# Patient Record
Sex: Male | Born: 1955 | Race: White | Hispanic: No | Marital: Married | Smoking: Former smoker
Health system: Southern US, Community
[De-identification: ages and names within clinical notes are randomized; demographics above are authoritative.]

## PROBLEM LIST (undated history)

## (undated) DIAGNOSIS — E785 Hyperlipidemia, unspecified: Secondary | ICD-10-CM

## (undated) DIAGNOSIS — C801 Malignant (primary) neoplasm, unspecified: Secondary | ICD-10-CM

## (undated) DIAGNOSIS — I10 Essential (primary) hypertension: Secondary | ICD-10-CM

## (undated) DIAGNOSIS — R2232 Localized swelling, mass and lump, left upper limb: Secondary | ICD-10-CM

## (undated) DIAGNOSIS — Z8551 Personal history of malignant neoplasm of bladder: Secondary | ICD-10-CM

## (undated) HISTORY — PX: WISDOM TOOTH EXTRACTION: SHX21

## (undated) HISTORY — PX: BLADDER SURGERY: SHX569

## (undated) HISTORY — DX: Hyperlipidemia, unspecified: E78.5

## (undated) HISTORY — PX: COLONOSCOPY: SHX174

## (undated) HISTORY — PX: CYSTECTOMY: SUR359

## (undated) HISTORY — PX: HERNIA REPAIR: SHX51

## (undated) NOTE — *Deleted (*Deleted)
Brian Benton presents today for follow-up after completing radiation to his pelvis on 04/13/2020  Pain: Fatigue: Appetite: *insert last 3 weights* Urinary: Bowel: Other issues of note: Met with Brian Benton (IR) to discuss left femoral DVT 04/30/2020 Plan: - CTA abdomen pelvis (venous protocol) to evaluate the pelvic venous anatomy - Repeat left leg duplex for DVT in the setting of severe and worsening post-thrombotic syndrome  -we will follow up with him once the imaging has been performed - continue current lovenox and anti-platelet strategy  *insert vitals*

## (undated) NOTE — *Deleted (*Deleted)
[facility] - Clinic Note  04/29/2020     CHIEF COMPLAINT Patient presents for No chief complaint on file.   HISTORY OF PRESENT ILLNESS: Brian Benton is a 58 y.o. male who presents to the clinic today for:   Patient states vision has improved some OS. Denies floaters, FOL. Increased dosage of cholesterol medication from 10 mg to 20mg . No side effects since increasing cholesterol medication. Sometimes forgets brimonidine gtt. (instructions to use bid)  Referring physician: Roseanna Rainbow, PA-C [address] [place],  [place] [address]  HISTORICAL INFORMATION:   Selected notes from the MEDICAL RECORD NUMBER Referred by [facility] for concern of BRAO LEE: 04.15.20 () [BCVA: OD: OS:] Ocular Hx- PMH-bladder cancer    CURRENT MEDICATIONS: No current outpatient medications on file. (Ophthalmic Drugs)   No current facility-administered medications for this visit. (Ophthalmic Drugs)   Current Outpatient Medications (Other)  Medication Sig  . ASPIRIN ADULT LOW STRENGTH 81 MG EC tablet TAKE 1 TABLET BY MOUTH EVERY DAY (Patient taking differently: Take 81 mg by mouth daily. )  . dexamethasone (DECADRON) 4 MG tablet Take two tablets (8 mg total) by mouth daily.  Take daily times three days starting the day after Cisplatin chemotherapy.  Take with food.  . enoxaparin (LOVENOX) 80 MG/0.8ML injection INJECT 0.8 MLS (80 MG TOTAL) INTO THE SKIN EVERY 12 (TWELVE) HOURS.  . fentaNYL (DURAGESIC) 25 MCG/HR Place 1 patch onto the skin every 3 (three) days.  Marland Kitchen gabapentin (NEURONTIN) 600 MG tablet Take 1 tablet (600 mg total) by mouth in the morning, at noon, in the evening, and at bedtime.  Marland Kitchen LORazepam (ATIVAN) 0.5 MG tablet Take 1 tablet (0.5 mg total) by mouth every 6 (six) hours as needed for anxiety.  . meloxicam (MOBIC) 15 MG tablet Take 1 tablet (15 mg total) by mouth daily.  . ondansetron (ZOFRAN) 8 MG tablet Take 1 tablet (8 mg total) by mouth 2 (two)  times daily as needed for nausea or vomiting. Start on third day after Cisplatin chemotherapy  . orphenadrine (NORFLEX) 100 MG tablet Take 1 tablet (100 mg total) by mouth 2 (two) times daily.  . Oxycodone HCl 10 MG TABS Take 1 tablet (10 mg total) by mouth every 4 (four) hours as needed.  . pantoprazole (PROTONIX) 40 MG tablet Take 1 tablet (40 mg total) by mouth daily.  . prochlorperazine (COMPAZINE) 10 MG tablet Take 1 tablet (10 mg total) by mouth every 6 (six) hours as needed for nausea or vomiting.  Marland Kitchen zolpidem (AMBIEN) 10 MG tablet TAKE 1 TABLET (10 MG TOTAL) BY MOUTH AT BEDTIME AS NEEDED FOR SLEEP.   No current facility-administered medications for this visit. (Other)      REVIEW OF SYSTEMS:    ALLERGIES No Known Allergies  PAST MEDICAL HISTORY Past Medical History:  Diagnosis Date  . Cancer (HCC)    bladder  . History of bladder cancer    s/p  TURBT 07-25-2014  . Hyperlipidemia   . Hypertension   . Mass of left hand   . 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 BLADDER SURGERY     [place]  . COLONOSCOPY    . CYSTECTOMY    . CYSTOSCOPY WITH BIOPSY N/A 07/25/2014   Procedure: CYSTOSCOPY WITH BLADDER BIOPSY AND FULGERATION;  Surgeon: Garnett Farm, MD;  Location[facility];  Service: Urology;  Laterality: N/A;  . CYSTOSCOPY WITH BIOPSY N/A 11/28/2014   Procedure: CYSTOSCOPY WITH  BLADDER BIOPSY;  Surgeon: Ihor Gully, MD;  Location: [facility];  Service: Urology;  Laterality: N/A;  . HEMORRHOID SURGERY  03/15/2012   Procedure: HEMORRHOIDECTOMY;  Surgeon: Clovis Pu. Cornett, MD;  Location: [facility];  Service: General;  Laterality: N/A;  . HERNIA REPAIR    . INGUINAL HERNIA REPAIR Bilateral 2006  . INTRAVASCULAR ULTRASOUND/IVUS N/A 03/13/2020   Procedure: Intravascular Ultrasound/IVUS;  Surgeon: Sherren Kerns, MD;  Location: [facility] INVASIVE CV LAB;  Service: Cardiovascular;   Laterality: N/A;  . IVC VENOGRAPHY N/A 03/13/2020   Procedure: IVC Venography;  Surgeon: Sherren Kerns, MD;  Location: [facility] INVASIVE CV LAB;  Service: Cardiovascular;  Laterality: N/A;  . LOWER EXTREMITY VENOGRAPHY Left 03/13/2020   Procedure: LOWER EXTREMITY VENOGRAPHY;  Surgeon: Sherren Kerns, MD;  Location: [facility] INVASIVE CV LAB;  Service: Cardiovascular;  Laterality: Left;  Marland Kitchen MASS EXCISION Left 11/12/2018   Procedure: EXCISION MASS LEFT HAND;  Surgeon: Cindee Salt, MD;  Location: [facility];  Service: Orthopedics;  Laterality: Left;  FAB  . PERIPHERAL VASCULAR INTERVENTION  03/13/2020   Procedure: PERIPHERAL VASCULAR INTERVENTION;  Surgeon: Sherren Kerns, MD;  Location[facility] INVASIVE CV LAB;  Service: Cardiovascular;;  . PERIPHERAL VASCULAR THROMBECTOMY N/A 03/13/2020   Procedure: PERIPHERAL VASCULAR THROMBECTOMY;  Surgeon: Sherren Kerns, MD;  Location: [facility] INVASIVE CV LAB;  Service: Cardiovascular;  Laterality: N/A;  . RADIOLOGY WITH ANESTHESIA N/A 02/20/2020   Procedure: MRI LUMBAR W/O CONTRAST  WITH ANESTHESIA;  Surgeon: Radiologist, Medication, MD;  Location: [facility];  Service: Radiology;  Laterality: N/A;  . TESTICLE SURGERY  2004   Ruptured Undescended Right testicle   . TRANSURETHRAL RESECTION OF BLADDER TUMOR WITH GYRUS (TURBT-GYRUS) N/A 05/23/2014   Procedure: TRANSURETHRAL RESECTION OF BLADDER TUMOR WITH GYRUS (TURBT-GYRUS);  Surgeon: Garnett Farm, MD;  Location: [facility];  Service: Urology;  Laterality: N/A;  . WISDOM TOOTH EXTRACTION      FAMILY HISTORY Family History  Problem Relation Age of Onset  . Cancer Mother        leukemia  . Diabetes Brother     SOCIAL HISTORY Social History   Tobacco Use  . Smoking status: Light Tobacco Smoker    Packs/day: 0.50    Years: 30.00    Pack years: 15.00    Types: Cigarettes    Last attempt to quit: 01/01/2019    Years since quitting: 1.3  . Smokeless tobacco: Never Used  Vaping Use  .  Vaping Use: Never used  Substance Use Topics  . Alcohol use: Not Currently    Comment: social  . Drug use: No         OPHTHALMIC EXAM:  Not recorded     IMAGING AND PROCEDURES  Imaging and Procedures for @TODAY @           ASSESSMENT/PLAN:    ICD-10-CM   1. 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 Retinal edema  H35.81   3. Essential hypertension  I10   4. Hypertensive retinopathy of both eyes  H35.033   5. Combined forms of age-related cataract of both eyes  H25.813   6. Visual field scotoma of left eye  H53.412     1,2. BRAO OS  - acute onset, Monday, 04.13.20, by pt history  - saw Dr. [place] on Wednesday, 04.15.20   - initial BCVA 20/40 with inferior  visual field loss OS  - on exam had mild whitening of superior macula with sparing of the fovea  - FA 4.17.2020 showed relatively good arterial perfusion, but delayed venous filling in ST quadrant  - HVF 24-2 today (02.17.21) shows inferior altitudinal defect--no significant change from prior (05.15.20)  - OCT shows stable improvement in inner-retinal hyperreflective material and interval progression of superior and temporal inner retinal atrophy (stable from prior)  - discussed findings and prognosis  - cardiovascular work up performed by cardiology -- echocardiogram and carotid dopplers on 5.20.20 -- wnl; 30 day event monitor placed 5.20.20 -- wnl  - optimization of cardiovascular risk factors with PCP and/or cardiology  - IOP OK at 10 mmHg  - decrease brimonidine to once daily OS  - f/u 6-9 months sooner prn -- DFE, OCT  3,4. Hypertensive retinopathy OU  - discussed importance of tight BP control  - monitor  5. Mixed form age related cataract  - The symptoms of cataract, surgical options, and treatments and risks were discussed with patient.  - discussed diagnosis and progression  - not yet visually significant  - monitor for now   Ophthalmic Meds Ordered this visit:  No orders of the  defined types were placed in this encounter.      No follow-ups on file.  There are no Patient Instructions on file for this visit.   Explained the diagnoses, plan, and follow up with the patient and they expressed understanding.  Patient expressed understanding of the importance of proper follow up care.   This document serves as a record of services personally performed by Karie Chimera, MD, PhD. It was created on their behalf by Annalee Genta, COMT. The creation of this record is the provider's dictation and/or activities during the visit.  Electronically signed by: Annalee Genta, COMT 04/27/20 1:39 PM  Karie Chimera, M.D., Ph.D. Diseases & Surgery of the Retina and Vitreous [facility] 07/31/2019     Abbreviations: M myopia (nearsighted); A astigmatism; H hyperopia (farsighted); P presbyopia; Mrx spectacle prescription;  CTL contact lenses; OD right eye; OS left eye; OU both eyes  XT exotropia; ET esotropia; PEK punctate epithelial keratitis; PEE punctate epithelial erosions; DES dry eye syndrome; MGD meibomian gland dysfunction; ATs artificial tears; PFAT's preservative free artificial tears; NSC nuclear sclerotic cataract; PSC posterior subcapsular cataract; ERM epi-retinal membrane; PVD posterior vitreous detachment; RD retinal detachment; DM diabetes mellitus; DR diabetic retinopathy; NPDR non-proliferative diabetic retinopathy; PDR proliferative diabetic retinopathy; CSME clinically significant macular edema; DME diabetic macular edema; dbh dot blot hemorrhages; CWS cotton wool spot; POAG primary open angle glaucoma; C/D cup-to-disc ratio; HVF humphrey visual field; GVF goldmann visual field; OCT optical coherence tomography; IOP intraocular pressure; BRVO Branch retinal vein occlusion; CRVO central retinal vein occlusion; CRAO central retinal artery occlusion; BRAO branch retinal artery occlusion; RT retinal tear; SB scleral buckle; PPV pars plana  vitrectomy; VH Vitreous hemorrhage; PRP panretinal laser photocoagulation; IVK intravitreal kenalog; VMT vitreomacular traction; MH Macular hole;  NVD neovascularization of the disc; NVE neovascularization elsewhere; AREDS age related eye disease study; ARMD age related macular degeneration; POAG primary open angle glaucoma; EBMD epithelial/anterior basement membrane dystrophy; ACIOL anterior chamber intraocular lens; IOL intraocular lens; PCIOL posterior chamber intraocular lens; Phaco/IOL phacoemulsification with intraocular lens placement; PRK photorefractive keratectomy; LASIK laser assisted in situ keratomileusis; HTN hypertension; DM diabetes mellitus; COPD chronic obstructive pulmonary disease

---
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
## 2002-06-13 HISTORY — PX: TESTICLE SURGERY: SHX794

## 2004-06-13 HISTORY — PX: INGUINAL HERNIA REPAIR: SUR1180

## 2007-02-21 ENCOUNTER — Ambulatory Visit: Payer: Self-pay | Admitting: Family Medicine

## 2007-02-21 DIAGNOSIS — Z720 Tobacco use: Secondary | ICD-10-CM | POA: Insufficient documentation

## 2007-02-21 LAB — CONVERTED CEMR LAB
BUN: 9 mg/dL (ref 6–23)
Basophils Relative: 0.1 % (ref 0.0–1.0)
Bilirubin, Direct: 0.1 mg/dL (ref 0.0–0.3)
CO2: 31 meq/L (ref 19–32)
Calcium: 9.4 mg/dL (ref 8.4–10.5)
Cholesterol: 270 mg/dL (ref 0–200)
Direct LDL: 236 mg/dL
Eosinophils Relative: 4.8 % (ref 0.0–5.0)
GFR calc Af Amer: 114 mL/min
GFR calc non Af Amer: 94 mL/min
Glucose, Bld: 82 mg/dL (ref 70–99)
Hemoglobin: 14.2 g/dL (ref 13.0–17.0)
Lymphocytes Relative: 17.3 % (ref 12.0–46.0)
Monocytes Relative: 4 % (ref 3.0–11.0)
Platelets: 190 10*3/uL (ref 150–400)
Potassium: 4.4 meq/L (ref 3.5–5.1)
RDW: 13.6 % (ref 11.5–14.6)
TSH: 1.65 microintl units/mL (ref 0.35–5.50)
Total CHOL/HDL Ratio: 14.5
Total Protein: 6.7 g/dL (ref 6.0–8.3)
VLDL: 31 mg/dL (ref 0–40)
WBC: 11.1 10*3/uL — ABNORMAL HIGH (ref 4.5–10.5)

## 2007-02-27 ENCOUNTER — Telehealth: Payer: Self-pay | Admitting: Family Medicine

## 2007-10-23 ENCOUNTER — Encounter: Payer: Self-pay | Admitting: Family Medicine

## 2007-10-23 ENCOUNTER — Ambulatory Visit: Payer: Self-pay | Admitting: Family Medicine

## 2007-10-23 DIAGNOSIS — D239 Other benign neoplasm of skin, unspecified: Secondary | ICD-10-CM | POA: Insufficient documentation

## 2011-12-22 ENCOUNTER — Encounter (INDEPENDENT_AMBULATORY_CARE_PROVIDER_SITE_OTHER): Payer: Self-pay | Admitting: Surgery

## 2011-12-22 ENCOUNTER — Ambulatory Visit (INDEPENDENT_AMBULATORY_CARE_PROVIDER_SITE_OTHER): Payer: BC Managed Care – PPO | Admitting: Surgery

## 2011-12-22 VITALS — BP 136/80 | HR 72 | Temp 98.0°F | Resp 16 | Ht 72.0 in | Wt 174.6 lb

## 2011-12-22 DIAGNOSIS — K642 Third degree hemorrhoids: Secondary | ICD-10-CM

## 2011-12-22 DIAGNOSIS — K649 Unspecified hemorrhoids: Secondary | ICD-10-CM

## 2011-12-22 NOTE — Patient Instructions (Signed)
Hemorrhoidectomy Care After Hemorrhoidectomy is the removal of enlarged (dilated) veins around the rectum. Until the surgical areas are healed, control of pain and avoiding constipation are the greatest challenges for patients.  For as long as 24 hours after receiving an anesthetic (the medication that made you sleep), and while taking narcotic pain relievers, you may feel dizzy, weak and drowsy. For that reason, the following information applies to the first 24-hour period following surgery, and continues for as long as you are taking narcotic pain medications.  Do not drive a car, ride a bicycle, participate in activities in which you could be hurt. Do not take public transportation until you are off narcotic pain medications and until your caregiver says it is okay.   Do not drink alcohol, take tranquilizers, or medications not prescribed or allowed by your surgical caregiver.   Do not sign important papers or contracts for at least 24 hours or while taking narcotic medications.   Have a responsible person with you for 24 hours.  RISKS AND COMPLICATIONS Some problems that may occur following this procedure include:  Infection. A germ starts growing in the tissue surrounding the site operated on. This can usually be treated with antibiotics.   Damage to the rectal sphincter could occur. This is the muscle that opens in your anus to allow a bowel movement. This could cause incontinence. This is uncommon.   Bleeding following surgery can be a complication of almost any surgery. Your surgeon takes every precaution to keep this from happening.   Complications of anesthesia.  HOME CARE INSTRUCTIONS  Avoid straining when having bowel movements.   Avoid heavy lifting (more than 10 pounds (4.5 kilograms)).   Only take over-the-counter or prescription medicines for pain, discomfort, or fever as directed by your caregiver.   Take hot sitz baths for 20 to 30 minutes, 3 to 4 times per day.   To  keep swelling down, apply an ice pack for twenty minutes three to four times per day between sitz baths. Use a towel between your skin and the ice pack. Do not do this if it causes too much discomfort.   Keep anal area clean and dry. Following a bowel movement, you can gently wash the area with tucks (available for purchase at a drugstore) or cotton swabs. Gently pat the area dry. Do not rub the area.   Eat a well balanced diet and drink 6 to 8 glasses of water every day to avoid constipation. A bulk laxative may be also be helpful.  SEEK MEDICAL CARE IF:   You have increasing pain or tenderness near or in the surgical site.   You are unable to eat or drink.   You develop nausea or vomiting.   You develop uncontrolled bleeding such as soaking two to three pads in one hour.   You have constipation, not helped by changing your diet or increasing your fluid intake. Pain medications are a common cause of constipation.   You have pain and redness (inflammation) extending outside the area of your surgery.   You develop an unexplained oral temperature above 102 F (38.9 C), or any other signs of infection.   You have any other questions or concerns following surgery.  Document Released: 08/20/2003 Document Revised: 05/19/2011 Document Reviewed: 11/17/2008 Exit[employer] Patient Information 2012 [place], [facility]   Hemorrhoidectomy Hemorrhoidectomy is surgery to remove hemorrhoids. Hemorrhoids are veins that have become swollen in the rectum. The rectum is the area from the bottom end of the intestines  to the opening where bowel movements leave the body. Hemorrhoids can be uncomfortable. They can cause itching, bleeding and pain if a blood clot forms in them (thrombose). If hemorrhoids are small, surgery may not be needed. But if they cover a larger area, surgery is usually suggested.  LET YOUR CAREGIVER KNOW ABOUT:  Any allergies.  All medications you are taking, including:  Herbs, eyedrops,  over-the-counter medications and creams.  Blood thinners (anticoagulants), aspirin or other drugs that could affect blood clotting.  Use of steroids (by mouth or as creams).  Previous problems with anesthetics, including local anesthetics.  Possibility of pregnancy, if this applies.  Any history of blood clots.  Any history of bleeding or other blood problems.  Previous surgery.  Smoking history.  Other health problems.  RISKS AND COMPLICATIONS All surgery carries some risk. However, hemorrhoid surgery usually goes smoothly. Possible complications could include: Urinary retention.  Bleeding.  Infection.  A painful incision.  A reaction to the anesthesia (this is not common).  BEFORE THE PROCEDURE  Stop using aspirin and non-steroidal anti-inflammatory drugs (NSAIDs) for pain relief. This includes prescription drugs and over-the-counter drugs such as ibuprofen and naproxen. Also stop taking vitamin E. If possible, do this two weeks before your surgery.  If you take blood-thinners, ask your healthcare provider when you should stop taking them.  You will probably have blood and urine tests done several days before your surgery.  Do not eat or drink for about 8 hours before the surgery.  Arrive at least an hour before the surgery, or whenever your surgeon recommends. This will give you time to check in and fill out any needed paperwork.  Hemorrhoidectomy is often an outpatient procedure. This means you will be able to go home the same day. Sometimes, though, people stay overnight in the hospital after the procedure. Ask your surgeon what to expect. Either way, make arrangements in advance for someone to drive you home.  PROCEDURE  The preparation:  You will change into a hospital gown.  You will be given an IV. A needle will be inserted in your arm. Medication can flow directly into your body through this needle.  You might be given an enema to clear your rectum.  Once in the operating room,  you will probably lie on your side or be repositioned later to lying on your stomach.  You will be given anesthesia (medication) so you will not feel anything during the surgery. The surgery often is done with local anesthesia (the area near the hemorrhoids will be numb and you will be drowsy but awake). Sometimes, general anesthesia is used (you will be asleep during the procedure).  The procedure:  There are a few different procedures for hemorrhoids. Be sure to ask you surgeon about the procedure, the risks and benefits.  Be sure to ask about what you need to do to take care of the wound, if there is one.  AFTER THE PROCEDURE You will stay in a recovery area until the anesthesia has worn off. Your blood pressure and pulse will be checked every so often.  You may feel a lot of pain in the area of the rectum.  Take all pain medication prescribed by your surgeon. Ask before taking any over-the-counter pain medicines.  Sometimes sitting in a warm bath can help relieve your pain.  To make sure you have bowel movements without straining:  You will probably need to take stool softeners (usually a pill) for a few days.  You should drink 8 to 10 glasses of water each day.  Your activity will be restricted for awhile. Ask your caregiver for a list of what you should and should not do while you recover.  Document Released: 03/27/2009 Document Revised: 05/19/2011 Document Reviewed: 03/27/2009 [facility] Patient Information 2012 [place], [place].    Marland KitchenHemorrhoids Hemorrhoids are enlarged (dilated) veins around the rectum. There are 2 types of hemorrhoids, and the type of hemorrhoid is determined by its location. Internal hemorrhoids occur in the veins just inside the rectum.They are usually not painful, but they may bleed.However, they may poke through to the outside and become irritated and painful. External hemorrhoids involve the veins outside the anus and can be felt as a painful swelling or hard lump  near the anus.They are often itchy and may crack and bleed. Sometimes clots will form in the veins. This makes them swollen and painful. These are called thrombosed hemorrhoids. CAUSES Causes of hemorrhoids include:  Pregnancy. This increases the pressure in the hemorrhoidal veins.   Constipation.   Straining to have a bowel movement.   Obesity.   Heavy lifting or other activity that caused you to strain.  TREATMENT Most of the time hemorrhoids improve in 1 to 2 weeks. However, if symptoms do not seem to be getting better or if you have a lot of rectal bleeding, your caregiver may perform a procedure to help make the hemorrhoids get smaller or remove them completely.Possible treatments include:  Rubber band ligation. A rubber band is placed at the base of the hemorrhoid to cut off the circulation.   Sclerotherapy. A chemical is injected to shrink the hemorrhoid.   Infrared light therapy. Tools are used to burn the hemorrhoid.   Hemorrhoidectomy. This is surgical removal of the hemorrhoid.  HOME CARE INSTRUCTIONS   Increase fiber in your diet. Ask your caregiver about using fiber supplements.   Drink enough water and fluids to keep your urine clear or pale yellow.   Exercise regularly.   Go to the bathroom when you have the urge to have a bowel movement. Do not wait.   Avoid straining to have bowel movements.   Keep the anal area dry and clean.   Only take over-the-counter or prescription medicines for pain, discomfort, or fever as directed by your caregiver.  If your hemorrhoids are thrombosed:  Take warm sitz baths for 20 to 30 minutes, 3 to 4 times per day.   If the hemorrhoids are very tender and swollen, place ice packs on the area as tolerated. Using ice packs between sitz baths may be helpful. Fill a plastic bag with ice. Place a towel between the bag of ice and your skin.   Medicated creams and suppositories may be used or applied as directed.   Do not use a  donut-shaped pillow or sit on the toilet for long periods. This increases blood pooling and pain.  SEEK MEDICAL CARE IF:   You have increasing pain and swelling that is not controlled with your medicine.   You have uncontrolled bleeding.   You have difficulty or you are unable to have a bowel movement.   You have pain or inflammation outside the area of the hemorrhoids.   You have chills or an oral temperature above 102 F (38.9 C).  MAKE SURE YOU:   Understand these instructions.   Will watch your condition.   Will get help right away if you are not doing well or get worse.  Document Released: 05/27/2000 Document  Revised: 05/19/2011 Document Reviewed: 10/02/2007 [facility] Patient Information 2012 [place], [place].

## 2011-12-22 NOTE — Progress Notes (Signed)
Patient ID: Brian Benton, male   DOB: 1956-02-28, 56 y.o.   MRN: [id number]  Chief Complaint  Patient presents with  . Hemorrhoids    new pt    [name].   HPIPatient sent at the request of Dr. Tawanna Cooler due to rectal bleeding, itching and soiling. He has history of hemorrhoids. Symptoms have been increasing last month. He denies any history of constipation or excessive straining. Bleeding is intermittent itching is becoming worse. He tried some topical treatments. These have helped a little.  History reviewed. No pertinent past medical history.  Past Surgical History  Procedure Date  . Hernia repair     BIH    Family History  Problem Relation Age of Onset  . Cancer Mother     leukemia    Social History History  Substance Use Topics  . Smoking status: Current Everyday Smoker -- 1.0 packs/day  . Smokeless tobacco: Not on file  . Alcohol Use: Yes    No Known Allergies  Current Outpatient Prescriptions  Medication Sig Dispense Refill  . HEMRIL-30 30 MG SUPP         Review of Systems Review of Systems  Constitutional: Negative for fever, chills and unexpected weight change.  HENT: Negative for hearing loss, congestion, sore throat, trouble swallowing and voice change.   Eyes: Negative for visual disturbance.  Respiratory: Negative for cough and wheezing.   Cardiovascular: Negative for chest pain, palpitations and leg swelling.  Gastrointestinal: Positive for anal bleeding and rectal pain. Negative for nausea, vomiting, abdominal pain, diarrhea, constipation, blood in stool and abdominal distention.  Genitourinary: Negative for hematuria and difficulty urinating.  Musculoskeletal: Negative for arthralgias.  Skin: Negative for rash and wound.  Neurological: Negative for seizures, syncope, weakness and headaches.  Hematological: Negative for adenopathy. Does not bruise/bleed easily.  Psychiatric/Behavioral: Negative for confusion.    Blood  pressure 136/80, pulse 72, temperature 98 F (36.7 C), temperature source Temporal, resp. rate 16, height 6' (1.829 m), weight 174 lb 9.6 oz (79.198 kg).  Physical Exam Physical Exam  Constitutional: He is oriented to person, place, and time. He appears well-developed and well-nourished.  HENT:  Head: Normocephalic and atraumatic.  Eyes: EOM are normal. Pupils are equal, round, and reactive to light.  Neck: Normal range of motion. Neck supple.  Cardiovascular: Normal rate and regular rhythm.   Pulmonary/Chest: Effort normal and breath sounds normal.  Genitourinary: Rectal exam shows external hemorrhoid and internal hemorrhoid.     Musculoskeletal: Normal range of motion.  Neurological: He is alert and oriented to person, place, and time.  Skin: Skin is warm and dry.  Psychiatric: He has a normal mood and affect. His behavior is normal. Judgment and thought content normal.      Assessment    A 3 column grade 3 prolapsed internal and extra hemorrhoids.    Plan    The patient is not a candidate for office place procedures nor medical management. Recommend hemorrhoidectomy since he is quite symptomatic hemorrhoids are quite large. He does have a large external component as well. Risk of bleeding, infection, anal stenosis, additional surgery, damage to neighboring structures, sensation deficit, postoperative pain to be severe, and other procedures discussed. He wishes to proceed.       Jawaun Celmer A. 12/22/2011, 4:21 PM

## 2012-03-06 ENCOUNTER — Encounter (HOSPITAL_COMMUNITY): Payer: Self-pay | Admitting: Pharmacy Technician

## 2012-03-06 ENCOUNTER — Encounter (HOSPITAL_COMMUNITY): Payer: Self-pay | Admitting: *Deleted

## 2012-03-06 NOTE — Progress Notes (Signed)
03-06-12 Instructed to use Chlorhexidine 4% soap x 2 days prior to surgery at bedtime, use in Am of if desired(avoid face and private area). Use set clean linens on bed first night of showering. Wear clean clothes AM of loose fitting. Have responsible driver and person X 24 hours to be with you once home. W. Kennon Portela

## 2012-03-15 ENCOUNTER — Encounter (HOSPITAL_COMMUNITY): Payer: Self-pay | Admitting: Anesthesiology

## 2012-03-15 ENCOUNTER — Encounter (HOSPITAL_COMMUNITY): Payer: Self-pay | Admitting: *Deleted

## 2012-03-15 ENCOUNTER — Ambulatory Visit (HOSPITAL_COMMUNITY): Payer: BC Managed Care – PPO

## 2012-03-15 ENCOUNTER — Encounter (HOSPITAL_COMMUNITY)
Admission: RE | Disposition: A | Discharge status: Home / Self Care | Payer: Self-pay | Source: Ambulatory Visit | Attending: Surgery

## 2012-03-15 ENCOUNTER — Ambulatory Visit (HOSPITAL_COMMUNITY)
Admission: RE | Admit: 2012-03-15 | Discharge: 2012-03-15 | Disposition: A | Discharge status: Home / Self Care | Payer: BC Managed Care – PPO | Source: Ambulatory Visit | Attending: Surgery | Admitting: Surgery

## 2012-03-15 ENCOUNTER — Encounter (HOSPITAL_COMMUNITY): Payer: Self-pay | Admitting: Surgery

## 2012-03-15 ENCOUNTER — Ambulatory Visit (HOSPITAL_COMMUNITY): Payer: BC Managed Care – PPO | Admitting: Anesthesiology

## 2012-03-15 DIAGNOSIS — K648 Other hemorrhoids: Secondary | ICD-10-CM | POA: Insufficient documentation

## 2012-03-15 DIAGNOSIS — D239 Other benign neoplasm of skin, unspecified: Secondary | ICD-10-CM

## 2012-03-15 DIAGNOSIS — F17200 Nicotine dependence, unspecified, uncomplicated: Secondary | ICD-10-CM

## 2012-03-15 DIAGNOSIS — K644 Residual hemorrhoidal skin tags: Secondary | ICD-10-CM

## 2012-03-15 HISTORY — PX: HEMORRHOID SURGERY: SHX153

## 2012-03-15 LAB — DIFFERENTIAL
Basophils Absolute: 0.1 10*3/uL (ref 0.0–0.1)
Basophils Relative: 0 % (ref 0–1)
Monocytes Relative: 7 % (ref 3–12)
Neutro Abs: 7.7 10*3/uL (ref 1.7–7.7)
Neutrophils Relative %: 69 % (ref 43–77)

## 2012-03-15 LAB — COMPREHENSIVE METABOLIC PANEL
Albumin: 3.5 g/dL (ref 3.5–5.2)
BUN: 11 mg/dL (ref 6–23)
Chloride: 104 mEq/L (ref 96–112)
Creatinine, Ser: 0.82 mg/dL (ref 0.50–1.35)
GFR calc Af Amer: >90 mL/min (ref >90)
GFR calc non Af Amer: >90 mL/min (ref >90)
Glucose, Bld: 99 mg/dL (ref 70–99)
Total Bilirubin: 0.4 mg/dL (ref 0.3–1.2)

## 2012-03-15 LAB — CBC
HCT: 42.7 % (ref 39.0–52.0)
Hemoglobin: 14.9 g/dL (ref 13.0–17.0)
MCV: 92.4 fL (ref 78.0–100.0)
RDW: 14.5 % (ref 11.5–15.5)
WBC: 11.2 10*3/uL — ABNORMAL HIGH (ref 4.0–10.5)

## 2012-03-15 SURGERY — HEMORRHOIDECTOMY
Anesthesia: General | Wound class: Dirty or Infected

## 2012-03-15 MED ORDER — OXYCODONE HCL 5 MG/5ML PO SOLN
5.0000 mg | Freq: Once | ORAL | Status: DC | PRN
  Filled 2012-03-15: qty 5

## 2012-03-15 MED ORDER — THROMBIN 5000 UNITS EX SOLR
CUTANEOUS | Status: AC
  Filled 2012-03-15: qty 20000

## 2012-03-15 MED ORDER — CEFAZOLIN SODIUM-DEXTROSE 2-3 GM-% IV SOLR
INTRAVENOUS | Status: AC
  Filled 2012-03-15: qty 50

## 2012-03-15 MED ORDER — BUPIVACAINE-EPINEPHRINE 0.25% -1:200000 IJ SOLN
INTRAMUSCULAR | Status: DC | PRN
  Administered 2012-03-15: 30 mL

## 2012-03-15 MED ORDER — HYDROCODONE-IBUPROFEN 7.5-200 MG PO TABS: 2.0000 | ORAL_TABLET | ORAL | Status: DC | PRN

## 2012-03-15 MED ORDER — LIDOCAINE HCL (CARDIAC) 20 MG/ML IV SOLN
INTRAVENOUS | Status: DC | PRN
  Administered 2012-03-15: 100 mg via INTRAVENOUS

## 2012-03-15 MED ORDER — HYDROMORPHONE HCL PF 1 MG/ML IJ SOLN
INTRAMUSCULAR | Status: AC
  Filled 2012-03-15: qty 1

## 2012-03-15 MED ORDER — LACTATED RINGERS IV SOLN
INTRAVENOUS | Status: DC | PRN
  Administered 2012-03-15: 07:00:00 via INTRAVENOUS

## 2012-03-15 MED ORDER — CEFAZOLIN SODIUM-DEXTROSE 2-3 GM-% IV SOLR
2.0000 g | Freq: Once | INTRAVENOUS | Status: AC
  Administered 2012-03-15: 2 g via INTRAVENOUS

## 2012-03-15 MED ORDER — DEXTROSE 5 % IV SOLN
3.0000 g | INTRAVENOUS | Status: DC
  Filled 2012-03-15: qty 3000

## 2012-03-15 MED ORDER — ACETAMINOPHEN 10 MG/ML IV SOLN: 1000.0000 mg | Freq: Once | INTRAVENOUS | Status: DC | PRN

## 2012-03-15 MED ORDER — POLYETHYLENE GLYCOL 3350 17 GM/SCOOP PO POWD: 17.0000 g | Freq: Every day | ORAL | Status: DC

## 2012-03-15 MED ORDER — LIDOCAINE HCL 2 % EX GEL
CUTANEOUS | Status: AC
  Filled 2012-03-15: qty 10

## 2012-03-15 MED ORDER — ACETAMINOPHEN 10 MG/ML IV SOLN
INTRAVENOUS | Status: DC | PRN
  Administered 2012-03-15: 1000 mg via INTRAVENOUS

## 2012-03-15 MED ORDER — PROPOFOL 10 MG/ML IV BOLUS
INTRAVENOUS | Status: DC | PRN
  Administered 2012-03-15: 100 mg via INTRAVENOUS
  Administered 2012-03-15: 300 mg via INTRAVENOUS

## 2012-03-15 MED ORDER — BUPIVACAINE LIPOSOME 1.3 % IJ SUSP
20.0000 mL | Freq: Once | INTRAMUSCULAR | Status: DC
  Filled 2012-03-15: qty 20

## 2012-03-15 MED ORDER — OXYCODONE HCL 5 MG PO TABS: 5.0000 mg | ORAL_TABLET | Freq: Once | ORAL | Status: DC | PRN

## 2012-03-15 MED ORDER — MEPERIDINE HCL 50 MG/ML IJ SOLN: 6.2500 mg | INTRAMUSCULAR | Status: DC | PRN

## 2012-03-15 MED ORDER — 0.9 % SODIUM CHLORIDE (POUR BTL) OPTIME
TOPICAL | Status: DC | PRN
  Administered 2012-03-15: 1000 mL

## 2012-03-15 MED ORDER — PROMETHAZINE HCL 25 MG/ML IJ SOLN: 6.2500 mg | INTRAMUSCULAR | Status: DC | PRN

## 2012-03-15 MED ORDER — BUPIVACAINE LIPOSOME 1.3 % IJ SUSP
INTRAMUSCULAR | Status: DC | PRN
  Administered 2012-03-15: 20 mL

## 2012-03-15 MED ORDER — SODIUM CHLORIDE 0.9 % IJ SOLN
INTRAMUSCULAR | Status: DC | PRN
  Administered 2012-03-15: 20 mL via INTRAVENOUS

## 2012-03-15 MED ORDER — LIDOCAINE HCL 2 % EX GEL: CUTANEOUS | Status: DC | PRN

## 2012-03-15 MED ORDER — BUPIVACAINE-EPINEPHRINE PF 0.25-1:200000 % IJ SOLN
INTRAMUSCULAR | Status: AC
  Filled 2012-03-15: qty 30

## 2012-03-15 MED ORDER — FENTANYL CITRATE 0.05 MG/ML IJ SOLN
INTRAMUSCULAR | Status: DC | PRN
  Administered 2012-03-15 (×2): 50 ug via INTRAVENOUS

## 2012-03-15 MED ORDER — MIDAZOLAM HCL 5 MG/5ML IJ SOLN
INTRAMUSCULAR | Status: DC | PRN
  Administered 2012-03-15: 2 mg via INTRAVENOUS

## 2012-03-15 MED ORDER — ACETAMINOPHEN 10 MG/ML IV SOLN
INTRAVENOUS | Status: AC
  Filled 2012-03-15: qty 100

## 2012-03-15 MED ORDER — MUPIROCIN 2 % EX OINT
TOPICAL_OINTMENT | Freq: Two times a day (BID) | CUTANEOUS | Status: DC
  Administered 2012-03-15: 1 via NASAL
  Filled 2012-03-15 (×2): qty 22

## 2012-03-15 MED ORDER — FLEET ENEMA 7-19 GM/118ML RE ENEM
1.0000 | ENEMA | Freq: Once | RECTAL | Status: AC
  Administered 2012-03-15: 1 via RECTAL
  Filled 2012-03-15: qty 1

## 2012-03-15 MED ORDER — HYDROMORPHONE HCL PF 1 MG/ML IJ SOLN
0.2500 mg | INTRAMUSCULAR | Status: DC | PRN
  Administered 2012-03-15 (×4): 0.5 mg via INTRAVENOUS

## 2012-03-15 SURGICAL SUPPLY — 39 items
BLADE HEX COATED 2.75 (ELECTRODE) ×2 IMPLANT
BLADE SURG 15 STRL LF DISP TIS (BLADE) ×1 IMPLANT
BLADE SURG 15 STRL SS (BLADE) ×1
CANISTER SUCTION 2500CC (MISCELLANEOUS) ×2 IMPLANT
CLOTH BEACON ORANGE TIMEOUT ST (SAFETY) ×2 IMPLANT
DECANTER SPIKE VIAL GLASS SM (MISCELLANEOUS) ×2 IMPLANT
DRAPE LG THREE QUARTER DISP (DRAPES) ×2 IMPLANT
DRSG PAD ABDOMINAL 8X10 ST (GAUZE/BANDAGES/DRESSINGS) IMPLANT
ELECT REM PT RETURN 9FT ADLT (ELECTROSURGICAL) ×2
ELECTRODE REM PT RTRN 9FT ADLT (ELECTROSURGICAL) ×1 IMPLANT
GAUZE SPONGE 4X4 16PLY XRAY LF (GAUZE/BANDAGES/DRESSINGS) ×2 IMPLANT
GAUZE VASELINE 3X9 (GAUZE/BANDAGES/DRESSINGS) IMPLANT
GLOVE BIOGEL PI IND STRL 7.0 (GLOVE) ×1 IMPLANT
GLOVE BIOGEL PI INDICATOR 7.0 (GLOVE) ×1
GLOVE INDICATOR 8.0 STRL GRN (GLOVE) ×4 IMPLANT
GLOVE SS BIOGEL STRL SZ 8 (GLOVE) ×1 IMPLANT
GLOVE SUPERSENSE BIOGEL SZ 8 (GLOVE) ×1
GOWN STRL NON-REIN LRG LVL3 (GOWN DISPOSABLE) ×2 IMPLANT
GOWN STRL REIN XL XLG (GOWN DISPOSABLE) ×4 IMPLANT
KIT BASIN OR (CUSTOM PROCEDURE TRAY) ×2 IMPLANT
LUBRICANT JELLY K Y 4OZ (MISCELLANEOUS) ×2 IMPLANT
NDL SAFETY ECLIPSE 18X1.5 (NEEDLE) IMPLANT
NEEDLE HYPO 18GX1.5 SHARP (NEEDLE)
NEEDLE HYPO 25X1 1.5 SAFETY (NEEDLE) ×2 IMPLANT
NS IRRIG 1000ML POUR BTL (IV SOLUTION) ×2 IMPLANT
PACK LITHOTOMY IV (CUSTOM PROCEDURE TRAY) ×2 IMPLANT
PENCIL BUTTON HOLSTER BLD 10FT (ELECTRODE) ×2 IMPLANT
SHEARS HARMONIC 9CM CVD (BLADE) ×2 IMPLANT
SPONGE GAUZE 4X4 12PLY (GAUZE/BANDAGES/DRESSINGS) ×2 IMPLANT
SPONGE SURGIFOAM ABS GEL 100 (HEMOSTASIS) ×2 IMPLANT
SPONGE SURGIFOAM ABS GEL 12-7 (HEMOSTASIS) IMPLANT
SUT CHROMIC 2 0 SH (SUTURE) IMPLANT
SUT CHROMIC 3 0 SH 27 (SUTURE) IMPLANT
SUT MON AB 3-0 SH 27 (SUTURE) ×3
SUT MON AB 3-0 SH27 (SUTURE) ×3 IMPLANT
SUT VIC AB 4-0 P2 18 (SUTURE) ×2 IMPLANT
SYR CONTROL 10ML LL (SYRINGE) ×2 IMPLANT
TOWEL OR 17X26 10 PK STRL BLUE (TOWEL DISPOSABLE) ×2 IMPLANT
YANKAUER SUCT BULB TIP 10FT TU (MISCELLANEOUS) ×2 IMPLANT

## 2012-03-15 NOTE — Anesthesia Postprocedure Evaluation (Signed)
Anesthesia Post Note  Patient: Brian Benton  Procedure(s) Performed: Procedure(s) (LRB): HEMORRHOIDECTOMY (N/A)  Anesthesia type: General  Patient location: PACU  Post pain: Pain level controlled  Post assessment: Post-op Vital signs reviewed  Last Vitals: BP 132/78  Pulse 65  Temp 36.6 C (Oral)  Resp 12  Ht 6' (1.829 m)  Wt 174 lb 8 oz (79.153 kg)  BMI 23.67 kg/m2  SpO2 99%  Post vital signs: Reviewed  Level of consciousness: sedated  Complications: No apparent anesthesia complications

## 2012-03-15 NOTE — Transfer of Care (Signed)
Immediate Anesthesia Transfer of Care Note  Patient: Brian Benton  Procedure(s) Performed: Procedure(s) (LRB) with comments: HEMORRHOIDECTOMY (N/A)  Patient Location: PACU  Anesthesia Type: General  Level of Consciousness: awake and oriented  Airway & Oxygen Therapy: Patient Spontanous Breathing and Patient connected to face mask oxygen  Post-op Assessment: Report given to PACU RN and Post -op Vital signs reviewed and stable  Post vital signs: Reviewed and stable  Complications: No apparent anesthesia complications

## 2012-03-15 NOTE — Interval H&P Note (Signed)
History and Physical Interval Note:  03/15/2012 7:18 AM  Brian Benton  has presented today for surgery, with the diagnosis of 3  column Int/external hemorrhoid  The various methods of treatment have been discussed with the patient and family. After consideration of risks, benefits and other options for treatment, the patient has consented to  Procedure(s) (LRB) with comments: HEMORRHOIDECTOMY (N/A) as a surgical intervention .  The patient's history has been reviewed, patient examined, no change in status, stable for surgery.  I have reviewed the patient's chart and labs.  Questions were answered to the patient's satisfaction.     Chandlar Guice A.

## 2012-03-15 NOTE — Op Note (Signed)
Preoperative diagnosis: 3 column internal and external hemorrhoid disease prolapse  Postop diagnosis: Same  Procedure: 3 column internal and external hemorrhoidectomy  Surgeon: Harriette Bouillon M.D.  Anesthesia: Gen. With Exparel 20 cc/20cc  EBL: 30 cc  Specimen: Hemorrhoidal tissue to pathology  Drain: None  IV fluids: Thousand cc crystalloid  Indications for procedure: The patient presents due to symptomatic hemorrhoid disease. He has been managed medically and has failed. His hemorrhoids are too large for office-based procedures. Risks, benefits and alternative therapies discussed. He has significant external component therefore staple hemorrhoidectomy was not felt to the appropriate. After discussion of the above and the long term expectations of hemorrhoidectomy and postoperative care issues he was to proceed.  Description of procedure: The patient was met in the holding area and questions were answered. She's taken back to the operating room and placed upon the operating room table where general anesthesia was initiated. He was placed lithotomy and padded. The anal canal was prepped and draped in a sterile fashion. Timeout was done. He received 2 g of Ancef. Digital examination was done. Rectal tone normal. Anoscope was placed in the left lateral hemorrhoid complex was excised with harmonic scalpel. Mucosa was oversewn with 3-0 Monocryl leaving the bottom open for drainage. The right posterior column was excised in a similar fashion and oversewn with 3-0 Monocryl. The right anterior column with small removed with harmonic scalpel. Hemostasis was achieved. There is room for 2 fingers to pass through the anal canal. Exparel was used as anal block. Gelfoam was in place as anal plug. All final counts are found to be correct. The patient was taken out of  lithotomy extubated taken recovery in satisfactory condition

## 2012-03-15 NOTE — Preoperative (Signed)
Beta Blockers   Reason not to administer Beta Blockers:Not Applicable 

## 2012-03-15 NOTE — Anesthesia Preprocedure Evaluation (Addendum)
Anesthesia Evaluation  Patient identified by MRN, date of birth, ID band Patient awake    Reviewed: Allergy & Precautions, H&P , NPO status , Patient's Chart, lab work & pertinent test results  Airway Mallampati: I TM Distance: >3 FB Neck ROM: Full    Dental  (+) Teeth Intact and Dental Advisory Given   Pulmonary Current Smoker,  breath sounds clear to auscultation  Pulmonary exam normal       Cardiovascular Exercise Tolerance: Good - CAD, - Past MI and - CHF Rhythm:Regular Rate:Normal     Neuro/Psych negative neurological ROS     GI/Hepatic negative GI ROS, Neg liver ROS,   Endo/Other  negative endocrine ROS  Renal/GU negative Renal ROS     Musculoskeletal negative musculoskeletal ROS (+)   Abdominal   Peds  Hematology negative hematology ROS (+)   Anesthesia Other Findings   Reproductive/Obstetrics                          Anesthesia Physical Anesthesia Plan  ASA: II  Anesthesia Plan: General   Post-op Pain Management:    Induction:   Airway Management Planned: LMA  Additional Equipment:   Intra-op Plan:   Post-operative Plan: Extubation in OR  Informed Consent: I have reviewed the patients History and Physical, chart, labs and discussed the procedure including the risks, benefits and alternatives for the proposed anesthesia with the patient or authorized representative who has indicated his/her understanding and acceptance.   Dental advisory given  Plan Discussed with: CRNA  Anesthesia Plan Comments:         Anesthesia Quick Evaluation

## 2012-03-15 NOTE — H&P (Signed)
Brian Benton   MRN: [id number]   Description: 56 year old male  Provider: Dortha Schwalbe., MD  Department: [facility]        Diagnoses     Hemorrhoids that prolapse with straining and require manual replacement back inside anal canal   - Primary    455.8      Reason for Visit     Hemorrhoids    new pt        Vitals - Last Recorded       BP Pulse Temp Resp Ht Wt    136/80 72 98 F (36.7 C) (Temporal) 16 6' (1.829 m) 174 lb 9.6 oz (79.198 kg)         BMI              23.68 kg/m2                 Progress Notes   Patient ID: Brian Benton, male   DOB: [birth year], 56 y.o.   MRN: [id number]    Chief Complaint   Patient presents with   .  Hemorrhoids       new pt      [name].   HPIPatient sent at the request of Dr. Tawanna Cooler due to rectal bleeding, itching and soiling. He has history of hemorrhoids. Symptoms have been increasing last month. He denies any history of constipation or excessive straining. Bleeding is intermittent itching is becoming worse. He tried some topical treatments. These have helped a little.   History reviewed. No pertinent past medical history.    Past Surgical History   Procedure  Date   .  Hernia repair         BIH       Family History   Problem  Relation  Age of Onset   .  Cancer  Mother         leukemia      Social History History   Substance Use Topics   .  Smoking status:  Current Everyday Smoker -- 1.0 packs/day   .  Smokeless tobacco:  Not on file   .  Alcohol Use:  Yes      No Known Allergies    Current Outpatient Prescriptions   Medication  Sig  Dispense  Refill   .  HEMRIL-30 30 MG SUPP              Review of Systems Review of Systems  Constitutional: Negative for fever, chills and unexpected weight change.  HENT: Negative for hearing loss, congestion, sore throat, trouble swallowing and voice change.   Eyes: Negative for visual disturbance.  Respiratory: Negative for  cough and wheezing.   Cardiovascular: Negative for chest pain, palpitations and leg swelling.  Gastrointestinal: Positive for anal bleeding and rectal pain. Negative for nausea, vomiting, abdominal pain, diarrhea, constipation, blood in stool and abdominal distention.  Genitourinary: Negative for hematuria and difficulty urinating.  Musculoskeletal: Negative for arthralgias.  Skin: Negative for rash and wound.  Neurological: Negative for seizures, syncope, weakness and headaches.  Hematological: Negative for adenopathy. Does not bruise/bleed easily.  Psychiatric/Behavioral: Negative for confusion.    Blood pressure 136/80, pulse 72, temperature 98 F (36.7 C), temperature source Temporal, resp. rate 16, height 6' (1.829 m), weight 174 lb 9.6 oz (79.198 kg).   Physical Exam Physical Exam  Constitutional: He is oriented to person, place, and time. He appears well-developed and well-nourished.  HENT:   Head:  Normocephalic and atraumatic.  Eyes: EOM are normal. Pupils are equal, round, and reactive to light.  Neck: Normal range of motion. Neck supple.  Cardiovascular: Normal rate and regular rhythm.   Pulmonary/Chest: Effort normal and breath sounds normal.  Genitourinary: Rectal exam shows external hemorrhoid and internal hemorrhoid.     Musculoskeletal: Normal range of motion.  Neurological: He is alert and oriented to person, place, and time.  Skin: Skin is warm and dry.  Psychiatric: He has a normal mood and affect. His behavior is normal. Judgment and thought content normal.        Assessment   A 3 column grade 3 prolapsed internal and extra hemorrhoids.   Plan   The patient is not a candidate for office place procedures nor medical management. Recommend hemorrhoidectomy since he is quite symptomatic hemorrhoids are quite large. He does have a large external component as well. Risk of bleeding, infection, anal stenosis, additional surgery, damage to neighboring  structures, sensation deficit, postoperative pain to be severe, and other procedures discussed. He wishes to proceed.       Shalana Jardin A. 03/15/2012

## 2012-03-16 ENCOUNTER — Encounter (HOSPITAL_COMMUNITY): Payer: Self-pay | Admitting: Surgery

## 2012-03-19 ENCOUNTER — Telehealth (INDEPENDENT_AMBULATORY_CARE_PROVIDER_SITE_OTHER): Payer: Self-pay | Admitting: General Surgery

## 2012-03-19 NOTE — Telephone Encounter (Signed)
Called to check on patient and set up follow up appt after surgery but there was no answer.Marland KitchenMarland KitchenLMOM

## 2012-03-20 ENCOUNTER — Telehealth (INDEPENDENT_AMBULATORY_CARE_PROVIDER_SITE_OTHER): Payer: Self-pay

## 2012-03-20 NOTE — Telephone Encounter (Signed)
Called patient to give follow up appointment for 10/29 @ 2:40 and to tell him path results were benign. No answer, left message to call back.

## 2012-03-29 ENCOUNTER — Other Ambulatory Visit (INDEPENDENT_AMBULATORY_CARE_PROVIDER_SITE_OTHER): Payer: Self-pay | Admitting: Surgery

## 2012-04-06 ENCOUNTER — Telehealth (INDEPENDENT_AMBULATORY_CARE_PROVIDER_SITE_OTHER): Payer: Self-pay

## 2012-04-06 NOTE — Telephone Encounter (Signed)
Paged Dr Luisa Hart, okayed refill. Called into [facility] outpatient pharmacy. Left message on patients machine.

## 2012-04-06 NOTE — Telephone Encounter (Signed)
Patient called in requesting refill of vicoprofen 7.5-200. Hem rroidectomy was on 10/3. Please advise.Marland KitchenMarland Kitchen

## 2012-04-10 ENCOUNTER — Ambulatory Visit (INDEPENDENT_AMBULATORY_CARE_PROVIDER_SITE_OTHER): Payer: BC Managed Care – PPO | Admitting: Surgery

## 2012-04-10 ENCOUNTER — Encounter (INDEPENDENT_AMBULATORY_CARE_PROVIDER_SITE_OTHER): Payer: Self-pay | Admitting: Surgery

## 2012-04-10 VITALS — BP 114/68 | HR 64 | Temp 97.8°F | Resp 16 | Ht 72.0 in | Wt 177.8 lb

## 2012-04-10 DIAGNOSIS — Z9889 Other specified postprocedural states: Secondary | ICD-10-CM

## 2012-04-10 NOTE — Patient Instructions (Signed)
Return as needed

## 2012-04-10 NOTE — Progress Notes (Signed)
Patient returns after 3 column hemorrhoidectomy on 03/15/2012. He is doing well.  Exam: Well healed anal canal. No signs of infection or bleeding.  Impression: Status post hemorrhoidectomy doing well   Plan: Resume full activity. High-fiber diet. Return as needed

## 2014-05-13 ENCOUNTER — Other Ambulatory Visit: Payer: Self-pay | Admitting: Urology

## 2014-05-19 ENCOUNTER — Encounter (HOSPITAL_BASED_OUTPATIENT_CLINIC_OR_DEPARTMENT_OTHER): Payer: Self-pay | Admitting: *Deleted

## 2014-05-20 ENCOUNTER — Encounter (HOSPITAL_BASED_OUTPATIENT_CLINIC_OR_DEPARTMENT_OTHER): Payer: Self-pay | Admitting: *Deleted

## 2014-05-20 NOTE — Progress Notes (Signed)
NPO AFTER MN.  ARRIVE AT 1015.  NEEDS HG. 

## 2014-05-23 ENCOUNTER — Ambulatory Visit (HOSPITAL_BASED_OUTPATIENT_CLINIC_OR_DEPARTMENT_OTHER): Payer: BC Managed Care – PPO | Admitting: Certified Registered"

## 2014-05-23 ENCOUNTER — Encounter (HOSPITAL_BASED_OUTPATIENT_CLINIC_OR_DEPARTMENT_OTHER)
Admission: RE | Disposition: A | Discharge status: Home / Self Care | Payer: Self-pay | Source: Ambulatory Visit | Attending: Urology

## 2014-05-23 ENCOUNTER — Encounter (HOSPITAL_BASED_OUTPATIENT_CLINIC_OR_DEPARTMENT_OTHER): Payer: Self-pay | Admitting: *Deleted

## 2014-05-23 ENCOUNTER — Ambulatory Visit (HOSPITAL_BASED_OUTPATIENT_CLINIC_OR_DEPARTMENT_OTHER)
Admission: RE | Admit: 2014-05-23 | Discharge: 2014-05-23 | Disposition: A | Discharge status: Home / Self Care | Payer: BC Managed Care – PPO | Source: Ambulatory Visit | Attending: Urology | Admitting: Urology

## 2014-05-23 DIAGNOSIS — F17200 Nicotine dependence, unspecified, uncomplicated: Secondary | ICD-10-CM | POA: Diagnosis not present

## 2014-05-23 DIAGNOSIS — D494 Neoplasm of unspecified behavior of bladder: Secondary | ICD-10-CM | POA: Diagnosis present

## 2014-05-23 HISTORY — PX: TRANSURETHRAL RESECTION OF BLADDER TUMOR WITH GYRUS (TURBT-GYRUS): SHX6458

## 2014-05-23 LAB — POCT HEMOGLOBIN-HEMACUE: Hemoglobin: 16.5 g/dL (ref 13.0–17.0)

## 2014-05-23 SURGERY — TRANSURETHRAL RESECTION OF BLADDER TUMOR WITH GYRUS (TURBT-GYRUS)
Anesthesia: General | Site: Bladder

## 2014-05-23 MED ORDER — ROCURONIUM BROMIDE 100 MG/10ML IV SOLN
INTRAVENOUS | Status: DC | PRN
  Administered 2014-05-23: 30 mg via INTRAVENOUS

## 2014-05-23 MED ORDER — LACTATED RINGERS IV SOLN
INTRAVENOUS | Status: DC
Start: 2014-05-23 — End: 2014-05-23
  Filled 2014-05-23: qty 1000

## 2014-05-23 MED ORDER — OXYBUTYNIN CHLORIDE 5 MG PO TABS
5.0000 mg | ORAL_TABLET | Freq: Once | ORAL | Status: AC
  Administered 2014-05-23: 5 mg via ORAL
  Filled 2014-05-23: qty 1

## 2014-05-23 MED ORDER — FENTANYL CITRATE 0.05 MG/ML IJ SOLN
INTRAMUSCULAR | Status: DC | PRN
  Administered 2014-05-23: 25 ug via INTRAVENOUS
  Administered 2014-05-23: 50 ug via INTRAVENOUS
  Administered 2014-05-23 (×5): 25 ug via INTRAVENOUS

## 2014-05-23 MED ORDER — OXYCODONE-ACETAMINOPHEN 10-325 MG PO TABS: 1.0000 | ORAL_TABLET | ORAL | Status: DC | PRN

## 2014-05-23 MED ORDER — CIPROFLOXACIN IN D5W 400 MG/200ML IV SOLN
400.0000 mg | INTRAVENOUS | Status: AC
  Administered 2014-05-23: 400 mg via INTRAVENOUS
  Filled 2014-05-23: qty 200

## 2014-05-23 MED ORDER — LIDOCAINE HCL (CARDIAC) 20 MG/ML IV SOLN
INTRAVENOUS | Status: DC | PRN
  Administered 2014-05-23: 50 mg via INTRAVENOUS

## 2014-05-23 MED ORDER — FENTANYL CITRATE 0.05 MG/ML IJ SOLN
25.0000 ug | INTRAMUSCULAR | Status: DC | PRN
  Filled 2014-05-23: qty 1

## 2014-05-23 MED ORDER — PHENAZOPYRIDINE HCL 200 MG PO TABS: 200.0000 mg | ORAL_TABLET | Freq: Three times a day (TID) | ORAL | Status: DC | PRN

## 2014-05-23 MED ORDER — FENTANYL CITRATE 0.05 MG/ML IJ SOLN
INTRAMUSCULAR | Status: AC
  Filled 2014-05-23: qty 2

## 2014-05-23 MED ORDER — GLYCOPYRROLATE 0.2 MG/ML IJ SOLN
INTRAMUSCULAR | Status: DC | PRN
  Administered 2014-05-23: 0.6 mg via INTRAVENOUS

## 2014-05-23 MED ORDER — PROPOFOL 10 MG/ML IV BOLUS
INTRAVENOUS | Status: DC | PRN
  Administered 2014-05-23: 200 mg via INTRAVENOUS

## 2014-05-23 MED ORDER — SODIUM CHLORIDE 0.9 % IR SOLN
Status: DC | PRN
  Administered 2014-05-23: 18000 mL

## 2014-05-23 MED ORDER — ONDANSETRON HCL 4 MG/2ML IJ SOLN
INTRAMUSCULAR | Status: DC | PRN
  Administered 2014-05-23: 4 mg via INTRAVENOUS

## 2014-05-23 MED ORDER — NEOSTIGMINE METHYLSULFATE 10 MG/10ML IV SOLN
INTRAVENOUS | Status: DC | PRN
  Administered 2014-05-23: 4 mg via INTRAVENOUS

## 2014-05-23 MED ORDER — ACETAMINOPHEN 10 MG/ML IV SOLN
INTRAVENOUS | Status: DC | PRN
  Administered 2014-05-23: 1000 mg via INTRAVENOUS

## 2014-05-23 MED ORDER — MITOMYCIN CHEMO FOR BLADDER INSTILLATION 40 MG
40.0000 mg | Freq: Once | INTRAVENOUS | Status: DC
  Filled 2014-05-23: qty 40

## 2014-05-23 MED ORDER — PHENAZOPYRIDINE HCL 100 MG PO TABS
ORAL_TABLET | ORAL | Status: AC
  Filled 2014-05-23: qty 2

## 2014-05-23 MED ORDER — PHENAZOPYRIDINE HCL 200 MG PO TABS
200.0000 mg | ORAL_TABLET | Freq: Once | ORAL | Status: AC
  Administered 2014-05-23: 200 mg via ORAL
  Filled 2014-05-23: qty 1

## 2014-05-23 MED ORDER — MIDAZOLAM HCL 5 MG/5ML IJ SOLN
INTRAMUSCULAR | Status: DC | PRN
  Administered 2014-05-23: 2 mg via INTRAVENOUS

## 2014-05-23 MED ORDER — OXYBUTYNIN CHLORIDE 5 MG PO TABS
ORAL_TABLET | ORAL | Status: AC
  Filled 2014-05-23: qty 1

## 2014-05-23 MED ORDER — MIDAZOLAM HCL 2 MG/2ML IJ SOLN
INTRAMUSCULAR | Status: AC
  Filled 2014-05-23: qty 2

## 2014-05-23 MED ORDER — CIPROFLOXACIN HCL 250 MG PO TABS: 250.0000 mg | ORAL_TABLET | Freq: Two times a day (BID) | ORAL | Status: DC

## 2014-05-23 MED ORDER — LACTATED RINGERS IV SOLN
INTRAVENOUS | Status: DC
  Administered 2014-05-23: 10:00:00 via INTRAVENOUS
  Filled 2014-05-23: qty 1000

## 2014-05-23 MED ORDER — CIPROFLOXACIN IN D5W 400 MG/200ML IV SOLN
INTRAVENOUS | Status: AC
  Filled 2014-05-23: qty 200

## 2014-05-23 MED ORDER — DEXAMETHASONE SODIUM PHOSPHATE 4 MG/ML IJ SOLN
INTRAMUSCULAR | Status: DC | PRN
  Administered 2014-05-23: 8 mg via INTRAVENOUS

## 2014-05-23 MED ORDER — TOLTERODINE TARTRATE ER 4 MG PO CP24: 4.0000 mg | ORAL_CAPSULE | Freq: Every day | ORAL | Status: DC

## 2014-05-23 SURGICAL SUPPLY — 35 items
BAG DRAIN URO-CYSTO SKYTR STRL (DRAIN) ×2 IMPLANT
BAG URINE DRAINAGE (UROLOGICAL SUPPLIES) IMPLANT
BAG URINE LEG 19OZ MD ST LTX (BAG) ×2 IMPLANT
CANISTER SUCT LVC 12 LTR MEDI- (MISCELLANEOUS) ×4 IMPLANT
CATH FOLEY 2WAY SLVR  5CC 12FR (CATHETERS) ×1
CATH FOLEY 2WAY SLVR 5CC 12FR (CATHETERS) ×1 IMPLANT
CATH FOLEY 3WAY 30CC 24FR (CATHETERS)
CATH HEMA 3WAY 30CC 24FR COUDE (CATHETERS) IMPLANT
CATH HEMA 3WAY 30CC 24FR RND (CATHETERS) IMPLANT
CATH URTH STD 24FR FL 3W 2 (CATHETERS) IMPLANT
CLOTH BEACON ORANGE TIMEOUT ST (SAFETY) ×2 IMPLANT
DRAPE CAMERA CLOSED 9X96 (DRAPES) ×2 IMPLANT
ELECT BUTTON BIOP 24F 90D PLAS (MISCELLANEOUS) IMPLANT
ELECT BUTTON HF 24-28F 2 30DE (ELECTRODE) IMPLANT
ELECT LOOP MED HF 24F 12D CBL (CLIP) ×2 IMPLANT
ELECT REM PT RETURN 9FT ADLT (ELECTROSURGICAL)
ELECT RESECT VAPORIZE 12D CBL (ELECTRODE) IMPLANT
ELECTRODE REM PT RTRN 9FT ADLT (ELECTROSURGICAL) IMPLANT
EVACUATOR MICROVAS BLADDER (UROLOGICAL SUPPLIES) ×2 IMPLANT
GLOVE BIO SURGEON STRL SZ8 (GLOVE) ×2 IMPLANT
GLOVE BIOGEL M 6.5 STRL (GLOVE) ×2 IMPLANT
GLOVE BIOGEL PI IND STRL 6.5 (GLOVE) ×2 IMPLANT
GLOVE BIOGEL PI INDICATOR 6.5 (GLOVE) ×2
GOWN PREVENTION PLUS LG XLONG (DISPOSABLE) IMPLANT
GOWN STRL REIN XL XLG (GOWN DISPOSABLE) IMPLANT
GOWN STRL REUS W/TWL LRG LVL3 (GOWN DISPOSABLE) ×2 IMPLANT
GOWN STRL REUS W/TWL XL LVL3 (GOWN DISPOSABLE) ×2 IMPLANT
HOLDER FOLEY CATH W/STRAP (MISCELLANEOUS) IMPLANT
IV NS IRRIG 3000ML ARTHROMATIC (IV SOLUTION) ×12 IMPLANT
PACK CYSTO (CUSTOM PROCEDURE TRAY) ×2 IMPLANT
PLUG CATH AND CAP STER (CATHETERS) IMPLANT
SET ASPIRATION TUBING (TUBING) ×2 IMPLANT
SYR 30ML LL (SYRINGE) IMPLANT
SYRINGE IRR TOOMEY STRL 70CC (SYRINGE) IMPLANT
WATER STERILE IRR 3000ML UROMA (IV SOLUTION) IMPLANT

## 2014-05-23 NOTE — Transfer of Care (Signed)
Immediate Anesthesia Transfer of Care Note  Patient: Brian Benton  Procedure(s) Performed: Procedure(s) (LRB): TRANSURETHRAL RESECTION OF BLADDER TUMOR WITH GYRUS (TURBT-GYRUS) (N/A)  Patient Location: PACU  Anesthesia Type: General  Level of Consciousness: awake, oriented, sedated and patient cooperative  Airway & Oxygen Therapy: Patient Spontanous Breathing and Patient connected to face mask oxygen  Post-op Assessment: Report given to PACU RN and Post -op Vital signs reviewed and stable  Post vital signs: Reviewed and stable  Complications: No apparent anesthesia complications

## 2014-05-23 NOTE — Anesthesia Procedure Notes (Signed)
Procedure Name: LMA Insertion Date/Time: 05/23/2014 11:08 AM Performed by: Denna Haggard D Pre-anesthesia Checklist: Patient identified, Emergency Drugs available, Suction available and Patient being monitored Patient Re-evaluated:Patient Re-evaluated prior to inductionOxygen Delivery Method: Circle System Utilized Preoxygenation: Pre-oxygenation with 100% oxygen Intubation Type: IV induction Ventilation: Mask ventilation without difficulty LMA: LMA inserted LMA Size: 4.0 Number of attempts: 1 Airway Equipment and Method: bite block Placement Confirmation: positive ETCO2 Tube secured with: Tape Dental Injury: Teeth and Oropharynx as per pre-operative assessment

## 2014-05-23 NOTE — Op Note (Signed)
PATIENT: Brian Benton   PRE-OPERATIVE DIAGNOSIS: Bladder tumor   POST-OPERATIVE DIAGNOSIS: Same   PROCEDURE: Procedure(s):  TRANSURETHRAL RESECTION OF BLADDER TUMOR (TURBT) (5 cm.)   SURGEON: Surgeon(s):  [name]   ANESTHESIA: General   EBL: Minimal   DRAINS: Urinary Catheter (20 Fr. Foley)   SPECIMEN: Source of Specimen: Bladder tumor   DISPOSITION OF SPECIMEN: PATHOLOGY   Indication: Mr. 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 We therefore discussed proceeding with transurethral resection of the tumor. I have discussed the potential risks and complications and he has elected to proceed.   Description of operation: The patient was taken to the operating room and administered general anesthesia. He was then placed on the table and moved to the dorsal lithotomy position after which his genitalia was sterilely prepped and draped. An official timeout was then performed.   The 58 French resectoscope with the 12 lens and visual obturator were then passed down the urethra under direct visualization. Urethra appeared normal. The visual obturator was then removed and the Gyrus resectoscope element with 12 lens was then inserted and the bladder was fully and systematically inspected. The right reteral orifice was noted to be in its normal anatomic position. The left ureteral orifice could not be identified due to the presence of tumor involving the trigonal area on the left-hand side. The tumor was noted to be close to the bladder neck but did not cross the bladder neck into the area of the prostatic urethra. It extended from about the midline on out to about  the 5:00 position. This was the papillary component however there was papillary fronds that were noted to be flush with the bladder mucosa and these extended for an area of at least 5 cm to the midline back onto the posterior wall and extending over onto the left wall. The bladder was noted to be moderately trabeculated as well.  I first began by resecting the bladder tumor from the area of the trigone. I was unable to visualize the left ureteral orifice at any time but used judicious point cautery in order to control any bleeding and not result in any scarring that could potentially result in left ureteral obstruction. I then resected away all of the abnormal appearing mucosa which was quite extensive. It did involve the left wall of the bladder and paralyzation was performed by anesthesia however there still was a single episode of obturator reflex which resulted in marked thinning of the bladder wall. I could see fat. This was a small area and I finished resecting all tumor and then used the Microvasive evacuatorr to remove all of the portions of bladder tumor which were sent to pathology. I then removed the resectoscope.   A 20 French Foley catheter was then inserted in the bladder and irrigated. The irrigant returned slightly pink with no clots. The patient was awakened and taken to the recovery room in stable and satisfactory condition. He tolerated the procedure well no intraoperative complications.

## 2014-05-23 NOTE — Anesthesia Postprocedure Evaluation (Signed)
  Anesthesia Post-op Note  Patient: Brian Benton  Procedure(s) Performed: Procedure(s) (LRB): TRANSURETHRAL RESECTION OF BLADDER TUMOR WITH GYRUS (TURBT-GYRUS) (N/A)  Patient Location: PACU  Anesthesia Type: General  Level of Consciousness: awake and alert   Airway and Oxygen Therapy: Patient Spontanous Breathing  Post-op Pain: mild  Post-op Assessment: Post-op Vital signs reviewed, Patient's Cardiovascular Status Stable, Respiratory Function Stable, Patent Airway and No signs of Nausea or vomiting  Last Vitals:  Filed Vitals:   05/23/14 1404  BP: 147/79  Pulse: 54  Temp: 36.4 C  Resp: 12    Post-op Vital Signs: stable   Complications: No apparent anesthesia complications

## 2014-05-23 NOTE — Discharge Instructions (Signed)
Transurethral Resection of Bladder Tumor (TURBT)   Definition:  Transurethral Resection of the Bladder Tumor is a surgical procedure used to diagnose and remove tumors within the bladder. TURBT is the most common treatment for early stage bladder cancer.  General instructions:     Your recent bladder surgery requires very little post hospital care but some definite precautions.  Despite the fact that no skin incisions were used, the area around the bladder incisions are raw and covered with scabs to promote healing and prevent bleeding. Certain precautions are needed to insure that the scabs are not disturbed over the next 2-4 weeks while the healing proceeds.  Because the raw surface inside your bladder and the irritating effects of urine you may expect frequency of urination and/or urgency (a stronger desire to urinate) and perhaps even getting up at night more often. This will usually resolve or improve slowly over the healing period. You may see some blood in your urine over the first 6 weeks. Do not be alarmed, even if the urine was clear for a while. Get off your feet and drink lots of fluids until clearing occurs. If you start to pass clots or don't improve call us.  Catheter: (If you are discharged with a catheter.)  1. Keep your catheter secured to your leg at all times with tape or the supplied strap. 2. You may experience leakage of urine around your catheter- as long as the  catheter continues to drain, this is normal.  If your catheter stops draining  go to the ER. 3. You may also have blood in your urine, even after it has been clear for  several days; you may even pass some small blood clots or other material.  This  is normal as well.  If this happens, sit down and drink plenty of water to help  make urine to flush out your bladder.  If the blood in your urine becomes worse  after doing this, contact our office or return to the ER. 4. You may use the leg bag (small bag)  during the day, but use the large bag at  night.  Diet:  You may return to your normal diet immediately. Because of the raw surface of your bladder, alcohol, spicy foods, foods high in acid and drinks with caffeine may cause irritation or frequency and should be used in moderation. To keep your urine flowing freely and avoid constipation, drink plenty of fluids during the day (8-10 glasses). Tip: Avoid cranberry juice because it is very acidic.  Activity:  Your physical activity doesn't need to be restricted. However, if you are very active, you may see some blood in the urine. We suggest that you reduce your activity under the circumstances until the bleeding has stopped.  Bowels:  It is important to keep your bowels regular during the postoperative period. Straining with bowel movements can cause bleeding. A bowel movement every other day is reasonable. Use a mild laxative if needed, such as milk of magnesia 2-3 tablespoons, or 2 Dulcolax tablets. Call if you continue to have problems. If you had been taking narcotics for pain, before, during or after your surgery, you may be constipated. Take a laxative if necessary.    Medication:  You should resume your pre-surgery medications unless told not to. In addition you may be given an antibiotic to prevent or treat infection. Antibiotics are not always necessary. All medication should be taken as prescribed until the bottles are finished unless you are having  an unusual reaction to one of the drugs.     Post Anesthesia Home Care Instructions  Activity: Get plenty of rest for the remainder of the day. A responsible adult should stay with you for 24 hours following the procedure.  For the next 24 hours, DO NOT: -Drive a car -Paediatric nurse -Drink alcoholic beverages -Take any medication unless instructed by your physician -Make any legal decisions or sign important papers.  Meals: Start with liquid foods such as gelatin or soup.  Progress to regular foods as tolerated. Avoid greasy, spicy, heavy foods. If nausea and/or vomiting occur, drink only clear liquids until the nausea and/or vomiting subsides. Call your physician if vomiting continues.  Special Instructions/Symptoms: Your throat may feel dry or sore from the anesthesia or the breathing tube placed in your throat during surgery. If this causes discomfort, gargle with warm salt water. The discomfort should disappear within 24 hours.  Indwelling Urinary Catheter Care You have been given a flexible tube (catheter) used to drain the bladder. Catheters are often used when a person has difficulty urinating due to blockage, bleeding, infection, or inability to control bladder or bowel movements (incontinence). A catheter requires daily care to prevent infection and blockage. HOME CARE INSTRUCTIONS  Do the following to reduce the risk of infection. Antibiotic medicines cannot prevent infections. Limit the number of bacteria entering your bladder  Wash your hands for 2 minutes with soapy water before and after handling the catheter.  Wash your bottom and the entire catheter twice daily, as well as after each bowel movement. Wash the tip of the penis or just above the vaginal opening with soap and warm water, rinse, and then wash the rectal area. Always wash from front to back.  When changing from the leg bag to overnight bag or from the overnight bag to leg bag, thoroughly clean the end of the catheter where it connects to the tubing with an alcohol wipe.  Clean the leg bag and overnight bag daily after use. Replace your drainage bags weekly.  Always keep the tubing and bag below the level of your bladder. This allows your urine to drain properly. Lifting the bag or tubing above the level of your bladder will cause dirty urine to flow back into your bladder. If you must briefly lift the bag higher than your bladder, pinch the catheter or tubing to prevent backflow.  Drink  enough water and fluids to keep your urine clear or pale yellow, or as directed by your caregiver. This will flush bacteria out of the bladder. Protect tissues from injury  Attach the catheter to your leg so there is no tension on the catheter. Use adhesive tape or a leg strap. If you are using adhesive tape, remove any sticky residue left behind by the previous tape you used.  Place your leg bag on your lower leg. Fasten the straps securely and comfortably.  Do not remove the catheter yourself unless you have been instructed how to do so. Keep the urinary pathway open  Check throughout the day to be sure your catheter is working and urine is draining freely. Make sure the tubing does not become kinked.  Do not let the drainage bag overfill. SEEK IMMEDIATE MEDICAL CARE IF:   The catheter becomes blocked. Urine is not draining.  Urine is leaking.  You have any pain.  You have a fever. Document Released: 05/30/2005 Document Revised: 05/16/2012 Document Reviewed: 10/29/2009 [facility] Patient Information 2015 [place], [place]. This information is not intended  to replace advice given to you by your health care provider. Make sure you discuss any questions you have with your health care provider. ° °

## 2014-05-23 NOTE — Progress Notes (Signed)
Order clarification regarding mitomycin obtained.  Disregard order per A. Ulyess Mort, RN

## 2014-05-23 NOTE — H&P (Signed)
History of Present Illness Gross hematuria: He experienced gross hematuria for approximately 2 weeks and was evaluated and found to have microscopic hematuria. A KUB was obtained and read as possibly a stone in the right kidney. He has a 13-pack-year smoking history.     He reports that he was involved in martial arts and ruptured his right testicle. His also had a vasectomy.     Interval Hx: He has not seen any further gross hematuria.   Surgical History Problems  1. History of Destruction Of External Hemorrhoids  Current Meds 1. No Reported Medications Recorded  Allergies Medication  1. No Known Drug Allergies  Family History Problems  1. Family history of leukemia (Z80.6) : Mother  Social History Problems  1. Alcohol use (F10.99)   3 to 4 servings a week - drinks socially 2. Caffeine use (F15.90)   3 servings a day 3. Current every day smoker (F17.200)   in the process of quitting, switching to E-cigs 4. Married 5. Occupation   Metallurgist Vital Signs  Blood Pressure: 121 / 42 Temperature: 97.5 F Heart Rate: 61  Review of Systems Genitourinary, constitutional, skin, eye, otolaryngeal, hematologic/lymphatic, cardiovascular, pulmonary, endocrine, musculoskeletal, gastrointestinal, neurological and psychiatric system(s) were reviewed and pertinent findings if present are noted.  Genitourinary: hematuria.    Physical Exam Constitutional: Well nourished and well developed . No acute distress.  ENT:. The ears and nose are normal in appearance.  Neck: The appearance of the neck is normal and no neck mass is present.  Pulmonary: No respiratory distress and normal respiratory rhythm and effort.  Cardiovascular: Heart rate and rhythm are normal . No peripheral edema.  Abdomen: The abdomen is soft and nontender. No masses are palpated. No CVA tenderness. No hernias are palpable. No hepatosplenomegaly noted.  Rectal: Rectal exam demonstrates normal sphincter  tone, no tenderness and no masses. Estimated prostate size is 1+. The prostate has no nodularity and is not tender. The left seminal vesicle is nonpalpable. The right seminal vesicle is nonpalpable. The perineum is normal on inspection.  Genitourinary: Examination of the penis demonstrates no discharge, no masses, no lesions and a normal meatus. The penis is circumcised. The scrotum is without lesions. The right epididymis is palpably normal and non-tender. The left epididymis is palpably normal and non-tender. The right testis is atrophic, but non-tender and without masses. The left testis is normal, non-tender and without masses.  Lymphatics: The femoral and inguinal nodes are not enlarged or tender.  Skin: Normal skin turgor, no visible rash and no visible skin lesions.  Neuro/Psych:. Mood and affect are appropriate.    Results/Data  The following images/tracing/specimen were independently visualized:  CT scan as below.  The following clinical lab reports were reviewed:  UA:.  The following radiology reports were reviewed: CT scan. Selected Results  BUN & CREATINI  SPECIMEN TYPE: BLOOD  Test Name Result Flag Reference CREATININE 0.90 mg/dL  0.50-1.50 BUN 12 mg/dL  6-23 Est GFR, African American >89 mL/min   Est GFR, NonAfrican American >89 mL/min   THE ESTIMATED GFR IS A CALCULATION VALID FOR ADULTS (>=6 YEARS OLD) THAT USES THE CKD-EPI ALGORITHM TO ADJUST FOR AGE AND SEX. IT IS   NOT TO BE USED FOR CHILDREN, PREGNANT WOMEN, HOSPITALIZED PATIENTS,    PATIENTS ON DIALYSIS, OR WITH RAPIDLY CHANGING KIDNEY FUNCTION. ACCORDING TO THE NKDEP, EGFR >89 IS NORMAL, 60-89 SHOWS MILD IMPAIRMENT, 30-59 SHOWS MODERATE IMPAIRMENT, 15-29 SHOWS SEVERE IMPAIRMENT AND <15 IS ESRD.  PSA  REFLEX TO FREE [id number] 09:09AM [name] SPECIMEN TYPE: BLOOD  Test Name Result Flag Reference PSA 2.58 ng/mL  <=4.00 TEST METHODOLOGY: ECLIA PSA (ELECTROCHEMILUMINESCENCE IMMUNOASSAY)   Procedure:  Cystoscopy done on 05/06/14  Indication: Hematuria.  Informed Consent: Risks, benefits, and potential adverse events were discussed and informed consent was obtained from the patient.  Prep: The patient was prepped with betadine.  Anesthesia:. Local anesthesia was administered intraurethrally with 2% lidocaine jelly.  Procedure Note:  Urethral meatus:. No abnormalities.  Anterior urethra: No abnormalities.  Prostatic urethra: No abnormalities.  Bladder: Visulization was clear. The ureteral orifices were in the normal anatomic position bilaterally and had clear efflux of urine. A systematic survey of the bladder demonstrated no bladder tumors or stones. The mucosa was smooth without abnormalities. The patient tolerated the procedure well.  Complications: None.    Assessment  I went over the results of the CT scan as well as my cystoscopic findings today which have revealed a bladder mass consistent with transitional cell carcinoma. We discussed the fact that currently there is no evidence of extravesical extension or pelvic adenopathy based on CT scan findings. Further characterization of the lesion is required for grading and staging purposes. We discussed proceeding with evaluation using transurethral resection of the lesion. I have discussed the procedure in detail as well as the potential risks and complications associated with this form of surgery. We also discussed the probability of successful resection of the intravesical portion of this lesion. I also told him that because the lesion involves his left ureteral orifice region I may need to resect through the orifice which can place him at an increased risk of stricturing of the distal ureter and obstruction. I have recommended, as long as there is no contraindication at the time of surgery, the placement of intravesical mitomycin-C in order to reduce the risk of recurrence. We did discuss the potential side effects of this form of intravesical  chemotherapy. The procedure will be performed under anesthesia as an outpatient.    I also discussed with him the finding of a possible internal hernia of the bowel and have given him a copy of his CT scan report. He has not experienced any abdominal pain whatsoever and I found no abdominal tenderness on his examination today.   Plan He is scheduled for a transurethral resection of his left trigonal bladder tumor with postoperative mitomycin-C instillation.

## 2014-05-23 NOTE — Op Note (Deleted)
PATIENT:  Brian Benton  PRE-OPERATIVE DIAGNOSIS: Bladder tumor  POST-OPERATIVE DIAGNOSIS: Same  PROCEDURE:  Procedure(s): TRANSURETHRAL RESECTION OF BLADDER TUMOR (TURBT) (5 cm.)  SURGEON:  Surgeon(s): [name]  ANESTHESIA:   General  EBL:  Minimal  DRAINS: Urinary Catheter (20 Fr. Foley)   SPECIMEN:  Source of Specimen:  Bladder tumor  DISPOSITION OF SPECIMEN:  PATHOLOGY  Indication: Brian Benton is a 58 year old male who experienced gross hematuria and underwent a CT scan to evaluate the upper tract which revealed no abnormality of the upper tract however there was an area on the left side of the bladder that appeared to be mass like and this was confirmed cystoscopically to be a papillary tumor with surrounding sessile abnormalities of the bladder mucosa. We therefore discussed proceeding with transurethral resection of the tumor. I have discussed the potential risks and complications and he has elected to proceed.  Description of operation: The patient was taken to the operating room and administered general anesthesia. He was then placed on the table and moved to the dorsal lithotomy position after which his genitalia was sterilely prepped and draped. An official timeout was then performed.  The 63 French resectoscope with the 12 lens and visual obturator were then passed down the urethra under direct visualization. Urethra appeared normal. The visual obturator was then removed and the Gyrus resectoscope element with 12 lens was then inserted and the bladder was fully and systematically inspected. The right reteral orifice was noted to be in its normal anatomic position. The left ureteral orifice could not be identified due to the presence of tumor involving the trigonal area on the left-hand side. The tumor was noted to be close to the bladder neck but did not cross the bladder neck into the area of the prostatic urethra. It extended from about the midline on out to about the  5:00 position. This was the papillary component however there was papillary fronds that were noted to be flush with the bladder mucosa and these extended for an area of at least 5 cm to the midline back onto the posterior wall and extending over onto the left wall. The bladder was noted to be moderately trabeculated as well.  I first began by resecting the bladder tumor from the area of the trigone. I was unable to visualize the left ureteral orifice at any time but used judicious point cautery in order to control any bleeding and not result in any scarring that could potentially result in left ureteral obstruction. I then resected away all of the abnormal appearing mucosa which was quite extensive. It did involve the left wall of the bladder and paralyzation was performed by anesthesia however there still was a single episode of obturator reflex which resulted in marked thinning of the bladder wall. I could see fat. This was a small area and I finished resecting all tumor and then used the Microvasive evacuatorr to remove all of the portions of bladder tumor which were sent to pathology. I then removed the resectoscope.  A 20 French Foley catheter was then inserted in the bladder and irrigated. The irrigant returned slightly pink with no clots. The patient was awakened and taken to the recovery room in stable and satisfactory condition. He tolerated the procedure well no intraoperative complications.  While in the recovery room 40 mg of mitomycin-C in 40 cc of water were instilled in the bladder through the catheter and the catheter was plugged. This will remain indwelling for approximately one hour.  It will then be drained from the bladder and the catheter will be removed and the patient discharged home.  PLAN OF CARE: Discharge to home after PACU  PATIENT DISPOSITION:  PACU - hemodynamically stable.

## 2014-05-23 NOTE — Anesthesia Preprocedure Evaluation (Addendum)
Anesthesia Evaluation  Patient identified by MRN, date of birth, ID band Patient awake    Reviewed: Allergy & Precautions, H&P , NPO status , Patient's Chart, lab work & pertinent test results  Airway Mallampati: I  TM Distance: >3 FB Neck ROM: Full    Dental  (+) Dental Advisory Given, Caps Bridge across all front upper teeth:   Pulmonary Current Smoker,  breath sounds clear to auscultation  Pulmonary exam normal       Cardiovascular Exercise Tolerance: Good - CAD, - Past MI and - CHF negative cardio ROS  Rhythm:Regular Rate:Normal     Neuro/Psych negative neurological ROS  negative psych ROS   GI/Hepatic negative GI ROS, Neg liver ROS,   Endo/Other  negative endocrine ROS  Renal/GU negative Renal ROS  negative genitourinary   Musculoskeletal negative musculoskeletal ROS (+)   Abdominal   Peds  Hematology negative hematology ROS (+)   Anesthesia Other Findings   Reproductive/Obstetrics negative OB ROS                            Anesthesia Physical Anesthesia Plan  ASA: II  Anesthesia Plan: General   Post-op Pain Management:    Induction: Intravenous  Airway Management Planned: LMA  Additional Equipment:   Intra-op Plan:   Post-operative Plan:   Informed Consent: I have reviewed the patients History and Physical, chart, labs and discussed the procedure including the risks, benefits and alternatives for the proposed anesthesia with the patient or authorized representative who has indicated his/her understanding and acceptance.   Dental Advisory Given  Plan Discussed with: CRNA and Surgeon  Anesthesia Plan Comments:         Anesthesia Quick Evaluation

## 2014-05-26 ENCOUNTER — Encounter (HOSPITAL_BASED_OUTPATIENT_CLINIC_OR_DEPARTMENT_OTHER): Payer: Self-pay | Admitting: Urology

## 2014-06-02 ENCOUNTER — Other Ambulatory Visit: Payer: Self-pay | Admitting: Urology

## 2014-07-22 ENCOUNTER — Encounter (HOSPITAL_BASED_OUTPATIENT_CLINIC_OR_DEPARTMENT_OTHER): Payer: Self-pay | Admitting: *Deleted

## 2014-07-23 ENCOUNTER — Encounter (HOSPITAL_BASED_OUTPATIENT_CLINIC_OR_DEPARTMENT_OTHER): Payer: Self-pay | Admitting: *Deleted

## 2014-07-23 NOTE — Progress Notes (Signed)
NPO AFTER MN.  ARRIVE AT 0600.  NEEDS HG.  

## 2014-07-25 ENCOUNTER — Encounter (HOSPITAL_BASED_OUTPATIENT_CLINIC_OR_DEPARTMENT_OTHER): Payer: Self-pay

## 2014-07-25 ENCOUNTER — Ambulatory Visit (HOSPITAL_BASED_OUTPATIENT_CLINIC_OR_DEPARTMENT_OTHER): Payer: 59 | Admitting: Anesthesiology

## 2014-07-25 ENCOUNTER — Encounter (HOSPITAL_BASED_OUTPATIENT_CLINIC_OR_DEPARTMENT_OTHER)
Admission: RE | Disposition: A | Discharge status: Home / Self Care | Payer: Self-pay | Source: Ambulatory Visit | Attending: Urology

## 2014-07-25 ENCOUNTER — Ambulatory Visit (HOSPITAL_BASED_OUTPATIENT_CLINIC_OR_DEPARTMENT_OTHER)
Admission: RE | Admit: 2014-07-25 | Discharge: 2014-07-25 | Disposition: A | Discharge status: Home / Self Care | Payer: 59 | Source: Ambulatory Visit | Attending: Urology | Admitting: Urology

## 2014-07-25 DIAGNOSIS — C674 Malignant neoplasm of posterior wall of bladder: Secondary | ICD-10-CM | POA: Insufficient documentation

## 2014-07-25 DIAGNOSIS — F17210 Nicotine dependence, cigarettes, uncomplicated: Secondary | ICD-10-CM | POA: Insufficient documentation

## 2014-07-25 DIAGNOSIS — Z8551 Personal history of malignant neoplasm of bladder: Secondary | ICD-10-CM

## 2014-07-25 DIAGNOSIS — C679 Malignant neoplasm of bladder, unspecified: Secondary | ICD-10-CM | POA: Diagnosis present

## 2014-07-25 HISTORY — PX: CYSTOSCOPY WITH BIOPSY: SHX5122

## 2014-07-25 LAB — POCT HEMOGLOBIN-HEMACUE: Hemoglobin: 14.9 g/dL (ref 13.0–17.0)

## 2014-07-25 SURGERY — CYSTOSCOPY, WITH BIOPSY
Anesthesia: General | Site: Bladder

## 2014-07-25 MED ORDER — MIDAZOLAM HCL 5 MG/5ML IJ SOLN
INTRAMUSCULAR | Status: DC | PRN
  Administered 2014-07-25: 2 mg via INTRAVENOUS

## 2014-07-25 MED ORDER — PHENAZOPYRIDINE HCL 200 MG PO TABS
200.0000 mg | ORAL_TABLET | Freq: Once | ORAL | Status: AC
  Administered 2014-07-25: 200 mg via ORAL
  Filled 2014-07-25: qty 1

## 2014-07-25 MED ORDER — KETOROLAC TROMETHAMINE 30 MG/ML IJ SOLN
INTRAMUSCULAR | Status: DC | PRN
  Administered 2014-07-25: 30 mg via INTRAVENOUS

## 2014-07-25 MED ORDER — FENTANYL CITRATE 0.05 MG/ML IJ SOLN
INTRAMUSCULAR | Status: DC | PRN
  Administered 2014-07-25: 50 ug via INTRAVENOUS

## 2014-07-25 MED ORDER — ONDANSETRON HCL 4 MG/2ML IJ SOLN
INTRAMUSCULAR | Status: DC | PRN
  Administered 2014-07-25: 4 mg via INTRAVENOUS

## 2014-07-25 MED ORDER — PROMETHAZINE HCL 25 MG/ML IJ SOLN
6.2500 mg | INTRAMUSCULAR | Status: DC | PRN
  Filled 2014-07-25: qty 1

## 2014-07-25 MED ORDER — SODIUM CHLORIDE 0.9 % IR SOLN
Status: DC | PRN
  Administered 2014-07-25: 500 mL
  Administered 2014-07-25: 3000 mL

## 2014-07-25 MED ORDER — PROPOFOL INFUSION 10 MG/ML OPTIME
INTRAVENOUS | Status: DC | PRN
  Administered 2014-07-25: 170 mL via INTRAVENOUS

## 2014-07-25 MED ORDER — DEXAMETHASONE SODIUM PHOSPHATE 10 MG/ML IJ SOLN
INTRAMUSCULAR | Status: DC | PRN
  Administered 2014-07-25: 10 mg via INTRAVENOUS

## 2014-07-25 MED ORDER — STERILE WATER FOR IRRIGATION IR SOLN
Status: DC | PRN
  Administered 2014-07-25: 6000 mL

## 2014-07-25 MED ORDER — LIDOCAINE HCL (CARDIAC) 20 MG/ML IV SOLN
INTRAVENOUS | Status: DC | PRN
  Administered 2014-07-25: 80 mg via INTRAVENOUS

## 2014-07-25 MED ORDER — LACTATED RINGERS IV SOLN
INTRAVENOUS | Status: DC
  Administered 2014-07-25: 06:00:00 via INTRAVENOUS
  Filled 2014-07-25: qty 1000

## 2014-07-25 MED ORDER — PHENAZOPYRIDINE HCL 200 MG PO TABS: 200.0000 mg | ORAL_TABLET | Freq: Three times a day (TID) | ORAL | Status: DC | PRN

## 2014-07-25 MED ORDER — FENTANYL CITRATE 0.05 MG/ML IJ SOLN
25.0000 ug | INTRAMUSCULAR | Status: DC | PRN
  Filled 2014-07-25: qty 1

## 2014-07-25 MED ORDER — LACTATED RINGERS IV SOLN
INTRAVENOUS | Status: DC
  Filled 2014-07-25: qty 1000

## 2014-07-25 MED ORDER — MEPERIDINE HCL 25 MG/ML IJ SOLN
6.2500 mg | INTRAMUSCULAR | Status: DC | PRN
  Filled 2014-07-25: qty 1

## 2014-07-25 MED ORDER — CIPROFLOXACIN IN D5W 200 MG/100ML IV SOLN
200.0000 mg | INTRAVENOUS | Status: AC
  Administered 2014-07-25 (×2): 200 mg via INTRAVENOUS
  Filled 2014-07-25: qty 100

## 2014-07-25 MED ORDER — CIPROFLOXACIN IN D5W 200 MG/100ML IV SOLN
INTRAVENOUS | Status: AC
  Filled 2014-07-25: qty 100

## 2014-07-25 MED ORDER — HYDROCODONE-ACETAMINOPHEN 10-325 MG PO TABS: 1.0000 | ORAL_TABLET | ORAL | Status: DC | PRN

## 2014-07-25 MED ORDER — FENTANYL CITRATE 0.05 MG/ML IJ SOLN
INTRAMUSCULAR | Status: AC
  Filled 2014-07-25: qty 4

## 2014-07-25 MED ORDER — PHENAZOPYRIDINE HCL 100 MG PO TABS
ORAL_TABLET | ORAL | Status: AC
  Filled 2014-07-25: qty 2

## 2014-07-25 MED ORDER — MIDAZOLAM HCL 2 MG/2ML IJ SOLN
INTRAMUSCULAR | Status: AC
  Filled 2014-07-25: qty 2

## 2014-07-25 SURGICAL SUPPLY — 32 items
BAG DRAIN URO-CYSTO SKYTR STRL (DRAIN) ×3 IMPLANT
BAG URINE DRAINAGE (UROLOGICAL SUPPLIES) IMPLANT
BAG URINE LEG 19OZ MD ST LTX (BAG) IMPLANT
CANISTER SUCT LVC 12 LTR MEDI- (MISCELLANEOUS) ×3 IMPLANT
CATH FOLEY 3WAY 30CC 24FR (CATHETERS)
CATH HEMA 3WAY 30CC 24FR COUDE (CATHETERS) IMPLANT
CATH HEMA 3WAY 30CC 24FR RND (CATHETERS) IMPLANT
CATH URTH STD 24FR FL 3W 2 (CATHETERS) IMPLANT
CLOTH BEACON ORANGE TIMEOUT ST (SAFETY) ×3 IMPLANT
DRAPE CAMERA CLOSED 9X96 (DRAPES) ×3 IMPLANT
ELECT BUTTON BIOP 24F 90D PLAS (MISCELLANEOUS) IMPLANT
ELECT BUTTON HF 24-28F 2 30DE (ELECTRODE) IMPLANT
ELECT LOOP MED HF 24F 12D CBL (CLIP) IMPLANT
ELECT REM PT RETURN 9FT ADLT (ELECTROSURGICAL) ×3
ELECTRODE REM PT RTRN 9FT ADLT (ELECTROSURGICAL) ×2 IMPLANT
EVACUATOR MICROVAS BLADDER (UROLOGICAL SUPPLIES) ×3 IMPLANT
GLOVE BIO SURGEON STRL SZ8 (GLOVE) ×3 IMPLANT
GLOVE BIOGEL PI IND STRL 6.5 (GLOVE) ×2 IMPLANT
GLOVE BIOGEL PI INDICATOR 6.5 (GLOVE) ×1
GLOVE SURG SS PI 6.5 STRL IVOR (GLOVE) ×3 IMPLANT
GLOVE SURG SS PI 7.5 STRL IVOR (GLOVE) ×6 IMPLANT
GOWN STRL REUS W/ TWL XL LVL3 (GOWN DISPOSABLE) ×6 IMPLANT
GOWN STRL REUS W/TWL XL LVL3 (GOWN DISPOSABLE) ×3
HOLDER FOLEY CATH W/STRAP (MISCELLANEOUS) IMPLANT
IV NS IRRIG 3000ML ARTHROMATIC (IV SOLUTION) ×3 IMPLANT
NS IRRIG 500ML POUR BTL (IV SOLUTION) ×3 IMPLANT
PACK CYSTO (CUSTOM PROCEDURE TRAY) ×3 IMPLANT
PLUG CATH AND CAP STER (CATHETERS) IMPLANT
SET ASPIRATION TUBING (TUBING) ×3 IMPLANT
SYR 30ML LL (SYRINGE) IMPLANT
SYRINGE IRR TOOMEY STRL 70CC (SYRINGE) IMPLANT
WATER STERILE IRR 3000ML UROMA (IV SOLUTION) ×6 IMPLANT

## 2014-07-25 NOTE — Anesthesia Procedure Notes (Signed)
Procedure Name: LMA Insertion Date/Time: 07/25/2014 7:34 AM Performed by: Wanita Chamberlain Pre-anesthesia Checklist: Patient identified, Timeout performed, Emergency Drugs available, Suction available and Patient being monitored Patient Re-evaluated:Patient Re-evaluated prior to inductionOxygen Delivery Method: Circle system utilized Preoxygenation: Pre-oxygenation with 100% oxygen Intubation Type: IV induction LMA: LMA inserted LMA Size: 4.0 Number of attempts: 1 Airway Equipment and Method: Bite block Placement Confirmation: breath sounds checked- equal and bilateral Tube secured with: Tape Dental Injury: Teeth and Oropharynx as per pre-operative assessment

## 2014-07-25 NOTE — Anesthesia Postprocedure Evaluation (Signed)
  Anesthesia Post-op Note  Patient: Brian Benton  Procedure(s) Performed: Procedure(s) (LRB): CYSTOSCOPY WITH BLADDER BIOPSY AND FULGERATION (N/A)  Patient Location: PACU  Anesthesia Type: General  Level of Consciousness: awake and alert   Airway and Oxygen Therapy: Patient Spontanous Breathing  Post-op Pain: mild  Post-op Assessment: Post-op Vital signs reviewed, Patient's Cardiovascular Status Stable, Respiratory Function Stable, Patent Airway and No signs of Nausea or vomiting  Last Vitals:  Filed Vitals:   07/25/14 0955  BP: 149/80  Pulse: 57  Temp: 36.3 C  Resp: 16    Post-op Vital Signs: stable   Complications: No apparent anesthesia complications

## 2014-07-25 NOTE — H&P (Signed)
Brian Benton is a 59 year old male with a history of bladder cancer.  History of Present Illness Transitional cell carcinoma of the bladder: He experienced gross hematuria for approximately 2 weeks and was evaluated and found to have microscopic hematuria. A KUB was obtained and read as possibly a stone in the right kidney. He has a 13-pack-year smoking history.  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    He reports that he was involved in martial arts and ruptured his right testicle. His also had a vasectomy.     Interval Hx: He said he has done exceedingly well after the surgery. No pain and his urine has cleared completely.   Surgical History Problems  1. History of Destruction Of External Hemorrhoids  Current Meds 1. No Reported Medications Recorded  Allergies Medication  1. No Known Drug Allergies  Family History Problems  1. Family history of leukemia (Z80.6) : Mother  Social History Problems  1. Alcohol use (F10.99)   3 to 4 servings a week - drinks socially 2. Caffeine use (F15.90)   3 servings a day 3. Current every day smoker (F17.200)   in the process of quitting, switching to E-cigs 4. Married 5. Occupation   IT consultant Vital Signs   Blood Pressure: 121 / 62 Temperature: 97.5 F Heart Rate: 61  Review of Systems Genitourinary, constitutional, skin, eye, otolaryngeal, hematologic/lymphatic, cardiovascular, pulmonary, endocrine, musculoskeletal, gastrointestinal, neurological and psychiatric system(s) were reviewed and pertinent findings if present are noted.  Genitourinary: No recent hematuria.    Physical Exam Constitutional: Well nourished and well developed . No acute distress.  ENT:. The ears and nose are normal in appearance.  Neck: The appearance of the neck is normal and no neck mass is present.  Pulmonary: No respiratory distress and normal respiratory  rhythm and effort.  Cardiovascular: Heart rate and rhythm are normal . No peripheral edema.  Abdomen: The abdomen is soft and nontender. No masses are palpated. No CVA tenderness. No hernias are palpable. No hepatosplenomegaly noted.  Rectal: Rectal exam demonstrates normal sphincter tone, no tenderness and no masses. Estimated prostate size is 1+. The prostate has no nodularity and is not tender. The left seminal vesicle is nonpalpable. The right seminal vesicle is nonpalpable. The perineum is normal on inspection.  Genitourinary: Examination of the penis demonstrates no discharge, no masses, no lesions and a normal meatus. The penis is circumcised. The scrotum is without lesions. The right epididymis is palpably normal and non-tender. The left epididymis is palpably normal and non-tender. The right testis is atrophic, but non-tender and without masses. The left testis is normal, non-tender and without masses.  Lymphatics: The femoral and inguinal nodes are not enlarged or tender.  Skin: Normal skin turgor, no visible rash and no visible skin lesions.  Neuro/Psych:. Mood and affect are appropriate.     Assessment Assessed  1. Malignant neoplasm of other specified sites of bladder (C67.9)  I went over the results of his pathology report with him. It is revealed high-grade transitional cell carcinoma but there was no evidence of invasion. Because of this finding and due to the fact that he had such a large volume of disease I have recommended first that after allowing his bladder to  heal from his previous surgery I will need to perform repeat cystoscopic evaluation and resection of any further areas that appear suspicious for persistent TCCa. Then he will also need to be treated with intravesical BCG.  My recommendation for this treatment is based on the large volume of disease and its high-grade nature. We briefly discussed BCG therapy today.    His catheter was removed and he does understand that  very likely may experience irritative voiding symptoms for some time while his bladder is healing.     We discussed the fact that his tumor involved his left ureteral orifice. He is not having any flank pain but I did reassess his left kidney with a renal ultrasound 1 month after I saw him last and this revealed no evidence of left hydronephrosis.  Plan: Cystoscopy with bladder biopsy and resection of any visible tumor.

## 2014-07-25 NOTE — Transfer of Care (Signed)
Immediate Anesthesia Transfer of Care Note  Patient: Brian Benton  Procedure(s) Performed: Procedure(s): CYSTOSCOPY WITH BLADDER BIOPSY AND FULGERATION (N/A)  Patient Location: PACU  Anesthesia Type:General  Level of Consciousness: awake, alert , oriented and patient cooperative  Airway & Oxygen Therapy: Patient Spontanous Breathing and Patient connected to nasal cannula oxygen  Post-op Assessment: Report given to RN and Post -op Vital signs reviewed and stable  Post vital signs: Reviewed and stable  Last Vitals:  Filed Vitals:   07/25/14 0550  BP: 119/75  Pulse: 71  Temp: 01.7 C    Complications: No apparent anesthesia complications

## 2014-07-25 NOTE — Anesthesia Preprocedure Evaluation (Addendum)
Anesthesia Evaluation  Patient identified by MRN, date of birth, ID band Patient awake    Reviewed: Allergy & Precautions, H&P , NPO status , Patient's Chart, lab work & pertinent test results  Airway Mallampati: I  TM Distance: >3 FB Neck ROM: Full    Dental  (+) Dental Advisory Given, Caps,  Bridge across all front upper teeth:   Pulmonary Current Smoker, former smoker,  breath sounds clear to auscultation  Pulmonary exam normal       Cardiovascular Exercise Tolerance: Good negative cardio ROS  Rhythm:Regular Rate:Normal     Neuro/Psych negative neurological ROS  negative psych ROS   GI/Hepatic negative GI ROS, Neg liver ROS,   Endo/Other  negative endocrine ROS  Renal/GU negative Renal ROS  negative genitourinary   Musculoskeletal negative musculoskeletal ROS (+)   Abdominal   Peds  Hematology negative hematology ROS (+)   Anesthesia Other Findings Full set of crowns and implants, dental advisory given  Reproductive/Obstetrics negative OB ROS                        Anesthesia Physical  Anesthesia Plan  ASA: II  Anesthesia Plan: General   Post-op Pain Management:    Induction: Intravenous  Airway Management Planned: LMA  Additional Equipment:   Intra-op Plan:   Post-operative Plan:   Informed Consent: I have reviewed the patients History and Physical, chart, labs and discussed the procedure including the risks, benefits and alternatives for the proposed anesthesia with the patient or authorized representative who has indicated his/her understanding and acceptance.   Dental Advisory Given  Plan Discussed with: CRNA, Surgeon and Anesthesiologist  Anesthesia Plan Comments:        Anesthesia Quick Evaluation

## 2014-07-25 NOTE — Op Note (Signed)
PATIENT:  Brian Benton  PRE-OPERATIVE DIAGNOSIS: History of high-grade transitional cell carcinoma of the bladder  POST-OPERATIVE DIAGNOSIS: Same  PROCEDURE: Cystoscopy with bladder biopsy and fulguration  SURGEON:  [name]  INDICATION: Brian Benton is a 59 year old male who had a large papillary tumor resected from the bladder on 05/23/14. The pathology revealed high-grade transitional cell carcinoma with muscularis present and negative for invasion (Ta,G3). He is brought back to the operating room today for repeat bladder biopsies.  ANESTHESIA:  General  EBL:  Minimal  DRAINS: None  LOCAL MEDICATIONS USED:  None  SPECIMEN: Cold cup biopsies from the posterior wall superiorly on the left-hand side and from areas surrounding and including his previous site of resection.   Description of procedure: After informed consent the patient was taken to the operating room and placed on the table in a supine position. General anesthesia was then administered. Once fully anesthetized the patient was moved to the dorsal lithotomy position and the genitalia were sterilely prepped and draped in standard fashion. An official timeout was then performed.  Initially the 26 French resectoscope sheath with visual obturator and 12 lens was then passed down the urethra which was noted be normal. The prostatic urethra was free of any lesions with mild bilobar hypertrophy. The bladder was then entered and fully and systematically inspected. I noted the area of previous resection had no definite papillary tumors. There were 2 minute areas that appeared to possibly be early papillary tumors up on the left posterior wall. They measured approximately 1 mm in size. There was some erythema in the area where his previous tumor was resected that appeared to be healing mucosa. Finally the right orifice was normal and I did identify the left orifice and was able to confirm clear reflux and a widely patent  orifice.  I inserted the cold cup biopsy forceps and obtained a biopsy from the 21 mm lesions on the posterior left wall of the bladder. I then obtained biopsies from 3 different locations surrounding and including the area of his previous resection. I used the Bugbee electrode then to fulgurate the biopsy sites and a couple of areas that appeared to possibly be suspicious because of the presence of mild erythema near the previous resection site. I then drained the bladder and the patient was awakened and taken to the recovery room in stable and satisfactory condition. He tolerated the procedure well no intraoperative complications.  PLAN OF CARE: Discharge to home after PACU  PATIENT DISPOSITION:  PACU - hemodynamically stable.

## 2014-07-25 NOTE — Discharge Instructions (Signed)
Cystoscopy patient instructions  Following a cystoscopy, a catheter (a flexible rubber tube) is sometimes left in place to empty the bladder. This may cause some discomfort or a feeling that you need to urinate. Your doctor determines the period of time that the catheter will be left in place. You may have bloody urine for two to three days (Call your doctor if the amount of bleeding increases or does not subside).  You may pass blood clots in your urine, especially if you had a biopsy. It is not unusual to pass small blood clots and have some bloody urine a couple of weeks after your cystoscopy. Again, call your doctor if the bleeding does not subside. You may have: Dysuria (painful urination) Frequency (urinating often) Urgency (strong desire to urinate)  These symptoms are common especially if medicine is instilled into the bladder or a ureteral stent is placed. Avoiding alcohol and caffeine, such as coffee, tea, and chocolate, may help relieve these symptoms. Drink plenty of water, unless otherwise instructed. Your doctor may also prescribe an antibiotic or other medicine to reduce these symptoms.  Cystoscopy results are available soon after the procedure; biopsy results usually take two to four days. Your doctor will discuss the results of your exam with you. Before you go home, you will be given specific instructions for follow-up care. Special Instructions:  1 If you are going home with a catheter in place do not take a tub bath until removed by your doctor.  2 You may resume your normal activities.  3 Do not drive or operate machinery if you are taking narcotic pain medicine.  4 Be sure to keep all follow-up appointments with your doctor.   5 Call Your Doctor If: The catheter is not draining  You have severe pain  You are unable to urinate  You have a fever over 101  You have severe bleeding                                                    Transurethral  Procedure  Medications: Resume all your other meds from home  Activity: 1. No heavy lifting > 10 pounds for 2 weeks. 2. No sexual activity for 2 weeks. 3. No strenuous activity for 2 weeks. 4. No driving while on narcotic pain medications. 5. Drink plenty of water. 6. Continue to walk at home - you can still get blood clots when you are at home so keep     active but don't over do it. 7. Your urine may have some blood in it - make sure you drink plenty of water. Call or           come to the ER immediately if you catheter stops draining or you are unable to urinate.  Bathing: You can shower. You can take a bath unless you have a foley catheter in place.  Signs / Symptoms to call: 1. Call if you have a fever greater than 101.5  2. Uncontrolled nausea / vomiting, uncontrolled pain / dizziness, unable to urinate, leg         swelling / leg pain, or any other concerns.   You can reach [place] at [place] Instructions  Activity: Get plenty of rest for the remainder of the day. A responsible adult should stay with you for 24  hours following the procedure.  For the next 24 hours, DO NOT: -Drive a car -Paediatric nurse -Drink alcoholic beverages -Take any medication unless instructed by your physician -Make any legal decisions or sign important papers.  Meals: Start with liquid foods such as gelatin or soup. Progress to regular foods as tolerated. Avoid greasy, spicy, heavy foods. If nausea and/or vomiting occur, drink only clear liquids until the nausea and/or vomiting subsides. Call your physician if vomiting continues.  Special Instructions/Symptoms: Your throat may feel dry or sore from the anesthesia or the breathing tube placed in your throat during surgery. If this causes discomfort, gargle with warm salt water. The discomfort should disappear within 24 hours.

## 2014-07-28 ENCOUNTER — Encounter (HOSPITAL_BASED_OUTPATIENT_CLINIC_OR_DEPARTMENT_OTHER): Payer: Self-pay | Admitting: Urology

## 2014-11-18 ENCOUNTER — Other Ambulatory Visit: Payer: Self-pay | Admitting: Urology

## 2014-11-19 ENCOUNTER — Encounter (HOSPITAL_BASED_OUTPATIENT_CLINIC_OR_DEPARTMENT_OTHER): Payer: Self-pay | Admitting: *Deleted

## 2014-11-20 ENCOUNTER — Encounter (HOSPITAL_BASED_OUTPATIENT_CLINIC_OR_DEPARTMENT_OTHER): Payer: Self-pay | Admitting: *Deleted

## 2014-11-20 NOTE — Progress Notes (Signed)
NPO AFTER MN.  ARRIVE AT 0600.  NEEDS HG.  

## 2014-11-28 ENCOUNTER — Ambulatory Visit (HOSPITAL_BASED_OUTPATIENT_CLINIC_OR_DEPARTMENT_OTHER): Payer: 59 | Admitting: Anesthesiology

## 2014-11-28 ENCOUNTER — Encounter (HOSPITAL_BASED_OUTPATIENT_CLINIC_OR_DEPARTMENT_OTHER)
Admission: RE | Disposition: A | Discharge status: Home / Self Care | Payer: Self-pay | Source: Ambulatory Visit | Attending: Urology

## 2014-11-28 ENCOUNTER — Ambulatory Visit (HOSPITAL_BASED_OUTPATIENT_CLINIC_OR_DEPARTMENT_OTHER)
Admission: RE | Admit: 2014-11-28 | Discharge: 2014-11-28 | Disposition: A | Discharge status: Home / Self Care | Payer: 59 | Source: Ambulatory Visit | Attending: Urology | Admitting: Urology

## 2014-11-28 ENCOUNTER — Encounter (HOSPITAL_BASED_OUTPATIENT_CLINIC_OR_DEPARTMENT_OTHER): Payer: Self-pay

## 2014-11-28 DIAGNOSIS — Z8551 Personal history of malignant neoplasm of bladder: Secondary | ICD-10-CM

## 2014-11-28 DIAGNOSIS — F17210 Nicotine dependence, cigarettes, uncomplicated: Secondary | ICD-10-CM | POA: Insufficient documentation

## 2014-11-28 DIAGNOSIS — N3020 Other chronic cystitis without hematuria: Secondary | ICD-10-CM | POA: Insufficient documentation

## 2014-11-28 DIAGNOSIS — Z79899 Other long term (current) drug therapy: Secondary | ICD-10-CM | POA: Diagnosis not present

## 2014-11-28 DIAGNOSIS — C679 Malignant neoplasm of bladder, unspecified: Secondary | ICD-10-CM | POA: Insufficient documentation

## 2014-11-28 HISTORY — PX: CYSTOSCOPY WITH BIOPSY: SHX5122

## 2014-11-28 HISTORY — DX: Personal history of malignant neoplasm of bladder: Z85.51

## 2014-11-28 LAB — POCT HEMOGLOBIN-HEMACUE: Hemoglobin: 12.9 g/dL — ABNORMAL LOW (ref 13.0–17.0)

## 2014-11-28 SURGERY — CYSTOSCOPY, WITH BIOPSY
Anesthesia: General | Site: Bladder

## 2014-11-28 MED ORDER — KETOROLAC TROMETHAMINE 30 MG/ML IJ SOLN
INTRAMUSCULAR | Status: DC | PRN
  Administered 2014-11-28: 30 mg via INTRAVENOUS

## 2014-11-28 MED ORDER — FENTANYL CITRATE (PF) 100 MCG/2ML IJ SOLN
25.0000 ug | INTRAMUSCULAR | Status: DC | PRN
  Filled 2014-11-28: qty 1

## 2014-11-28 MED ORDER — MIDAZOLAM HCL 5 MG/5ML IJ SOLN
INTRAMUSCULAR | Status: DC | PRN
  Administered 2014-11-28 (×2): 1 mg via INTRAVENOUS

## 2014-11-28 MED ORDER — FENTANYL CITRATE (PF) 100 MCG/2ML IJ SOLN
INTRAMUSCULAR | Status: DC | PRN
  Administered 2014-11-28: 12.5 ug via INTRAVENOUS
  Administered 2014-11-28: 25 ug via INTRAVENOUS
  Administered 2014-11-28: 12.5 ug via INTRAVENOUS
  Administered 2014-11-28: 25 ug via INTRAVENOUS
  Administered 2014-11-28: 50 ug via INTRAVENOUS
  Administered 2014-11-28: 25 ug via INTRAVENOUS
  Administered 2014-11-28 (×2): 12.5 ug via INTRAVENOUS
  Administered 2014-11-28: 25 ug via INTRAVENOUS

## 2014-11-28 MED ORDER — CIPROFLOXACIN IN D5W 200 MG/100ML IV SOLN
INTRAVENOUS | Status: AC
  Filled 2014-11-28: qty 100

## 2014-11-28 MED ORDER — PHENAZOPYRIDINE HCL 200 MG PO TABS
200.0000 mg | ORAL_TABLET | Freq: Once | ORAL | Status: DC
  Filled 2014-11-28: qty 1

## 2014-11-28 MED ORDER — SODIUM CHLORIDE 0.9 % IR SOLN
Status: DC | PRN
  Administered 2014-11-28: 500 mL

## 2014-11-28 MED ORDER — FENTANYL CITRATE (PF) 100 MCG/2ML IJ SOLN
INTRAMUSCULAR | Status: AC
  Filled 2014-11-28: qty 6

## 2014-11-28 MED ORDER — MEPERIDINE HCL 25 MG/ML IJ SOLN
6.2500 mg | INTRAMUSCULAR | Status: DC | PRN
  Filled 2014-11-28: qty 1

## 2014-11-28 MED ORDER — MIDAZOLAM HCL 2 MG/2ML IJ SOLN
INTRAMUSCULAR | Status: AC
  Filled 2014-11-28: qty 2

## 2014-11-28 MED ORDER — ACETAMINOPHEN 10 MG/ML IV SOLN
INTRAVENOUS | Status: DC | PRN
  Administered 2014-11-28: 1000 mg via INTRAVENOUS

## 2014-11-28 MED ORDER — PROMETHAZINE HCL 25 MG/ML IJ SOLN
6.2500 mg | INTRAMUSCULAR | Status: DC | PRN
  Filled 2014-11-28: qty 1

## 2014-11-28 MED ORDER — PROPOFOL 10 MG/ML IV BOLUS
INTRAVENOUS | Status: DC | PRN
  Administered 2014-11-28: 200 mg via INTRAVENOUS

## 2014-11-28 MED ORDER — LACTATED RINGERS IV SOLN
INTRAVENOUS | Status: DC
Start: 2014-11-28 — End: 2014-11-28
  Administered 2014-11-28: 07:00:00 via INTRAVENOUS
  Filled 2014-11-28: qty 1000

## 2014-11-28 MED ORDER — LIDOCAINE HCL (CARDIAC) 20 MG/ML IV SOLN
INTRAVENOUS | Status: DC | PRN
  Administered 2014-11-28: 100 mg via INTRAVENOUS

## 2014-11-28 MED ORDER — DEXAMETHASONE SODIUM PHOSPHATE 10 MG/ML IJ SOLN
INTRAMUSCULAR | Status: DC | PRN
  Administered 2014-11-28: 10 mg via INTRAVENOUS

## 2014-11-28 MED ORDER — HYDROCODONE-ACETAMINOPHEN 10-325 MG PO TABS: 1.0000 | ORAL_TABLET | ORAL | Status: DC | PRN

## 2014-11-28 MED ORDER — PHENAZOPYRIDINE HCL 200 MG PO TABS: 200.0000 mg | ORAL_TABLET | Freq: Three times a day (TID) | ORAL | Status: DC | PRN

## 2014-11-28 MED ORDER — STERILE WATER FOR IRRIGATION IR SOLN
Status: DC | PRN
  Administered 2014-11-28 (×2): 3000 mL

## 2014-11-28 MED ORDER — CIPROFLOXACIN IN D5W 200 MG/100ML IV SOLN
200.0000 mg | INTRAVENOUS | Status: AC
  Administered 2014-11-28: 200 mg via INTRAVENOUS
  Filled 2014-11-28: qty 100

## 2014-11-28 MED ORDER — LACTATED RINGERS IV SOLN
INTRAVENOUS | Status: DC
Start: 2014-11-28 — End: 2014-11-28
  Administered 2014-11-28: 09:00:00 via INTRAVENOUS
  Filled 2014-11-28: qty 1000

## 2014-11-28 MED ORDER — ONDANSETRON HCL 4 MG/2ML IJ SOLN
INTRAMUSCULAR | Status: DC | PRN
  Administered 2014-11-28: 4 mg via INTRAVENOUS

## 2014-11-28 SURGICAL SUPPLY — 18 items
BAG DRAIN URO-CYSTO SKYTR STRL (DRAIN) ×2 IMPLANT
CANISTER SUCT LVC 12 LTR MEDI- (MISCELLANEOUS) ×2 IMPLANT
CLOTH BEACON ORANGE TIMEOUT ST (SAFETY) ×2 IMPLANT
ELECT REM PT RETURN 9FT ADLT (ELECTROSURGICAL) ×2
ELECTRODE REM PT RTRN 9FT ADLT (ELECTROSURGICAL) ×1 IMPLANT
GLOVE BIO SURGEON STRL SZ7 (GLOVE) ×4 IMPLANT
GLOVE BIO SURGEON STRL SZ8 (GLOVE) ×2 IMPLANT
GLOVE BIOGEL PI IND STRL 7.0 (GLOVE) ×2 IMPLANT
GLOVE BIOGEL PI INDICATOR 7.0 (GLOVE) ×2
GOWN STRL REUS W/ TWL LRG LVL3 (GOWN DISPOSABLE) ×1 IMPLANT
GOWN STRL REUS W/ TWL XL LVL3 (GOWN DISPOSABLE) ×2 IMPLANT
GOWN STRL REUS W/TWL LRG LVL3 (GOWN DISPOSABLE) ×1
GOWN STRL REUS W/TWL XL LVL3 (GOWN DISPOSABLE) ×2
MANIFOLD NEPTUNE II (INSTRUMENTS) IMPLANT
NS IRRIG 500ML POUR BTL (IV SOLUTION) ×2 IMPLANT
PACK CYSTO (CUSTOM PROCEDURE TRAY) ×2 IMPLANT
SYRINGE IRR TOOMEY STRL 70CC (SYRINGE) ×2 IMPLANT
WATER STERILE IRR 3000ML UROMA (IV SOLUTION) ×4 IMPLANT

## 2014-11-28 NOTE — Anesthesia Postprocedure Evaluation (Signed)
  Anesthesia Post-op Note  Patient: Brian Benton  Procedure(s) Performed: Procedure(s) (LRB): CYSTOSCOPY WITH  BLADDER BIOPSY (N/A)  Patient Location: PACU  Anesthesia Type: General  Level of Consciousness: awake and alert   Airway and Oxygen Therapy: Patient Spontanous Breathing  Post-op Pain: mild  Post-op Assessment: Post-op Vital signs reviewed, Patient's Cardiovascular Status Stable, Respiratory Function Stable, Patent Airway and No signs of Nausea or vomiting  Last Vitals:  Filed Vitals:   11/28/14 0927  BP: 103/89  Pulse: 59  Temp: 36.5 C  Resp: 16    Post-op Vital Signs: stable   Complications: No apparent anesthesia complications

## 2014-11-28 NOTE — Op Note (Signed)
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 Bladder barbotage for cytology 2. Bladder biopsies  SURGEON:  [name]  INDICATION: DHAIRYA Benton is a 59 year old male who was diagnosed with transitional cell carcinoma at the time of transurethral resection of a large bladder tumor involving the left wall the bladder. A second look revealed the presence of continued cancer which was completely resected. Pathology was Ta, G3 any therefore underwent an induction course of BCG. He is brought back to the operating room for repeat bladder biopsy.  ANESTHESIA:  General  EBL:  Minimal  DRAINS: None  LOCAL MEDICATIONS USED:  None  SPECIMEN:  1. Barbotaged bladder specimen for cytology. 2. Cold cup biopsies  Description of procedure: After informed consent the patient was taken to the operating room and placed on the table in a supine position. General anesthesia was then administered. Once fully anesthetized the patient was moved to the dorsal lithotomy position and the genitalia were sterilely prepped and draped in standard fashion. An official timeout was then performed.  The 23 French rigid cystoscope with 30 lens was then passed under direct vision down the urethra which was noted be entirely normal. The prostatic urethra was also noted to be normal. The bladder was entered and fully and systematically inspected with both the 30 and 70 lenses. There was 1+ trabeculation noted. Ureteral orifices were of normal configuration and position with some slight distortion of the left UO. An old scar was seen involving the floor and left wall of the bladder. There was a single 1 mm erythematous area on the posterior floor of the bladder. No papillary lesions were identified.  Through the rigid cystoscope the Toomey syringe was used to barbotage the bladder with normal saline and  this was sent for cytology. I then inserted the Tober biopsy forceps and obtained cold cup biopsies from the bladder. I also biopsied the 1 mm erythematous lesion on the posterior floor of the bladder and fulgurated this area. No bleeding was noted at the end of the procedure and the bladder was therefore emptied and the patient was awakened and taken to the recovery room in stable and satisfactory condition. He tolerated the procedure well with no intraoperative complications.   PLAN OF CARE: Discharge to home after PACU  PATIENT DISPOSITION:  PACU - hemodynamically stable.

## 2014-11-28 NOTE — Transfer of Care (Signed)
Immediate Anesthesia Transfer of Care Note  Patient: Brian Benton  Procedure(s) Performed: Procedure(s) (LRB): CYSTOSCOPY WITH  BLADDER BIOPSY (N/A)  Patient Location: PACU  Anesthesia Type: General  Level of Consciousness: awake, sedated, patient cooperative and responds to stimulation  Airway & Oxygen Therapy: Patient Spontanous Breathing and Patient connected to face mask oxygen  Post-op Assessment: Report given to PACU RN, Post -op Vital signs reviewed and stable and Patient moving all extremities  Post vital signs: Reviewed and stable  Complications: No apparent anesthesia complications

## 2014-11-28 NOTE — Anesthesia Preprocedure Evaluation (Addendum)
Anesthesia Evaluation  Patient identified by MRN, date of birth, ID band Patient awake    Reviewed: Allergy & Precautions, H&P , NPO status , Patient's Chart, lab work & pertinent test results  Airway Mallampati: I  TM Distance: >3 FB Neck ROM: Full    Dental  (+) Dental Advisory Given, Caps,  Bridge across all front upper teeth:   Pulmonary Current Smoker,  breath sounds clear to auscultation  Pulmonary exam normal       Cardiovascular Exercise Tolerance: Good negative cardio ROS Normal cardiovascular examRhythm:Regular Rate:Normal     Neuro/Psych negative neurological ROS  negative psych ROS   GI/Hepatic negative GI ROS, Neg liver ROS,   Endo/Other  negative endocrine ROS  Renal/GU negative Renal ROS  negative genitourinary   Musculoskeletal negative musculoskeletal ROS (+)   Abdominal   Peds  Hematology negative hematology ROS (+)   Anesthesia Other Findings Full set of crowns and implants, dental advisory given  Reproductive/Obstetrics negative OB ROS                             Anesthesia Physical  Anesthesia Plan  ASA: II  Anesthesia Plan: General   Post-op Pain Management:    Induction: Intravenous  Airway Management Planned: LMA  Additional Equipment:   Intra-op Plan:   Post-operative Plan:   Informed Consent: I have reviewed the patients History and Physical, chart, labs and discussed the procedure including the risks, benefits and alternatives for the proposed anesthesia with the patient or authorized representative who has indicated his/her understanding and acceptance.   Dental Advisory Given  Plan Discussed with: CRNA, Surgeon and Anesthesiologist  Anesthesia Plan Comments:         Anesthesia Quick Evaluation

## 2014-11-28 NOTE — Anesthesia Procedure Notes (Signed)
Procedure Name: LMA Insertion Date/Time: 11/28/2014 7:31 AM Performed by: Justice Rocher Pre-anesthesia Checklist: Patient identified, Emergency Drugs available, Suction available and Patient being monitored Patient Re-evaluated:Patient Re-evaluated prior to inductionOxygen Delivery Method: Circle System Utilized Preoxygenation: Pre-oxygenation with 100% oxygen Intubation Type: IV induction Ventilation: Mask ventilation without difficulty LMA: LMA inserted LMA Size: 4.0 Number of attempts: 1 Airway Equipment and Method: Bite block Placement Confirmation: positive ETCO2 Tube secured with: Tape Dental Injury: Teeth and Oropharynx as per pre-operative assessment

## 2014-11-28 NOTE — H&P (Signed)
Brian Benton is a 59 year old male with a history of bladder cancer.  History of Present Illness     Transitional cell carcinoma of the bladder: He experienced gross hematuria for approximately 2 weeks and was evaluated and found to have microscopic hematuria. A KUB was obtained and read as possibly a stone in the right kidney. He has a 13-pack-year smoking history.  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    He reports that he was involved in martial arts and ruptured his right testicle. His also had a vasectomy.     Interval Hx: He did well after his bladder biopsies with no complications and has no complaints today.   Surgical History Problems  1. History of Cystoscopy With Fulguration Medium Lesion (2-5cm) 2. History of Destruction Of External Hemorrhoids  Current Meds 1. Pyridium 200 MG Oral Tablet; TAKE 1 TABLET 3 times daily PRN;  Therapy: 51VOH6073 to (Evaluate:10Jan2016)  Requested for: 71GGY6948; Last  Rx:11Dec2015 Ordered 2. Tolterodine Tartrate ER 4 MG Oral Capsule Extended Release 24 Hour; TAKE 1  CAPSULE DAILY;  Therapy: 54OEV0350 to (Evaluate:10Jan2016)  Requested for: 09FGH8299; Last  Rx:11Dec2015 Ordered  Allergies Medication  1. No Known Drug Allergies  Family History Problems  1. Family history of leukemia (Z80.6) : Mother  Social History Problems  1. Alcohol use (Z78.9)   3 to 4 servings a week - drinks socially 2. Caffeine use (F15.90)   3 servings a day 3. Current every day smoker (F17.200)   in the process of quitting, switching to E-cigs 4. Married 5. Occupation   Optometrist Vital Signs  Height: 6 ft  Weight: 175 lb  BMI Calculated: 23.73 BSA Calculated: 2.01 Blood Pressure: 128 / 76 Heart Rate: 64  Review of Systems Genitourinary, constitutional, skin, eye, otolaryngeal, hematologic/lymphatic, cardiovascular, pulmonary, endocrine,  musculoskeletal, gastrointestinal, neurological and psychiatric system(s) were reviewed and pertinent findings if present are noted.      Physical Exam Constitutional: Well nourished and well developed . No acute distress.  ENT:. The ears and nose are normal in appearance.  Neck: The appearance of the neck is normal and no neck mass is present.  Pulmonary: No respiratory distress and normal respiratory rhythm and effort.  Cardiovascular: Heart rate and rhythm are normal . No peripheral edema.  Abdomen: The abdomen is soft and nontender. No masses are palpated. No CVA tenderness. No hernias are palpable. No hepatosplenomegaly noted.  Rectal: Rectal exam demonstrates normal sphincter tone, no tenderness and no masses. Estimated prostate size is 1+. The prostate has no nodularity and is not tender. The left seminal vesicle is nonpalpable. The right seminal vesicle is nonpalpable. The perineum is normal on inspection.  Genitourinary: Examination of the penis demonstrates no discharge, no masses, no lesions and a normal meatus. The penis is circumcised. The scrotum is without lesions. The right epididymis is palpably normal and non-tender. The left epididymis is palpably normal and non-tender. The right testis is atrophic, but non-tender and without masses. The left testis is normal, non-tender and without masses.  Lymphatics: The femoral and inguinal nodes are not enlarged or tender.  Skin: Normal skin turgor, no visible rash and no visible skin lesions.  Neuro/Psych:. Mood and affect are appropriate.   Assessment Assessed  1. Malignant neoplasm of other specified sites of bladder (C67.9)  I had a long discussion with the patient and his daughter today about my previous findings. The area where I previously  resected this tumor showed no evidence of residual cancer but there was an area on the posterior wall of the bladder that appeared slightly abnormal that was not appreciated when I resected his  large tumor initially. This was biopsied and did reveal the presence of high-grade transitional cell carcinoma.  We then discussed the fact that he was going to need adjunctive therapy and I have discussed BCG installations with him. We went over the nature of these installations and how BCG works. I've discussed all of the potential side effects and risks of the medication as well as the 6 week treatment course. We then discussed the need to go in and repeat bladder biopsies after completing his course of BCG. We discussed the fact that he is at risk of recurrence which is probably higher than his risk of progression. I also discussed the need for surveillance cystoscopy and outlined the frequency which this would need to be performed.     Plan  Now that he has completed his course of intravesical BCG he will undergo repeat bladder biopsies.

## 2014-11-28 NOTE — Discharge Instructions (Signed)

## 2014-12-01 ENCOUNTER — Encounter (HOSPITAL_BASED_OUTPATIENT_CLINIC_OR_DEPARTMENT_OTHER): Payer: Self-pay | Admitting: Urology

## 2016-12-20 DIAGNOSIS — Z8551 Personal history of malignant neoplasm of bladder: Secondary | ICD-10-CM | POA: Diagnosis not present

## 2016-12-20 DIAGNOSIS — N400 Enlarged prostate without lower urinary tract symptoms: Secondary | ICD-10-CM | POA: Diagnosis not present

## 2017-01-18 DIAGNOSIS — Z1329 Encounter for screening for other suspected endocrine disorder: Secondary | ICD-10-CM | POA: Diagnosis not present

## 2017-01-18 DIAGNOSIS — Z0000 Encounter for general adult medical examination without abnormal findings: Secondary | ICD-10-CM | POA: Diagnosis not present

## 2017-01-18 DIAGNOSIS — Z136 Encounter for screening for cardiovascular disorders: Secondary | ICD-10-CM | POA: Diagnosis not present

## 2017-01-18 DIAGNOSIS — Z131 Encounter for screening for diabetes mellitus: Secondary | ICD-10-CM | POA: Diagnosis not present

## 2017-02-09 DIAGNOSIS — J0100 Acute maxillary sinusitis, unspecified: Secondary | ICD-10-CM | POA: Diagnosis not present

## 2017-03-03 ENCOUNTER — Encounter: Payer: Self-pay | Admitting: Family Medicine

## 2017-04-21 DIAGNOSIS — E785 Hyperlipidemia, unspecified: Secondary | ICD-10-CM | POA: Diagnosis not present

## 2017-07-13 DIAGNOSIS — Z8551 Personal history of malignant neoplasm of bladder: Secondary | ICD-10-CM | POA: Diagnosis not present

## 2018-03-05 DIAGNOSIS — E785 Hyperlipidemia, unspecified: Secondary | ICD-10-CM | POA: Diagnosis not present

## 2018-03-05 DIAGNOSIS — Z0000 Encounter for general adult medical examination without abnormal findings: Secondary | ICD-10-CM | POA: Diagnosis not present

## 2018-06-15 DIAGNOSIS — E785 Hyperlipidemia, unspecified: Secondary | ICD-10-CM | POA: Diagnosis not present

## 2018-08-07 DIAGNOSIS — M4722 Other spondylosis with radiculopathy, cervical region: Secondary | ICD-10-CM | POA: Diagnosis not present

## 2018-08-07 DIAGNOSIS — M25512 Pain in left shoulder: Secondary | ICD-10-CM | POA: Diagnosis not present

## 2018-08-07 DIAGNOSIS — M4692 Unspecified inflammatory spondylopathy, cervical region: Secondary | ICD-10-CM | POA: Diagnosis not present

## 2018-08-08 DIAGNOSIS — M5412 Radiculopathy, cervical region: Secondary | ICD-10-CM | POA: Diagnosis not present

## 2018-08-08 DIAGNOSIS — M542 Cervicalgia: Secondary | ICD-10-CM | POA: Diagnosis not present

## 2018-08-09 DIAGNOSIS — Z8551 Personal history of malignant neoplasm of bladder: Secondary | ICD-10-CM | POA: Diagnosis not present

## 2018-08-09 DIAGNOSIS — N400 Enlarged prostate without lower urinary tract symptoms: Secondary | ICD-10-CM | POA: Diagnosis not present

## 2018-09-27 NOTE — Progress Notes (Addendum)
[facility] Note  09/28/2018     CHIEF COMPLAINT Patient presents for Retina Evaluation   HISTORY OF PRESENT ILLNESS: Brian Benton is a 63 y.o. male who presents to the clinic today for:   HPI    Retina Evaluation    In both eyes.  This started 5 days ago.  Associated Symptoms Blind Spot and Photophobia.  Negative for Flashes, Pain, Trauma, Fever, Weight Loss, Scalp Tenderness, Redness, Floaters, Distortion, Jaw Claudication, Glare, Shoulder/Hip pain and Fatigue.  Context:  distance vision, mid-range vision and near vision.  Treatments tried include no treatments.  I, the attending physician,  performed the HPI with the patient and updated documentation appropriately.          Comments    Referral of [facility] for retina eval. Patient states on Monday (09/24/2018) he woke up not able to see anything lower part of OS, he is also experiencing light sensitivity OS, denies flashes ,floaters and ocular pain. Pt is using SyStane gtt's PRN.       Last edited by Bernarda Caffey, MD on 09/28/2018 10:27 AM. (History)    pt saw Dr. [place] on Tues/Weds of this week, he states he woke up Monday morning and the vision in the bottom of his left eye seemed like it was all blocked, pt states he only takes medication for cholesterol, pt denies chest pain, numbness or tingling, he states he sees a dr on a regular basis and everything is usually okay, he states his blood pressure is never high, he states there is no family medical hx of any cardiac problems, he states he runs and works out every day, pt states he had bladder cancer several years ago and he sees a urologist once a year and everything has been clean since he completed treatments  Referring physician: [place], Heather, [address], [place] [id number]  HISTORICAL INFORMATION:   Selected notes from the [facility] Referred by [facility] for concern of BRAO LEE: 04.15.20 () [BCVA: OD:  OS:] Ocular Hx- PMH-bladder cancer    CURRENT MEDICATIONS: No current outpatient medications on file. (Ophthalmic Drugs)   No current facility-administered medications for this visit.  (Ophthalmic Drugs)   Current Outpatient Medications (Other)  Medication Sig  . rosuvastatin (CRESTOR) 20 MG tablet Take 20 mg by mouth daily.  Marland Kitchen HYDROcodone-acetaminophen (NORCO) 10-325 MG per tablet Take 1-2 tablets by mouth every 4 (four) hours as needed for moderate pain. Maximum dose per 24 hours - 8 pills (Patient not taking: Reported on 09/28/2018)  . phenazopyridine (PYRIDIUM) 200 MG tablet Take 1 tablet (200 mg total) by mouth 3 (three) times daily as needed for pain. (Patient not taking: Reported on 09/28/2018)   No current facility-administered medications for this visit.  (Other)      REVIEW OF SYSTEMS: ROS    Positive for: Eyes   Negative for: Constitutional, Gastrointestinal, Neurological, Skin, Genitourinary, Musculoskeletal, HENT, Endocrine, Cardiovascular, Respiratory, Psychiatric, Allergic/Imm, Heme/Lymph   Last edited by Zenovia Jordan, LPN on 2/68/3419  6:22 AM. (History)       ALLERGIES No Known Allergies  PAST MEDICAL HISTORY Past Medical History:  Diagnosis Date  . History of bladder cancer    s/p  TURBT 07-25-2014   Past Surgical History:  Procedure Laterality Date  . BLADDER SURGERY     [place]  . CYSTECTOMY    . CYSTOSCOPY WITH BIOPSY N/A 07/25/2014   Procedure: CYSTOSCOPY WITH BLADDER BIOPSY  AND FULGERATION;  Surgeon: [name], MD;  Location: [facility];  Service: Urology;  Laterality: N/A;  . CYSTOSCOPY WITH BIOPSY N/A 11/28/2014   Procedure: CYSTOSCOPY WITH  BLADDER BIOPSY;  Surgeon: [name], MD;  Location[facility];  Service: Urology;  Laterality: N/A;  . HEMORRHOID SURGERY  03/15/2012   Procedure: HEMORRHOIDECTOMY;  Surgeon: [name], MD;  Location: [facility];  Service: General;  Laterality: N/A;  . HERNIA REPAIR     . INGUINAL HERNIA REPAIR Bilateral 2006  . TESTICLE SURGERY  2004   Ruptured Undescended Right testicle   . TRANSURETHRAL RESECTION OF BLADDER TUMOR WITH GYRUS (TURBT-GYRUS) N/A 05/23/2014   Procedure: TRANSURETHRAL RESECTION OF BLADDER TUMOR WITH GYRUS (TURBT-GYRUS);  Surgeon: [name], MD;  Location: [facility];  Service: Urology;  Laterality: N/A;    FAMILY HISTORY Family History  Problem Relation Age of Onset  . Cancer Mother        leukemia    SOCIAL HISTORY Social History   Tobacco Use  . Smoking status: Current Every Day Smoker    Packs/day: 0.50    Years: 30.00    Pack years: 15.00    Types: Cigarettes  . Smokeless tobacco: Never Used  Substance Use Topics  . Alcohol use: Yes    Comment: social  . Drug use: No         OPHTHALMIC EXAM:  Base Eye Exam    Visual Acuity (Snellen - Linear)      Right Left   Dist cc 20/25 20/70 -1   Dist ph cc NI 20/40 -1   Correction:  Glasses       Tonometry (Tonopen, 9:05 AM)      Right Left   Pressure 14 17       Pupils      Dark Light Shape React APD   Right 3 2 Round Brisk None   Left 3 2 Round Brisk None       Visual Fields      Left Right     Full   Restrictions Partial outer inferior temporal, inferior nasal deficiencies        Extraocular Movement      Right Left    Full, Ortho Full, Ortho       Neuro/Psych    Oriented x3:  Yes       Dilation    Both eyes:  1.0% Mydriacyl, 2.5% Phenylephrine @ 9:05 AM        Slit Lamp and Fundus Exam    Slit Lamp Exam      Right Left   Lids/Lashes Dermatochalasis - upper lid Dermatochalasis - upper lid   Conjunctiva/Sclera small conj cyst IT quad nasal and temporal Pinguecula, Trace Injection   Cornea 1+ Punctate epithelial erosions mild arcus, 1+ Punctate epithelial erosions   Anterior Chamber Deep and quiet Deep and quiet   Iris Round and dilated Round and dilated   Lens 2+ Nuclear sclerosis, 2+ Cortical cataract 2+ Nuclear  sclerosis, 2+ Cortical cataract   Vitreous Vitreous syneresis Vitreous syneresis       Fundus Exam      Right Left   Disc Pink and Sharp, mid temporal Peripapillary atrophy mild tilt, mild PPA, sharp rim   C/D Ratio 0.4 0.5   Macula Flat, Good foveal reflex, Retinal pigment epithelial mottling, No heme or edema Flat, good foveal reflex, mild RPE mottling and clumping mild retinal whitening superior macula, fovea spared  consistent with BRAO   Vessels mild tortuosity, mild AV crossing changes, mild Vascular attenuation Perfused, mild AV crossing changes, mild attenuation, ?HH plaque in IT arteriole IT to fovea   Periphery Attached    Attached             IMAGING AND PROCEDURES  Imaging and Procedures for @TODAY @  OCT, Retina - OU - Both Eyes       Right Eye Quality was good. Central Foveal Thickness: 273. Progression has no prior data. Findings include no SRF, normal foveal contour, no IRF.   Left Eye Quality was good. Central Foveal Thickness: 280. Progression has no prior data. Findings include normal foveal contour, no IRF, no SRF, intraretinal hyper-reflective material (Inner-retinal hyper-reflective material with mild edema superior macula, central fovea spared).   Notes *Images captured and stored on drive  Diagnosis / Impression:  OD: NFP, no IRF/SRF OS: BRAO superior macula with inner-retinal hyperreflective material and mild edema   Clinical management:  See below  Abbreviations: NFP - Normal foveal profile. CME - cystoid macular edema. PED - pigment epithelial detachment. IRF - intraretinal fluid. SRF - subretinal fluid. EZ - ellipsoid zone. ERM - epiretinal membrane. ORA - outer retinal atrophy. ORT - outer retinal tubulation. SRHM - subretinal hyper-reflective material        Fluorescein Angiography Optos (Transit OS)       Right Eye   Progression has no prior data. Early phase findings include normal observations. Mid/Late phase findings include normal  observations.   Left Eye   Progression has no prior data. Early phase findings include delayed filling. Mid/Late phase findings include staining (No leakage, no obvious embolis ).   Notes Images stored on drive;   Impression: OD: normal study OS: delayed arterial filling; delayed venous return superotemporal arcades; no active occlusion; no leakage -- compensated superior BRAO                  ASSESSMENT/PLAN:    ICD-10-CM   1. 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 Retinal edema H35.81 OCT, Retina - OU - Both Eyes  3. Essential hypertension I10   4. Hypertensive retinopathy of both eyes H35.033 Fluorescein Angiography Optos (Transit OS)  5. Combined forms of age-related cataract of both eyes H25.813     1,2. BRAO OS - acute onset, Monday, 04.13.20, by pt history - saw Dr. [place] on Wednesday, 04.15.20  - BCVA 20/40 with inferior visual field loss OS - on exam has mild whitening of superior macula with sparing of the fovea - OCT shows inner-retinal hyperreflective material and mild edema superior macula w/ sparing of the fovea - FA shows relatively good arterial perfusion, but delayed venous filling in ST quadrant - discussed findings and prognosis - recommend cardiovascular work up to r/o embolic source of this retinal artery occlusion -- echocardiogram and carotid dopplers - recommend optimization of cardiovascular risk factors with PCP and/or cardiology - will send letter to PCP -- Mat Carne, PA-C at [facility] - start brimonidine BID OS - f/u 4 weeks  3,4. Hypertensive retinopathy OU  - discussed importance of tight BP control  - monitor  5. Mixed form age related cataract  - The symptoms of cataract, surgical options, and treatments and risks were discussed with patient.  - discussed diagnosis and progression  - not yet visually significant  - monitor for now   Ophthalmic Meds  Ordered this visit:  No orders of  the defined types were placed in this encounter.      Return in about 4 weeks (around 10/26/2018) for f/u BRAO OS, DFE, OCT.  There are no Patient Instructions on file for this visit.   Explained the diagnoses, plan, and follow up with the patient and they expressed understanding.  Patient expressed understanding of the importance of proper follow up care.   This document serves as a record of services personally performed by Gardiner Sleeper, MD, PhD. It was created on their behalf by Ernest Mallick, OA, an ophthalmic assistant. The creation of this record is the provider's dictation and/or activities during the visit.    Electronically signed by: Ernest Mallick, OA  04.16.2020 12:06 PM    Gardiner Sleeper, M.D., Ph.D. Diseases & Surgery of the Retina and Vitreous [facility]  I have reviewed the above documentation for accuracy and completeness, and I agree with the above. Gardiner Sleeper, M.D., Ph.D. 09/28/18 12:14 PM    Abbreviations: M myopia (nearsighted); A astigmatism; H hyperopia (farsighted); P presbyopia; Mrx spectacle prescription;  CTL contact lenses; OD right eye; OS left eye; OU both eyes  XT exotropia; ET esotropia; PEK punctate epithelial keratitis; PEE punctate epithelial erosions; DES dry eye syndrome; MGD meibomian gland dysfunction; ATs artificial tears; PFAT's preservative free artificial tears; Benjamin nuclear sclerotic cataract; PSC posterior subcapsular cataract; ERM epi-retinal membrane; PVD posterior vitreous detachment; RD retinal detachment; DM diabetes mellitus; DR diabetic retinopathy; NPDR non-proliferative diabetic retinopathy; PDR proliferative diabetic retinopathy; CSME clinically significant macular edema; DME diabetic macular edema; dbh dot blot hemorrhages; CWS cotton wool spot; POAG primary open angle glaucoma; C/D cup-to-disc ratio; HVF humphrey visual field; GVF goldmann visual field; OCT optical  coherence tomography; IOP intraocular pressure; BRVO Branch retinal vein occlusion; CRVO central retinal vein occlusion; CRAO central retinal artery occlusion; BRAO branch retinal artery occlusion; RT retinal tear; SB scleral buckle; PPV pars plana vitrectomy; VH Vitreous hemorrhage; PRP panretinal laser photocoagulation; IVK intravitreal kenalog; VMT vitreomacular traction; MH Macular hole;  NVD neovascularization of the disc; NVE neovascularization elsewhere; AREDS age related eye disease study; ARMD age related macular degeneration; POAG primary open angle glaucoma; EBMD epithelial/anterior basement membrane dystrophy; ACIOL anterior chamber intraocular lens; IOL intraocular lens; PCIOL posterior chamber intraocular lens; Phaco/IOL phacoemulsification with intraocular lens placement; Barton photorefractive keratectomy; LASIK laser assisted in situ keratomileusis; HTN hypertension; DM diabetes mellitus; COPD chronic obstructive pulmonary disease

## 2018-09-28 ENCOUNTER — Encounter (INDEPENDENT_AMBULATORY_CARE_PROVIDER_SITE_OTHER): Payer: Self-pay | Admitting: Ophthalmology

## 2018-09-28 ENCOUNTER — Other Ambulatory Visit: Payer: Self-pay

## 2018-09-28 ENCOUNTER — Ambulatory Visit (INDEPENDENT_AMBULATORY_CARE_PROVIDER_SITE_OTHER): Payer: 59 | Admitting: Ophthalmology

## 2018-09-28 DIAGNOSIS — I10 Essential (primary) hypertension: Secondary | ICD-10-CM

## 2018-09-28 DIAGNOSIS — H35033 Hypertensive retinopathy, bilateral: Secondary | ICD-10-CM

## 2018-09-28 DIAGNOSIS — H34232 Retinal artery branch occlusion, left eye: Secondary | ICD-10-CM

## 2018-09-28 DIAGNOSIS — H3581 Retinal edema: Secondary | ICD-10-CM

## 2018-09-28 DIAGNOSIS — H25813 Combined forms of age-related cataract, bilateral: Secondary | ICD-10-CM

## 2018-10-02 ENCOUNTER — Ambulatory Visit (INDEPENDENT_AMBULATORY_CARE_PROVIDER_SITE_OTHER): Payer: 59 | Admitting: Cardiology

## 2018-10-02 ENCOUNTER — Encounter: Payer: Self-pay | Admitting: Cardiology

## 2018-10-02 ENCOUNTER — Other Ambulatory Visit: Payer: Self-pay

## 2018-10-02 VITALS — BP 118/80 | Ht 72.0 in | Wt 170.0 lb

## 2018-10-02 DIAGNOSIS — H34232 Retinal artery branch occlusion, left eye: Secondary | ICD-10-CM

## 2018-10-02 DIAGNOSIS — E782 Mixed hyperlipidemia: Secondary | ICD-10-CM

## 2018-10-02 DIAGNOSIS — Z720 Tobacco use: Secondary | ICD-10-CM | POA: Diagnosis not present

## 2018-10-02 MED ORDER — ROSUVASTATIN CALCIUM 40 MG PO TABS: 20.0000 mg | ORAL_TABLET | Freq: Every day | ORAL | 3 refills | Status: DC

## 2018-10-02 MED ORDER — ASPIRIN EC 81 MG PO TBEC: 81.0000 mg | DELAYED_RELEASE_TABLET | Freq: Every day | ORAL | 3 refills | Status: DC

## 2018-10-02 NOTE — Progress Notes (Signed)
Virtual Visit via Video Note   Subjective:   Brian Benton, male    DOB: [birth year], 63 y.o.   MRN: [id number]   I connected with the patient on 10/02/18 by a video enabled telemedicine application and verified that I am speaking with the correct person using two identifiers.     I discussed the limitations of evaluation and management by telemedicine and the availability of in person appointments. The patient expressed understanding and agreed to proceed.   This visit type was conducted due to national recommendations for restrictions regarding the COVID-19 Pandemic (e.g. social distancing).  This format is felt to be most appropriate for this patient at this time.  All issues noted in this document were discussed and addressed.  No physical exam was performed (except for noted visual exam findings with Tele health visits).  The patient has consented to conduct a Tele health visit and understands insurance will be billed.     Chief complaint:  Branch retinal artery occlusion   HPI  63 y.o. Caucasian male referred for cardiac risk factor modification in light of branch retinal artery occlusion.   Patient recently had an episode of sudden onset of vision loss in left eye lower field. 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 He has been active all his life, and runs 4 miles/day, and works out 2 hours/day. He has never had any denies chest pain, shortness of breath, palpitations, leg edema, orthopnea, PND symptoms. He is a off and on, but 30 PY smoker and continues to smoke. He wears an Apple watch, on which his heart rate runs in 40s-50s. He has never had irregular heart beat alarm on his monitor. He was started on Crestor 20 mg daily, after which his lipid panel had reportedly improved. I do not have the follow up test  results.   Past Medical History:  Diagnosis Date  . History of bladder cancer    s/p  TURBT 07-25-2014     Past Surgical History:  Procedure Laterality Date  . BLADDER SURGERY     [place]  . CYSTECTOMY    . CYSTOSCOPY WITH BIOPSY N/A 07/25/2014   Procedure: CYSTOSCOPY WITH BLADDER BIOPSY AND FULGERATION;  Surgeon: [name], MD;  Location: [facility];  Service: Urology;  Laterality: N/A;  . CYSTOSCOPY WITH BIOPSY N/A 11/28/2014   Procedure: CYSTOSCOPY WITH  BLADDER BIOPSY;  Surgeon: [name], MD;  Location: [facility];  Service: Urology;  Laterality: N/A;  . HEMORRHOID SURGERY  03/15/2012   Procedure: HEMORRHOIDECTOMY;  Surgeon: [name], MD;  Location: [facility];  Service: General;  Laterality: N/A;  . HERNIA REPAIR    . INGUINAL HERNIA REPAIR Bilateral 2006  . TESTICLE SURGERY  2004   Ruptured Undescended Right testicle   . TRANSURETHRAL RESECTION OF BLADDER TUMOR WITH GYRUS (TURBT-GYRUS) N/A 05/23/2014   Procedure: TRANSURETHRAL RESECTION OF BLADDER TUMOR WITH GYRUS (TURBT-GYRUS);  Surgeon: [name], MD;  Location: [facility];  Service: Urology;  Laterality: N/A;     Social History   Socioeconomic History  . Marital status: Married    Spouse name: Not on file  . Number of children: Not on file  . Years of education: Not on file  . Highest education level: Not on file  Occupational History  . Not  on file  Social Needs  . Financial resource strain: Not on file  . Food insecurity:    Worry: Not on file    Inability: Not on file  . Transportation needs:    Medical: Not on file    Non-medical: Not on file  Tobacco Use  . Smoking status: Current Every Day Smoker    Packs/day: 0.50    Years: 30.00    Pack years: 15.00    Types: Cigarettes  . Smokeless tobacco: Never Used  Substance and Sexual Activity  . Alcohol use: Yes    Comment: social  . Drug use: No  . Sexual activity: Not on file  Lifestyle  .  Physical activity:    Days per week: Not on file    Minutes per session: Not on file  . Stress: Not on file  Relationships  . Social connections:    Talks on phone: Not on file    Gets together: Not on file    Attends religious service: Not on file    Active member of club or organization: Not on file    Attends meetings of clubs or organizations: Not on file    Relationship status: Not on file  . Intimate partner violence:    Fear of current or ex partner: Not on file    Emotionally abused: Not on file    Physically abused: Not on file    Forced sexual activity: Not on file  Other Topics Concern  . Not on file  Social History Narrative  . Not on file     Family History  Problem Relation Age of Onset  . Cancer Mother        leukemia     Current Outpatient Medications on File Prior to Visit  Medication Sig Dispense Refill  . HYDROcodone-acetaminophen (NORCO) 10-325 MG per tablet Take 1-2 tablets by mouth every 4 (four) hours as needed for moderate pain. Maximum dose per 24 hours - 8 pills (Patient not taking: Reported on 09/28/2018) 10 tablet 0  . phenazopyridine (PYRIDIUM) 200 MG tablet Take 1 tablet (200 mg total) by mouth 3 (three) times daily as needed for pain. (Patient not taking: Reported on 09/28/2018) 10 tablet 0  . rosuvastatin (CRESTOR) 20 MG tablet Take 20 mg by mouth daily.     No current facility-administered medications on file prior to visit.     Cardiovascular studies:  None available  Recent labs: 03/05/2018: Glucose 77. BUN/Cr 9/0.73. eGFR normal. Na/K 139/4.3. Rest of the CMP normal. H/H 14/42. MCV 95. Platelets 219.   Chol 241, TG 153, HDL 29, LDL 181.  Review of Systems  Constitution: Negative for decreased appetite, malaise/fatigue, weight gain and weight loss.  HENT: Negative for congestion.   Eyes: Positive for vision loss in left eye (Partial). Negative for visual disturbance.  Cardiovascular: Negative for chest pain, dyspnea on exertion,  leg swelling, palpitations and syncope.  Respiratory: Negative for shortness of breath.   Endocrine: Negative for cold intolerance.  Hematologic/Lymphatic: Does not bruise/bleed easily.  Skin: Negative for itching and rash.  Musculoskeletal: Negative for myalgias.  Gastrointestinal: Negative for abdominal pain, nausea and vomiting.  Genitourinary: Negative for dysuria.  Neurological: Negative for dizziness and weakness.  Psychiatric/Behavioral: The patient is not nervous/anxious.   All other systems reviewed and are negative.        Vitals:   10/02/18 1353  BP: 118/80   (Measured by the patient using a home BP monitor)   Observation/findings during video  visit   Objective:    Physical Exam  Constitutional: He is oriented to person, place, and time. He appears well-developed and well-nourished. No distress.  Pulmonary/Chest: Effort normal.  Musculoskeletal:        General: No edema.  Neurological: He is alert and oriented to person, place, and time.  Psychiatric: He has a normal mood and affect.  Nursing note and vitals reviewed.         Assessment & Recommendations:   63 yo. Caucasian male with hyperlipidemia, tobacco abuse, now with branch retinal artery occlusion.   Branch retinal artery occlusion: Recommend EKG, echocardiogram, and carotid US.  Started Aspirin 81 mg daily Discussed risk factor modification, as below. If no etiology idenitified, could consider TEE and event monitor in the future.   Hyperlipidemia: Increase crestor to 40 mg daily. Repeat lipid panel in 3 months   Tobacco cessation counseling (CPT [phone]):  - Currently smoking 1 packs/day   - Patient was informed of the dangers of tobacco abuse including stroke, cancer, and MI, as well as benefits of tobacco cessation. - Patient is willing to quit at this time. - Approximately 5 mins were spent counseling patient cessation techniques. We discussed various methods to help quit smoking,  including deciding on a date to quit, joining a support group, pharmacological agents- nicotine gum/patch/lozenges, chantix. Patient would like to quit on his own. - I will reassess his progress at the next follow-up visit    Nigel Mormon, MD [facility] [place] Pager: [phone] Office: [phone] If no answer Cell [phone]

## 2018-10-03 ENCOUNTER — Encounter: Payer: Self-pay | Admitting: Cardiology

## 2018-10-03 DIAGNOSIS — E782 Mixed hyperlipidemia: Secondary | ICD-10-CM | POA: Insufficient documentation

## 2018-10-03 DIAGNOSIS — H34232 Retinal artery branch occlusion, left eye: Secondary | ICD-10-CM | POA: Insufficient documentation

## 2018-10-24 NOTE — Progress Notes (Signed)
[facility] Note  10/26/2018     CHIEF COMPLAINT Patient presents for Retina Follow Up   HISTORY OF PRESENT ILLNESS: Brian Benton is a 63 y.o. male who presents to the clinic today for:   HPI    Retina Follow Up    Patient presents with  CRVO/BRVO.  In left eye.  This started 2 weeks ago.  Since onset it is gradually improving.  I, the attending physician,  performed the HPI with the patient and updated documentation appropriately.          Comments    F/U BRAO OS. Patient states his vision is "gradually improving",but he has noticed his left eye feels as if something is in it, "It does not feel right", denies ocular pain.        Last edited by Bernarda Caffey, MD on 10/26/2018 10:38 AM. (History)    pt states his left eye feels "heavier" than normal, pt states he has been checking his blood pressure every day and it stays the same, pt state he had a virtual visit with his cardiologist and he doubled up on his crestor and added a baby aspirin daily and it's lowered his bp to about 115/75, pt states vision is unchanged from last visit, pt states he is still exercising and having no problems  Referring physician: [place], [address] [place], [place] [address]  HISTORICAL INFORMATION:   Selected notes from the MEDICAL RECORD NUMBER Referred by [facility] for concern of BRAO LEE: 04.15.20 () [BCVA: OD: OS:] Ocular Hx- PMH-bladder cancer    CURRENT MEDICATIONS: Current Outpatient Medications (Ophthalmic Drugs)  Medication Sig  . brimonidine (ALPHAGAN) 0.2 % ophthalmic solution Place 1 drop into the left eye 2 (two) times daily.   No current facility-administered medications for this visit.  (Ophthalmic Drugs)   Current Outpatient Medications (Other)  Medication Sig  . aspirin EC 81 MG tablet Take 1 tablet (81 mg total) by mouth daily.  . MULTIPLE MINERALS PO Take 1 tablet by mouth daily.  . rosuvastatin (CRESTOR) 40 MG tablet  Take 0.5 tablets (20 mg total) by mouth daily.   No current facility-administered medications for this visit.  (Other)      REVIEW OF SYSTEMS: ROS    Positive for: Eyes   Negative for: Constitutional, Gastrointestinal, Neurological, Skin, Genitourinary, Musculoskeletal, HENT, Endocrine, Cardiovascular, Respiratory, Psychiatric, Allergic/Imm, Heme/Lymph   Last edited by Zenovia Jordan, LPN on 3/53/2992 42:68 AM. (History)       ALLERGIES No Known Allergies  PAST MEDICAL HISTORY Past Medical History:  Diagnosis Date  . History of bladder cancer    s/p  TURBT 07-25-2014  . Hyperlipidemia    Past Surgical History:  Procedure Laterality Date  . BLADDER SURGERY     [place]  . CYSTECTOMY    . CYSTOSCOPY WITH BIOPSY N/A 07/25/2014   Procedure: CYSTOSCOPY WITH BLADDER BIOPSY AND FULGERATION;  Surgeon: [name], MD;  Location: [facility];  Service: Urology;  Laterality: N/A;  . CYSTOSCOPY WITH BIOPSY N/A 11/28/2014   Procedure: CYSTOSCOPY WITH  BLADDER BIOPSY;  Surgeon: [name], MD;  Location[facility];  Service: Urology;  Laterality: N/A;  . HEMORRHOID SURGERY  03/15/2012   Procedure: HEMORRHOIDECTOMY;  Surgeon: [name], MD;  Location: [facility];  Service: General;  Laterality: N/A;  . HERNIA REPAIR    . INGUINAL HERNIA REPAIR Bilateral 2006  . TESTICLE SURGERY  2004  Ruptured Undescended Right testicle   . TRANSURETHRAL RESECTION OF BLADDER TUMOR WITH GYRUS (TURBT-GYRUS) N/A 05/23/2014   Procedure: TRANSURETHRAL RESECTION OF BLADDER TUMOR WITH GYRUS (TURBT-GYRUS);  Surgeon: [name], MD;  Location: [facility];  Service: Urology;  Laterality: N/A;    FAMILY HISTORY Family History  Problem Relation Age of Onset  . Cancer Mother        leukemia  . Diabetes Brother     SOCIAL HISTORY Social History   Tobacco Use  . Smoking status: Current Every Day Smoker    Packs/day: 0.50    Years: 30.00    Pack  years: 15.00    Types: Cigarettes  . Smokeless tobacco: Never Used  Substance Use Topics  . Alcohol use: Yes    Comment: social  . Drug use: No         OPHTHALMIC EXAM:  Base Eye Exam    Visual Acuity (Snellen - Linear)      Right Left   Dist [place] 20/70 -2 20/70 +2   Dist ph [place] 20/25 20/30       Tonometry (Tonopen, 10:12 AM)      Right Left   Pressure 17 15       Pupils      Dark Light Shape React APD   Right 3 2 Round Brisk None   Left 3 2 Round Brisk None       Visual Fields      Left Right    Full        Extraocular Movement      Right Left    Full, Ortho Full, Ortho       Neuro/Psych    Oriented x3:  Yes       Dilation    Both eyes:  1.0% Mydriacyl, 2.5% Phenylephrine @ 10:08 AM        Slit Lamp and Fundus Exam    Slit Lamp Exam      Right Left   Lids/Lashes Dermatochalasis - upper lid Dermatochalasis - upper lid   Conjunctiva/Sclera small conj cyst IT quad nasal and temporal Pinguecula   Cornea 1+ Punctate epithelial erosions, pigmented scar at 0800 mild arcus, 1+ Punctate epithelial erosions   Anterior Chamber Deep and quiet Deep and quiet   Iris Round and dilated Round and dilated   Lens 2+ Nuclear sclerosis, 2+ Cortical cataract 2+ Nuclear sclerosis, 2+ Cortical cataract   Vitreous Vitreous syneresis, Posterior vitreous detachment, vitreous condensations Vitreous syneresis       Fundus Exam      Right Left   Disc Pink and Sharp, mid temporal Peripapillary atrophy mild temporal PPA, sharp rim, ?mild ST pallor   C/D Ratio 0.5 0.5   Macula Flat, Good foveal reflex, mild Retinal pigment epithelial mottling, No heme or edema Flat, good foveal reflex, mild RPE mottling and clumping, interval improvement in superior retinal whitening, fovea spared consistent with BRAO   Vessels Vascular attenuation, mild Tortuousity Perfused, mild AV crossing changes, mild attenuation, HH plaque in IT arteriole IT to fovea     Periphery Attached    Attached,  pigmented VR tuft          IMAGING AND PROCEDURES  Imaging and Procedures for @TODAY @  OCT, Retina - OU - Both Eyes       Right Eye Quality was good. Central Foveal Thickness: 277. Progression has been stable. Findings include no SRF, normal foveal contour, no IRF, vitreomacular adhesion .   Left Eye  Quality was good. Central Foveal Thickness: 277. Progression has been stable. Findings include normal foveal contour, no IRF, no SRF, intraretinal hyper-reflective material, inner retinal atrophy, vitreomacular adhesion  (Interval improvement in [facility] and interval progression of inner retinal atrophy superiorly).   Notes *Images captured and stored on drive  Diagnosis / Impression:  OD: NFP, no IRF/SRF OS: BRAO superior macula with interval improvement in [facility] and interval progression of inner retinal atrophy   Clinical management:  See below  Abbreviations: NFP - Normal foveal profile. CME - cystoid macular edema. PED - pigment epithelial detachment. IRF - intraretinal fluid. SRF - subretinal fluid. EZ - ellipsoid zone. ERM - epiretinal membrane. ORA - outer retinal atrophy. ORT - outer retinal tubulation. SRHM - subretinal hyper-reflective material        Humphrey Visual Field - OU - Both Eyes       Right Eye Reliability was good. Progression has no prior data. Foveal threshold was normal. Findings include non-specific defects.   Left Eye Reliability was good. Progression has no prior data. Foveal threshold was normal. Findings include inferior altitudinal defect.   Notes **Images stored on drive**  Impression: OD: normal study OS: inferior altitudinal defect corresponding to superior BRAO                  ASSESSMENT/PLAN:    ICD-10-CM   1. 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 Retinal edema H35.81 OCT, Retina - OU - Both Eyes  3. Essential hypertension I10   4. Hypertensive retinopathy of both  eyes H35.033   5. Combined forms of age-related cataract of both eyes H25.813   6. Visual field scotoma of left eye H53.412 Humphrey Visual Field - OU - Both Eyes    1,2. BRAO OS  - acute onset, Monday, 04.13.20, by pt history  - saw Dr. [place] on Wednesday, 04.15.20   - BCVA 20/40 with inferior visual field loss OS  - on exam had mild whitening of superior macula with sparing of the fovea  - OCT shows inner-retinal hyperreflective material and now atrophy superior macula w/ sparing of the fovea  - FA shows relatively good arterial perfusion, but delayed venous filling in ST quadrant  - HVF 24-2 shows inferior altitudinal  - discussed findings and prognosis  - recommend cardiovascular work up to r/o embolic source of this retinal artery occlusion -- echocardiogram and carotid dopplers  - recommend optimization of cardiovascular risk factors with PCP and/or cardiology  - pt had cardiology E-visit on 4.21.20 -- scheduled for cardiac and carotid testing 5.20.2020  - cont brimonidine BID OS  - f/u 3 months DFE, OCT  3,4. Hypertensive retinopathy OU  - discussed importance of tight BP control  - monitor  5. Mixed form age related cataract  - The symptoms of cataract, surgical options, and treatments and risks were discussed with patient.  - discussed diagnosis and progression  - not yet visually significant  - monitor for now   Ophthalmic Meds Ordered this visit:  Meds ordered this encounter  Medications  . brimonidine (ALPHAGAN) 0.2 % ophthalmic solution    Sig: Place 1 drop into the left eye 2 (two) times daily.    Dispense:  10 mL    Refill:  11       No follow-ups on file.  There are no Patient Instructions on file for this visit.   Explained the diagnoses, plan, and follow up with  the patient and they expressed understanding.  Patient expressed understanding of the importance of proper follow up care.   This document serves as a record of services personally performed  by Gardiner Sleeper, MD, PhD. It was created on their behalf by Ernest Mallick, OA, an ophthalmic assistant. The creation of this record is the provider's dictation and/or activities during the visit.    Electronically signed by: Ernest Mallick, OA  04.16.2020 1:38 AM    Gardiner Sleeper, M.D., Ph.D. Diseases & Surgery of the Retina and Vitreous [facility]  I have reviewed the above documentation for accuracy and completeness, and I agree with the above. Gardiner Sleeper, M.D., Ph.D. 10/27/18 1:38 AM     Abbreviations: M myopia (nearsighted); A astigmatism; H hyperopia (farsighted); P presbyopia; Mrx spectacle prescription;  CTL contact lenses; OD right eye; OS left eye; OU both eyes  XT exotropia; ET esotropia; PEK punctate epithelial keratitis; PEE punctate epithelial erosions; DES dry eye syndrome; MGD meibomian gland dysfunction; ATs artificial tears; PFAT's preservative free artificial tears; [place] nuclear sclerotic cataract; PSC posterior subcapsular cataract; ERM epi-retinal membrane; PVD posterior vitreous detachment; RD retinal detachment; DM diabetes mellitus; DR diabetic retinopathy; NPDR non-proliferative diabetic retinopathy; PDR proliferative diabetic retinopathy; CSME clinically significant macular edema; DME diabetic macular edema; dbh dot blot hemorrhages; CWS cotton wool spot; POAG primary open angle glaucoma; C/D cup-to-disc ratio; HVF humphrey visual field; GVF goldmann visual field; OCT optical coherence tomography; IOP intraocular pressure; BRVO Branch retinal vein occlusion; CRVO central retinal vein occlusion; CRAO central retinal artery occlusion; BRAO branch retinal artery occlusion; RT retinal tear; SB scleral buckle; PPV pars plana vitrectomy; VH Vitreous hemorrhage; PRP panretinal laser photocoagulation; IVK intravitreal kenalog; VMT vitreomacular traction; MH Macular hole;  NVD neovascularization of the disc; NVE neovascularization elsewhere; AREDS age  related eye disease study; ARMD age related macular degeneration; POAG primary open angle glaucoma; EBMD epithelial/anterior basement membrane dystrophy; ACIOL anterior chamber intraocular lens; IOL intraocular lens; PCIOL posterior chamber intraocular lens; Phaco/IOL phacoemulsification with intraocular lens placement; [place] photorefractive keratectomy; LASIK laser assisted in situ keratomileusis; HTN hypertension; DM diabetes mellitus; COPD chronic obstructive pulmonary disease

## 2018-10-26 ENCOUNTER — Ambulatory Visit (INDEPENDENT_AMBULATORY_CARE_PROVIDER_SITE_OTHER): Payer: 59 | Admitting: Ophthalmology

## 2018-10-26 ENCOUNTER — Encounter (INDEPENDENT_AMBULATORY_CARE_PROVIDER_SITE_OTHER): Payer: Self-pay | Admitting: Ophthalmology

## 2018-10-26 ENCOUNTER — Other Ambulatory Visit: Payer: Self-pay

## 2018-10-26 DIAGNOSIS — H53412 Scotoma involving central area, left eye: Secondary | ICD-10-CM | POA: Diagnosis not present

## 2018-10-26 DIAGNOSIS — I10 Essential (primary) hypertension: Secondary | ICD-10-CM

## 2018-10-26 DIAGNOSIS — H35033 Hypertensive retinopathy, bilateral: Secondary | ICD-10-CM

## 2018-10-26 DIAGNOSIS — H25813 Combined forms of age-related cataract, bilateral: Secondary | ICD-10-CM

## 2018-10-26 DIAGNOSIS — H34232 Retinal artery branch occlusion, left eye: Secondary | ICD-10-CM | POA: Diagnosis not present

## 2018-10-26 DIAGNOSIS — H3581 Retinal edema: Secondary | ICD-10-CM

## 2018-10-26 MED ORDER — BRIMONIDINE TARTRATE 0.2 % OP SOLN: 1.0000 [drp] | Freq: Two times a day (BID) | OPHTHALMIC | 11 refills | Status: DC

## 2018-10-31 ENCOUNTER — Encounter: Payer: Self-pay | Admitting: Cardiology

## 2018-10-31 ENCOUNTER — Ambulatory Visit (INDEPENDENT_AMBULATORY_CARE_PROVIDER_SITE_OTHER): Payer: 59 | Admitting: Cardiology

## 2018-10-31 ENCOUNTER — Other Ambulatory Visit: Payer: Self-pay

## 2018-10-31 ENCOUNTER — Ambulatory Visit (INDEPENDENT_AMBULATORY_CARE_PROVIDER_SITE_OTHER): Payer: 59

## 2018-10-31 ENCOUNTER — Ambulatory Visit: Payer: 59

## 2018-10-31 DIAGNOSIS — H34232 Retinal artery branch occlusion, left eye: Secondary | ICD-10-CM

## 2018-10-31 DIAGNOSIS — I10 Essential (primary) hypertension: Secondary | ICD-10-CM | POA: Diagnosis not present

## 2018-10-31 DIAGNOSIS — H3411 Central retinal artery occlusion, right eye: Secondary | ICD-10-CM

## 2018-10-31 NOTE — Progress Notes (Signed)
Physical Exam  Constitutional: He is oriented to person, place, and time. He appears well-developed and well-nourished. No distress.  HENT:  Head: Normocephalic and atraumatic.  Eyes: Pupils are equal, round, and reactive to light. Conjunctivae are normal.  Neck: No JVD present.  Cardiovascular: Normal rate, regular rhythm and intact distal pulses.  Pulmonary/Chest: Effort normal and breath sounds normal. He has no wheezes. He has no rales.  Abdominal: Soft. Bowel sounds are normal. There is no rebound.  Musculoskeletal:        General: No edema.  Lymphadenopathy:    He has no cervical adenopathy.  Neurological: He is alert and oriented to person, place, and time. No cranial nerve deficit.  Skin: Skin is warm and dry.  Psychiatric: He has a normal mood and affect.  Nursing note and vitals reviewed.   Event monitor placed. F/u in 6 wks.  Nigel Mormon, MD [facility]. [place] Pager: [phone] Office: [phone] If no answer Cell [phone]

## 2018-11-06 ENCOUNTER — Other Ambulatory Visit: Payer: Self-pay | Admitting: Orthopedic Surgery

## 2018-11-07 ENCOUNTER — Encounter (HOSPITAL_BASED_OUTPATIENT_CLINIC_OR_DEPARTMENT_OTHER): Payer: Self-pay | Admitting: *Deleted

## 2018-11-07 ENCOUNTER — Other Ambulatory Visit: Payer: Self-pay

## 2018-11-08 ENCOUNTER — Other Ambulatory Visit (HOSPITAL_COMMUNITY)
Admission: RE | Admit: 2018-11-08 | Discharge: 2018-11-08 | Disposition: A | Discharge status: Home / Self Care | Payer: 59 | Source: Ambulatory Visit | Attending: Orthopedic Surgery | Admitting: Orthopedic Surgery

## 2018-11-08 DIAGNOSIS — Z1159 Encounter for screening for other viral diseases: Secondary | ICD-10-CM | POA: Diagnosis present

## 2018-11-09 LAB — NOVEL CORONAVIRUS, NAA (HOSP ORDER, SEND-OUT TO REF LAB; TAT 18-24 HRS): SARS-CoV-2, NAA: NOT DETECTED

## 2018-11-12 ENCOUNTER — Other Ambulatory Visit: Payer: Self-pay

## 2018-11-12 ENCOUNTER — Ambulatory Visit (HOSPITAL_BASED_OUTPATIENT_CLINIC_OR_DEPARTMENT_OTHER): Payer: 59 | Admitting: Certified Registered Nurse Anesthetist

## 2018-11-12 ENCOUNTER — Encounter (HOSPITAL_BASED_OUTPATIENT_CLINIC_OR_DEPARTMENT_OTHER)
Admission: RE | Disposition: A | Discharge status: Home / Self Care | Payer: Self-pay | Source: Home / Self Care | Attending: Orthopedic Surgery

## 2018-11-12 ENCOUNTER — Encounter (HOSPITAL_BASED_OUTPATIENT_CLINIC_OR_DEPARTMENT_OTHER): Payer: Self-pay | Admitting: Emergency Medicine

## 2018-11-12 ENCOUNTER — Ambulatory Visit (HOSPITAL_BASED_OUTPATIENT_CLINIC_OR_DEPARTMENT_OTHER)
Admission: RE | Admit: 2018-11-12 | Discharge: 2018-11-12 | Disposition: A | Discharge status: Home / Self Care | Payer: 59 | Attending: Orthopedic Surgery | Admitting: Orthopedic Surgery

## 2018-11-12 DIAGNOSIS — Z8551 Personal history of malignant neoplasm of bladder: Secondary | ICD-10-CM | POA: Insufficient documentation

## 2018-11-12 DIAGNOSIS — L720 Epidermal cyst: Secondary | ICD-10-CM | POA: Diagnosis not present

## 2018-11-12 DIAGNOSIS — F17210 Nicotine dependence, cigarettes, uncomplicated: Secondary | ICD-10-CM | POA: Insufficient documentation

## 2018-11-12 DIAGNOSIS — E785 Hyperlipidemia, unspecified: Secondary | ICD-10-CM | POA: Insufficient documentation

## 2018-11-12 DIAGNOSIS — R2232 Localized swelling, mass and lump, left upper limb: Secondary | ICD-10-CM | POA: Diagnosis present

## 2018-11-12 HISTORY — PX: MASS EXCISION: SHX2000

## 2018-11-12 HISTORY — DX: Localized swelling, mass and lump, left upper limb: R22.32

## 2018-11-12 SURGERY — EXCISION MASS
Anesthesia: Regional | Site: Hand | Laterality: Left

## 2018-11-12 MED ORDER — LACTATED RINGERS IV SOLN
INTRAVENOUS | Status: DC
  Administered 2018-11-12: 10:00:00 via INTRAVENOUS

## 2018-11-12 MED ORDER — OXYCODONE HCL 5 MG/5ML PO SOLN: 5.0000 mg | Freq: Once | ORAL | Status: DC | PRN

## 2018-11-12 MED ORDER — CEFAZOLIN SODIUM 1 G IJ SOLR
INTRAMUSCULAR | Status: AC
  Filled 2018-11-12: qty 20

## 2018-11-12 MED ORDER — LIDOCAINE HCL (PF) 0.5 % IJ SOLN
INTRAMUSCULAR | Status: DC | PRN
  Administered 2018-11-12: 30 mL via INTRAVENOUS

## 2018-11-12 MED ORDER — FENTANYL CITRATE (PF) 100 MCG/2ML IJ SOLN: 50.0000 ug | INTRAMUSCULAR | Status: DC | PRN

## 2018-11-12 MED ORDER — OXYCODONE HCL 5 MG PO TABS: 5.0000 mg | ORAL_TABLET | Freq: Once | ORAL | Status: DC | PRN

## 2018-11-12 MED ORDER — CEFAZOLIN SODIUM-DEXTROSE 2-4 GM/100ML-% IV SOLN
INTRAVENOUS | Status: AC
  Filled 2018-11-12: qty 100

## 2018-11-12 MED ORDER — MIDAZOLAM HCL 2 MG/2ML IJ SOLN
INTRAMUSCULAR | Status: DC | PRN
  Administered 2018-11-12: 2 mg via INTRAVENOUS

## 2018-11-12 MED ORDER — LIDOCAINE HCL (PF) 0.5 % IJ SOLN
INTRAMUSCULAR | Status: AC
  Filled 2018-11-12: qty 100

## 2018-11-12 MED ORDER — FENTANYL CITRATE (PF) 100 MCG/2ML IJ SOLN
INTRAMUSCULAR | Status: DC | PRN
  Administered 2018-11-12 (×2): 50 ug via INTRAVENOUS

## 2018-11-12 MED ORDER — PROPOFOL 500 MG/50ML IV EMUL
INTRAVENOUS | Status: DC | PRN
  Administered 2018-11-12: 75 ug/kg/min via INTRAVENOUS

## 2018-11-12 MED ORDER — FENTANYL CITRATE (PF) 100 MCG/2ML IJ SOLN
INTRAMUSCULAR | Status: AC
  Filled 2018-11-12: qty 2

## 2018-11-12 MED ORDER — ONDANSETRON HCL 4 MG/2ML IJ SOLN: 4.0000 mg | Freq: Once | INTRAMUSCULAR | Status: DC | PRN

## 2018-11-12 MED ORDER — BUPIVACAINE HCL (PF) 0.25 % IJ SOLN
INTRAMUSCULAR | Status: DC | PRN
  Administered 2018-11-12: 8 mL

## 2018-11-12 MED ORDER — MIDAZOLAM HCL 2 MG/2ML IJ SOLN
INTRAMUSCULAR | Status: AC
  Filled 2018-11-12: qty 2

## 2018-11-12 MED ORDER — LIDOCAINE HCL (CARDIAC) PF 100 MG/5ML IV SOSY
PREFILLED_SYRINGE | INTRAVENOUS | Status: DC | PRN
  Administered 2018-11-12: 40 mg via INTRAVENOUS

## 2018-11-12 MED ORDER — FENTANYL CITRATE (PF) 100 MCG/2ML IJ SOLN: 25.0000 ug | INTRAMUSCULAR | Status: DC | PRN

## 2018-11-12 MED ORDER — PROPOFOL 10 MG/ML IV BOLUS
INTRAVENOUS | Status: DC | PRN
  Administered 2018-11-12: 20 mg via INTRAVENOUS

## 2018-11-12 MED ORDER — CEFAZOLIN SODIUM-DEXTROSE 2-4 GM/100ML-% IV SOLN
2.0000 g | INTRAVENOUS | Status: AC
  Administered 2018-11-12: 2 g via INTRAVENOUS

## 2018-11-12 MED ORDER — TRAMADOL HCL 50 MG PO TABS: 50.0000 mg | ORAL_TABLET | Freq: Four times a day (QID) | ORAL | 0 refills | Status: DC | PRN

## 2018-11-12 MED ORDER — MIDAZOLAM HCL 2 MG/2ML IJ SOLN: 1.0000 mg | INTRAMUSCULAR | Status: DC | PRN

## 2018-11-12 MED ORDER — CHLORHEXIDINE GLUCONATE 4 % EX LIQD: 60.0000 mL | Freq: Once | CUTANEOUS | Status: DC

## 2018-11-12 MED ORDER — SCOPOLAMINE 1 MG/3DAYS TD PT72: 1.0000 | MEDICATED_PATCH | Freq: Once | TRANSDERMAL | Status: DC | PRN

## 2018-11-12 SURGICAL SUPPLY — 45 items
BLADE SURG 15 STRL LF DISP TIS (BLADE) ×1 IMPLANT
BLADE SURG 15 STRL SS (BLADE) ×2
BNDG COHESIVE 1X5 TAN STRL LF (GAUZE/BANDAGES/DRESSINGS) IMPLANT
BNDG COHESIVE 2X5 TAN STRL LF (GAUZE/BANDAGES/DRESSINGS) ×3 IMPLANT
BNDG COHESIVE 3X5 TAN STRL LF (GAUZE/BANDAGES/DRESSINGS) IMPLANT
BNDG ESMARK 4X9 LF (GAUZE/BANDAGES/DRESSINGS) ×3 IMPLANT
BNDG GAUZE ELAST 4 BULKY (GAUZE/BANDAGES/DRESSINGS) IMPLANT
CHLORAPREP W/TINT 26 (MISCELLANEOUS) ×3 IMPLANT
CORD BIPOLAR FORCEPS 12FT (ELECTRODE) ×3 IMPLANT
COVER BACK TABLE REUSABLE LG (DRAPES) ×3 IMPLANT
COVER MAYO STAND REUSABLE (DRAPES) ×3 IMPLANT
COVER WAND RF STERILE (DRAPES) IMPLANT
CUFF TOURN SGL QUICK 18X4 (TOURNIQUET CUFF) ×3 IMPLANT
DECANTER SPIKE VIAL GLASS SM (MISCELLANEOUS) IMPLANT
DRAIN PENROSE 1/2X12 LTX STRL (WOUND CARE) IMPLANT
DRAPE EXTREMITY T 121X128X90 (DISPOSABLE) ×3 IMPLANT
DRAPE SURG 17X23 STRL (DRAPES) ×3 IMPLANT
GAUZE SPONGE 4X4 12PLY STRL (GAUZE/BANDAGES/DRESSINGS) ×3 IMPLANT
GAUZE XEROFORM 1X8 LF (GAUZE/BANDAGES/DRESSINGS) ×3 IMPLANT
GLOVE BIOGEL PI IND STRL 6.5 (GLOVE) ×1 IMPLANT
GLOVE BIOGEL PI IND STRL 8.5 (GLOVE) ×1 IMPLANT
GLOVE BIOGEL PI INDICATOR 6.5 (GLOVE) ×2
GLOVE BIOGEL PI INDICATOR 8.5 (GLOVE) ×2
GLOVE ECLIPSE 6.5 STRL STRAW (GLOVE) ×3 IMPLANT
GLOVE EXAM NITRILE MD LF STRL (GLOVE) ×3 IMPLANT
GLOVE SURG ORTHO 8.0 STRL STRW (GLOVE) ×3 IMPLANT
GOWN STRL REUS W/ TWL LRG LVL3 (GOWN DISPOSABLE) ×1 IMPLANT
GOWN STRL REUS W/TWL LRG LVL3 (GOWN DISPOSABLE) ×2
GOWN STRL REUS W/TWL XL LVL3 (GOWN DISPOSABLE) ×3 IMPLANT
NDL SAFETY ECLIPSE 18X1.5 (NEEDLE) IMPLANT
NEEDLE HYPO 18GX1.5 SHARP (NEEDLE)
NEEDLE PRECISIONGLIDE 27X1.5 (NEEDLE) ×3 IMPLANT
NS IRRIG 1000ML POUR BTL (IV SOLUTION) ×3 IMPLANT
PACK BASIN DAY SURGERY FS (CUSTOM PROCEDURE TRAY) ×3 IMPLANT
PAD CAST 3X4 CTTN HI CHSV (CAST SUPPLIES) IMPLANT
PADDING CAST COTTON 3X4 STRL (CAST SUPPLIES)
SPLINT PLASTER CAST XFAST 3X15 (CAST SUPPLIES) IMPLANT
SPLINT PLASTER XTRA FASTSET 3X (CAST SUPPLIES)
STOCKINETTE 4X48 STRL (DRAPES) ×3 IMPLANT
SUT ETHILON 4 0 PS 2 18 (SUTURE) ×3 IMPLANT
SUT VIC AB 4-0 P2 18 (SUTURE) IMPLANT
SYR BULB 3OZ (MISCELLANEOUS) ×3 IMPLANT
SYR CONTROL 10ML LL (SYRINGE) ×3 IMPLANT
TOWEL GREEN STERILE FF (TOWEL DISPOSABLE) ×3 IMPLANT
UNDERPAD 30X30 (UNDERPADS AND DIAPERS) ×3 IMPLANT

## 2018-11-12 NOTE — Anesthesia Preprocedure Evaluation (Addendum)
Anesthesia Evaluation  Patient identified by MRN, date of birth, ID band Patient awake    Reviewed: Allergy & Precautions, NPO status , Patient's Chart, lab work & pertinent test results  History of Anesthesia Complications Negative for: history of anesthetic complications  Airway Mallampati: I  TM Distance: >3 FB Neck ROM: Full    Dental  (+) Upper Dentures   Pulmonary Current Smoker,    Pulmonary exam normal        Cardiovascular negative cardio ROS Normal cardiovascular exam     Neuro/Psych negative neurological ROS  negative psych ROS   GI/Hepatic negative GI ROS, Neg liver ROS,   Endo/Other  negative endocrine ROS  Renal/GU negative Renal ROS  negative genitourinary   Musculoskeletal negative musculoskeletal ROS (+)   Abdominal   Peds  Hematology negative hematology ROS (+)   Anesthesia Other Findings Recent workup for retinal artery occlusion- currently with cardiac event monitor in place. Echo was unremarkable. Carotid doppler showed mild stenosis (<50% on the left).  Reproductive/Obstetrics                            Anesthesia Physical Anesthesia Plan  ASA: II  Anesthesia Plan: Bier Block and Bier Block-LIDOCAINE ONLY   Post-op Pain Management:    Induction: Intravenous  PONV Risk Score and Plan: 0 and Propofol infusion and Treatment may vary due to age or medical condition  Airway Management Planned: Natural Airway and Simple Face Mask  Additional Equipment: None  Intra-op Plan:   Post-operative Plan:   Informed Consent: I have reviewed the patients History and Physical, chart, labs and discussed the procedure including the risks, benefits and alternatives for the proposed anesthesia with the patient or authorized representative who has indicated his/her understanding and acceptance.       Plan Discussed with:   Anesthesia Plan Comments:         Anesthesia Quick Evaluation

## 2018-11-12 NOTE — Transfer of Care (Signed)
Immediate Anesthesia Transfer of Care Note  Patient: Brian Benton  Procedure(s) Performed: EXCISION MASS LEFT HAND (Left Hand)  Patient Location: PACU  Anesthesia Type:MAC and Bier block  Level of Consciousness: awake, alert , oriented and patient cooperative  Airway & Oxygen Therapy: Patient Spontanous Breathing and Patient connected to nasal cannula oxygen  Post-op Assessment: Report given to RN and Post -op Vital signs reviewed and stable  Post vital signs: Reviewed and stable  Last Vitals:  Vitals Value Taken Time  BP 104/66 11/12/2018 10:38 AM  Temp    Pulse 51 11/12/2018 10:40 AM  Resp 18 11/12/2018 10:40 AM  SpO2 100 % 11/12/2018 10:40 AM  Vitals shown include unvalidated device data.  Last Pain:  Vitals:   11/12/18 0936  TempSrc: Oral  PainSc: 0-No pain         Complications: No apparent anesthesia complications

## 2018-11-12 NOTE — Discharge Instructions (Addendum)

## 2018-11-12 NOTE — Op Note (Signed)
NAME: Brian Benton MEDICAL RECORD NO: [id number] DATE OF BIRTH: 1956-05-07 FACILITY: [facility] LOCATION: [facility] PHYSICIAN: Wynonia Sours, MD   OPERATIVE REPORT   DATE OF PROCEDURE: 11/12/18    PREOPERATIVE DIAGNOSIS:   Mass left hand   POSTOPERATIVE DIAGNOSIS:   Same   PROCEDURE:   Excision mass left hand   SURGEON: Daryll Brod, M.D.   ASSISTANT: none   ANESTHESIA:  Bier block with sedation and Local   INTRAVENOUS FLUIDS:  Per anesthesia flow sheet.   ESTIMATED BLOOD LOSS:  Minimal.   COMPLICATIONS:  None.   SPECIMENS:   Mass   TOURNIQUET TIME:   * Missing tourniquet times found for documented tourniquets in log: [id number] *   DISPOSITION:  Stable to PACU.   INDICATIONS: Patient is a 63 year old male with a history of a mass on the ulnar aspect of his metacarpal small finger left hand.  This been gradually increasing in size.  He is not complaining of any significant pain or discomfort with it.  He has elected to have this surgically removed.  Pre-peri-and postoperative course been discussed along with risks and complications.  He is aware that there is no guarantee to the surgery the possibility of infection recurrence injury to arteries nerves tendons complete relief symptoms and dystrophy.  In the preoperative area the patient is seen extremity marked by both patient and surgeon antibiotic given  OPERATIVE COURSE: Patient is brought to the operating room where form based IV regional anesthetic was carried out without difficulty under the direction of the anesthesia department.  Was prepped using ChloraPrep in a supine position with a left arm free.  A three-minute dry time was allowed and a timeout taken to confirm patient procedure.  A longitudinal incision was made directly over the mass carried down through subcutaneous tissue.  A para white mass was immediately encountered.  Neurovascular structures were identified and protected with blunt dissection this  was dissected free and sent to pathology.  The mass measured 12 x 7  mm in diameter.  On opening it extruded a toothpaste-like material.  The wound was closed copious irrigated with saline.  The skin was closed and opted for nylon sutures.  Local infiltration quarter percent bupivacaine without epinephrine was given.  A sterile compressive dressing with the fingers free was applied.  Deflation of the tourniquet all fingers immediately pink.  He was taken to the recovery room for observation in satisfactory condition.  He will be discharged home trying to return to the hand center [facility] in 1 week Tylenol ibuprofen for pain with Ultram as a backup.   Daryll Brod, MD Electronically signed, 11/12/18

## 2018-11-12 NOTE — Brief Op Note (Signed)
11/12/2018  10:34 AM  PATIENT:  Caren Macadam  63 y.o. male  PRE-OPERATIVE DIAGNOSIS:  MASS LEFT HAND  POST-OPERATIVE DIAGNOSIS:  Cyst  PROCEDURE:  Procedure(s) with comments: EXCISION MASS LEFT HAND (Left) - FAB  SURGEON:  Surgeon(s) and Role:    Daryll Brod, MD - Primary  PHYSICIAN ASSISTANT:   ASSISTANTS: none   ANESTHESIA:   local, regional and IV sedation  EBL:  0 mL   BLOOD ADMINISTERED:none  DRAINS: none   LOCAL MEDICATIONS USED:  BUPIVICAINE   SPECIMEN:  Excision  DISPOSITION OF SPECIMEN:  PATHOLOGY  COUNTS:  YES  TOURNIQUET:  * Missing tourniquet times found for documented tourniquets in log: [id number] *  DICTATION: .Viviann Spare Dictation  PLAN OF CARE: Discharge to home after PACU  PATIENT DISPOSITION:  PACU - hemodynamically stable.

## 2018-11-12 NOTE — Anesthesia Postprocedure Evaluation (Signed)
Anesthesia Post Note  Patient: Brian Benton  Procedure(s) Performed: EXCISION MASS LEFT HAND (Left Hand)     Patient location during evaluation: PACU Anesthesia Type: Bier Block Level of consciousness: awake and alert Pain management: pain level controlled Vital Signs Assessment: post-procedure vital signs reviewed and stable Respiratory status: spontaneous breathing, nonlabored ventilation and respiratory function stable Cardiovascular status: blood pressure returned to baseline and stable Postop Assessment: no apparent nausea or vomiting Anesthetic complications: no    Last Vitals:  Vitals:   11/12/18 1045 11/12/18 1100  BP: 103/66 120/76  Pulse: (!) 51 (!) 47  Resp: 20 16  Temp:    SpO2: 100% 99%    Last Pain:  Vitals:   11/12/18 1100  TempSrc:   PainSc: 0-No pain                 Lidia Collum

## 2018-11-12 NOTE — H&P (Signed)
Brian Benton is an 63 y.o. male.   Chief Complaint: mass left hand HPI: Brian Benton is a 63 year old left-hand-dominant male referred by Dr. Renda Rolls for consultation regarding a mass on his left hand. This on the dorsal ulnar aspect of his fifth metacarpal shaft. States it is been present for approximately 1 year. He recalls no history of injury. Is not causing any pain or discomfort. He feels that it is enlarged. Nothing makes it better or worse. Is not had any treatment for this. Is no history of diabetes thyroid problems arthritis or gout. Family history is negative for to these also.    Past Medical History:  Diagnosis Date  . History of bladder cancer    s/p  TURBT 07-25-2014  . Hyperlipidemia   . Mass of left hand     Past Surgical History:  Procedure Laterality Date  . BLADDER SURGERY     [place]  . CYSTECTOMY    . CYSTOSCOPY WITH BIOPSY N/A 07/25/2014   Procedure: CYSTOSCOPY WITH BLADDER BIOPSY AND FULGERATION;  Surgeon: [name], MD;  Location: [facility];  Service: Urology;  Laterality: N/A;  . CYSTOSCOPY WITH BIOPSY N/A 11/28/2014   Procedure: CYSTOSCOPY WITH  BLADDER BIOPSY;  Surgeon: [name], MD;  Location: [facility];  Service: Urology;  Laterality: N/A;  . HEMORRHOID SURGERY  03/15/2012   Procedure: HEMORRHOIDECTOMY;  Surgeon: [name], MD;  Location: [facility];  Service: General;  Laterality: N/A;  . HERNIA REPAIR    . INGUINAL HERNIA REPAIR Bilateral 2006  . TESTICLE SURGERY  2004   Ruptured Undescended Right testicle   . TRANSURETHRAL RESECTION OF BLADDER TUMOR WITH GYRUS (TURBT-GYRUS) N/A 05/23/2014   Procedure: TRANSURETHRAL RESECTION OF BLADDER TUMOR WITH GYRUS (TURBT-GYRUS);  Surgeon: [name], MD;  Location: [facility];  Service: Urology;  Laterality: N/A;    Family History  Problem Relation Age of Onset  . Cancer Mother        leukemia  . Diabetes Brother    Social History:  reports that he  has been smoking cigarettes. He has a 15.00 pack-year smoking history. He has never used smokeless tobacco. He reports previous alcohol use. He reports that he does not use drugs.  Allergies: No Known Allergies  No medications prior to admission.    No results found for this or any previous visit (from the past 48 hour(s)).  No results found.   Pertinent items are noted in HPI.  Height 6' (1.829 m), weight 77.1 kg.  General appearance: alert, cooperative and appears stated age Head: Normocephalic, without obvious abnormality Neck: no JVD Resp: clear to auscultation bilaterally Cardio: regular rate and rhythm, S1, S2 normal, no murmur, click, rub or gallop GI: soft, non-tender; bowel sounds normal; no masses,  no organomegaly Extremities: mass left hand Pulses: 2+ and symmetric Skin: Skin color, texture, turgor normal. No rashes or lesions Neurologic: Grossly normal Incision/Wound: na  Assessment/Plan Assessment:  1. Mass of finger of left hand    Plan: We have discussed with him the the nature of the problem. Dr. Armanda Magic notes are reviewed. Would recommend surgical excision of this for him. Pre-peri-and postoperative course are discussed along with risks and complications. He is aware there is no guarantee to the surgery the possibility of infection recurrence injury to arteries nerves tendons complete week symptoms dystrophy. He is advised that this could possibly be a nerve tumor or is more likely may be an epidermal inclusion cyst.  He would like to proceed in this and schedule as an outpatient for excision mass left hand as an outpatient under regional anesthesia.    Daryll Brod 11/12/2018, 5:18 AM

## 2018-11-13 ENCOUNTER — Encounter (HOSPITAL_BASED_OUTPATIENT_CLINIC_OR_DEPARTMENT_OTHER): Payer: Self-pay | Admitting: Orthopedic Surgery

## 2018-12-31 ENCOUNTER — Ambulatory Visit (INDEPENDENT_AMBULATORY_CARE_PROVIDER_SITE_OTHER): Payer: 59 | Admitting: Cardiology

## 2018-12-31 ENCOUNTER — Other Ambulatory Visit: Payer: Self-pay

## 2018-12-31 ENCOUNTER — Encounter: Payer: Self-pay | Admitting: Cardiology

## 2018-12-31 VITALS — BP 118/74 | HR 75

## 2018-12-31 DIAGNOSIS — E782 Mixed hyperlipidemia: Secondary | ICD-10-CM

## 2018-12-31 DIAGNOSIS — H34232 Retinal artery branch occlusion, left eye: Secondary | ICD-10-CM | POA: Diagnosis not present

## 2018-12-31 DIAGNOSIS — I10 Essential (primary) hypertension: Secondary | ICD-10-CM

## 2018-12-31 MED ORDER — ROSUVASTATIN CALCIUM 40 MG PO TABS: 40.0000 mg | ORAL_TABLET | Freq: Every day | ORAL | 3 refills | Status: DC

## 2018-12-31 NOTE — Progress Notes (Signed)
Subjective:   Brian Benton, male    DOB: [birth year], 63 y.o.   MRN: [id number]  I connected withthe patient on 07/20/20by a video enabled telemedicine application and verified that I am speaking with the correct person using two identifiers.    I discussed the limitations of evaluation and management by telemedicine and the availability of in person appointments. The patient expressed understanding and agreed to proceed.   This visit type was conducted due to national recommendations for restrictions regarding the COVID-19 Pandemic (e.g. social distancing). This format is felt to be most appropriate for this patient at this time. All issues noted in this document were discussed and addressed. No physical exam was performed (except for noted visual exam findings with Tele health visits). The patient has consented to conduct a Tele health visit and understands insurance will be billed.   Chief complaint:  Branch retinal artery occlusion   HPI  63 y.o. Caucasian male referred for cardiac risk factor modification in light of branch retinal artery occlusion.   Workup showed only mild heterogenous plaque in b/l carotid arteries, <50% stenosis in left external carotid artery.  Echocardiogram showed structurally normal heart. Event monitor did not show any arrhthymias.  Patient has not had any recurrence of his symptoms. In fact, his vision has returned to its baseline normal, according to the patient. He has an appt with the retina specialist tomorrow.   On a positive note, he has quit smoking.   Past Medical History:  Diagnosis Date  . History of bladder cancer    s/p  TURBT 07-25-2014  . Hyperlipidemia   . Mass of left hand      Past Surgical History:  Procedure Laterality Date  . BLADDER SURGERY     [place]  . CYSTECTOMY    . CYSTOSCOPY WITH BIOPSY N/A 07/25/2014   Procedure: CYSTOSCOPY WITH BLADDER BIOPSY AND FULGERATION;  Surgeon: [name], MD;  Location: [facility];  Service: Urology;  Laterality: N/A;  . CYSTOSCOPY WITH BIOPSY N/A 11/28/2014   Procedure: CYSTOSCOPY WITH  BLADDER BIOPSY;  Surgeon: [name], MD;  Location: [facility];  Service: Urology;  Laterality: N/A;  . HEMORRHOID SURGERY  03/15/2012   Procedure: HEMORRHOIDECTOMY;  Surgeon: [name], MD;  Location: [facility];  Service: General;  Laterality: N/A;  . HERNIA REPAIR    . INGUINAL HERNIA REPAIR Bilateral 2006  . MASS EXCISION Left 11/12/2018   Procedure: EXCISION MASS LEFT HAND;  Surgeon: Daryll Brod, MD;  Location: [facility];  Service: Orthopedics;  Laterality: Left;  FAB  . TESTICLE SURGERY  2004   Ruptured Undescended Right testicle   . TRANSURETHRAL RESECTION OF BLADDER TUMOR WITH GYRUS (TURBT-GYRUS) N/A 05/23/2014   Procedure: TRANSURETHRAL RESECTION OF BLADDER TUMOR WITH GYRUS (TURBT-GYRUS);  Surgeon: [name], MD;  Location: [facility];  Service: Urology;  Laterality: N/A;     Social History   Socioeconomic History  . Marital status: Married    Spouse name: Not on file  . Number of children: Not on file  . Years of education: Not on file  . Highest education level: Not on file  Occupational History  . Not on file  Social Needs  . Financial resource strain: Not on file  . Food insecurity    Worry: Not on file    Inability: Not on file  . Transportation needs    Medical: Not on file  Non-medical: Not on file  Tobacco Use  . Smoking status: Current Every Day Smoker    Packs/day: 0.50    Years: 30.00    Pack years: 15.00    Types: Cigarettes  . Smokeless tobacco: Never Used  Substance and Sexual Activity  . Alcohol use: Not Currently    Comment: social  . Drug use: No  . Sexual activity: Not on file  Lifestyle  . Physical activity    Days per week: Not on file    Minutes per session: Not on file  . Stress: Not on file  Relationships  . Social Herbalist on phone: Not  on file    Gets together: Not on file    Attends religious service: Not on file    Active member of club or organization: Not on file    Attends meetings of clubs or organizations: Not on file    Relationship status: Not on file  . Intimate partner violence    Fear of current or ex partner: Not on file    Emotionally abused: Not on file    Physically abused: Not on file    Forced sexual activity: Not on file  Other Topics Concern  . Not on file  Social History Narrative  . Not on file     Family History  Problem Relation Age of Onset  . Cancer Mother        leukemia  . Diabetes Brother      Current Outpatient Medications on File Prior to Visit  Medication Sig Dispense Refill  . aspirin EC 81 MG tablet Take 1 tablet (81 mg total) by mouth daily. 90 tablet 3  . brimonidine (ALPHAGAN) 0.2 % ophthalmic solution Place 1 drop into the left eye 2 (two) times daily. 10 mL 11  . MULTIPLE MINERALS PO Take 1 tablet by mouth daily.    . rosuvastatin (CRESTOR) 40 MG tablet Take 0.5 tablets (20 mg total) by mouth daily. 90 tablet 3  . traMADol (ULTRAM) 50 MG tablet Take 1 tablet (50 mg total) by mouth every 6 (six) hours as needed. 20 tablet 0   No current facility-administered medications on file prior to visit.     Cardiovascular studies:  Event monitor 10/31/2018- 11/29/2018: Dominant thyrhm sinus. No arrhtymias noted.   Carotid artery duplex  10/31/2018: Mild stenosis in the left external carotid artery (<50%). There is mild heterogeneous plaque in bilateral carotid arteries. Antegrade right vertebral artery flow. Antegrade left vertebral artery flow. Follow up studies if clinically indicated.  Echocardiogram 10/31/2018: Normal LV systolic function with EF 65%. Left ventricle cavity is normal in size. Normal global wall motion. Normal diastolic filling pattern. Calculated EF 65%. No significant valvular abnormalities.  Normal right atrial pressure.   Recent labs:  03/05/2018: Glucose 77. BUN/Cr 9/0.73. eGFR normal. Na/K 139/4.3. Rest of the CMP normal. H/H 14/42. MCV 95. Platelets 219.   Chol 241, TG 153, HDL 29, LDL 181.  Review of Systems  Constitution: Negative for decreased appetite, malaise/fatigue, weight gain and weight loss.  HENT: Negative for congestion.   Eyes: Positive for vision loss in left eye (Partial). Negative for visual disturbance.  Cardiovascular: Negative for chest pain, dyspnea on exertion, leg swelling, palpitations and syncope.  Respiratory: Negative for shortness of breath.   Endocrine: Negative for cold intolerance.  Hematologic/Lymphatic: Does not bruise/bleed easily.  Skin: Negative for itching and rash.  Musculoskeletal: Negative for myalgias.  Gastrointestinal: Negative for abdominal pain, nausea and vomiting.  Genitourinary: Negative for dysuria.  Neurological: Negative for dizziness and weakness.  Psychiatric/Behavioral: The patient is not nervous/anxious.   All other systems reviewed and are negative.        There were no vitals filed for this visit. (Measured by the patient using a home BP monitor)   Observation/findings during video visit   Objective:    Physical Exam  Physical Exam  Constitutional: He is oriented to person, place, and time. He appears well-developed and well-nourished. No distress.  Pulmonary/Chest: Effort normal.  Neurological: He is alert and oriented to person, place, and time.  Psychiatric: He has a normal mood and affect.  Nursing note and vitals reviewed.          Assessment & Recommendations:   63 yo. Caucasian male with hyperlipidemia, tobacco abuse, retinal branch retinal artery occlusion.   Branch retinal artery occlusion: No etiology found on echocardiogram, carotid US, event monitor,  Continue aspirin 81 mg daily He has upcoming appt with retina specialist tomorrow. If concern remains for cardioembolic source, could consider implantable loop recorder  placement for long term monitoring for Afib.   Hyperlipidemia: Continue Crestor 40 mg daily. Upcoming lipid panel with PCP in August.  F/u in 02/2019.   Nigel Mormon, MD [facility] Pager: [phone] Office: [phone] If no answer Cell [phone]

## 2019-01-23 NOTE — Progress Notes (Signed)
[facility] Note  01/25/2019     CHIEF COMPLAINT Patient presents for Retina Follow Up   HISTORY OF PRESENT ILLNESS: Brian Benton is a 63 y.o. male who presents to the clinic today for:   HPI    Retina Follow Up    Patient presents with  Other.  In left eye.  This started weeks ago.  Severity is moderate.  Duration of weeks.  Since onset it is stable.  I, the attending physician,  performed the HPI with the patient and updated documentation appropriately.          Comments    Patient states some days vision OS is better than others.  Patient denies eye pain or discomfort and denies any new or worsening floaters or fol.  Patient states he wore a heart monitor and cardiology did not find any abnormalities or a-fib.       Last edited by Bernarda Caffey, MD on 01/25/2019 10:17 AM. (History)    pt states his left eye feels "heavier" than normal, pt states he has been checking his blood pressure every day and it stays the same, pt state he had a virtual visit with his cardiologist and he doubled up on his crestor and added a baby aspirin daily and it's lowered his bp to about 115/75, pt states vision in OS is better some days than others.   Referring physician: Camille Bal, PA-C [address] [place],  [place] [address]  HISTORICAL INFORMATION:   Selected notes from the MEDICAL RECORD NUMBER Referred by [facility] for concern of BRAO LEE: 04.15.20 () [BCVA: OD: OS:] Ocular Hx- PMH-bladder cancer    CURRENT MEDICATIONS: Current Outpatient Medications (Ophthalmic Drugs)  Medication Sig  . brimonidine (ALPHAGAN) 0.2 % ophthalmic solution Place 1 drop into the left eye 2 (two) times daily. (Patient not taking: Reported on 12/31/2018)   No current facility-administered medications for this visit.  (Ophthalmic Drugs)   Current Outpatient Medications (Other)  Medication Sig  . aspirin EC 81 MG tablet Take 1 tablet (81 mg total) by mouth daily.   . MULTIPLE MINERALS PO Take 1 tablet by mouth daily.  . rosuvastatin (CRESTOR) 40 MG tablet Take 1 tablet (40 mg total) by mouth daily.   No current facility-administered medications for this visit.  (Other)      REVIEW OF SYSTEMS: ROS    Positive for: Eyes   Negative for: Constitutional, Gastrointestinal, Neurological, Skin, Genitourinary, Musculoskeletal, HENT, Endocrine, Cardiovascular, Respiratory, Psychiatric, Allergic/Imm, Heme/Lymph   Last edited by Doneen Poisson on 01/25/2019  9:09 AM. (History)       ALLERGIES No Known Allergies  PAST MEDICAL HISTORY Past Medical History:  Diagnosis Date  . History of bladder cancer    s/p  TURBT 07-25-2014  . Hyperlipidemia   . Mass of left hand    Past Surgical History:  Procedure Laterality Date  . BLADDER SURGERY     [place]  . CYSTECTOMY    . CYSTOSCOPY WITH BIOPSY N/A 07/25/2014   Procedure: CYSTOSCOPY WITH BLADDER BIOPSY AND FULGERATION;  Surgeon: [name], MD;  Location: [facility];  Service: Urology;  Laterality: N/A;  . CYSTOSCOPY WITH BIOPSY N/A 11/28/2014   Procedure: CYSTOSCOPY WITH  BLADDER BIOPSY;  Surgeon: [name], MD;  Location: [facility];  Service: Urology;  Laterality: N/A;  . HEMORRHOID SURGERY  03/15/2012   Procedure: HEMORRHOIDECTOMY;  Surgeon: [name],  MD;  Location: [facility];  Service: General;  Laterality: N/A;  . HERNIA REPAIR    . INGUINAL HERNIA REPAIR Bilateral 2006  . MASS EXCISION Left 11/12/2018   Procedure: EXCISION MASS LEFT HAND;  Surgeon: Daryll Brod, MD;  Location: [place];  Service: Orthopedics;  Laterality: Left;  FAB  . TESTICLE SURGERY  2004   Ruptured Undescended Right testicle   . TRANSURETHRAL RESECTION OF BLADDER TUMOR WITH GYRUS (TURBT-GYRUS) N/A 05/23/2014   Procedure: TRANSURETHRAL RESECTION OF BLADDER TUMOR WITH GYRUS (TURBT-GYRUS);  Surgeon: [name], MD;  Location: [facility];  Service:  Urology;  Laterality: N/A;    FAMILY HISTORY Family History  Problem Relation Age of Onset  . Cancer Mother        leukemia  . Diabetes Brother     SOCIAL HISTORY Social History   Tobacco Use  . Smoking status: Current Every Day Smoker    Packs/day: 0.50    Years: 30.00    Pack years: 15.00    Types: Cigarettes  . Smokeless tobacco: Never Used  Substance Use Topics  . Alcohol use: Not Currently    Comment: social  . Drug use: No         OPHTHALMIC EXAM:  Base Eye Exam    Visual Acuity (Snellen - Linear)      Right Left   Dist cc 20/20 -2 20/30 -1   Dist ph cc  20/20 -2   Correction: Glasses       Tonometry (Tonopen, 9:08 AM)      Right Left   Pressure 13 11       Pupils      Dark Light Shape React APD   Right 3 2 Round Brisk 0   Left 3 2 Round Brisk 0       Visual Fields      Left Right     Full   Restrictions Partial outer inferior temporal deficiency        Extraocular Movement      Right Left    Full Full       Neuro/Psych    Oriented x3: Yes   Mood/Affect: Normal       Dilation    Both eyes: 1.0% Mydriacyl, 2.5% Phenylephrine @ 9:08 AM        Slit Lamp and Fundus Exam    Slit Lamp Exam      Right Left   Lids/Lashes Dermatochalasis - upper lid Dermatochalasis - upper lid   Conjunctiva/Sclera small conj cyst IT quad nasal and temporal Pinguecula   Cornea 1+ Punctate epithelial erosions, pigmented scar at 0800 mild arcus, 2-3+ inferior Punctate epithelial erosions   Anterior Chamber Deep and quiet Deep and quiet   Iris Round and dilated Round and dilated   Lens 2+ Nuclear sclerosis, 2+ Cortical cataract 2+ Nuclear sclerosis, 2+ Cortical cataract   Vitreous Vitreous syneresis, Posterior vitreous detachment, vitreous condensations Vitreous syneresis       Fundus Exam      Right Left   Disc Pink and Sharp, mid temporal Peripapillary atrophy mild temporal PPA, sharp rim, mild ST pallor   C/D Ratio 0.5 0.5   Macula Flat, Good foveal  reflex, mild Retinal pigment epithelial mottling, No heme or edema Flat, good foveal reflex, mild RPE mottling and clumping, no retinal whitening   Vessels Vascular attenuation, mild Tortuousity Stably re-Perfused, mild AV crossing changes, mild attenuation, tortuosity-- greatest superior temporal quadrant  Periphery Attached, no heme Attached, pigmented VR tuft at 0300--stable, no heme         Refraction    Wearing Rx      Sphere Cylinder Axis   Right -2.25 +0.25 030   Left -2.25 +0.25 065          IMAGING AND PROCEDURES  Imaging and Procedures for @TODAY @  OCT, Retina - OU - Both Eyes       Right Eye Quality was good. Central Foveal Thickness: 270. Progression has been stable. Findings include no SRF, normal foveal contour, no IRF, vitreomacular adhesion .   Left Eye Quality was good. Central Foveal Thickness: 263. Progression has improved. Findings include normal foveal contour, no IRF, no SRF, intraretinal hyper-reflective material, inner retinal atrophy, vitreomacular adhesion  (Interval improvement in inner retinal hyper-reflectivity; Mild interval progression of superior and temporal inner retinal atrophy).   Notes *Images captured and stored on drive  Diagnosis / Impression:  OD: NFP, no IRF/SRF OS: BRAO superior macula with interval improvement in inner retinal hyper-reflectivity; mild  progression of superior and temporal inner retinal atrophy  Clinical management:  See below  Abbreviations: NFP - Normal foveal profile. CME - cystoid macular edema. PED - pigment epithelial detachment. IRF - intraretinal fluid. SRF - subretinal fluid. EZ - ellipsoid zone. ERM - epiretinal membrane. ORA - outer retinal atrophy. ORT - outer retinal tubulation. SRHM - subretinal hyper-reflective material                 ASSESSMENT/PLAN:    ICD-10-CM   1. 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 Retinal edema  H35.81 OCT, Retina - OU - Both Eyes  3.  Essential hypertension  I10   4. Hypertensive retinopathy of both eyes  H35.033   5. Combined forms of age-related cataract of both eyes  H25.813   6. Visual field scotoma of left eye  H53.412     1,2. BRAO OS  - acute onset, Monday, 04.13.20, by pt history  - saw Dr. [place] on Wednesday, 04.15.20   - initial BCVA 20/40 with inferior visual field loss OS  - on exam had mild whitening of superior macula with sparing of the fovea  - OCT shows improvement in inner-retinal hyperreflective material and interval progression of superior and temporal inner retinal atrophy  - FA 4.17.2020 showed relatively good arterial perfusion, but delayed venous filling in ST quadrant  - HVF 24-2 (5.15.20) shows inferior altitudinal defect  - discussed findings and prognosis  - cardiovascular work up performed by cardiology -- echocardiogram and carotid dopplers on 5.20.20 -- wnl; 30 day event monitor placed 5.20.20 -- wnl  - recommend optimization of cardiovascular risk factors with PCP and/or cardiology  - cont brimonidine BID OS  - f/u 6 DFE, OCT, repeat VF  3,4. Hypertensive retinopathy OU  - discussed importance of tight BP control  - monitor  5. Mixed form age related cataract  - The symptoms of cataract, surgical options, and treatments and risks were discussed with patient.  - discussed diagnosis and progression  - not yet visually significant  - monitor for now   Ophthalmic Meds Ordered this visit:  No orders of the defined types were placed in this encounter.      Return in about 6 months (around 07/28/2019) for DFE, OCT, repeat VF.  There are no Patient Instructions on file for this visit.   Explained the diagnoses, plan, and follow up with the patient and  they expressed understanding.  Patient expressed understanding of the importance of proper follow up care.   This document serves as a record of services personally performed by Gardiner Sleeper, MD, PhD. It was created on their behalf  by Roselee Nova, COMT. The creation of this record is the provider's dictation and/or activities during the visit.  Electronically signed by: Roselee Nova, COMT 01/26/19 2:00 AM   Gardiner Sleeper, M.D., Ph.D. Diseases & Surgery of the Retina and Vitreous [facility]  I have reviewed the above documentation for accuracy and completeness, and I agree with the above. Gardiner Sleeper, M.D., Ph.D. 01/26/19 2:00 AM    Abbreviations: M myopia (nearsighted); A astigmatism; H hyperopia (farsighted); P presbyopia; Mrx spectacle prescription;  CTL contact lenses; OD right eye; OS left eye; OU both eyes  XT exotropia; ET esotropia; PEK punctate epithelial keratitis; PEE punctate epithelial erosions; DES dry eye syndrome; MGD meibomian gland dysfunction; ATs artificial tears; PFAT's preservative free artificial tears; [place] nuclear sclerotic cataract; PSC posterior subcapsular cataract; ERM epi-retinal membrane; PVD posterior vitreous detachment; RD retinal detachment; DM diabetes mellitus; DR diabetic retinopathy; NPDR non-proliferative diabetic retinopathy; PDR proliferative diabetic retinopathy; CSME clinically significant macular edema; DME diabetic macular edema; dbh dot blot hemorrhages; CWS cotton wool spot; POAG primary open angle glaucoma; C/D cup-to-disc ratio; HVF humphrey visual field; GVF goldmann visual field; OCT optical coherence tomography; IOP intraocular pressure; BRVO Branch retinal vein occlusion; CRVO central retinal vein occlusion; CRAO central retinal artery occlusion; BRAO branch retinal artery occlusion; RT retinal tear; SB scleral buckle; PPV pars plana vitrectomy; VH Vitreous hemorrhage; PRP panretinal laser photocoagulation; IVK intravitreal kenalog; VMT vitreomacular traction; MH Macular hole;  NVD neovascularization of the disc; NVE neovascularization elsewhere; AREDS age related eye disease study; ARMD age related macular degeneration; POAG primary open angle  glaucoma; EBMD epithelial/anterior basement membrane dystrophy; ACIOL anterior chamber intraocular lens; IOL intraocular lens; PCIOL posterior chamber intraocular lens; Phaco/IOL phacoemulsification with intraocular lens placement; [place] photorefractive keratectomy; LASIK laser assisted in situ keratomileusis; HTN hypertension; DM diabetes mellitus; COPD chronic obstructive pulmonary disease

## 2019-01-25 ENCOUNTER — Ambulatory Visit (INDEPENDENT_AMBULATORY_CARE_PROVIDER_SITE_OTHER): Payer: 59 | Admitting: Ophthalmology

## 2019-01-25 ENCOUNTER — Encounter (INDEPENDENT_AMBULATORY_CARE_PROVIDER_SITE_OTHER): Payer: Self-pay | Admitting: Ophthalmology

## 2019-01-25 ENCOUNTER — Other Ambulatory Visit: Payer: Self-pay

## 2019-01-25 DIAGNOSIS — H34232 Retinal artery branch occlusion, left eye: Secondary | ICD-10-CM

## 2019-01-25 DIAGNOSIS — H25813 Combined forms of age-related cataract, bilateral: Secondary | ICD-10-CM

## 2019-01-25 DIAGNOSIS — I10 Essential (primary) hypertension: Secondary | ICD-10-CM | POA: Diagnosis not present

## 2019-01-25 DIAGNOSIS — H53412 Scotoma involving central area, left eye: Secondary | ICD-10-CM

## 2019-01-25 DIAGNOSIS — H3581 Retinal edema: Secondary | ICD-10-CM

## 2019-01-25 DIAGNOSIS — H35033 Hypertensive retinopathy, bilateral: Secondary | ICD-10-CM | POA: Diagnosis not present

## 2019-03-04 ENCOUNTER — Other Ambulatory Visit: Payer: Self-pay

## 2019-03-04 ENCOUNTER — Encounter: Payer: 59 | Admitting: Cardiology

## 2019-03-04 ENCOUNTER — Encounter: Payer: Self-pay | Admitting: Cardiology

## 2019-03-04 ENCOUNTER — Telehealth: Payer: Self-pay | Admitting: Cardiology

## 2019-03-04 DIAGNOSIS — E782 Mixed hyperlipidemia: Secondary | ICD-10-CM

## 2019-03-04 NOTE — Telephone Encounter (Signed)
02/06/2019: Glucose 86. BUN/Cr 13/0.8. eGFR normal/. Na/K14/4.9. Rest of the CMP normal Chol 139, TG 80, HDL 31, LDL 92.   Discussed these lab results with the patient.  50% reduction in LDL with Crestor 40 mg daily.  HDL remains low.  Recommended increasing exercise and intake of not, red wine.  Patient needs to be careful about avoiding excess calorie intake while eating calorie dense foods such as nuts.  Office follow-up in January 2021.  Vernell Leep, MD

## 2019-03-04 NOTE — Progress Notes (Signed)
Subjective:   Brian Benton, male    DOB: 1955/10/05, 63 y.o.   MRN: [id number]  I connected withthe patient on 07/20/20by a video enabled telemedicine application and verified that I am speaking with the correct person using two identifiers.    I discussed the limitations of evaluation and management by telemedicine and the availability of in person appointments. The patient expressed understanding and agreed to proceed.   This visit type was conducted due to national recommendations for restrictions regarding the COVID-19 Pandemic (e.g. social distancing). This format is felt to be most appropriate for this patient at this time. All issues noted in this document were discussed and addressed. No physical exam was performed (except for noted visual exam findings with Tele health visits). The patient has consented to conduct a Tele health visit and understands insurance will be billed.   Chief complaint:  Branch retinal artery occlusion   HPI  63 y.o. Caucasian male referred for cardiac risk factor modification in light of branch retinal artery occlusion.   Workup showed only mild heterogenous plaque in b/l carotid arteries, <50% stenosis in left external carotid artery.  Echocardiogram showed structurally normal heart. Event monitor did not show any arrhthymias.  Patient has not had any recurrence of his symptoms. In fact, his vision has returned to its baseline normal, according to the patient. He has an appt with the retina specialist tomorrow.   On a positive note, he has quit smoking.   Past Medical History:  Diagnosis Date  . History of bladder cancer    s/p  TURBT 07-25-2014  . Hyperlipidemia   . Mass of left hand      Past Surgical History:  Procedure Laterality Date  . BLADDER SURGERY     [place]  . CYSTECTOMY    . CYSTOSCOPY WITH BIOPSY N/A 07/25/2014   Procedure: CYSTOSCOPY WITH BLADDER BIOPSY AND FULGERATION;  Surgeon: [name], MD;  Location: [facility];  Service: Urology;  Laterality: N/A;  . CYSTOSCOPY WITH BIOPSY N/A 11/28/2014   Procedure: CYSTOSCOPY WITH  BLADDER BIOPSY;  Surgeon: [name], MD;  Location: [facility];  Service: Urology;  Laterality: N/A;  . HEMORRHOID SURGERY  03/15/2012   Procedure: HEMORRHOIDECTOMY;  Surgeon: [name], MD;  Location: [facility];  Service: General;  Laterality: N/A;  . HERNIA REPAIR    . INGUINAL HERNIA REPAIR Bilateral 2006  . MASS EXCISION Left 11/12/2018   Procedure: EXCISION MASS LEFT HAND;  Surgeon: Daryll Brod, MD;  Location: [place];  Service: Orthopedics;  Laterality: Left;  FAB  . TESTICLE SURGERY  2004   Ruptured Undescended Right testicle   . TRANSURETHRAL RESECTION OF BLADDER TUMOR WITH GYRUS (TURBT-GYRUS) N/A 05/23/2014   Procedure: TRANSURETHRAL RESECTION OF BLADDER TUMOR WITH GYRUS (TURBT-GYRUS);  Surgeon: [name], MD;  Location: [facility];  Service: Urology;  Laterality: N/A;     Social History   Socioeconomic History  . Marital status: Married    Spouse name: Not on file  . Number of children: 2  . Years of education: Not on file  . Highest education level: Not on file  Occupational History  . Not on file  Social Needs  . Financial resource strain: Not on file  . Food insecurity    Worry: Not on file    Inability: Not on file  . Transportation needs    Medical: Not on file    Non-medical: Not  on file  Tobacco Use  . Smoking status: Former Smoker    Packs/day: 0.50    Years: 30.00    Pack years: 15.00    Types: Cigarettes    Quit date: 01/01/2019    Years since quitting: 0.1  . Smokeless tobacco: Never Used  Substance and Sexual Activity  . Alcohol use: Not Currently    Comment: social  . Drug use: No  . Sexual activity: Not on file  Lifestyle  . Physical activity    Days per week: Not on file    Minutes per session: Not on file  . Stress: Not on file  Relationships  . Social  Herbalist on phone: Not on file    Gets together: Not on file    Attends religious service: Not on file    Active member of club or organization: Not on file    Attends meetings of clubs or organizations: Not on file    Relationship status: Not on file  . Intimate partner violence    Fear of current or ex partner: Not on file    Emotionally abused: Not on file    Physically abused: Not on file    Forced sexual activity: Not on file  Other Topics Concern  . Not on file  Social History Narrative  . Not on file     Family History  Problem Relation Age of Onset  . Cancer Mother        leukemia  . Diabetes Brother      Current Outpatient Medications on File Prior to Visit  Medication Sig Dispense Refill  . aspirin EC 81 MG tablet Take 1 tablet (81 mg total) by mouth daily. 90 tablet 3  . brimonidine (ALPHAGAN) 0.2 % ophthalmic solution Place 1 drop into the left eye 2 (two) times daily. 10 mL 11  . CHANTIX STARTING MONTH PAK 0.5 MG X 11 & 1 MG X 42 tablet Take 1 tablet by mouth daily.    . MULTIPLE MINERALS PO Take 1 tablet by mouth daily.    . rosuvastatin (CRESTOR) 40 MG tablet Take 1 tablet (40 mg total) by mouth daily. 90 tablet 3   No current facility-administered medications on file prior to visit.     Cardiovascular studies:  Event monitor 10/31/2018- 11/29/2018: Dominant thyrhm sinus. No arrhtymias noted.   Carotid artery duplex  10/31/2018: Mild stenosis in the left external carotid artery (<50%). There is mild heterogeneous plaque in bilateral carotid arteries. Antegrade right vertebral artery flow. Antegrade left vertebral artery flow. Follow up studies if clinically indicated.  Echocardiogram 10/31/2018: Normal LV systolic function with EF 65%. Left ventricle cavity is normal in size. Normal global wall motion. Normal diastolic filling pattern. Calculated EF 65%. No significant valvular abnormalities.  Normal right atrial pressure.   Recent labs:  03/05/2018: Glucose 77. BUN/Cr 9/0.73. eGFR normal. Na/K 139/4.3. Rest of the CMP normal. H/H 14/42. MCV 95. Platelets 219.   Chol 241, TG 153, HDL 29, LDL 181.  Review of Systems  Constitution: Negative for decreased appetite, malaise/fatigue, weight gain and weight loss.  HENT: Negative for congestion.   Eyes: Positive for vision loss in left eye (Partial). Negative for visual disturbance.  Cardiovascular: Negative for chest pain, dyspnea on exertion, leg swelling, palpitations and syncope.  Respiratory: Negative for shortness of breath.   Endocrine: Negative for cold intolerance.  Hematologic/Lymphatic: Does not bruise/bleed easily.  Skin: Negative for itching and rash.  Musculoskeletal: Negative for myalgias.  Gastrointestinal: Negative for abdominal pain, nausea and vomiting.  Genitourinary: Negative for dysuria.  Neurological: Negative for dizziness and weakness.  Psychiatric/Behavioral: The patient is not nervous/anxious.   All other systems reviewed and are negative.        Vitals:   03/04/19 0923  BP: 120/74  Pulse: 74   (Measured by the patient using a home BP monitor)   Observation/findings during video visit   Objective:    Physical Exam  Physical Exam  Constitutional: He is oriented to person, place, and time. He appears well-developed and well-nourished. No distress.  Pulmonary/Chest: Effort normal.  Neurological: He is alert and oriented to person, place, and time.  Psychiatric: He has a normal mood and affect.  Nursing note and vitals reviewed.          Assessment & Recommendations:   63 yo. Caucasian male with hyperlipidemia, tobacco abuse, retinal branch retinal artery occlusion.   Branch retinal artery occlusion: No etiology found on echocardiogram, carotid US, event monitor,  Continue aspirin 81 mg daily He has upcoming appt with retina specialist tomorrow. If concern remains for cardioembolic source, could consider implantable loop  recorder placement for long term monitoring for Afib.   Hyperlipidemia: Continue Crestor 40 mg daily. Upcoming lipid panel with PCP in August.  F/u in 02/2019.   Nigel Mormon, MD [facility] [place] Pager: [phone] Office: [phone] If no answer Cell [phone]

## 2019-03-06 ENCOUNTER — Telehealth: Payer: 59 | Admitting: Cardiology

## 2019-06-17 ENCOUNTER — Telehealth: Payer: 59 | Admitting: Cardiology

## 2019-06-17 NOTE — Progress Notes (Signed)
Subjective:   Brian Benton, male    DOB: [birth year], 64 y.o.   MRN: [id number]  I connected withthe patient on 07/20/20by a video enabled telemedicine application and verified that I am speaking with the correct person using two identifiers.    I discussed the limitations of evaluation and management by telemedicine and the availability of in person appointments. The patient expressed understanding and agreed to proceed.   This visit type was conducted due to national recommendations for restrictions regarding the COVID-19 Pandemic (e.g. social distancing). This format is felt to be most appropriate for this patient at this time. All issues noted in this document were discussed and addressed. No physical exam was performed (except for noted visual exam findings with Tele health visits). The patient has consented to conduct a Tele health visit and understands insurance will be billed.   Chief complaint:  Branch retinal artery occlusion   HPI  64 y.o. Caucasian male referred for cardiac risk factor modification in light of branch retinal artery occlusion.   Workup showed only mild heterogenous plaque in b/l carotid arteries, <50% stenosis in left external carotid artery.  Echocardiogram showed structurally normal heart. Event monitor did not show any arrhthymias.  Patient has not had any recurrence of his symptoms. In fact, his vision has returned to its baseline normal, according to the patient. He has an appt with the retina specialist tomorrow.   On a positive note, he has quit smoking.   Past Medical History:  Diagnosis Date  . History of bladder cancer    s/p  TURBT 07-25-2014  . Hyperlipidemia   . Mass of left hand      Past Surgical History:  Procedure Laterality Date  . BLADDER SURGERY     [place]  . CYSTECTOMY    . CYSTOSCOPY WITH BIOPSY N/A 07/25/2014   Procedure: CYSTOSCOPY WITH BLADDER BIOPSY AND FULGERATION;  Surgeon: [name], MD;  Location: [facility];  Service: Urology;  Laterality: N/A;  . CYSTOSCOPY WITH BIOPSY N/A 11/28/2014   Procedure: CYSTOSCOPY WITH  BLADDER BIOPSY;  Surgeon: [name], MD;  Location: [facility];  Service: Urology;  Laterality: N/A;  . HEMORRHOID SURGERY  03/15/2012   Procedure: HEMORRHOIDECTOMY;  Surgeon: [name], MD;  Location: [facility];  Service: General;  Laterality: N/A;  . HERNIA REPAIR    . INGUINAL HERNIA REPAIR Bilateral 2006  . MASS EXCISION Left 11/12/2018   Procedure: EXCISION MASS LEFT HAND;  Surgeon: Daryll Brod, MD;  Location: [facility];  Service: Orthopedics;  Laterality: Left;  FAB  . TESTICLE SURGERY  2004   Ruptured Undescended Right testicle   . TRANSURETHRAL RESECTION OF BLADDER TUMOR WITH GYRUS (TURBT-GYRUS) N/A 05/23/2014   Procedure: TRANSURETHRAL RESECTION OF BLADDER TUMOR WITH GYRUS (TURBT-GYRUS);  Surgeon: [name], MD;  Location[facility];  Service: Urology;  Laterality: N/A;     Social History   Socioeconomic History  . Marital status: Married    Spouse name: Not on file  . Number of children: 2  . Years of education: Not on file  . Highest education level: Not on file  Occupational History  . Not on file  Tobacco Use  . Smoking status: Former Smoker    Packs/day: 0.50    Years: 30.00    Pack years: 15.00    Types: Cigarettes    Quit date: 01/01/2019    Years since quitting: 0.4  .  Smokeless tobacco: Never Used  Substance and Sexual Activity  . Alcohol use: Not Currently    Comment: social  . Drug use: No  . Sexual activity: Not on file  Other Topics Concern  . Not on file  Social History Narrative  . Not on file   Social Determinants of Health   Financial Resource Strain:   . Difficulty of Paying Living Expenses: Not on file  Food Insecurity:   . Worried About Charity fundraiser in the Last Year: Not on file  . Ran Out of Food in the Last Year: Not on file  Transportation  Needs:   . Lack of Transportation (Medical): Not on file  . Lack of Transportation (Non-Medical): Not on file  Physical Activity:   . Days of Exercise per Week: Not on file  . Minutes of Exercise per Session: Not on file  Stress:   . Feeling of Stress : Not on file  Social Connections:   . Frequency of Communication with Friends and Family: Not on file  . Frequency of Social Gatherings with Friends and Family: Not on file  . Attends Religious Services: Not on file  . Active Member of Clubs or Organizations: Not on file  . Attends Archivist Meetings: Not on file  . Marital Status: Not on file  Intimate Partner Violence:   . Fear of Current or Ex-Partner: Not on file  . Emotionally Abused: Not on file  . Physically Abused: Not on file  . Sexually Abused: Not on file     Family History  Problem Relation Age of Onset  . Cancer Mother        leukemia  . Diabetes Brother      Current Outpatient Medications on File Prior to Visit  Medication Sig Dispense Refill  . aspirin EC 81 MG tablet Take 1 tablet (81 mg total) by mouth daily. 90 tablet 3  . brimonidine (ALPHAGAN) 0.2 % ophthalmic solution Place 1 drop into the left eye 2 (two) times daily. 10 mL 11  . CHANTIX STARTING MONTH PAK 0.5 MG X 11 & 1 MG X 42 tablet Take 1 tablet by mouth daily.    . MULTIPLE MINERALS PO Take 1 tablet by mouth daily.    . rosuvastatin (CRESTOR) 40 MG tablet Take 1 tablet (40 mg total) by mouth daily. 90 tablet 3   No current facility-administered medications on file prior to visit.    Cardiovascular studies:  Event monitor 10/31/2018- 11/29/2018: Dominant thyrhm sinus. No arrhtymias noted.   Carotid artery duplex  10/31/2018: Mild stenosis in the left external carotid artery (<50%). There is mild heterogeneous plaque in bilateral carotid arteries. Antegrade right vertebral artery flow. Antegrade left vertebral artery flow. Follow up studies if clinically indicated.  Echocardiogram  10/31/2018: Normal LV systolic function with EF 65%. Left ventricle cavity is normal in size. Normal global wall motion. Normal diastolic filling pattern. Calculated EF 65%. No significant valvular abnormalities.  Normal right atrial pressure.   Recent labs: 02/06/2019: Glucose 86. BUN/Cr 13/0.8. eGFR normal/. Na/K14/4.9. Rest of the CMP normal Chol 139, TG 80, HDL 31, LDL 92.   03/05/2018: Glucose 77. BUN/Cr 9/0.73. eGFR normal. Na/K 139/4.3. Rest of the CMP normal. H/H 14/42. MCV 95. Platelets 219.   Chol 241, TG 153, HDL 29, LDL 181.  Review of Systems  Constitution: Negative for decreased appetite, malaise/fatigue, weight gain and weight loss.  HENT: Negative for congestion.   Eyes: Positive for vision loss in left  eye (Partial). Negative for visual disturbance.  Cardiovascular: Negative for chest pain, dyspnea on exertion, leg swelling, palpitations and syncope.  Respiratory: Negative for shortness of breath.   Endocrine: Negative for cold intolerance.  Hematologic/Lymphatic: Does not bruise/bleed easily.  Skin: Negative for itching and rash.  Musculoskeletal: Negative for myalgias.  Gastrointestinal: Negative for abdominal pain, nausea and vomiting.  Genitourinary: Negative for dysuria.  Neurological: Negative for dizziness and weakness.  Psychiatric/Behavioral: The patient is not nervous/anxious.   All other systems reviewed and are negative.        There were no vitals filed for this visit. (Measured by the patient using a home BP monitor)   Observation/findings during video visit   Objective:    Physical Exam  Physical Exam  Constitutional: He is oriented to person, place, and time. He appears well-developed and well-nourished. No distress.  Pulmonary/Chest: Effort normal.  Neurological: He is alert and oriented to person, place, and time.  Psychiatric: He has a normal mood and affect.  Nursing note and vitals reviewed.          Assessment &  Recommendations:   64 yo. Caucasian male with hyperlipidemia, tobacco abuse, retinal branch retinal artery occlusion.   Branch retinal artery occlusion: No etiology found on echocardiogram, carotid US, event monitor,  Continue aspirin 81 mg daily He has upcoming appt with retina specialist tomorrow. If concern remains for cardioembolic source, could consider implantable loop recorder placement for long term monitoring for Afib.   Hyperlipidemia: Continue Crestor 40 mg daily. Upcoming lipid panel with PCP in August.  F/u in 02/2019.   Nigel Mormon, MD [facility]. [facility] Pager: [phone] Office: [phone] If no answer Cell [phone]

## 2019-07-29 ENCOUNTER — Encounter (INDEPENDENT_AMBULATORY_CARE_PROVIDER_SITE_OTHER): Payer: 59 | Admitting: Ophthalmology

## 2019-07-30 NOTE — Progress Notes (Signed)
[facility] Note  07/31/2019     CHIEF COMPLAINT Patient presents for Retina Follow Up   HISTORY OF PRESENT ILLNESS: Brian Benton is a 64 y.o. male who presents to the clinic today for:   HPI    Retina Follow Up    Patient presents with  Other.  In left eye.  This started 9 months ago.  Since onset it is gradually improving.  I, the attending physician,  performed the HPI with the patient and updated documentation appropriately.          Comments    6 mos F/U BRAO OS. Patient states his vision has improved. Pt forgot his glasses today.       Last edited by Bernarda Caffey, MD on 08/01/2019  9:17 PM. (History)    Patient states vision has improved some OS. Denies floaters, FOL. Increased dosage of cholesterol medication from 10 mg to 20mg . No side effects since increasing cholesterol medication. Sometimes forgets brimonidine gtt. (instructions to use bid)  Referring physician: Aurea Graff, PA-C [address] [place],  [place] [address]  HISTORICAL INFORMATION:   Selected notes from the [place] Referred by [facility] for concern of BRAO LEE: 04.15.20 () [BCVA: OD: OS:] Ocular Hx- PMH-bladder cancer    CURRENT MEDICATIONS: Current Outpatient Medications (Ophthalmic Drugs)  Medication Sig  . brimonidine (ALPHAGAN) 0.2 % ophthalmic solution Place 1 drop into the left eye 2 (two) times daily.   No current facility-administered medications for this visit. (Ophthalmic Drugs)   Current Outpatient Medications (Other)  Medication Sig  . aspirin EC 81 MG tablet Take 1 tablet (81 mg total) by mouth daily.  . CHANTIX STARTING MONTH PAK 0.5 MG X 11 & 1 MG X 42 tablet Take 1 tablet by mouth daily.  . MULTIPLE MINERALS PO Take 1 tablet by mouth daily.  . rosuvastatin (CRESTOR) 40 MG tablet Take 1 tablet (40 mg total) by mouth daily.   No current facility-administered medications for this visit. (Other)      REVIEW OF  SYSTEMS: ROS    Negative for: Constitutional, Gastrointestinal, Neurological, Skin, Genitourinary, Musculoskeletal, HENT, Endocrine, Cardiovascular, Respiratory, Psychiatric, Allergic/Imm, Heme/Lymph   Last edited by Zenovia Jordan, LPN on 624THL  579FGE AM. (History)       ALLERGIES No Known Allergies  PAST MEDICAL HISTORY Past Medical History:  Diagnosis Date  . History of bladder cancer    s/p  TURBT 07-25-2014  . Hyperlipidemia   . Mass of left hand    Past Surgical History:  Procedure Laterality Date  . BLADDER SURGERY     [place]  . CYSTECTOMY    . CYSTOSCOPY WITH BIOPSY N/A 07/25/2014   Procedure: CYSTOSCOPY WITH BLADDER BIOPSY AND FULGERATION;  Surgeon: [name], MD;  Location: [facility];  Service: Urology;  Laterality: N/A;  . CYSTOSCOPY WITH BIOPSY N/A 11/28/2014   Procedure: CYSTOSCOPY WITH  BLADDER BIOPSY;  Surgeon: [name], MD;  Location: [facility];  Service: Urology;  Laterality: N/A;  . HEMORRHOID SURGERY  03/15/2012   Procedure: HEMORRHOIDECTOMY;  Surgeon: [name], MD;  Location: [facility];  Service: General;  Laterality: N/A;  . HERNIA REPAIR    . INGUINAL HERNIA REPAIR Bilateral 2006  . MASS EXCISION Left 11/12/2018   Procedure: EXCISION MASS LEFT HAND;  Surgeon: Daryll Brod, MD;  Location: [place];  Service: Orthopedics;  Laterality: Left;  FAB  .  TESTICLE SURGERY  2004   Ruptured Undescended Right testicle   . TRANSURETHRAL RESECTION OF BLADDER TUMOR WITH GYRUS (TURBT-GYRUS) N/A 05/23/2014   Procedure: TRANSURETHRAL RESECTION OF BLADDER TUMOR WITH GYRUS (TURBT-GYRUS);  Surgeon: [name], MD;  Location:  [facility];  Service: Urology;  Laterality: N/A;    FAMILY HISTORY Family History  Problem Relation Age of Onset  . Cancer Mother        leukemia  . Diabetes Brother     SOCIAL HISTORY Social History   Tobacco Use  . Smoking status: Former Smoker    Packs/day:  0.50    Years: 30.00    Pack years: 15.00    Types: Cigarettes    Quit date: 01/01/2019    Years since quitting: 0.5  . Smokeless tobacco: Never Used  Substance Use Topics  . Alcohol use: Not Currently    Comment: social  . Drug use: No         OPHTHALMIC EXAM:  Base Eye Exam    Visual Acuity (Snellen - Linear)      Right Left   Dist [place] 20/40 20/50   Dist ph [place] NI NI   Correction: Glasses  Forgot glasses       Tonometry (Tonopen, 9:07 AM)      Right Left   Pressure 10 10       Pupils      Dark Light Shape React APD   Right 3 2 Round Brisk None   Left 3 2 Round Brisk None       Visual Fields (Counting fingers)      Left Right    Full Full       Extraocular Movement      Right Left    Full, Ortho Full, Ortho       Neuro/Psych    Oriented x3: Yes   Mood/Affect: Normal       Dilation    Both eyes: 1.0% Mydriacyl, 2.5% Phenylephrine @ 9:26 AM        Slit Lamp and Fundus Exam    Slit Lamp Exam      Right Left   Lids/Lashes Dermatochalasis - upper lid Dermatochalasis - upper lid   Conjunctiva/Sclera small conj cyst IT quad nasal and temporal Pinguecula   Cornea trace Punctate epithelial erosions, pigmented scar at 0800 mild arcus, 1+ inferior Punctate epithelial erosions   Anterior Chamber Deep and quiet Deep and quiet   Iris Round and dilated Round and dilated   Lens 2+ Nuclear sclerosis, 2+ Cortical cataract 2+ Nuclear sclerosis, 2+ Cortical cataract   Vitreous Vitreous syneresis, Posterior vitreous detachment, vitreous condensations Vitreous syneresis       Fundus Exam      Right Left   Disc Pink and Sharp, mid temporal Peripapillary atrophy mild temporal PPA, sharp rim, mild ST pallor   C/D Ratio 0.5 0.5   Macula Flat, Good foveal reflex, mild Retinal pigment epithelial mottling, No heme or edema Flat, good foveal reflex, mild RPE mottling and clumping, no retinal whitening   Vessels Vascular attenuation, mild Tortuousity Stably re-Perfused,  mild AV crossing changes, mild attenuation, tortuosity-- greatest superior temporal quadrant     Periphery Attached, no heme Attached, pigmented VR tuft at 0300--stable, no heme           IMAGING AND PROCEDURES  Imaging and Procedures for @TODAY @  OCT, Retina - OU - Both Eyes       Right Eye Quality was good. Central Foveal  Thickness: 274. Progression has been stable. Findings include no SRF, normal foveal contour, no IRF, vitreomacular adhesion .   Left Eye Quality was good. Central Foveal Thickness: 258. Progression has been stable. Findings include normal foveal contour, no IRF, no SRF, intraretinal hyper-reflective material, inner retinal atrophy, vitreomacular adhesion  (stable improvement in inner retinal hyper-reflectivity; Mild interval progression of superior and temporal inner retinal atrophy).   Notes *Images captured and stored on drive  Diagnosis / Impression:  OD: NFP, no IRF/SRF OS: BRAO superior macula with stable improvement in inner retinal hyper-reflectivity; mild progression of superior and temporal inner retinal atrophy  Clinical management:  See below  Abbreviations: NFP - Normal foveal profile. CME - cystoid macular edema. PED - pigment epithelial detachment. IRF - intraretinal fluid. SRF - subretinal fluid. EZ - ellipsoid zone. ERM - epiretinal membrane. ORA - outer retinal atrophy. ORT - outer retinal tubulation. SRHM - subretinal hyper-reflective material        Humphrey Visual Field - OU - Both Eyes       Right Eye Reliability was borderline. Progression has been stable. Foveal threshold was normal. Findings include normal observations.   Left Eye Reliability was good. Progression has been stable. Foveal threshold was normal. Findings include inferior altitudinal defect (Inferior altitudinal defect improved).   Notes **Images stored on drive**  Impression: OD: normal study OS: inferior altitudinal defect corresponding to superior BRAO--no  significant change from prior                ASSESSMENT/PLAN:    ICD-10-CM   1. 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 Retinal edema  H35.81 OCT, Retina - OU - Both Eyes  3. Essential hypertension  I10   4. Hypertensive retinopathy of both eyes  H35.033   5. Combined forms of age-related cataract of both eyes  H25.813   6. Visual field scotoma of left eye  H53.412 Humphrey Visual Field - OU - Both Eyes    1,2. BRAO OS  - acute onset, Monday, 04.13.20, by pt history  - saw Dr. [place] on Wednesday, 04.15.20   - initial BCVA 20/40 with inferior visual field loss OS  - on exam had mild whitening of superior macula with sparing of the fovea  - FA 4.17.2020 showed relatively good arterial perfusion, but delayed venous filling in ST quadrant  - HVF 24-2 today (02.17.21) shows inferior altitudinal defect--no significant change from prior (05.15.20)  - OCT shows stable improvement in inner-retinal hyperreflective material and interval progression of superior and temporal inner retinal atrophy (stable from prior)  - discussed findings and prognosis  - cardiovascular work up performed by cardiology -- echocardiogram and carotid dopplers on 5.20.20 -- wnl; 30 day event monitor placed 5.20.20 -- wnl  - optimization of cardiovascular risk factors with PCP and/or cardiology  - IOP OK at 10 mmHg  - decrease brimonidine to once daily OS  - f/u 6-9 months sooner prn -- DFE, OCT  3,4. Hypertensive retinopathy OU  - discussed importance of tight BP control  - monitor  5. Mixed form age related cataract  - The symptoms of cataract, surgical options, and treatments and risks were discussed with patient.  - discussed diagnosis and progression  - not yet visually significant  - monitor for now   Ophthalmic Meds Ordered this visit:  No orders of the defined types were placed in this encounter.      Return 6-9  months, for DFE,  OCT.  There are no Patient Instructions on file for this visit.   Explained the diagnoses, plan, and follow up with the patient and they expressed understanding.  Patient expressed understanding of the importance of proper follow up care.   This document serves as a record of services personally performed by Gardiner Sleeper, MD, PhD. It was created on their behalf by Roselee Nova, COMT. The creation of this record is the provider's dictation and/or activities during the visit.  Electronically signed by: Roselee Nova, COMT 08/01/19 9:19 PM  Gardiner Sleeper, M.D., Ph.D. Diseases & Surgery of the Retina and [place] 07/31/2019   I have reviewed the above documentation for accuracy and completeness, and I agree with the above. Gardiner Sleeper, M.D., Ph.D. 08/01/19 9:19 PM   Abbreviations: M myopia (nearsighted); A astigmatism; H hyperopia (farsighted); P presbyopia; Mrx spectacle prescription;  CTL contact lenses; OD right eye; OS left eye; OU both eyes  XT exotropia; ET esotropia; PEK punctate epithelial keratitis; PEE punctate epithelial erosions; DES dry eye syndrome; MGD meibomian gland dysfunction; ATs artificial tears; PFAT's preservative free artificial tears; Lyman nuclear sclerotic cataract; PSC posterior subcapsular cataract; ERM epi-retinal membrane; PVD posterior vitreous detachment; RD retinal detachment; DM diabetes mellitus; DR diabetic retinopathy; NPDR non-proliferative diabetic retinopathy; PDR proliferative diabetic retinopathy; CSME clinically significant macular edema; DME diabetic macular edema; dbh dot blot hemorrhages; CWS cotton wool spot; POAG primary open angle glaucoma; C/D cup-to-disc ratio; HVF humphrey visual field; GVF goldmann visual field; OCT optical coherence tomography; IOP intraocular pressure; BRVO Branch retinal vein occlusion; CRVO central retinal vein occlusion; CRAO central retinal artery occlusion; BRAO branch retinal artery  occlusion; RT retinal tear; SB scleral buckle; PPV pars plana vitrectomy; VH Vitreous hemorrhage; PRP panretinal laser photocoagulation; IVK intravitreal kenalog; VMT vitreomacular traction; MH Macular hole;  NVD neovascularization of the disc; NVE neovascularization elsewhere; AREDS age related eye disease study; ARMD age related macular degeneration; POAG primary open angle glaucoma; EBMD epithelial/anterior basement membrane dystrophy; ACIOL anterior chamber intraocular lens; IOL intraocular lens; PCIOL posterior chamber intraocular lens; Phaco/IOL phacoemulsification with intraocular lens placement; Brinson photorefractive keratectomy; LASIK laser assisted in situ keratomileusis; HTN hypertension; DM diabetes mellitus; COPD chronic obstructive pulmonary disease

## 2019-07-31 ENCOUNTER — Encounter (INDEPENDENT_AMBULATORY_CARE_PROVIDER_SITE_OTHER): Payer: Self-pay | Admitting: Ophthalmology

## 2019-07-31 ENCOUNTER — Ambulatory Visit (INDEPENDENT_AMBULATORY_CARE_PROVIDER_SITE_OTHER): Payer: 59 | Admitting: Ophthalmology

## 2019-07-31 DIAGNOSIS — H34232 Retinal artery branch occlusion, left eye: Secondary | ICD-10-CM | POA: Diagnosis not present

## 2019-07-31 DIAGNOSIS — I10 Essential (primary) hypertension: Secondary | ICD-10-CM

## 2019-07-31 DIAGNOSIS — H25813 Combined forms of age-related cataract, bilateral: Secondary | ICD-10-CM

## 2019-07-31 DIAGNOSIS — H3581 Retinal edema: Secondary | ICD-10-CM | POA: Diagnosis not present

## 2019-07-31 DIAGNOSIS — H53412 Scotoma involving central area, left eye: Secondary | ICD-10-CM

## 2019-07-31 DIAGNOSIS — H35033 Hypertensive retinopathy, bilateral: Secondary | ICD-10-CM | POA: Diagnosis not present

## 2019-08-01 ENCOUNTER — Encounter (INDEPENDENT_AMBULATORY_CARE_PROVIDER_SITE_OTHER): Payer: Self-pay | Admitting: Ophthalmology

## 2019-10-03 ENCOUNTER — Other Ambulatory Visit: Payer: Self-pay | Admitting: Cardiology

## 2019-10-03 DIAGNOSIS — H34232 Retinal artery branch occlusion, left eye: Secondary | ICD-10-CM

## 2019-12-30 ENCOUNTER — Other Ambulatory Visit: Payer: Self-pay | Admitting: Cardiology

## 2019-12-30 DIAGNOSIS — E782 Mixed hyperlipidemia: Secondary | ICD-10-CM

## 2020-02-07 ENCOUNTER — Other Ambulatory Visit (HOSPITAL_COMMUNITY): Payer: Self-pay | Admitting: Family Medicine

## 2020-02-07 DIAGNOSIS — M545 Low back pain, unspecified: Secondary | ICD-10-CM

## 2020-02-07 DIAGNOSIS — M5416 Radiculopathy, lumbar region: Secondary | ICD-10-CM

## 2020-02-18 ENCOUNTER — Ambulatory Visit (HOSPITAL_COMMUNITY)
Admission: RE | Admit: 2020-02-18 | Discharge: 2020-02-18 | Disposition: A | Discharge status: Home / Self Care | Payer: 59 | Source: Ambulatory Visit | Attending: Family Medicine | Admitting: Family Medicine

## 2020-02-18 DIAGNOSIS — Z01812 Encounter for preprocedural laboratory examination: Secondary | ICD-10-CM | POA: Insufficient documentation

## 2020-02-18 DIAGNOSIS — Z20822 Contact with and (suspected) exposure to covid-19: Secondary | ICD-10-CM | POA: Diagnosis not present

## 2020-02-18 LAB — SARS CORONAVIRUS 2 (TAT 6-24 HRS): SARS Coronavirus 2: NEGATIVE

## 2020-02-19 ENCOUNTER — Encounter (HOSPITAL_COMMUNITY): Payer: Self-pay | Admitting: *Deleted

## 2020-02-19 NOTE — Progress Notes (Signed)
Patient denies shortness of breath, fever, cough or chest pain.  PCP - [facility] - Dr Vernell Leep  Chest x-ray - n/a EKG - n/a Stress Test - n/a ECHO - 10/31/18 Cardiac Cath - n/a  Aspirin Instructions: Follow your surgeon's instructions on when to stop aspirin prior to surgery,  If no instructions were given by your surgeon then you will need to call the office for those instructions.  STOP now taking any Aspirin (unless otherwise instructed by your surgeon), Aleve, Naproxen, Ibuprofen, Motrin, Advil, Goody's, BC's, all herbal medications, fish oil, and all vitamins.   Coronavirus Screening Covid test on 02/18/20 was negative.  Patient verbalized understanding of instructions that were given via phone.

## 2020-02-19 NOTE — Progress Notes (Signed)
Anesthesia Chart Review: SAME DAY WORK-UP   Case: [id number] Date/Time: 02/20/20 1000   Procedure: MRI LUMBAR W/O CONTRAST  WITH ANESTHESIA (N/A )   Anesthesia type: General   Pre-op diagnosis: LOW BACK PAIN   Location: [facility] OR RADIOLOGY ROOM / [place] [facility]   Surgeons: Radiologist, Medication, MD      DISCUSSION: Patient is a 64 year old male scheduled for the above procedure.  MRI was ordered by Rhina Brackett, MD. H&P completed on 02/11/20.   History includes former smoker (quit 01/01/19), bladder cancer (s/p TURBT 05/23/14, cystoscopy/bladder biopsy/fulguration 06/24/14), HLD, left hand epidermal inclusion cyst (s/p excision 11/12/18), branch retinal artery occlusion (left eye, ~ 09/2018).  He was evaluated by cardiologist Dr. Virgina Jock last on 06/17/19 for follow-up branch retinal artery occlusion. No etiology found on echo, carotid US, or event monitor. If retinal specialist had further concerns then could consider a loop recorder in the future.   Pre-procedure COVID-19 test was negative on 02/18/20. Anesthesia team to evaluate on the day of procedure.    VS: Ht 6' (1.829 m)   Wt 77.1 kg   BMI 23.06 kg/m   BP Readings from Last 3 Encounters:  03/04/19 120/74  12/31/18 118/74  11/12/18 123/73   Pulse Readings from Last 3 Encounters:  03/04/19 74  12/31/18 75  11/12/18 (!) 50    PROVIDERS: PCP is with [facility] [place], MD is cardiologist Bernarda Caffey, MD is retinal specialist [name], MD is urologist   LABS: For day of procedure as indicated.   EKG: 10/31/2018 Marked sinus  Bradycardia at 45 bpm BORDERLINE RHYTHM   CV: Event monitor 10/31/2018- 11/29/2018: Dominant thyrhm sinus. No arrhtymias noted.   Carotid artery duplex 10/31/2018: Mild stenosis in the left external carotid artery (<50%). There is mild heterogeneous plaque in bilateral carotid arteries. Antegrade right vertebral artery flow. Antegrade left vertebral artery  flow. Follow up studies if clinically indicated.  Echocardiogram 10/31/2018: Normal LV systolic function with EF 65%. Left ventricle cavity is normal in size. Normal global wall motion. Normal diastolic filling pattern. Calculated EF 65%. No significant valvular abnormalities.  Normal right atrial pressure.    Past Medical History:  Diagnosis Date  . Cancer ([place])    bladder  . History of bladder cancer    s/p  TURBT 07-25-2014  . Hyperlipidemia   . Mass of left hand     Past Surgical History:  Procedure Laterality Date  . BLADDER SURGERY     [place]  . COLONOSCOPY    . CYSTECTOMY    . CYSTOSCOPY WITH BIOPSY N/A 07/25/2014   Procedure: CYSTOSCOPY WITH BLADDER BIOPSY AND FULGERATION;  Surgeon: [name], MD;  [facility];  Service: Urology;  Laterality: N/A;  . CYSTOSCOPY WITH BIOPSY N/A 11/28/2014   Procedure: CYSTOSCOPY WITH  BLADDER BIOPSY;  Surgeon: [name], MD;  Location: [facility];  Service: Urology;  Laterality: N/A;  . HEMORRHOID SURGERY  03/15/2012   Procedure: HEMORRHOIDECTOMY;  Surgeon: [name], MD;  Location: [facility];  Service: General;  Laterality: N/A;  . HERNIA REPAIR    . INGUINAL HERNIA REPAIR Bilateral 2006  . MASS EXCISION Left 11/12/2018   Procedure: EXCISION MASS LEFT HAND;  Surgeon: Daryll Brod, MD;  Location: [facility];  Service: Orthopedics;  Laterality: Left;  FAB  . TESTICLE SURGERY  2004   Ruptured Undescended Right testicle   . TRANSURETHRAL RESECTION OF BLADDER TUMOR WITH GYRUS (TURBT-GYRUS) N/A  05/23/2014   Procedure: TRANSURETHRAL RESECTION OF BLADDER TUMOR WITH GYRUS (TURBT-GYRUS);  Surgeon: [name], MD;  Location: [facility];  Service: Urology;  Laterality: N/A;  . WISDOM TOOTH EXTRACTION      MEDICATIONS: No current facility-administered medications for this encounter.   . ASPIRIN ADULT LOW STRENGTH 81 MG EC tablet  . calcitonin, salmon,  (MIACALCIN/FORTICAL) 200 UNIT/ACT nasal spray  . Calcium Carbonate-Vit D-Min (CALTRATE 600+D PLUS MINERALS PO)  . Cholecalciferol (VITAMIN D) 50 MCG (2000 UT) tablet  . gabapentin (NEURONTIN) 600 MG tablet  . meloxicam (MOBIC) 15 MG tablet  . MULTIPLE MINERALS PO  . rosuvastatin (CRESTOR) 40 MG tablet  . brimonidine (ALPHAGAN) 0.2 % ophthalmic solution    Myra Gianotti, PA-C Surgical Short Stay/Anesthesiology [facility] Phone [phone] [facility] Phone [phone] 02/19/2020 3:03 PM

## 2020-02-19 NOTE — Anesthesia Preprocedure Evaluation (Addendum)
Anesthesia Evaluation  Patient identified by MRN, date of birth, ID band Patient awake    Reviewed: Allergy & Precautions, NPO status , Patient's Chart, lab work & pertinent test results  History of Anesthesia Complications Negative for: history of anesthetic complications  Airway Mallampati: I  TM Distance: >3 FB Neck ROM: Full    Dental  (+) Edentulous Upper, Upper Dentures, Dental Advisory Given   Pulmonary neg shortness of breath, neg recent URI, former smoker,  Covid-19 Nucleic Acid Test Results Lab Results      Component                Value               Date                      [place]              NEGATIVE            02/18/2020                [place]              NOT DETECTED        11/08/2018              breath sounds clear to auscultation       Cardiovascular hypertension, (-) angina(-) Past MI and (-) CHF (-) dysrhythmias  Rhythm:Regular     Neuro/Psych negative neurological ROS  negative psych ROS   GI/Hepatic negative GI ROS, Neg liver ROS,   Endo/Other  negative endocrine ROS  Renal/GU negative Renal ROSLab Results      Component                Value               Date                      CREATININE               0.82                03/15/2012           Lab Results      Component                Value               Date                      K                        4.0                 03/15/2012                Musculoskeletal negative musculoskeletal ROS (+)   Abdominal   Peds  Hematology negative hematology ROS (+) Lab Results      Component                Value               Date                      WBC                      11.2 (  H)            03/15/2012                HGB                      12.9 (L)            11/28/2014                HCT                      42.7                03/15/2012                MCV                      92.4                03/15/2012                PLT                       245                 03/15/2012              Anesthesia Other Findings   Reproductive/Obstetrics                            Anesthesia Physical Anesthesia Plan  ASA: II  Anesthesia Plan: General   Post-op Pain Management:    Induction: Intravenous  PONV Risk Score and Plan: 2 and Ondansetron, Treatment may vary due to age or medical condition and Dexamethasone  Airway Management Planned: LMA  Additional Equipment: None  Intra-op Plan:   Post-operative Plan: Extubation in OR  Informed Consent: I have reviewed the patients History and Physical, chart, labs and discussed the procedure including the risks, benefits and alternatives for the proposed anesthesia with the patient or authorized representative who has indicated his/her understanding and acceptance.     Dental advisory given  Plan Discussed with: CRNA and Surgeon  Anesthesia Plan Comments: (See PAT note written 02/19/2020 by Myra Gianotti, PA-C. )       Anesthesia Quick Evaluation

## 2020-02-20 ENCOUNTER — Ambulatory Visit (HOSPITAL_COMMUNITY): Payer: 59 | Admitting: Vascular Surgery

## 2020-02-20 ENCOUNTER — Encounter (HOSPITAL_COMMUNITY)
Admission: RE | Disposition: A | Discharge status: Home / Self Care | Payer: Self-pay | Source: Home / Self Care | Attending: Family Medicine

## 2020-02-20 ENCOUNTER — Encounter (HOSPITAL_COMMUNITY): Payer: Self-pay | Admitting: Family Medicine

## 2020-02-20 ENCOUNTER — Other Ambulatory Visit: Payer: Self-pay

## 2020-02-20 ENCOUNTER — Ambulatory Visit (HOSPITAL_COMMUNITY)
Admission: RE | Admit: 2020-02-20 | Discharge: 2020-02-20 | Disposition: A | Discharge status: Home / Self Care | Payer: 59 | Source: Ambulatory Visit | Attending: Family Medicine | Admitting: Family Medicine

## 2020-02-20 ENCOUNTER — Ambulatory Visit (HOSPITAL_COMMUNITY)
Admission: RE | Admit: 2020-02-20 | Discharge: 2020-02-20 | Disposition: A | Discharge status: Home / Self Care | Payer: 59 | Attending: Family Medicine | Admitting: Family Medicine

## 2020-02-20 DIAGNOSIS — Z8551 Personal history of malignant neoplasm of bladder: Secondary | ICD-10-CM | POA: Insufficient documentation

## 2020-02-20 DIAGNOSIS — M545 Low back pain, unspecified: Secondary | ICD-10-CM

## 2020-02-20 DIAGNOSIS — Z791 Long term (current) use of non-steroidal anti-inflammatories (NSAID): Secondary | ICD-10-CM | POA: Diagnosis not present

## 2020-02-20 DIAGNOSIS — I10 Essential (primary) hypertension: Secondary | ICD-10-CM | POA: Diagnosis not present

## 2020-02-20 DIAGNOSIS — Z7982 Long term (current) use of aspirin: Secondary | ICD-10-CM | POA: Insufficient documentation

## 2020-02-20 DIAGNOSIS — M5416 Radiculopathy, lumbar region: Secondary | ICD-10-CM

## 2020-02-20 DIAGNOSIS — E785 Hyperlipidemia, unspecified: Secondary | ICD-10-CM | POA: Insufficient documentation

## 2020-02-20 DIAGNOSIS — Z87891 Personal history of nicotine dependence: Secondary | ICD-10-CM | POA: Insufficient documentation

## 2020-02-20 DIAGNOSIS — M489 Spondylopathy, unspecified: Secondary | ICD-10-CM | POA: Diagnosis not present

## 2020-02-20 DIAGNOSIS — M48061 Spinal stenosis, lumbar region without neurogenic claudication: Secondary | ICD-10-CM | POA: Insufficient documentation

## 2020-02-20 DIAGNOSIS — Z79899 Other long term (current) drug therapy: Secondary | ICD-10-CM | POA: Diagnosis not present

## 2020-02-20 DIAGNOSIS — M5116 Intervertebral disc disorders with radiculopathy, lumbar region: Secondary | ICD-10-CM | POA: Insufficient documentation

## 2020-02-20 HISTORY — DX: Malignant (primary) neoplasm, unspecified: C80.1

## 2020-02-20 HISTORY — PX: RADIOLOGY WITH ANESTHESIA: SHX6223

## 2020-02-20 SURGERY — MRI WITH ANESTHESIA
Anesthesia: General

## 2020-02-20 MED ORDER — PROPOFOL 10 MG/ML IV BOLUS
INTRAVENOUS | Status: DC | PRN
  Administered 2020-02-20: 200 mg via INTRAVENOUS

## 2020-02-20 MED ORDER — ORAL CARE MOUTH RINSE: 15.0000 mL | Freq: Once | OROMUCOSAL | Status: AC

## 2020-02-20 MED ORDER — CHLORHEXIDINE GLUCONATE 0.12 % MT SOLN: 15.0000 mL | Freq: Once | OROMUCOSAL | Status: AC

## 2020-02-20 MED ORDER — DEXAMETHASONE SODIUM PHOSPHATE 10 MG/ML IJ SOLN
INTRAMUSCULAR | Status: DC | PRN
  Administered 2020-02-20: 4 mg via INTRAVENOUS

## 2020-02-20 MED ORDER — CHLORHEXIDINE GLUCONATE 0.12 % MT SOLN
OROMUCOSAL | Status: AC
  Administered 2020-02-20: 15 mL via OROMUCOSAL
  Filled 2020-02-20: qty 15

## 2020-02-20 MED ORDER — ONDANSETRON HCL 4 MG/2ML IJ SOLN
INTRAMUSCULAR | Status: DC | PRN
  Administered 2020-02-20: 4 mg via INTRAVENOUS

## 2020-02-20 MED ORDER — LIDOCAINE 2% (20 MG/ML) 5 ML SYRINGE
INTRAMUSCULAR | Status: DC | PRN
  Administered 2020-02-20: 80 mg via INTRAVENOUS

## 2020-02-20 MED ORDER — LACTATED RINGERS IV SOLN: INTRAVENOUS | Status: DC

## 2020-02-20 NOTE — Progress Notes (Signed)
MRI form faxed to Radiology Department 

## 2020-02-20 NOTE — Transfer of Care (Signed)
Immediate Anesthesia Transfer of Care Note  Patient: Brian Benton  Procedure(s) Performed: MRI LUMBAR W/O CONTRAST  WITH ANESTHESIA (N/A )  Patient Location: PACU  Anesthesia Type:General  Level of Consciousness: awake, alert  and oriented  Airway & Oxygen Therapy: Patient Spontanous Breathing  Post-op Assessment: Report given to RN and Post -op Vital signs reviewed and stable  Post vital signs: Reviewed and stable  Last Vitals:  Vitals Value Taken Time  BP 132/78 02/20/20 1131  Temp    Pulse 74 02/20/20 1132  Resp 19 02/20/20 1132  SpO2 95 % 02/20/20 1132  Vitals shown include unvalidated device data.  Last Pain:  Vitals:   02/20/20 0820  PainSc: 3          Complications: No complications documented.

## 2020-02-20 NOTE — Anesthesia Procedure Notes (Signed)
Procedure Name: LMA Insertion Date/Time: 02/20/2020 10:48 AM Performed by: Imagene Riches, CRNA Pre-anesthesia Checklist: Patient identified, Emergency Drugs available, Suction available and Patient being monitored Patient Re-evaluated:Patient Re-evaluated prior to induction Oxygen Delivery Method: Circle System Utilized Preoxygenation: Pre-oxygenation with 100% oxygen Induction Type: IV induction Ventilation: Mask ventilation without difficulty LMA: LMA inserted LMA Size: 4.0 Number of attempts: 1 Airway Equipment and Method: Bite block Placement Confirmation: positive ETCO2 Tube secured with: Tape Dental Injury: Teeth and Oropharynx as per pre-operative assessment

## 2020-02-21 ENCOUNTER — Encounter: Payer: Self-pay | Admitting: *Deleted

## 2020-02-21 ENCOUNTER — Encounter (HOSPITAL_COMMUNITY): Payer: Self-pay | Admitting: Radiology

## 2020-02-21 NOTE — Progress Notes (Signed)
Reached out to Brian Benton to introduce myself as the office RN Navigator and explain our new patient process. Reviewed the reason for their referral and scheduled their new patient appointment along with labs. Provided address and directions to the office including call back phone number. At this time patient has no further questions or needs.   Oncology Nurse Navigator Documentation  Oncology Nurse Navigator Flowsheets 02/21/2020  Abnormal Finding Date 02/20/2020  Diagnosis Status Additional Work Up  Navigator Follow Up Date: 02/25/2020  Navigator Follow Up Reason: New Patient Appointment  Navigator Location [facility]  Referral Date to RadOnc/MedOnc 02/20/2020  Navigator Encounter Type Introductory Phone Call  Patient Visit Type MedOnc  Treatment Phase Abnormal Scans  Barriers/Navigation Needs Coordination of Care;Education  Education Other  Interventions Coordination of Care;Education  Acuity Level 2-Minimal Needs (1-2 Barriers Identified)  Coordination of Care Appts  Education Method Verbal  Time Spent with Patient 30

## 2020-02-24 NOTE — Anesthesia Postprocedure Evaluation (Signed)
Anesthesia Post Note  Patient: KEVAN PROUTY  Procedure(s) Performed: MRI LUMBAR W/O CONTRAST  WITH ANESTHESIA (N/A )     Patient location during evaluation: PACU Anesthesia Type: General Level of consciousness: awake and alert Pain management: pain level controlled Vital Signs Assessment: post-procedure vital signs reviewed and stable Respiratory status: spontaneous breathing, nonlabored ventilation, respiratory function stable and patient connected to nasal cannula oxygen Cardiovascular status: blood pressure returned to baseline and stable Postop Assessment: no apparent nausea or vomiting Anesthetic complications: no   No complications documented.  Last Vitals:  Vitals:   02/20/20 1130 02/20/20 1145  BP: 132/78 127/84  Pulse: 75 76  Resp: 19 14  Temp: 36.7 C 36.9 C  SpO2: 94% 95%    Last Pain:  Vitals:   02/20/20 1130  PainSc: 0-No pain                 Darnette Lampron

## 2020-02-25 ENCOUNTER — Other Ambulatory Visit: Payer: Self-pay | Admitting: Hematology & Oncology

## 2020-02-25 ENCOUNTER — Inpatient Hospital Stay (HOSPITAL_BASED_OUTPATIENT_CLINIC_OR_DEPARTMENT_OTHER): Payer: 59 | Admitting: Hematology & Oncology

## 2020-02-25 ENCOUNTER — Inpatient Hospital Stay: Payer: 59 | Attending: Hematology & Oncology

## 2020-02-25 ENCOUNTER — Other Ambulatory Visit: Payer: Self-pay

## 2020-02-25 VITALS — BP 143/83 | HR 89 | Temp 99.2°F

## 2020-02-25 DIAGNOSIS — M79652 Pain in left thigh: Secondary | ICD-10-CM | POA: Diagnosis not present

## 2020-02-25 DIAGNOSIS — Z8551 Personal history of malignant neoplasm of bladder: Secondary | ICD-10-CM | POA: Insufficient documentation

## 2020-02-25 DIAGNOSIS — Z87891 Personal history of nicotine dependence: Secondary | ICD-10-CM | POA: Insufficient documentation

## 2020-02-25 DIAGNOSIS — Z806 Family history of leukemia: Secondary | ICD-10-CM | POA: Insufficient documentation

## 2020-02-25 DIAGNOSIS — C679 Malignant neoplasm of bladder, unspecified: Secondary | ICD-10-CM | POA: Insufficient documentation

## 2020-02-25 DIAGNOSIS — R1900 Intra-abdominal and pelvic swelling, mass and lump, unspecified site: Secondary | ICD-10-CM

## 2020-02-25 DIAGNOSIS — R7989 Other specified abnormal findings of blood chemistry: Secondary | ICD-10-CM | POA: Insufficient documentation

## 2020-02-25 DIAGNOSIS — Z5111 Encounter for antineoplastic chemotherapy: Secondary | ICD-10-CM | POA: Diagnosis present

## 2020-02-25 DIAGNOSIS — C799 Secondary malignant neoplasm of unspecified site: Secondary | ICD-10-CM | POA: Insufficient documentation

## 2020-02-25 LAB — CMP (CANCER CENTER ONLY)
ALT: 352 U/L (ref 0–44)
AST: 148 U/L — ABNORMAL HIGH (ref 15–41)
Albumin: 3.9 g/dL (ref 3.5–5.0)
Alkaline Phosphatase: 421 U/L — ABNORMAL HIGH (ref 38–126)
Anion gap: 6 (ref 5–15)
BUN: 12 mg/dL (ref 8–23)
CO2: 29 mmol/L (ref 22–32)
Calcium: 9.6 mg/dL (ref 8.9–10.3)
Chloride: 104 mmol/L (ref 98–111)
Creatinine: 0.75 mg/dL (ref 0.61–1.24)
GFR, Est AFR Am: >60 mL/min (ref >60)
GFR, Estimated: >60 mL/min (ref >60)
Glucose, Bld: 97 mg/dL (ref 70–99)
Potassium: 4.1 mmol/L (ref 3.5–5.1)
Sodium: 139 mmol/L (ref 135–145)
Total Bilirubin: 0.3 mg/dL (ref 0.3–1.2)
Total Protein: 7.1 g/dL (ref 6.5–8.1)

## 2020-02-25 LAB — CBC WITH DIFFERENTIAL (CANCER CENTER ONLY)
Abs Immature Granulocytes: 0.05 10*3/uL (ref 0.00–0.07)
Basophils Absolute: 0.2 10*3/uL — ABNORMAL HIGH (ref 0.0–0.1)
Basophils Relative: 2 %
Eosinophils Absolute: 2.5 10*3/uL — ABNORMAL HIGH (ref 0.0–0.5)
Eosinophils Relative: 18 %
HCT: 35.3 % — ABNORMAL LOW (ref 39.0–52.0)
Hemoglobin: 11.8 g/dL — ABNORMAL LOW (ref 13.0–17.0)
Immature Granulocytes: 0 %
Lymphocytes Relative: 13 %
Lymphs Abs: 1.7 10*3/uL (ref 0.7–4.0)
MCH: 31.3 pg (ref 26.0–34.0)
MCHC: 33.4 g/dL (ref 30.0–36.0)
MCV: 93.6 fL (ref 80.0–100.0)
Monocytes Absolute: 1.1 10*3/uL — ABNORMAL HIGH (ref 0.1–1.0)
Monocytes Relative: 8 %
Neutro Abs: 8.2 10*3/uL — ABNORMAL HIGH (ref 1.7–7.7)
Neutrophils Relative %: 59 %
Platelet Count: 346 10*3/uL (ref 150–400)
RBC: 3.77 MIL/uL — ABNORMAL LOW (ref 4.22–5.81)
RDW: 15.6 % — ABNORMAL HIGH (ref 11.5–15.5)
WBC Count: 13.7 10*3/uL — ABNORMAL HIGH (ref 4.0–10.5)
nRBC: 0 % (ref 0.0–0.2)

## 2020-02-25 MED ORDER — GABAPENTIN 600 MG PO TABS
600.0000 mg | ORAL_TABLET | Freq: Four times a day (QID) | ORAL | 3 refills | Status: DC
Start: 2020-02-25 — End: 2020-04-13

## 2020-02-25 MED ORDER — FENTANYL 50 MCG/HR TD PT72: 1.0000 | MEDICATED_PATCH | TRANSDERMAL | 0 refills | Status: AC

## 2020-02-25 MED ORDER — TEMAZEPAM 30 MG PO CAPS: 30.0000 mg | ORAL_CAPSULE | Freq: Every evening | ORAL | 0 refills | Status: DC | PRN

## 2020-02-25 MED ORDER — OXYCODONE HCL 10 MG PO TABS: 10.0000 mg | ORAL_TABLET | ORAL | 0 refills | Status: DC | PRN

## 2020-02-25 MED FILL — GABAPENTIN 600 MG TABLET: 600 | 30 days supply | Qty: 120 | Fill #0

## 2020-02-25 MED FILL — oxyCODONE HCL 10 MG TABS: 10 | 15 days supply | Qty: 90 | Fill #0

## 2020-02-25 MED FILL — fentaNYL 50 MCG/HR PT72: 50 | 30 days supply | Qty: 10 | Fill #0

## 2020-02-25 MED FILL — TEMAZEPAM 30 MG CAPSULE: 30 | 30 days supply | Qty: 30 | Fill #0

## 2020-02-25 NOTE — Progress Notes (Signed)
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 : I have pain in my left leg.  HPI: [name] is a very nice 64 year old white male.  Here is originally from [place].  He works for a [employer], [employer]/video.  He has lived in different places in the country.  He has been in great health.  He is still working.  He likes to work out quite a bit.  In June, he began to note some discomfort in the lower back and left leg.  This was in the inner aspect of the left thigh.  There is no weakness.  There is no change in bowel or bladder habits.  Of note, he did have bladder cancer back in 2015.  This sounds like superficial bladder cancer that was treated with intravesicular therapy.  He continued to have the pain.  It was now on the outside of his left thigh.  He had no weakness.  He subsequently what I think was given some Neurontin.  This seemed to help a little bit.  Finally, he had an MRI of the lumbar spine.  This was done on 02/20/2020.  Surprisingly, this showed a 5.6 x 4.8 x 7 point centimeter mass on the left side of the spine.  This extended from L4-S1.  It appeared to invade the left sacral ala.  Also involved the left lateral aspect of L5 vertebral body and left L5 transverse process.  There is seem to be some involvement of the psoas and iliac is muscles.  There is no obvious cord involvement or compression.    He does have history of tobacco use.  I think he stopped about 4 5 years ago.  He really does not drink.  There is been no weight loss.  He has had no problems with bowels or bladder.  He had a colonoscopy a year ago.  There is no cough or shortness of breath.  He has had his coronavirus vaccines.  There is no headache.  There is really no history of cancer in the family.  I would have to say that overall, his performance status is ECOG 1.    Past Medical History:  Diagnosis Date  . Cancer  ([place])    bladder  . History of bladder cancer    s/p  TURBT 07-25-2014  . Hyperlipidemia   . Mass of left hand   :  Past Surgical History:  Procedure Laterality Date  . BLADDER SURGERY     [place]  . COLONOSCOPY    . CYSTECTOMY    . CYSTOSCOPY WITH BIOPSY N/A 07/25/2014   Procedure: CYSTOSCOPY WITH BLADDER BIOPSY AND FULGERATION;  Surgeon: [name], MD;  Location: [facility];  Service: Urology;  Laterality: N/A;  . CYSTOSCOPY WITH BIOPSY N/A 11/28/2014   Procedure: CYSTOSCOPY WITH  BLADDER BIOPSY;  Surgeon: [name], MD;  Location: [facility];  Service: Urology;  Laterality: N/A;  . HEMORRHOID SURGERY  03/15/2012   Procedure: HEMORRHOIDECTOMY;  Surgeon: [name], MD;  [facility];  Service: General;  Laterality: N/A;  . HERNIA REPAIR    . INGUINAL HERNIA REPAIR Bilateral 2006  . MASS EXCISION Left 11/12/2018   Procedure: EXCISION MASS LEFT HAND;  Surgeon: Daryll Brod, MD;  Location: [place];  Service: Orthopedics;  Laterality: Left;  FAB  . RADIOLOGY WITH ANESTHESIA N/A 02/20/2020   Procedure: MRI LUMBAR W/O  CONTRAST  WITH ANESTHESIA;  Surgeon: Radiologist, Medication, MD;  Location: [facility];  Service: Radiology;  Laterality: N/A;  . TESTICLE SURGERY  2004   Ruptured Undescended Right testicle   . TRANSURETHRAL RESECTION OF BLADDER TUMOR WITH GYRUS (TURBT-GYRUS) N/A 05/23/2014   Procedure: TRANSURETHRAL RESECTION OF BLADDER TUMOR WITH GYRUS (TURBT-GYRUS);  Surgeon: [name], MD;  Location: [facility];  Service: Urology;  Laterality: N/A;  . WISDOM TOOTH EXTRACTION    :   Current Outpatient Medications:  .  ASPIRIN ADULT LOW STRENGTH 81 MG EC tablet, TAKE 1 TABLET BY MOUTH EVERY DAY (Patient taking differently: Take 81 mg by mouth daily. ), Disp: 30 tablet, Rfl: 11 .  brimonidine (ALPHAGAN) 0.2 % ophthalmic solution, Place 1 drop into the left eye 2 (two) times daily. (Patient not taking: Reported on  02/12/2020), Disp: 10 mL, Rfl: 11 .  calcitonin, salmon, (MIACALCIN/FORTICAL) 200 UNIT/ACT nasal spray, Place 1 spray into alternate nostrils daily., Disp: , Rfl:  .  Calcium Carbonate-Vit D-Min (CALTRATE 600+D PLUS MINERALS PO), Take 1 tablet by mouth daily., Disp: , Rfl:  .  Cholecalciferol (VITAMIN D) 50 MCG (2000 UT) tablet, Take 2,000 Units by mouth daily., Disp: , Rfl:  .  fentaNYL (DURAGESIC) 50 MCG/HR, Place 1 patch onto the skin every 3 (three) days for 3 days., Disp: 10 patch, Rfl: 0 .  gabapentin (NEURONTIN) 600 MG tablet, Take 1 tablet (600 mg total) by mouth in the morning, at noon, in the evening, and at bedtime., Disp: 120 tablet, Rfl: 3 .  meloxicam (MOBIC) 15 MG tablet, Take 15 mg by mouth daily., Disp: , Rfl:  .  MULTIPLE MINERALS PO, Take 2 tablets by mouth daily. , Disp: , Rfl:  .  oxyCODONE 10 MG TABS, Take 1 tablet (10 mg total) by mouth every 4 (four) hours as needed for severe pain., Disp: 90 tablet, Rfl: 0 .  temazepam (RESTORIL) 30 MG capsule, Take 1 capsule (30 mg total) by mouth at bedtime as needed for sleep., Disp: 30 capsule, Rfl: 0:  :  No Known Allergies:  Family History  Problem Relation Age of Onset  . Cancer Mother        leukemia  . Diabetes Brother   :  Social History   Socioeconomic History  . Marital status: Married    Spouse name: Not on file  . Number of children: 2  . Years of education: Not on file  . Highest education level: Not on file  Occupational History  . Not on file  Tobacco Use  . Smoking status: Former Smoker    Packs/day: 0.50    Years: 30.00    Pack years: 15.00    Types: Cigarettes    Quit date: 01/01/2019    Years since quitting: 1.1  . Smokeless tobacco: Never Used  Vaping Use  . Vaping Use: Never used  Substance and Sexual Activity  . Alcohol use: Not Currently    Comment: social  . Drug use: No  . Sexual activity: Not on file  Other Topics Concern  . Not on file  Social History Narrative  . Not on file    Social Determinants of Health   Financial Resource Strain:   . Difficulty of Paying Living Expenses: Not on file  Food Insecurity:   . Worried About Charity fundraiser in the Last Year: Not on file  . Ran Out of Food in the Last Year: Not on file  Transportation Needs:   .  Lack of Transportation (Medical): Not on file  . Lack of Transportation (Non-Medical): Not on file  Physical Activity:   . Days of Exercise per Week: Not on file  . Minutes of Exercise per Session: Not on file  Stress:   . Feeling of Stress : Not on file  Social Connections:   . Frequency of Communication with Friends and Family: Not on file  . Frequency of Social Gatherings with Friends and Family: Not on file  . Attends Religious Services: Not on file  . Active Member of Clubs or Organizations: Not on file  . Attends Archivist Meetings: Not on file  . Marital Status: Not on file  Intimate Partner Violence:   . Fear of Current or Ex-Partner: Not on file  . Emotionally Abused: Not on file  . Physically Abused: Not on file  . Sexually Abused: Not on file  :  Review of Systems  Constitutional: Negative.   HENT: Negative.   Eyes: Negative.   Respiratory: Negative.   Cardiovascular: Negative.   Genitourinary: Negative.   Musculoskeletal: Positive for back pain.  Skin: Negative.   Neurological: Negative.   Endo/Heme/Allergies: Negative.   Psychiatric/Behavioral: Negative.      Exam:  This is a well-developed and well-nourished white male in no obvious distress.  Vital signs show temperature of 98.  Pulse 89.  Blood pressure 143/83.  Weight is 166 pounds. Head and neck exam shows no ocular or oral lesions.  There is no scleral icterus.  He has no adenopathy in the neck.  Thyroid is not palpable.  Lungs are clear to percussion and auscultation bilaterally.  Cardiac exam regular rate and rhythm with no murmurs, rubs or bruits.  Abdomen is soft.  He has good bowel sounds.  There is no fluid  wave.  There is no palpable liver edge.  He has no palpable splenomegaly.  Back exam shows no tenderness over the spine, ribs or hips.  Extremities shows no clubbing, cyanosis or edema.  He has decent muscle strength in upper and lower extremities.  He has good range of motion of his joints.  Neurological exam shows no focal neurological deficits.  Skin exam shows no rashes, ecchymoses or petechia.   @IPVITALS @   Recent Labs    02/25/20 1510  WBC 13.7*  HGB 11.8*  HCT 35.3*  PLT 346   Recent Labs    02/25/20 1510  NA 139  K 4.1  CL 104  CO2 29  GLUCOSE 97  BUN 12  CREATININE 0.75  CALCIUM 9.6    Blood smear review: None  Pathology: Pending    Assessment and Plan: [name] is a very nice 64 year old white male.  He has this left paraspinal mass down at L4-S1.  It is really hard to say what this mass is.  I would have to believe that it is malignant.  I think the only thing that would not be malignant would be retroperitoneal fibrosis.  Clearly, the best diagnosis as this could be would be lymphoma.  Again this would be a little unusual.  I am a little bit worried that his LFTs are so elevated.  He is on a statin drug which I told him to stop.  He did smoke.  Bronchogenic carcinoma, particularly small cell carcinoma would be possible.  We really need to get a CT scan of his body to see if there is any obvious primary site that we can find  It would also be nice  if we could find another area of involvement by cancer that would be more amenable to biopsy.  His daughter came up from [place].  She is a Marine scientist.  She helped [place] out quite a bit.  He is in quite a bit of discomfort.  He is having some radicular pain in the left thigh.  He had been on Neurontin.  Somehow this was not refilled.  We will get him back on Neurontin.  He will take 600 mg p.o. 4 times daily.  I think a Duragesic patch is reasonable.  He came in with a 25 mcg patch.  This is helped a little bit.  I  think a 50 mcg patch would be more beneficial.  I will also put him on some oxycodone at 10 mg for breakthrough pain.  He is going need local therapy.  Radiation therapy would be helpful.  Again, we need to see what kind of process this is.  Hopefully, we get the CAT scan tomorrow.  We will see if this shows [place] any possible primary site.  Will show [place] if there is any liver involvement.  I think the worst possibility that we could have would be sarcoma.  This would be notoriously difficult to treat.  I spent over an hour with [name] and his daughter.  They are both very very nice.  I just want to make sure that we try to move ahead as quickly as possible so that we can figure out what is going on then make our treatment recommendations.

## 2020-02-26 ENCOUNTER — Encounter: Payer: Self-pay | Admitting: *Deleted

## 2020-02-26 ENCOUNTER — Ambulatory Visit (HOSPITAL_BASED_OUTPATIENT_CLINIC_OR_DEPARTMENT_OTHER)
Admission: RE | Admit: 2020-02-26 | Discharge: 2020-02-26 | Disposition: A | Discharge status: Home / Self Care | Payer: 59 | Source: Ambulatory Visit | Attending: Hematology & Oncology | Admitting: Hematology & Oncology

## 2020-02-26 ENCOUNTER — Encounter (HOSPITAL_BASED_OUTPATIENT_CLINIC_OR_DEPARTMENT_OTHER): Payer: Self-pay

## 2020-02-26 ENCOUNTER — Ambulatory Visit (HOSPITAL_BASED_OUTPATIENT_CLINIC_OR_DEPARTMENT_OTHER): Payer: 59

## 2020-02-26 DIAGNOSIS — R1900 Intra-abdominal and pelvic swelling, mass and lump, unspecified site: Secondary | ICD-10-CM | POA: Diagnosis present

## 2020-02-26 LAB — PROTEIN ELECTROPHORESIS, SERUM, WITH REFLEX
A/G Ratio: 0.8 (ref 0.7–1.7)
Albumin ELP: 3.2 g/dL (ref 2.9–4.4)
Alpha-1-Globulin: 0.4 g/dL (ref 0.0–0.4)
Alpha-2-Globulin: 1.1 g/dL — ABNORMAL HIGH (ref 0.4–1.0)
Beta Globulin: 1.1 g/dL (ref 0.7–1.3)
Gamma Globulin: 1.3 g/dL (ref 0.4–1.8)
Globulin, Total: 3.8 g/dL (ref 2.2–3.9)
Total Protein ELP: 7 g/dL (ref 6.0–8.5)

## 2020-02-26 LAB — IGG, IGA, IGM
IgA: 264 mg/dL (ref 61–437)
IgG (Immunoglobin G), Serum: 1193 mg/dL (ref 603–1613)
IgM (Immunoglobulin M), Srm: 89 mg/dL (ref 20–172)

## 2020-02-26 LAB — LACTATE DEHYDROGENASE: LDH: 212 U/L — ABNORMAL HIGH (ref 98–192)

## 2020-02-26 LAB — KAPPA/LAMBDA LIGHT CHAINS
Kappa free light chain: 34.8 mg/L — ABNORMAL HIGH (ref 3.3–19.4)
Kappa, lambda light chain ratio: 1.71 — ABNORMAL HIGH (ref 0.26–1.65)
Lambda free light chains: 20.4 mg/L (ref 5.7–26.3)

## 2020-02-26 LAB — CEA (IN HOUSE-CHCC): CEA (CHCC-In House): 12.1 ng/mL — ABNORMAL HIGH (ref 0.00–5.00)

## 2020-02-26 MED ORDER — IOHEXOL 300 MG/ML  SOLN
100.0000 mL | Freq: Once | INTRAMUSCULAR | Status: AC | PRN
  Administered 2020-02-26: 100 mL via INTRAVENOUS

## 2020-02-26 NOTE — Addendum Note (Signed)
Addended by: Burney Gauze R on: 02/26/2020 05:25 PM   Modules accepted: Orders

## 2020-02-26 NOTE — Progress Notes (Signed)
Initial RN Navigator Patient Visit  Name: Brian Benton Date of Referral : 02/20/20 Diagnosis: Paraspinal Mass  Navigator was out of the office for this patient's New Patient Appointment. Reviewed visit for patient needs.   Gave patient "Your Patient Navigator" handout which explains my role, areas in which I am able to help, and all the contact information for myself and the office. Patient also given MD and Navigator business card.   New patient packet given to patient which includes: orientation to office and staff; campus directory; education on My Chart and Advance Directives; and patient centered education on cancer provided to patient by desk RN.   Patient needed STAT Ct completed today. Insurance PA was obtained and CT scan completed this afternoon.  Reviewed results with Dr Marin Olp. An order will be placed for CT guided biopsy. Attempted to call the patient several times, but each time it went to a mailbox which has not yet been set up. Information sent to patient via MyChart. Reviewed CT results. Also notified him that scheduling will be reaching out to schedule biopsy with him. I will also continue to follow to ensure biopsy gets scheduled.   Patient instructed on all follow up procedures and expectations. They have my number to reach out for any further clarification or additional needs.

## 2020-02-27 ENCOUNTER — Encounter (HOSPITAL_COMMUNITY): Payer: Self-pay | Admitting: Radiology

## 2020-02-27 NOTE — Progress Notes (Unsigned)
Brian Benton. Brian Benton Male, 64 y.o., Jan 11, 1956 MRN:  [id number] Phone:  [phone] Brian Benton) PCP:  Aurea Graff, PA-C Coverage:  [place] [employer] Other Next Appt With Radiology ([facility]) 03/05/2020 at 11:00 AM  RE: CT Biopsy Received: Today Arne Cleveland, MD  Jillyn [place]   CT core L retroperit/pelvic mass  Lymphoma v sarcoma   [facility]       Previous Messages   ----- Message -----  From: Garth Bigness D  Sent: 02/27/2020  8:39 AM EDT  To: Ir Procedure Requests  Subject: CT Biopsy                     Procedure:  CT Biopsy   Reason: Retroperitoneal mass, left paraspinal mass/psoas mass. need core biopsies!! ?? lymphoma. ?? Sarcoma   History: MR, CT in computer   Provider: Volanda Napoleon   Provider Contact: [phone]

## 2020-03-01 ENCOUNTER — Inpatient Hospital Stay (HOSPITAL_BASED_OUTPATIENT_CLINIC_OR_DEPARTMENT_OTHER)
Admission: EM | Admit: 2020-03-01 | Discharge: 2020-03-06 | DRG: 300 | Disposition: A | Discharge status: Home Health | Payer: 59 | Attending: Internal Medicine | Admitting: Internal Medicine

## 2020-03-01 ENCOUNTER — Other Ambulatory Visit: Payer: Self-pay

## 2020-03-01 ENCOUNTER — Encounter (HOSPITAL_BASED_OUTPATIENT_CLINIC_OR_DEPARTMENT_OTHER): Payer: Self-pay | Admitting: Emergency Medicine

## 2020-03-01 DIAGNOSIS — I82432 Acute embolism and thrombosis of left popliteal vein: Secondary | ICD-10-CM | POA: Diagnosis present

## 2020-03-01 DIAGNOSIS — Z7982 Long term (current) use of aspirin: Secondary | ICD-10-CM

## 2020-03-01 DIAGNOSIS — C786 Secondary malignant neoplasm of retroperitoneum and peritoneum: Secondary | ICD-10-CM | POA: Diagnosis present

## 2020-03-01 DIAGNOSIS — R1900 Intra-abdominal and pelvic swelling, mass and lump, unspecified site: Secondary | ICD-10-CM

## 2020-03-01 DIAGNOSIS — I10 Essential (primary) hypertension: Secondary | ICD-10-CM | POA: Diagnosis present

## 2020-03-01 DIAGNOSIS — Z20822 Contact with and (suspected) exposure to covid-19: Secondary | ICD-10-CM | POA: Diagnosis present

## 2020-03-01 DIAGNOSIS — Z720 Tobacco use: Secondary | ICD-10-CM | POA: Diagnosis present

## 2020-03-01 DIAGNOSIS — Z791 Long term (current) use of non-steroidal anti-inflammatories (NSAID): Secondary | ICD-10-CM

## 2020-03-01 DIAGNOSIS — I82401 Acute embolism and thrombosis of unspecified deep veins of right lower extremity: Secondary | ICD-10-CM

## 2020-03-01 DIAGNOSIS — Z79899 Other long term (current) drug therapy: Secondary | ICD-10-CM

## 2020-03-01 DIAGNOSIS — I82452 Acute embolism and thrombosis of left peroneal vein: Secondary | ICD-10-CM | POA: Diagnosis present

## 2020-03-01 DIAGNOSIS — M7989 Other specified soft tissue disorders: Secondary | ICD-10-CM

## 2020-03-01 DIAGNOSIS — C679 Malignant neoplasm of bladder, unspecified: Secondary | ICD-10-CM

## 2020-03-01 DIAGNOSIS — R222 Localized swelling, mass and lump, trunk: Secondary | ICD-10-CM

## 2020-03-01 DIAGNOSIS — Z79891 Long term (current) use of opiate analgesic: Secondary | ICD-10-CM

## 2020-03-01 DIAGNOSIS — D72829 Elevated white blood cell count, unspecified: Secondary | ICD-10-CM | POA: Diagnosis present

## 2020-03-01 DIAGNOSIS — I82442 Acute embolism and thrombosis of left tibial vein: Secondary | ICD-10-CM | POA: Diagnosis not present

## 2020-03-01 DIAGNOSIS — I82422 Acute embolism and thrombosis of left iliac vein: Secondary | ICD-10-CM | POA: Diagnosis present

## 2020-03-01 DIAGNOSIS — Z8551 Personal history of malignant neoplasm of bladder: Secondary | ICD-10-CM

## 2020-03-01 DIAGNOSIS — E785 Hyperlipidemia, unspecified: Secondary | ICD-10-CM | POA: Diagnosis present

## 2020-03-01 DIAGNOSIS — I82402 Acute embolism and thrombosis of unspecified deep veins of left lower extremity: Secondary | ICD-10-CM

## 2020-03-01 DIAGNOSIS — I82412 Acute embolism and thrombosis of left femoral vein: Secondary | ICD-10-CM | POA: Diagnosis present

## 2020-03-01 DIAGNOSIS — F17210 Nicotine dependence, cigarettes, uncomplicated: Secondary | ICD-10-CM | POA: Diagnosis present

## 2020-03-01 NOTE — ED Triage Notes (Signed)
Reports edema to L leg with discoloration that started just today. Pt does have a known tumor in L4-S1 but has not seen oncology yet. Pt states he has reduced sensation in that leg as well.

## 2020-03-02 ENCOUNTER — Emergency Department (HOSPITAL_BASED_OUTPATIENT_CLINIC_OR_DEPARTMENT_OTHER): Payer: 59

## 2020-03-02 ENCOUNTER — Encounter (HOSPITAL_BASED_OUTPATIENT_CLINIC_OR_DEPARTMENT_OTHER): Payer: Self-pay

## 2020-03-02 DIAGNOSIS — Z7982 Long term (current) use of aspirin: Secondary | ICD-10-CM | POA: Diagnosis not present

## 2020-03-02 DIAGNOSIS — Z79899 Other long term (current) drug therapy: Secondary | ICD-10-CM | POA: Diagnosis not present

## 2020-03-02 DIAGNOSIS — F17210 Nicotine dependence, cigarettes, uncomplicated: Secondary | ICD-10-CM | POA: Diagnosis present

## 2020-03-02 DIAGNOSIS — I10 Essential (primary) hypertension: Secondary | ICD-10-CM | POA: Diagnosis present

## 2020-03-02 DIAGNOSIS — Z8551 Personal history of malignant neoplasm of bladder: Secondary | ICD-10-CM | POA: Diagnosis not present

## 2020-03-02 DIAGNOSIS — I824Y2 Acute embolism and thrombosis of unspecified deep veins of left proximal lower extremity: Secondary | ICD-10-CM | POA: Diagnosis not present

## 2020-03-02 DIAGNOSIS — I82402 Acute embolism and thrombosis of unspecified deep veins of left lower extremity: Secondary | ICD-10-CM | POA: Diagnosis not present

## 2020-03-02 DIAGNOSIS — M7989 Other specified soft tissue disorders: Secondary | ICD-10-CM | POA: Diagnosis present

## 2020-03-02 DIAGNOSIS — Z791 Long term (current) use of non-steroidal anti-inflammatories (NSAID): Secondary | ICD-10-CM | POA: Diagnosis not present

## 2020-03-02 DIAGNOSIS — I82452 Acute embolism and thrombosis of left peroneal vein: Secondary | ICD-10-CM | POA: Diagnosis present

## 2020-03-02 DIAGNOSIS — C786 Secondary malignant neoplasm of retroperitoneum and peritoneum: Secondary | ICD-10-CM | POA: Diagnosis present

## 2020-03-02 DIAGNOSIS — E785 Hyperlipidemia, unspecified: Secondary | ICD-10-CM | POA: Diagnosis present

## 2020-03-02 DIAGNOSIS — I82432 Acute embolism and thrombosis of left popliteal vein: Secondary | ICD-10-CM | POA: Diagnosis present

## 2020-03-02 DIAGNOSIS — Z20822 Contact with and (suspected) exposure to covid-19: Secondary | ICD-10-CM | POA: Diagnosis present

## 2020-03-02 DIAGNOSIS — I82442 Acute embolism and thrombosis of left tibial vein: Secondary | ICD-10-CM | POA: Diagnosis present

## 2020-03-02 DIAGNOSIS — R222 Localized swelling, mass and lump, trunk: Secondary | ICD-10-CM

## 2020-03-02 DIAGNOSIS — Z79891 Long term (current) use of opiate analgesic: Secondary | ICD-10-CM | POA: Diagnosis not present

## 2020-03-02 DIAGNOSIS — D72829 Elevated white blood cell count, unspecified: Secondary | ICD-10-CM | POA: Diagnosis present

## 2020-03-02 DIAGNOSIS — I82422 Acute embolism and thrombosis of left iliac vein: Secondary | ICD-10-CM | POA: Diagnosis present

## 2020-03-02 DIAGNOSIS — I82412 Acute embolism and thrombosis of left femoral vein: Secondary | ICD-10-CM | POA: Diagnosis present

## 2020-03-02 LAB — CBC WITH DIFFERENTIAL/PLATELET
Abs Immature Granulocytes: 0.1 10*3/uL — ABNORMAL HIGH (ref 0.00–0.07)
Basophils Absolute: 0.1 10*3/uL (ref 0.0–0.1)
Basophils Relative: 1 %
Eosinophils Absolute: 1.1 10*3/uL — ABNORMAL HIGH (ref 0.0–0.5)
Eosinophils Relative: 8 %
HCT: 36.7 % — ABNORMAL LOW (ref 39.0–52.0)
Hemoglobin: 12 g/dL — ABNORMAL LOW (ref 13.0–17.0)
Immature Granulocytes: 1 %
Lymphocytes Relative: 8 %
Lymphs Abs: 1.2 10*3/uL (ref 0.7–4.0)
MCH: 31.2 pg (ref 26.0–34.0)
MCHC: 32.7 g/dL (ref 30.0–36.0)
MCV: 95.3 fL (ref 80.0–100.0)
Monocytes Absolute: 1.1 10*3/uL — ABNORMAL HIGH (ref 0.1–1.0)
Monocytes Relative: 7 %
Neutro Abs: 10.9 10*3/uL — ABNORMAL HIGH (ref 1.7–7.7)
Neutrophils Relative %: 75 %
Platelets: 327 10*3/uL (ref 150–400)
RBC: 3.85 MIL/uL — ABNORMAL LOW (ref 4.22–5.81)
RDW: 15.9 % — ABNORMAL HIGH (ref 11.5–15.5)
WBC: 14.5 10*3/uL — ABNORMAL HIGH (ref 4.0–10.5)
nRBC: 0 % (ref 0.0–0.2)

## 2020-03-02 LAB — BASIC METABOLIC PANEL
Anion gap: 9 (ref 5–15)
BUN: 21 mg/dL (ref 8–23)
CO2: 28 mmol/L (ref 22–32)
Calcium: 9.3 mg/dL (ref 8.9–10.3)
Chloride: 98 mmol/L (ref 98–111)
Creatinine, Ser: 0.94 mg/dL (ref 0.61–1.24)
GFR calc Af Amer: >60 mL/min (ref >60)
GFR calc non Af Amer: >60 mL/min (ref >60)
Glucose, Bld: 119 mg/dL — ABNORMAL HIGH (ref 70–99)
Potassium: 5.1 mmol/L (ref 3.5–5.1)
Sodium: 135 mmol/L (ref 135–145)

## 2020-03-02 LAB — PROTIME-INR
INR: 1 (ref 0.8–1.2)
Prothrombin Time: 12.7 seconds (ref 11.4–15.2)

## 2020-03-02 LAB — HIV ANTIBODY (ROUTINE TESTING W REFLEX): HIV Screen 4th Generation wRfx: NONREACTIVE

## 2020-03-02 LAB — HEPARIN LEVEL (UNFRACTIONATED)
Heparin Unfractionated: 0.17 IU/mL — ABNORMAL LOW (ref 0.30–0.70)
Heparin Unfractionated: 0.12 IU/mL — ABNORMAL LOW (ref 0.30–0.70)

## 2020-03-02 LAB — SARS CORONAVIRUS 2 BY RT PCR (HOSPITAL ORDER, PERFORMED IN ~~LOC~~ HOSPITAL LAB): SARS Coronavirus 2: NEGATIVE

## 2020-03-02 MED ORDER — FENTANYL 50 MCG/HR TD PT72
1.0000 | MEDICATED_PATCH | TRANSDERMAL | Status: DC
  Administered 2020-03-02 – 2020-03-04 (×2): 1 via TRANSDERMAL
  Filled 2020-03-02 (×3): qty 1

## 2020-03-02 MED ORDER — ASPIRIN EC 81 MG PO TBEC
81.0000 mg | DELAYED_RELEASE_TABLET | Freq: Every day | ORAL | Status: DC
  Administered 2020-03-02 – 2020-03-06 (×5): 81 mg via ORAL
  Filled 2020-03-02 (×5): qty 1

## 2020-03-02 MED ORDER — CHLORHEXIDINE GLUCONATE CLOTH 2 % EX PADS
6.0000 | MEDICATED_PAD | Freq: Every day | CUTANEOUS | Status: DC
  Administered 2020-03-02 – 2020-03-03 (×2): 6 via TOPICAL

## 2020-03-02 MED ORDER — OXYCODONE HCL 5 MG PO TABS
10.0000 mg | ORAL_TABLET | Freq: Four times a day (QID) | ORAL | Status: DC | PRN
  Administered 2020-03-04 – 2020-03-05 (×2): 10 mg via ORAL
  Filled 2020-03-02 (×2): qty 2

## 2020-03-02 MED ORDER — SENNOSIDES-DOCUSATE SODIUM 8.6-50 MG PO TABS: 1.0000 | ORAL_TABLET | Freq: Every evening | ORAL | Status: DC | PRN

## 2020-03-02 MED ORDER — HYDROMORPHONE HCL 1 MG/ML IJ SOLN
1.0000 mg | INTRAMUSCULAR | Status: DC | PRN
  Administered 2020-03-02 – 2020-03-06 (×16): 1 mg via INTRAVENOUS
  Filled 2020-03-02 (×16): qty 1

## 2020-03-02 MED ORDER — MORPHINE SULFATE (PF) 4 MG/ML IV SOLN
4.0000 mg | Freq: Once | INTRAVENOUS | Status: AC
  Administered 2020-03-02: 4 mg via INTRAVENOUS
  Filled 2020-03-02: qty 1

## 2020-03-02 MED ORDER — ONDANSETRON HCL 4 MG/2ML IJ SOLN
4.0000 mg | Freq: Once | INTRAMUSCULAR | Status: AC
  Administered 2020-03-02: 4 mg via INTRAVENOUS
  Filled 2020-03-02: qty 2

## 2020-03-02 MED ORDER — HEPARIN (PORCINE) 25000 UT/250ML-% IV SOLN
14.0000 [IU]/kg/h | INTRAVENOUS | Status: DC
  Administered 2020-03-02: 14 [IU]/kg/h via INTRAVENOUS
  Filled 2020-03-02: qty 250

## 2020-03-02 MED ORDER — ONDANSETRON HCL 4 MG PO TABS: 4.0000 mg | ORAL_TABLET | Freq: Four times a day (QID) | ORAL | Status: DC | PRN

## 2020-03-02 MED ORDER — HEPARIN (PORCINE) 25000 UT/250ML-% IV SOLN
2500.0000 [IU]/h | INTRAVENOUS | Status: DC
  Administered 2020-03-02: 1050 [IU]/h via INTRAVENOUS
  Administered 2020-03-02 – 2020-03-03 (×2): 1500 [IU]/h via INTRAVENOUS
  Administered 2020-03-04: 2500 [IU]/h via INTRAVENOUS
  Administered 2020-03-04: 2200 [IU]/h via INTRAVENOUS
  Administered 2020-03-05 (×2): 2500 [IU]/h via INTRAVENOUS
  Filled 2020-03-02 (×6): qty 250

## 2020-03-02 MED ORDER — TEMAZEPAM 15 MG PO CAPS
30.0000 mg | ORAL_CAPSULE | Freq: Every evening | ORAL | Status: DC | PRN
  Administered 2020-03-03 – 2020-03-05 (×3): 30 mg via ORAL
  Filled 2020-03-02 (×3): qty 2

## 2020-03-02 MED ORDER — HEPARIN BOLUS VIA INFUSION
2000.0000 [IU] | Freq: Once | INTRAVENOUS | Status: AC
  Administered 2020-03-02: 2000 [IU] via INTRAVENOUS

## 2020-03-02 MED ORDER — GABAPENTIN 600 MG PO TABS
600.0000 mg | ORAL_TABLET | Freq: Three times a day (TID) | ORAL | Status: DC
  Administered 2020-03-02 – 2020-03-06 (×11): 600 mg via ORAL
  Filled 2020-03-02 (×11): qty 1

## 2020-03-02 MED ORDER — ONDANSETRON HCL 4 MG/2ML IJ SOLN: 4.0000 mg | Freq: Four times a day (QID) | INTRAMUSCULAR | Status: DC | PRN

## 2020-03-02 MED ORDER — ACETAMINOPHEN 325 MG PO TABS: 650.0000 mg | ORAL_TABLET | Freq: Four times a day (QID) | ORAL | Status: DC | PRN

## 2020-03-02 MED ORDER — IOHEXOL 350 MG/ML SOLN
100.0000 mL | Freq: Once | INTRAVENOUS | Status: AC | PRN
  Administered 2020-03-02: 100 mL via INTRAVENOUS

## 2020-03-02 MED ORDER — HYDROXYZINE HCL 25 MG PO TABS
50.0000 mg | ORAL_TABLET | Freq: Once | ORAL | Status: AC
  Administered 2020-03-02: 50 mg via ORAL
  Filled 2020-03-02: qty 2

## 2020-03-02 MED ORDER — LACTATED RINGERS IV SOLN: INTRAVENOUS | Status: DC

## 2020-03-02 MED ORDER — HEPARIN BOLUS VIA INFUSION
2000.0000 [IU] | Freq: Once | INTRAVENOUS | Status: AC
  Administered 2020-03-02: 2000 [IU] via INTRAVENOUS
  Filled 2020-03-02: qty 2000

## 2020-03-02 MED ORDER — ACETAMINOPHEN 650 MG RE SUPP: 650.0000 mg | Freq: Four times a day (QID) | RECTAL | Status: DC | PRN

## 2020-03-02 NOTE — H&P (Signed)
History and Physical    TRAY KLAYMAN XVQ:[id number] DOB: [birth year] DOA: 03/01/2020  PCP: Aurea Graff, PA-C (Inactive)   Patient coming from:   Home via [facility] ER.   Chief Complaint: Swelling and pain in left lower extremity.  HPI: Brian Benton is a 64 y.o. male with medical history significant for history of bladder cancer, HTN, HLD who presented to [facility] ER with complaint of left leg swelling and pain. Pt reports that yesterday am he began to have sudden onset of swelling of left leg that worsened throughout the day and had significant pain. He was not able to ambulate secondary to the swelling and pain in left leg. He reports he has never had spinal like this in the past and he has no history of DVTs or PEs. He states he has not had prolonged immobilization or recent travel. He denies any trauma to his lower extremities. He reports his leg feels heavy but he does have feeling in his left left lower leg and foot. He was started on heparin infusion in the emergency room while awaiting transferred to [facility]. 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 Marin Olp and is scheduled for biopsy of mass next week.  He has not had any cough, shortness of breath, fever, chills, chest pain, palpitations.  Review of Systems:  General: Denies weakness, fever, chills, weight loss, night sweats.  Denies dizziness.  Denies change in appetite HENT: Denies head trauma, headache, denies change in hearing, tinnitus.  Denies nasal congestion or bleeding.  Denies sore throat, sores in mouth.  Denies difficulty swallowing Eyes: Denies blurry vision, pain in eye, drainage.  Denies discoloration of eyes. Neck: Denies pain.  Denies swelling.  Denies pain with movement. Cardiovascular: Denies chest pain, palpitations.  Denies edema.  Denies orthopnea Respiratory: Denies shortness of breath, cough.  Denies wheezing.  Denies sputum production Gastrointestinal: Denies abdominal pain,  swelling.  Denies nausea, vomiting, diarrhea.  Denies melena.  Denies hematemesis. Musculoskeletal: Reports swelling and pain in left lower extremity. Denies limitation of movement.  Denies arthralgias or myalgias. Genitourinary: Denies pelvic pain.  Denies urinary frequency or hesitancy.  Denies dysuria.  Skin: Denies rash.  Denies petechiae, purpura, ecchymosis. Neurological: Denies headache.  Denies syncope.  Denies seizure activity.  Denies weakness or paresthesia.  Denies slurred speech, drooping face.  Denies visual change. Psychiatric: Denies depression, anxiety.  Denies suicidal thoughts or ideation.  Denies hallucinations.  Past Medical History:  Diagnosis Date  . Cancer ([place])    bladder  . History of bladder cancer    s/p  TURBT 07-25-2014  . Hyperlipidemia   . Hypertension   . Mass of left hand     Past Surgical History:  Procedure Laterality Date  . BLADDER SURGERY     [place]  . COLONOSCOPY    . CYSTECTOMY    . CYSTOSCOPY WITH BIOPSY N/A 07/25/2014   Procedure: CYSTOSCOPY WITH BLADDER BIOPSY AND FULGERATION;  Surgeon: [name], MD;  Location: [facility];  Service: Urology;  Laterality: N/A;  . CYSTOSCOPY WITH BIOPSY N/A 11/28/2014   Procedure: CYSTOSCOPY WITH  BLADDER BIOPSY;  Surgeon: [name], MD;  Location: [facility];  Service: Urology;  Laterality: N/A;  . HEMORRHOID SURGERY  03/15/2012   Procedure: HEMORRHOIDECTOMY;  Surgeon: [name], MD;  Location: [facility];  Service: General;  Laterality: N/A;  . HERNIA REPAIR    . INGUINAL HERNIA REPAIR Bilateral 2006  . MASS  EXCISION Left 11/12/2018   Procedure: EXCISION MASS LEFT HAND;  Surgeon: Daryll Brod, MD;  Location: [facility];  Service: Orthopedics;  Laterality: Left;  FAB  . RADIOLOGY WITH ANESTHESIA N/A 02/20/2020   Procedure: MRI LUMBAR W/O CONTRAST  WITH ANESTHESIA;  Surgeon: Radiologist, Medication, MD;  Location: [place];  Service: Radiology;  Laterality: N/A;    . TESTICLE SURGERY  2004   Ruptured Undescended Right testicle   . TRANSURETHRAL RESECTION OF BLADDER TUMOR WITH GYRUS (TURBT-GYRUS) N/A 05/23/2014   Procedure: TRANSURETHRAL RESECTION OF BLADDER TUMOR WITH GYRUS (TURBT-GYRUS);  Surgeon: [name], MD;  Location: [facility];  Service: Urology;  Laterality: N/A;  . WISDOM TOOTH EXTRACTION      Social History  reports that he has been smoking cigarettes. He has a 15.00 pack-year smoking history. He has never used smokeless tobacco. He reports previous alcohol use. He reports that he does not use drugs.  No Known Allergies  Family History  Problem Relation Age of Onset  . Cancer Mother        leukemia  . Diabetes Brother      Prior to Admission medications   Medication Sig Start Date End Date Taking? Authorizing Provider  ASPIRIN ADULT LOW STRENGTH 81 MG EC tablet TAKE 1 TABLET BY MOUTH EVERY DAY Patient taking differently: Take 81 mg by mouth daily.  10/03/19  Yes Patwardhan, Manish J, MD  fentaNYL (DURAGESIC) 50 MCG/HR Place 1 patch onto the skin every other day.    Yes [provider]  gabapentin (NEURONTIN) 600 MG tablet Take 1 tablet (600 mg total) by mouth in the morning, at noon, in the evening, and at bedtime. 02/25/20  Yes Ennever, Rudell Cobb, MD  meloxicam (MOBIC) 15 MG tablet Take 15 mg by mouth daily. 01/21/20  Yes [provider]  oxyCODONE 10 MG TABS Take 1 tablet (10 mg total) by mouth every 4 (four) hours as needed for severe pain. 02/25/20  Yes Ennever, Rudell Cobb, MD  temazepam (RESTORIL) 30 MG capsule Take 1 capsule (30 mg total) by mouth at bedtime as needed for sleep. 02/25/20  Yes Ennever, Rudell Cobb, MD  ZOFRAN 4 MG tablet Take 4 mg by mouth every 8 (eight) hours as needed for nausea/vomiting. 02/18/20  Yes [provider]    Physical Exam: Vitals:   03/02/20 1600 03/02/20 1630 03/02/20 1700 03/02/20 1821  BP: 129/84 122/64 117/72 131/79  Pulse: 78 81 79 79  Resp: 15 19 17 16    Temp:  98.2 F (36.8 C)  98.2 F (36.8 C)  TempSrc:    Oral  SpO2: 93% 94% 94% 93%  Weight:      Height:        Constitutional: NAD, calm, comfortable Vitals:   03/02/20 1600 03/02/20 1630 03/02/20 1700 03/02/20 1821  BP: 129/84 122/64 117/72 131/79  Pulse: 78 81 79 79  Resp: 15 19 17 16   Temp:  98.2 F (36.8 C)  98.2 F (36.8 C)  TempSrc:    Oral  SpO2: 93% 94% 94% 93%  Weight:      Height:       General: WDWN, Alert and oriented x3.  Eyes: EOMI, PERRL, lids and conjunctivae normal.  Sclera nonicteric HENT:  [place]/AT, external ears normal.  Nares patent without epistasis.  Mucous membranes are moist. Posterior pharynx clear of any exudate or lesions. Neck: Soft, normal range of motion, supple, no masses, no thyromegaly.  Trachea midline Respiratory: clear to auscultation  bilaterally, no wheezing, no crackles. Normal respiratory effort. No accessory muscle use.  Cardiovascular: Regular rate and rhythm, no murmurs / rubs / gallops. Has +3 left lower extremity edema. 2+ pedal pulse in right foot, +1 pedal pulse in left foot.  Abdomen: Soft, no tenderness, nondistended, no rebound or guarding.  No masses palpated. No hepatosplenomegaly. Bowel sounds normoactive Musculoskeletal: FROM of upper extremities and right lower extremity. Limit range of motion of left lower extremity. no cyanosis. No joint deformity upper and lower extremities. Normal muscle tone.  Skin: Warm, dry, intact no rashes, lesions, ulcers. No induration Neurologic: CN 2-12 grossly intact.  Normal speech.  Sensation intact, Grip strength 5/5.   Psychiatric: Normal judgment and insight.  Normal mood.    Labs on Admission: I have personally reviewed following labs and imaging studies  CBC: Recent Labs  Lab 02/25/20 1510 03/02/20 0105  WBC 13.7* 14.5*  NEUTROABS 8.2* 10.9*  HGB 11.8* 12.0*  HCT 35.3* 36.7*  MCV 93.6 95.3  PLT 346 993    Basic Metabolic Panel: Recent Labs  Lab 02/25/20 1510  03/02/20 0105  NA 139 135  K 4.1 5.1  CL 104 98  CO2 29 28  GLUCOSE 97 119*  BUN 12 21  CREATININE 0.75 0.94  CALCIUM 9.6 9.3    GFR: Estimated Creatinine Clearance: 84 mL/min (by C-G formula based on SCr of 0.94 mg/dL).  Liver Function Tests: Recent Labs  Lab 02/25/20 1510  AST 148*  ALT 352*  ALKPHOS 421*  BILITOT 0.3  PROT 7.1  ALBUMIN 3.9    Urine analysis: No results found for: COLORURINE, APPEARANCEUR, LABSPEC, PHURINE, GLUCOSEU, HGBUR, BILIRUBINUR, KETONESUR, PROTEINUR, UROBILINOGEN, NITRITE, LEUKOCYTESUR  Radiological Exams on Admission: CT ANGIO AO+BIFEM W & OR WO CONTRAST  Result Date: 03/02/2020 CLINICAL DATA:  Left leg swelling and discoloration, known history of EXAM: CT ANGIOGRAPHY OF ABDOMINAL AORTA WITH ILIOFEMORAL RUNOFF TECHNIQUE: Multidetector CT imaging of the abdomen, pelvis and lower extremities was performed using the standard protocol during bolus administration of intravenous contrast. Multiplanar CT image reconstructions and MIPs were obtained to evaluate the vascular anatomy. CONTRAST:  181mL OMNIPAQUE IOHEXOL 350 MG/ML SOLN COMPARISON:  MRI of the lumbar spine from 02/20/2020, CT from 02/26/2020 FINDINGS: VASCULAR Aorta: Atherosclerotic calcifications are noted without aneurysmal dilatation or dissection. Celiac: Patent without evidence of aneurysm, dissection, vasculitis or significant stenosis. SMA: Patent without evidence of aneurysm, dissection, vasculitis or significant stenosis. Renals: Both renal arteries are patent without evidence of aneurysm, dissection, vasculitis, fibromuscular dysplasia or significant stenosis. IMA: Patent without evidence of aneurysm, dissection, vasculitis or significant stenosis. RIGHT Lower Extremity Iliacs: Atherosclerotic calcifications are noted without aneurysmal dilatation. Runoff: Common femoral and superficial femoral artery are within normal limits. Ostial artery and popliteal trifurcation are widely patent.  Three-vessel runoff to the right ankle is noted. LEFT Lower Extremity Iliacs: Iliac artery is widely patent with mild atherosclerotic calcifications. No focal stenosis is seen. Runoff: Common femoral and superficial femoral arteries are widely patent. Popliteal artery demonstrates mild atherosclerotic calcifications. Popliteal trifurcation is patent with three-vessel runoff to the left foot. Veins: There is soft tissue mass along the course of the left common iliac vein consistent with the known history. The venous system below this mass lesion is dilated when compared with the right-sided venous system. This contributes to the lower extremity swelling. Possibility of underlying thrombus deserves consideration and ultrasound evaluation is recommended. Review of the MIP images confirms the above findings. NON-VASCULAR Lower chest: Bibasilar atelectatic changes are noted. Emphysematous changes  are seen as well. Hepatobiliary: No focal liver abnormality is seen. No gallstones, gallbladder wall thickening, or biliary dilatation. Pancreas: Unremarkable. No pancreatic ductal dilatation or surrounding inflammatory changes. Spleen: Normal in size without focal abnormality. Adrenals/Urinary Tract: Adrenal glands are within normal limits bilaterally. Kidneys demonstrate renal cysts bilaterally similar to that seen on prior CT examination. No obstructive changes are noted. Bladder is well distended. Stomach/Bowel: No obstructive or inflammatory changes of the colon are seen. The appendix is not well visualized although no inflammatory changes to suggest appendicitis are seen. Small bowel and stomach are within normal limits. Lymphatic: No sizable lymphadenopathy is identified. Reproductive: Prostate is unremarkable. Other: Soft tissue mass is again noted along the left psoas muscle and iliacus muscle extending in the iliac fossa similar to that seen on prior MRI examination. This causes some mass effect upon the adjacent iliac  vein with dilatation of the venous system inferiorly. Mild soft tissue edema is noted likely related to the decreased venous outflow. Possibility of underlying deep venous thrombosis is raised. Ultrasound may be helpful for further evaluation. Musculoskeletal: Known mass in the L4-5 L5-S1 region on the left. Some mild bony destruction in the sacrum is seen. The overall appearance is similar to that noted on prior MRI. IMPRESSION: VASCULAR No acute arterial abnormality is noted. Considerable venous dilatation is noted below the level of the known mass lesion in the left hemipelvis likely related to localized compression. The possibility of underlying deep venous thrombosis deserves consideration and ultrasound may be helpful for further evaluation. NON-VASCULAR Left-sided paraspinal mass lesion causing mass effect and decreased outflow in the venous system on the left as described above. No other focal abnormality is noted. Electronically Signed   By: Inez Catalina M.D.   On: 03/02/2020 03:26   US Venous Img Lower Unilateral Left  Result Date: 03/02/2020 CLINICAL DATA:  Sudden onset left lower extremity edema. History bladder cancer and left retroperitoneal metastatic disease. EXAM: LEFT LOWER EXTREMITY VENOUS DOPPLER ULTRASOUND TECHNIQUE: Gray-scale sonography with graded compression, as well as color Doppler and duplex ultrasound were performed to evaluate the lower extremity deep venous systems from the level of the common femoral vein and including the common femoral, femoral, profunda femoral, popliteal and calf veins including the posterior tibial, peroneal and gastrocnemius veins when visible. The superficial great saphenous vein was also interrogated. Spectral Doppler was utilized to evaluate flow at rest and with distal augmentation maneuvers in the common femoral, femoral and popliteal veins. COMPARISON:  None. FINDINGS: Contralateral Common Femoral Vein: Respiratory phasicity is normal and symmetric  with the symptomatic side. No evidence of thrombus. Normal compressibility. Common Femoral Vein: The common femoral vein is not compressible. The lumen is expanded and filled with heterogeneous low level internal echoes. No evidence of color flow on color Doppler imaging. Findings are consistent with acute occlusive DVT. Saphenofemoral Junction: Occlusive DVT extends into the saphenofemoral junction. Profunda Femoral Vein: Occlusive DVT extends into the profunda femoral vein. Femoral Vein: Occlusive DVT extends into the femoral vein and continues throughout the thigh. Popliteal Vein: Occlusive DVT throughout the popliteal vein extending into the calf veins. Calf Veins: Occlusive DVT noted in the peroneal and posterior tibial veins. Superficial Great Saphenous Vein: No evidence of thrombus. Normal compressibility. Venous Reflux:  None. Other Findings: There appears to be occlusive DVT in the external iliac vein as well. IMPRESSION: Positive for extensive acute occlusive DVT throughout the left lower extremity and also likely within the external iliac vein in the pelvis. Electronically Signed  By: Jacqulynn Cadet M.D.   On: 03/02/2020 10:17    Assessment/Plan Principal Problem:   Acute deep vein thrombosis (DVT) of left lower extremity [facility]) Mr. Stanislawski is admitted to medical surgical floor. He has been evaluated by vascular surgery, Dr. Oneida Alar. Continue with heparin infusion. If patient's condition worsens worsening swelling or neurological symptoms patient may need to have TPA. He has been on heparin for over 18 hours and has had no progression of his symptoms and is hopeful that will not need more aggressive treatment. Appreciate neurosurgery's assistance with patient care  Active Problems:   Leg swelling Swelling is secondary to venous outflow obstruction from paraspinal mass and from DVT that has developed in the left lower extremity. Treated with heparin as above.    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    Tobacco abuse Smoking cessation education be provided for discharge     DVT prophylaxis: Patient is on full anticoagulation therapy with heparin infusion Code Status:   Full code Family Communication:  Diagnosed plan discussed with patient and his wife who is at bedside. Patient answered. Patient agrees to plan. Further recommendation follow as clinical indicated Disposition Plan:   Patient is from:  Home via [place] to:  Home  Anticipated DC date:  Anticipate greater than 2 midnight stay to treat acute medical condition  Anticipated DC barriers: Barriers to discharge identified at this time  Consults called:  Vascular surgery, Dr. Oneida Alar Admission status:  Inpatient  Severity of Illness: The appropriate patient status for this patient is INPATIENT. Inpatient status is judged to be reasonable and necessary in order to provide the required intensity of service to ensure the patient's safety. The patient's presenting symptoms, physical exam findings, and initial radiographic and laboratory data in the context of their chronic comorbidities is felt to place them at high risk for further clinical deterioration. Furthermore, it is not anticipated that the patient will be medically stable for discharge from the hospital within 2 midnights of admission. The following factors support the patient status of inpatient.    * I certify that at the point of admission it is my clinical judgment that the patient will require inpatient hospital care spanning beyond 2 midnights from the point of admission due to high intensity of service, high risk for further deterioration and high frequency of surveillance required.Yevonne Aline Jatin Naumann MD [facility]  How to contact the [facility] Attending or Consulting provider [place] or covering provider during after hours  [place], for this patient?   1. Check the care team in [facility] and look for a) attending/consulting [facility] provider listed and b) the [facility] team listed 2. Log into [email or web address] and use [place]'s universal password to access. If you do not have the password, please contact the hospital operator. 3. Locate the [facility] provider you are looking for under [facility] and page to a number that you can be directly reached. 4. If you still have difficulty reaching the provider, please page the [facility] (Director on Call) for the Hospitalists listed on amion for assistance.  03/02/2020, 8:15 PM

## 2020-03-02 NOTE — Consult Note (Signed)
Called by Dr Billy Fischer at [facility] regarding this pt around 1 pm today to ask for advice regarding whether pt may need an intervention for possible phlegmasia of the left leg.  Pt has underlying undiagnosed left retroperitoneal mass and iliofemoral DVT.  I discussed with Dr Vevelyn Pat that first line therapy for this is anticoagulation but the only way to determine if pt needs intervention is to see the pt and not by review of xrays.  At that time pt was awaiting bed at [facility].  I was asked whether pt should come to [facility].  I discussed with her that we only do interventions at [facility] so if she thought the pt may need an intervention he should come to [facility] but that IR at [facility] also does these procedures.  Unfortunately due to bed crunch in the ERs and hospital this pt transfer has been delayed.  She again called me around 4 pm today with similar questions and I again explained that I can only make a determination of the pts clinical situation by seeing him at the bedside.  I discussed with her that if she believes this pt has phlegmasia he is at risk of limb loss but this is a clinical diagnosis and again needs a bedside evaluation.  They will continue to work on transferring pt.  Ruta Hinds, MD [facility] [place] Office: [phone]

## 2020-03-02 NOTE — ED Notes (Signed)
ED Provider at bedside. 

## 2020-03-02 NOTE — Consult Note (Signed)
Referring Physician: Med [facility] Dr. Billy Fischer  Patient name: Brian Benton MRN: [id number] DOB: 02/22/1956 Sex: male  REASON FOR CONSULT: Left leg DVT  HPI: Brian Benton is a 64 y.o. male, with approximately 1 day of acute onset swelling left leg.  He was recently evaluated by Dr. Marin Olp and found to have a left retroperitoneal mass.  Patient states he has never had a prior DVT.  He was awaiting biopsy of this mass next week.  He states the leg is very heavy and he has some pain in the thigh but no real calf or foot pain.  He is able to move the leg but it is difficult to walk because of the heavy sensation.  He was started on heparin at [facility] med center.  He has not complained of hemoptysis or shortness of breath.  Other medical problems include prior bladder cancer hyperlipidemia both of which been previously evaluated and are stable.  Past Medical History:  Diagnosis Date  . Cancer ([place])    bladder  . History of bladder cancer    s/p  TURBT 07-25-2014  . Hyperlipidemia   . Mass of left hand    Past Surgical History:  Procedure Laterality Date  . BLADDER SURGERY     [place]  . COLONOSCOPY    . CYSTECTOMY    . CYSTOSCOPY WITH BIOPSY N/A 07/25/2014   Procedure: CYSTOSCOPY WITH BLADDER BIOPSY AND FULGERATION;  Surgeon: [name], MD;  Location: [facility];  Service: Urology;  Laterality: N/A;  . CYSTOSCOPY WITH BIOPSY N/A 11/28/2014   Procedure: CYSTOSCOPY WITH  BLADDER BIOPSY;  Surgeon: [name], MD;  Location: [facility];  Service: Urology;  Laterality: N/A;  . HEMORRHOID SURGERY  03/15/2012   Procedure: HEMORRHOIDECTOMY;  Surgeon: [name], MD;  Location: [facility];  Service: General;  Laterality: N/A;  . HERNIA REPAIR    . INGUINAL HERNIA REPAIR Bilateral 2006  . MASS EXCISION Left 11/12/2018   Procedure: EXCISION MASS LEFT HAND;  Surgeon: Daryll Brod, MD;  Location: [place];  Service: Orthopedics;   Laterality: Left;  FAB  . RADIOLOGY WITH ANESTHESIA N/A 02/20/2020   Procedure: MRI LUMBAR W/O CONTRAST  WITH ANESTHESIA;  Surgeon: Radiologist, Medication, MD;  Location: [place];  Service: Radiology;  Laterality: N/A;  . TESTICLE SURGERY  2004   Ruptured Undescended Right testicle   . TRANSURETHRAL RESECTION OF BLADDER TUMOR WITH GYRUS (TURBT-GYRUS) N/A 05/23/2014   Procedure: TRANSURETHRAL RESECTION OF BLADDER TUMOR WITH GYRUS (TURBT-GYRUS);  Surgeon: [name], MD;  Location: [facility];  Service: Urology;  Laterality: N/A;  . WISDOM TOOTH EXTRACTION      Family History  Problem Relation Age of Onset  . Cancer Mother        leukemia  . Diabetes Brother     SOCIAL HISTORY: Social History   Socioeconomic History  . Marital status: Married    Spouse name: Not on file  . Number of children: 2  . Years of education: Not on file  . Highest education level: Not on file  Occupational History  . Not on file  Tobacco Use  . Smoking status: Former Smoker    Packs/day: 0.50    Years: 30.00    Pack years: 15.00    Types: Cigarettes    Quit date: 01/01/2019    Years since quitting: 1.1  . Smokeless tobacco: Never Used  Vaping Use  .  Vaping Use: Never used  Substance and Sexual Activity  . Alcohol use: Not Currently    Comment: social  . Drug use: No  . Sexual activity: Not on file  Other Topics Concern  . Not on file  Social History Narrative  . Not on file   Social Determinants of Health   Financial Resource Strain:   . Difficulty of Paying Living Expenses: Not on file  Food Insecurity:   . Worried About Charity fundraiser in the Last Year: Not on file  . Ran Out of Food in the Last Year: Not on file  Transportation Needs:   . Lack of Transportation (Medical): Not on file  . Lack of Transportation (Non-Medical): Not on file  Physical Activity:   . Days of Exercise per Week: Not on file  . Minutes of Exercise per Session: Not on file  Stress:   .  Feeling of Stress : Not on file  Social Connections:   . Frequency of Communication with Friends and Family: Not on file  . Frequency of Social Gatherings with Friends and Family: Not on file  . Attends Religious Services: Not on file  . Active Member of Clubs or Organizations: Not on file  . Attends Archivist Meetings: Not on file  . Marital Status: Not on file  Intimate Partner Violence:   . Fear of Current or Ex-Partner: Not on file  . Emotionally Abused: Not on file  . Physically Abused: Not on file  . Sexually Abused: Not on file    No Known Allergies  Current Facility-Administered Medications  Medication Dose Route Frequency Provider Last Rate Last Admin  . Chlorhexidine Gluconate Cloth 2 % PADS 6 each  6 each Topical Daily Shela Leff, MD      . heparin ADULT infusion 100 units/mL (25000 units/234mL sodium chloride 0.45%)  1,300 Units/hr Intravenous Continuous Shela Leff, MD 13 mL/hr at 03/02/20 1448 1,300 Units/hr at 03/02/20 1448    ROS:   General:  No weight loss, Fever, chills  HEENT: No recent headaches, no nasal bleeding, no visual changes, no sore throat  Neurologic: No dizziness, blackouts, seizures. No recent symptoms of stroke or mini- stroke. No recent episodes of slurred speech, or temporary blindness.  Cardiac: No recent episodes of chest pain/pressure, no shortness of breath at rest.  No shortness of breath with exertion.  Denies history of atrial fibrillation or irregular heartbeat  Vascular: No history of rest pain in feet.  No history of claudication.  No history of non-healing ulcer, No history of DVT   Pulmonary: No home oxygen, no productive cough, no hemoptysis,  No asthma or wheezing  Musculoskeletal:  [ ]  Arthritis, [ ]  Low back pain,  [ ]  Joint pain  Hematologic:No history of hypercoagulable state.  No history of easy bleeding.  No history of anemia  Gastrointestinal: No hematochezia or melena,  No gastroesophageal  reflux, no trouble swallowing  Urinary: [ ]  chronic Kidney disease, [ ]  on HD - [ ]  MWF or [ ]  TTHS, [ ]  Burning with urination, [ ]  Frequent urination, [ ]  Difficulty urinating;   Skin: No rashes  Psychological: No history of anxiety,  No history of depression   Physical Examination  Vitals:   03/02/20 1600 03/02/20 1630 03/02/20 1700 03/02/20 1821  BP: 129/84 122/64 117/72 131/79  Pulse: 78 81 79 79  Resp: 15 19 17 16   Temp:  98.2 F (36.8 C)  98.2 F (36.8 C)  TempSrc:  Oral  SpO2: 93% 94% 94% 93%  Weight:      Height:        Body mass index is 22.65 kg/m.  General:  Alert and oriented, no acute distress HEENT: Normal Neck: No JVD Pulmonary: Clear to auscultation bilaterally Cardiac: Regular Rate and Rhythm Abdomen: Soft, non-tender, non-distended Skin: No rash, left leg has a slightly bluish hue compared to the right leg left foot is slightly cooler than the right but temperature similar from the ankle up to the thigh Extremity Pulses:  2+ femoral, 1+ left 2+ right dorsalis pedis Musculoskeletal: Left leg extending from the thigh into the foot is approximately 2-2-1/2 times the diameter of the right leg compartments in the left calf are soft and nontender.  He has some edema in the foot.  He has mild tenderness on palpation in the left thigh.  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  CT angiogram of the abdomen and pelvis performed at [facility] shows similar findings with engorgement of the venous system suggestive of obstruction.  DVT ultrasound performed today  shows extensive DVT throughout the left lower extremity and involving the left external iliac vein  ASSESSMENT: Left leg DVT involving the iliac vein most likely secondary to tumor encroachment or inflammatory response.  The patient currently does not have a phlegmatic leg.  His compartments are soft.  Motor and sensory function is essentially intact.   PLAN: Continue IV heparin for now.  If patient begins to develop severe pain in the leg worsening swelling of the leg or change in neurologic status would consider thrombolysis and possible fasciotomy.  However, he is now approximately 15 hours on heparin and has not had any decline of status so I believe most likely anticoagulation should be adequate.  Findings discussed with the patient and his wife as for as the occlusion of the iliac vein and the treatment plan.  Discussed with him signs and symptoms of worsening progression of disease.  We will keep patient on clear liquids this evening if he has had improvement or no change in his status by tomorrow morning most likely can advance diet.   Ruta Hinds, MD [facility] [place] Office: [phone]

## 2020-03-02 NOTE — H&P (View-Only) (Signed)
Referring Physician: Med [facility] Dr. Billy Fischer  Patient name: Brian Benton MRN: [id number] DOB: 07-Sep-1955 Sex: male  REASON FOR CONSULT: Left leg DVT  HPI: Brian Benton is a 64 y.o. male, with approximately 1 day of acute onset swelling left leg.  He was recently evaluated by Dr. Marin Olp and found to have a left retroperitoneal mass.  Patient states he has never had a prior DVT.  He was awaiting biopsy of this mass next week.  He states the leg is very heavy and he has some pain in the thigh but no real calf or foot pain.  He is able to move the leg but it is difficult to walk because of the heavy sensation.  He was started on heparin at [facility] med center.  He has not complained of hemoptysis or shortness of breath.  Other medical problems include prior bladder cancer hyperlipidemia both of which been previously evaluated and are stable.  Past Medical History:  Diagnosis Date  . Cancer ([place])    bladder  . History of bladder cancer    s/p  TURBT 07-25-2014  . Hyperlipidemia   . Mass of left hand    Past Surgical History:  Procedure Laterality Date  . BLADDER SURGERY     [place]  . COLONOSCOPY    . CYSTECTOMY    . CYSTOSCOPY WITH BIOPSY N/A 07/25/2014   Procedure: CYSTOSCOPY WITH BLADDER BIOPSY AND FULGERATION;  Surgeon: [name], MD;  Location: [facility];  Service: Urology;  Laterality: N/A;  . CYSTOSCOPY WITH BIOPSY N/A 11/28/2014   Procedure: CYSTOSCOPY WITH  BLADDER BIOPSY;  Surgeon: [name], MD;  Location: [facility];  Service: Urology;  Laterality: N/A;  . HEMORRHOID SURGERY  03/15/2012   Procedure: HEMORRHOIDECTOMY;  Surgeon: [name], MD;  [facility];  Service: General;  Laterality: N/A;  . HERNIA REPAIR    . INGUINAL HERNIA REPAIR Bilateral 2006  . MASS EXCISION Left 11/12/2018   Procedure: EXCISION MASS LEFT HAND;  Surgeon: Daryll Brod, MD;  Location: [place];  Service: Orthopedics;   Laterality: Left;  FAB  . RADIOLOGY WITH ANESTHESIA N/A 02/20/2020   Procedure: MRI LUMBAR W/O CONTRAST  WITH ANESTHESIA;  Surgeon: Radiologist, Medication, MD;  Location: [place];  Service: Radiology;  Laterality: N/A;  . TESTICLE SURGERY  2004   Ruptured Undescended Right testicle   . TRANSURETHRAL RESECTION OF BLADDER TUMOR WITH GYRUS (TURBT-GYRUS) N/A 05/23/2014   Procedure: TRANSURETHRAL RESECTION OF BLADDER TUMOR WITH GYRUS (TURBT-GYRUS);  Surgeon: [name], MD;  Location: [facility];  Service: Urology;  Laterality: N/A;  . WISDOM TOOTH EXTRACTION      Family History  Problem Relation Age of Onset  . Cancer Mother        leukemia  . Diabetes Brother     SOCIAL HISTORY: Social History   Socioeconomic History  . Marital status: Married    Spouse name: Not on file  . Number of children: 2  . Years of education: Not on file  . Highest education level: Not on file  Occupational History  . Not on file  Tobacco Use  . Smoking status: Former Smoker    Packs/day: 0.50    Years: 30.00    Pack years: 15.00    Types: Cigarettes    Quit date: 01/01/2019    Years since quitting: 1.1  . Smokeless tobacco: Never Used  Vaping Use  .  Vaping Use: Never used  Substance and Sexual Activity  . Alcohol use: Not Currently    Comment: social  . Drug use: No  . Sexual activity: Not on file  Other Topics Concern  . Not on file  Social History Narrative  . Not on file   Social Determinants of Health   Financial Resource Strain:   . Difficulty of Paying Living Expenses: Not on file  Food Insecurity:   . Worried About Charity fundraiser in the Last Year: Not on file  . Ran Out of Food in the Last Year: Not on file  Transportation Needs:   . Lack of Transportation (Medical): Not on file  . Lack of Transportation (Non-Medical): Not on file  Physical Activity:   . Days of Exercise per Week: Not on file  . Minutes of Exercise per Session: Not on file  Stress:   .  Feeling of Stress : Not on file  Social Connections:   . Frequency of Communication with Friends and Family: Not on file  . Frequency of Social Gatherings with Friends and Family: Not on file  . Attends Religious Services: Not on file  . Active Member of Clubs or Organizations: Not on file  . Attends Archivist Meetings: Not on file  . Marital Status: Not on file  Intimate Partner Violence:   . Fear of Current or Ex-Partner: Not on file  . Emotionally Abused: Not on file  . Physically Abused: Not on file  . Sexually Abused: Not on file    No Known Allergies  Current Facility-Administered Medications  Medication Dose Route Frequency Provider Last Rate Last Admin  . Chlorhexidine Gluconate Cloth 2 % PADS 6 each  6 each Topical Daily Shela Leff, MD      . heparin ADULT infusion 100 units/mL (25000 units/246mL sodium chloride 0.45%)  1,300 Units/hr Intravenous Continuous Shela Leff, MD 13 mL/hr at 03/02/20 1448 1,300 Units/hr at 03/02/20 1448    ROS:   General:  No weight loss, Fever, chills  HEENT: No recent headaches, no nasal bleeding, no visual changes, no sore throat  Neurologic: No dizziness, blackouts, seizures. No recent symptoms of stroke or mini- stroke. No recent episodes of slurred speech, or temporary blindness.  Cardiac: No recent episodes of chest pain/pressure, no shortness of breath at rest.  No shortness of breath with exertion.  Denies history of atrial fibrillation or irregular heartbeat  Vascular: No history of rest pain in feet.  No history of claudication.  No history of non-healing ulcer, No history of DVT   Pulmonary: No home oxygen, no productive cough, no hemoptysis,  No asthma or wheezing  Musculoskeletal:  [ ]  Arthritis, [ ]  Low back pain,  [ ]  Joint pain  Hematologic:No history of hypercoagulable state.  No history of easy bleeding.  No history of anemia  Gastrointestinal: No hematochezia or melena,  No gastroesophageal  reflux, no trouble swallowing  Urinary: [ ]  chronic Kidney disease, [ ]  on HD - [ ]  MWF or [ ]  TTHS, [ ]  Burning with urination, [ ]  Frequent urination, [ ]  Difficulty urinating;   Skin: No rashes  Psychological: No history of anxiety,  No history of depression   Physical Examination  Vitals:   03/02/20 1600 03/02/20 1630 03/02/20 1700 03/02/20 1821  BP: 129/84 122/64 117/72 131/79  Pulse: 78 81 79 79  Resp: 15 19 17 16   Temp:  98.2 F (36.8 C)  98.2 F (36.8 C)  TempSrc:  Oral  SpO2: 93% 94% 94% 93%  Weight:      Height:        Body mass index is 22.65 kg/m.  General:  Alert and oriented, no acute distress HEENT: Normal Neck: No JVD Pulmonary: Clear to auscultation bilaterally Cardiac: Regular Rate and Rhythm Abdomen: Soft, non-tender, non-distended Skin: No rash, left leg has a slightly bluish hue compared to the right leg left foot is slightly cooler than the right but temperature similar from the ankle up to the thigh Extremity Pulses:  2+ femoral, 1+ left 2+ right dorsalis pedis Musculoskeletal: Left leg extending from the thigh into the foot is approximately 2-2-1/2 times the diameter of the right leg compartments in the left calf are soft and nontender.  He has some edema in the foot.  He has mild tenderness on palpation in the left thigh.  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  CT angiogram of the abdomen and pelvis performed at [facility] shows similar findings with engorgement of the venous system suggestive of obstruction.  DVT ultrasound performed today  shows extensive DVT throughout the left lower extremity and involving the left external iliac vein  ASSESSMENT: Left leg DVT involving the iliac vein most likely secondary to tumor encroachment or inflammatory response.  The patient currently does not have a phlegmatic leg.  His compartments are soft.  Motor and sensory function is essentially intact.   PLAN: Continue IV heparin for now.  If patient begins to develop severe pain in the leg worsening swelling of the leg or change in neurologic status would consider thrombolysis and possible fasciotomy.  However, he is now approximately 15 hours on heparin and has not had any decline of status so I believe most likely anticoagulation should be adequate.  Findings discussed with the patient and his wife as for as the occlusion of the iliac vein and the treatment plan.  Discussed with him signs and symptoms of worsening progression of disease.  We will keep patient on clear liquids this evening if he has had improvement or no change in his status by tomorrow morning most likely can advance diet.   Ruta Hinds, MD [facility] [place] Office: [phone]

## 2020-03-02 NOTE — ED Provider Notes (Signed)
[facility] EMERGENCY DEPARTMENT Provider Note   CSN: [id number] Arrival date & time: 03/01/20  2243     History Chief Complaint  Patient presents with  . Leg Swelling    Brian Benton is a 64 y.o. male.  HPI     This is a 64 year old male with history of hyperlipidemia, hypertension, recent diagnosis of a paraspinal/retroperitoneal tumor who presents with left leg pain and swelling.  Patient states he noted left leg swelling yesterday.  He has been keeping it elevated.  He had acute worsening swelling, his coloration and numbness of the left foot yesterday evening prior to evaluation.  Daughter at the bedside reports that she was unable to feel a pulse at that time.  Patient states that his numbness has resolved since presentation.  He now feels normal sensation in the foot.  No history of blood clot.  Patient does state that he had some back discomfort and has radiation at baseline of his pain down the left leg but this was different.  No fevers or recent sick contacts.  No known Covid exposures.  He has been vaccinated against COVID-19.  Chart reviewed.  Had a surveillance CT scan of the chest abdomen pelvis on Thursday.  Additionally MRI reviewed showing retroperitoneal tumor.  Daughter reports he is due for biopsy on Thursday.  Past Medical History:  Diagnosis Date  . Cancer ([place])    bladder  . History of bladder cancer    s/p  TURBT 07-25-2014  . Hyperlipidemia   . Mass of left hand     Patient Active Problem List   Diagnosis Date Noted  . Essential hypertension 10/31/2018  . 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 Mixed hyperlipidemia 10/03/2018  . NEVUS, ATYPICAL 10/23/2007  . Tobacco abuse 02/21/2007    Past Surgical History:  Procedure Laterality Date  . BLADDER SURGERY     [place]  . COLONOSCOPY    . CYSTECTOMY    . CYSTOSCOPY WITH BIOPSY N/A 07/25/2014   Procedure: CYSTOSCOPY WITH BLADDER BIOPSY AND FULGERATION;  Surgeon: [name], MD;  Location: [facility];  Service: Urology;  Laterality: N/A;  . CYSTOSCOPY WITH BIOPSY N/A 11/28/2014   Procedure: CYSTOSCOPY WITH  BLADDER BIOPSY;  Surgeon: [name], MD;  Location: [facility];  Service: Urology;  Laterality: N/A;  . HEMORRHOID SURGERY  03/15/2012   Procedure: HEMORRHOIDECTOMY;  Surgeon: [name], MD;  Location: [facility];  Service: General;  Laterality: N/A;  . HERNIA REPAIR    . INGUINAL HERNIA REPAIR Bilateral 2006  . MASS EXCISION Left 11/12/2018   Procedure: EXCISION MASS LEFT HAND;  Surgeon: Daryll Brod, MD;  Location: [place];  Service: Orthopedics;  Laterality: Left;  FAB  . RADIOLOGY WITH ANESTHESIA N/A 02/20/2020   Procedure: MRI LUMBAR W/O CONTRAST  WITH ANESTHESIA;  Surgeon: Radiologist, Medication, MD;  Location: [place];  Service: Radiology;  Laterality: N/A;  . TESTICLE SURGERY  2004   Ruptured Undescended Right testicle   . TRANSURETHRAL RESECTION OF BLADDER TUMOR WITH GYRUS (TURBT-GYRUS) N/A 05/23/2014   Procedure: TRANSURETHRAL RESECTION OF BLADDER TUMOR WITH GYRUS (TURBT-GYRUS);  Surgeon: [name], MD;  Location: [facility];  Service: Urology;  Laterality: N/A;  . WISDOM TOOTH EXTRACTION         Family History  Problem Relation Age of Onset  . Cancer Mother        leukemia  . Diabetes Brother  Social History   Tobacco Use  . Smoking status: Former Smoker    Packs/day: 0.50    Years: 30.00    Pack years: 15.00    Types: Cigarettes    Quit date: 01/01/2019    Years since quitting: 1.1  . Smokeless tobacco: Never Used  Vaping Use  . Vaping Use: Never used  Substance Use Topics  . Alcohol use: Not Currently    Comment: social  . Drug use: No    Home Medications Prior to Admission medications   Medication Sig Start Date End Date Taking? Authorizing Provider  fentaNYL (DURAGESIC) 50 MCG/HR Place 1 patch onto the skin every 3 (three) days.   Yes  [provider]  ASPIRIN ADULT LOW STRENGTH 81 MG EC tablet TAKE 1 TABLET BY MOUTH EVERY DAY Patient taking differently: Take 81 mg by mouth daily.  10/03/19   Patwardhan, Reynold Bowen, MD  brimonidine (ALPHAGAN) 0.2 % ophthalmic solution Place 1 drop into the left eye 2 (two) times daily. Patient not taking: Reported on 02/12/2020 10/26/18   Bernarda Caffey, MD  calcitonin, salmon, (MIACALCIN/FORTICAL) 200 UNIT/ACT nasal spray Place 1 spray into alternate nostrils daily. 01/30/20   [provider]  Calcium Carbonate-Vit D-Min (CALTRATE 600+D PLUS MINERALS PO) Take 1 tablet by mouth daily.    [provider]  Cholecalciferol (VITAMIN D) 50 MCG (2000 UT) tablet Take 2,000 Units by mouth daily.    [provider]  fentaNYL (DURAGESIC) 25 MCG/HR SMARTSIG:Patch(s) Topical Every 3 Days 02/20/20   [provider]  gabapentin (NEURONTIN) 600 MG tablet Take 1 tablet (600 mg total) by mouth in the morning, at noon, in the evening, and at bedtime. 02/25/20   Volanda Napoleon, MD  meloxicam (MOBIC) 15 MG tablet Take 15 mg by mouth daily. 01/21/20   [provider]  MULTIPLE MINERALS PO Take 2 tablets by mouth daily.     [provider]  oxyCODONE 10 MG TABS Take 1 tablet (10 mg total) by mouth every 4 (four) hours as needed for severe pain. 02/25/20   Volanda Napoleon, MD  temazepam (RESTORIL) 30 MG capsule Take 1 capsule (30 mg total) by mouth at bedtime as needed for sleep. 02/25/20   Volanda Napoleon, MD    Allergies    Patient has no known allergies.  Review of Systems   Review of Systems  Constitutional: Negative for fever.  Respiratory: Negative for shortness of breath.   Cardiovascular: Positive for leg swelling. Negative for chest pain.  Gastrointestinal: Negative for abdominal pain, nausea and vomiting.  Genitourinary: Negative for dysuria.  Musculoskeletal: Positive for back pain.  Neurological: Positive for numbness.  All other systems  reviewed and are negative.   Physical Exam Updated Vital Signs BP 134/79   Pulse 88   Temp 100 F (37.8 C) (Oral)   Resp (!) 21   Ht 1.829 m (6')   Wt 75.8 kg   SpO2 92%   BMI 22.65 kg/m   Physical Exam Vitals and nursing note reviewed.  Constitutional:      Appearance: He is well-developed.     Comments: Chronically ill-appearing but nontoxic  HENT:     Head: Normocephalic and atraumatic.     Mouth/Throat:     Mouth: Mucous membranes are moist.  Eyes:     Pupils: Pupils are equal, round, and reactive to light.  Cardiovascular:     Rate and Rhythm: Normal rate and regular rhythm.  Pulmonary:  Effort: Pulmonary effort is normal. No respiratory distress.  Abdominal:     Palpations: Abdomen is soft.     Tenderness: There is no abdominal tenderness.  Musculoskeletal:     Cervical back: Neck supple.     Comments: Obvious asymmetric swelling left greater than right of the lower extremities, there is diffuse erythema extending from the proximal leg down to the foot that is blanching in nature, 2+ DP pulse noted bilaterally, no pain with palpation  Skin:    General: Skin is warm and dry.  Neurological:     Mental Status: He is alert and oriented to person, place, and time.     Sensory: Sensory deficit:    Psychiatric:        Mood and Affect: Mood normal.     ED Results / Procedures / Treatments   Labs (all labs ordered are listed, but only abnormal results are displayed) Labs Reviewed  CBC WITH DIFFERENTIAL/PLATELET - Abnormal; Notable for the following components:      Result Value   WBC 14.5 (*)    RBC 3.85 (*)    Hemoglobin 12.0 (*)    HCT 36.7 (*)    RDW 15.9 (*)    Neutro Abs 10.9 (*)    Monocytes Absolute 1.1 (*)    Eosinophils Absolute 1.1 (*)    Abs Immature Granulocytes 0.10 (*)    All other components within normal limits  BASIC METABOLIC PANEL - Abnormal; Notable for the following components:   Glucose, Bld 119 (*)    All other components within  normal limits  SARS CORONAVIRUS 2 BY RT PCR (HOSPITAL ORDER, [facility])  PROTIME-INR    EKG None  Radiology CT ANGIO AO+BIFEM W & OR WO CONTRAST  Result Date: 03/02/2020 CLINICAL DATA:  Left leg swelling and discoloration, known history of EXAM: CT ANGIOGRAPHY OF ABDOMINAL AORTA WITH ILIOFEMORAL RUNOFF TECHNIQUE: Multidetector CT imaging of the abdomen, pelvis and lower extremities was performed using the standard protocol during bolus administration of intravenous contrast. Multiplanar CT image reconstructions and MIPs were obtained to evaluate the vascular anatomy. CONTRAST:  175mL OMNIPAQUE IOHEXOL 350 MG/ML SOLN COMPARISON:  MRI of the lumbar spine from 02/20/2020, CT from 02/26/2020 FINDINGS: VASCULAR Aorta: Atherosclerotic calcifications are noted without aneurysmal dilatation or dissection. Celiac: Patent without evidence of aneurysm, dissection, vasculitis or significant stenosis. SMA: Patent without evidence of aneurysm, dissection, vasculitis or significant stenosis. Renals: Both renal arteries are patent without evidence of aneurysm, dissection, vasculitis, fibromuscular dysplasia or significant stenosis. IMA: Patent without evidence of aneurysm, dissection, vasculitis or significant stenosis. RIGHT Lower Extremity Iliacs: Atherosclerotic calcifications are noted without aneurysmal dilatation. Runoff: Common femoral and superficial femoral artery are within normal limits. Ostial artery and popliteal trifurcation are widely patent. Three-vessel runoff to the right ankle is noted. LEFT Lower Extremity Iliacs: Iliac artery is widely patent with mild atherosclerotic calcifications. No focal stenosis is seen. Runoff: Common femoral and superficial femoral arteries are widely patent. Popliteal artery demonstrates mild atherosclerotic calcifications. Popliteal trifurcation is patent with three-vessel runoff to the left foot. Veins: There is soft tissue mass along the course  of the left common iliac vein consistent with the known history. The venous system below this mass lesion is dilated when compared with the right-sided venous system. This contributes to the lower extremity swelling. Possibility of underlying thrombus deserves consideration and ultrasound evaluation is recommended. Review of the MIP images confirms the above findings. NON-VASCULAR Lower chest: Bibasilar atelectatic changes are noted.  Emphysematous changes are seen as well. Hepatobiliary: No focal liver abnormality is seen. No gallstones, gallbladder wall thickening, or biliary dilatation. Pancreas: Unremarkable. No pancreatic ductal dilatation or surrounding inflammatory changes. Spleen: Normal in size without focal abnormality. Adrenals/Urinary Tract: Adrenal glands are within normal limits bilaterally. Kidneys demonstrate renal cysts bilaterally similar to that seen on prior CT examination. No obstructive changes are noted. Bladder is well distended. Stomach/Bowel: No obstructive or inflammatory changes of the colon are seen. The appendix is not well visualized although no inflammatory changes to suggest appendicitis are seen. Small bowel and stomach are within normal limits. Lymphatic: No sizable lymphadenopathy is identified. Reproductive: Prostate is unremarkable. Other: Soft tissue mass is again noted along the left psoas muscle and iliacus muscle extending in the iliac fossa similar to that seen on prior MRI examination. This causes some mass effect upon the adjacent iliac vein with dilatation of the venous system inferiorly. Mild soft tissue edema is noted likely related to the decreased venous outflow. Possibility of underlying deep venous thrombosis is raised. Ultrasound may be helpful for further evaluation. Musculoskeletal: Known mass in the L4-5 L5-S1 region on the left. Some mild bony destruction in the sacrum is seen. The overall appearance is similar to that noted on prior MRI. IMPRESSION: VASCULAR  No acute arterial abnormality is noted. Considerable venous dilatation is noted below the level of the known mass lesion in the left hemipelvis likely related to localized compression. The possibility of underlying deep venous thrombosis deserves consideration and ultrasound may be helpful for further evaluation. NON-VASCULAR Left-sided paraspinal mass lesion causing mass effect and decreased outflow in the venous system on the left as described above. No other focal abnormality is noted. Electronically Signed   By: Inez Catalina M.D.   On: 03/02/2020 03:26    Procedures Procedures (including critical care time)  Medications Ordered in ED Medications  heparin ADULT infusion 100 units/mL (25000 units/243mL sodium chloride 0.45%) (has no administration in time range)  iohexol (OMNIPAQUE) 350 MG/ML injection 100 mL (100 mLs Intravenous Contrast Given 03/02/20 0250)    ED Course  I have reviewed the triage vital signs and the nursing notes.  Pertinent labs & imaging results that were available during my care of the patient were reviewed by me and considered in my medical decision making (see chart for details).    MDM Rules/Calculators/A&P                           Patient presents with isolated left leg swelling.  He had a self-limited episode of numbness of the foot and daughter reported decreased perfusion and pulses.  He is overall nontoxic and vital signs are notable for pulse ox of 91% and temperature of 100.  On my clinical exam, he has a good palpable DP pulse.  Given transient numbness and reported decreased pulses, question arterial embolism that has resolved.  Clinically, his exam is more consistent with a venous obstruction.  Consider DVT versus mass obstruction.  Do not have access to ultrasound at this time.  Will obtain a CTA to rule out ongoing arterial compromise.  Lab work obtained without significant metabolic derangement.  Covid testing is negative.  CT shows no evidence of  ongoing arterial compromise.  However, there is significant venous engorgement and question obstruction versus DVT.  Heparin was started until I am able to get an ultrasound.  This has been ordered.  If this ultrasound is negative, he may need  more expedited evaluation for his known tumor for resection given compromise to the left lower extremity.  Discussed with the hospitalist who is agreeable to plan.  Final Clinical Impression(s) / ED Diagnoses Final diagnoses:  Left leg swelling    Rx / DC Orders ED Discharge Orders    None       Leahanna Buser, Barbette Hair, MD 03/02/20 [phone]

## 2020-03-02 NOTE — ED Notes (Signed)
Report given to [facility]. 20 mins out

## 2020-03-02 NOTE — Progress Notes (Signed)
ANTICOAGULATION CONSULT NOTE - Follow Up Consult  Pharmacy Consult for Heparin Indication: DVT  No Known Allergies  Patient Measurements: Height: 6' (182.9 cm) Weight: 75.8 kg (167 lb) IBW/kg (Calculated) : 77.6 Heparin Dosing Weight: 75  Vital Signs: Temp: 98.5 F (36.9 C) (09/20 1227) Temp Source: Oral (09/20 1227) BP: 131/76 (09/20 1400) Pulse Rate: 81 (09/20 1400)  Labs: Recent Labs    03/02/20 0105 03/02/20 1245  HGB 12.0*  --   HCT 36.7*  --   PLT 327  --   LABPROT 12.7  --   INR 1.0  --   HEPARINUNFRC  --  0.17*  CREATININE 0.94  --     Estimated Creatinine Clearance: 84 mL/min (by C-G formula based on SCr of 0.94 mg/dL).   Medications:  Scheduled:  . heparin  2,000 Units Intravenous Once    Assessment: Heparin level back at 1.61 No therapy complications noted  Goal of Therapy:  Heparin level 0.3-0.7 units/ml Monitor platelets by anticoagulation protocol: Yes   Plan:  Give 2000 units bolus x 1  Increase infusion to 1300 units per hour Evaluate 6 hr heparin level  Dorrien Grunder A Sharlene Mccluskey 03/02/2020,2:39 PM

## 2020-03-02 NOTE — ED Provider Notes (Signed)
  Physical Exam  BP 140/77   Pulse 84   Temp 98.5 F (36.9 C) (Oral)   Resp 20   Ht 6' (1.829 m)   Wt 75.8 kg   SpO2 94%   BMI 22.65 kg/m   Physical Exam  ED Course/Procedures     Procedures  MDM    At this time, Mr Frisch is admitted to the hospitalist service on a heparin drip awaiting bed placement and [place] ordered for confirmation of DVT.   Please see Dr. Eliezer Bottom note for prior history, physical and care. 64 year old male with history hyperlipidemia, hypertension, recent diagnosis of paraspinal/retroperitoneal tumor who presented with left leg pain and swelling.  US shows extensive DVT.  Reevaluated the patient who does have color changes to leg but continues to have palpable DP pulse.  Called Dr. Oneida Alar of Vascular Surgery who stated they could not make recommendations without seeing the patient, however the treatment for DVT is heparin as provided and he did not suspect he would be taken for immediate procedure.  Discussed with bed control and patient is expected to be getting a bed at [facility] in the next few hours. Given current ED environment feel transfer in a few hours to bed with vascular evaluation is appropriate.  He is still waiting for ED transfer. Discussed again with Dr. Oneida Alar who recommends that if we feel he needs more emergent evaluation they will see him.  Will transfer to [facility] ED for Vascular evaluation with concern for possible phlegmasia cerulea dolens as he continues to await his bed at [facility] with hospitalist.  Dr. Gilford Raid accepting.         Gareth Morgan, MD 03/02/20 1640

## 2020-03-02 NOTE — Plan of Care (Signed)
  Problem: Education: Goal: Knowledge of General Education information will improve Description Including pain rating scale, medication(s)/side effects and non-pharmacologic comfort measures Outcome: Progressing   

## 2020-03-02 NOTE — ED Notes (Signed)
Pt placed on 2L [place]. Pt has hx of sleep apnea and saturation already 90-92% prior to administration of Morphine.

## 2020-03-02 NOTE — Progress Notes (Signed)
ANTICOAGULATION CONSULT NOTE - Follow Up Consult  Pharmacy Consult for Heparin Indication: DVT  No Known Allergies  Patient Measurements: Height: 6' (182.9 cm) Weight: 75.8 kg (167 lb) IBW/kg (Calculated) : 77.6 Heparin Dosing Weight: 75  Vital Signs: Temp: 98.3 F (36.8 C) (09/20 2136) Temp Source: Oral (09/20 2136) BP: 127/73 (09/20 2136) Pulse Rate: 76 (09/20 2136)  Labs: Recent Labs    03/02/20 0105 03/02/20 1245 03/02/20 2143  HGB 12.0*  --   --   HCT 36.7*  --   --   PLT 327  --   --   LABPROT 12.7  --   --   INR 1.0  --   --   HEPARINUNFRC  --  0.17* 0.12*  CREATININE 0.94  --   --     Estimated Creatinine Clearance: 84 mL/min (by C-G formula based on SCr of 0.94 mg/dL).   Medications:  Scheduled:  . aspirin EC  81 mg Oral Daily  . Chlorhexidine Gluconate Cloth  6 each Topical Daily  . fentaNYL  1 patch Transdermal Q48H  . gabapentin  600 mg Oral TID    Assessment: 70 YOM with LLE swelling and found to have LLE DVT. Pharmacy consulted for heparin dosing.   Heparin level this evening remains SUBtherapeutic (HL 0.12 << 0.17, goal of 0.3-0.7) despite a rate increase earlier today. RN stated no interruptions reported to him or during his shift, double checked IV site and infusing appropriately. No bleeding noted.   Goal of Therapy:  Heparin level 0.3-0.7 units/ml Monitor platelets by anticoagulation protocol: Yes   Plan:  - Repeat Heparin 2000 unit bolus x 1 - Increase infusing to 1500 units/hr (15 ml/hr) - Will continue to monitor for any signs/symptoms of bleeding and will follow up with heparin level in 6 hours   Thank you for allowing pharmacy to be a part of this patient's care.  Alycia Rossetti, PharmD, BCPS Clinical Pharmacist Clinical phone for 03/02/2020: [phone] 03/02/2020 10:50 PM   **Pharmacist phone directory can now be found on [email or web address] ([email or web address]).  Listed under [place].

## 2020-03-02 NOTE — Progress Notes (Signed)
ANTICOAGULATION CONSULT NOTE - Initial Consult  Pharmacy Consult for heparin Indication: DVT  No Known Allergies  Patient Measurements: Height: 6' (182.9 cm) Weight: 75.8 kg (167 lb) IBW/kg (Calculated) : 77.6 Heparin Dosing Weight: 75.8kg  Vital Signs: Temp: 99.2 F (37.3 C) (09/20 0712) Temp Source: Oral (09/20 0712) BP: 128/72 (09/20 0730) Pulse Rate: 83 (09/20 0730)  Labs: Recent Labs    03/02/20 0105  HGB 12.0*  HCT 36.7*  PLT 327  LABPROT 12.7  INR 1.0  CREATININE 0.94    Estimated Creatinine Clearance: 84 mL/min (by C-G formula based on SCr of 0.94 mg/dL).   Medical History: Past Medical History:  Diagnosis Date  . Cancer ([place])    bladder  . History of bladder cancer    s/p  TURBT 07-25-2014  . Hyperlipidemia   . Mass of left hand    Assessment: Brian Benton presenting with leg pain/swelling, started on heparin gtt per MD at [facility] d/t doppler being unavailable, now doppler positive for DVT and pharmacy consulted to manage heparin gtt.  Heparin gtt was started ~0530 at 1050 units/hr, no bolus was administered.  H/H stable, plts wnl.  Pt is not on any anticoagulation PTA.    Goal of Therapy:  Heparin level 0.3-0.7 units/ml Monitor platelets by anticoagulation protocol: Yes   Plan:  Continue heparin gtt at 1050 units/hr Will get a heparin level @1200  (~ 6h) F/u long term AC plan and ability to transition to PO  Bertis Ruddy, PharmD Clinical Pharmacist ED Pharmacist Phone # [phone] 03/02/2020 10:34 AM

## 2020-03-03 ENCOUNTER — Inpatient Hospital Stay (HOSPITAL_COMMUNITY): Payer: 59

## 2020-03-03 ENCOUNTER — Encounter (HOSPITAL_COMMUNITY): Payer: Self-pay | Admitting: Family Medicine

## 2020-03-03 DIAGNOSIS — I82412 Acute embolism and thrombosis of left femoral vein: Secondary | ICD-10-CM

## 2020-03-03 LAB — CBC
HCT: 36.1 % — ABNORMAL LOW (ref 39.0–52.0)
Hemoglobin: 12 g/dL — ABNORMAL LOW (ref 13.0–17.0)
MCH: 31 pg (ref 26.0–34.0)
MCHC: 33.2 g/dL (ref 30.0–36.0)
MCV: 93.3 fL (ref 80.0–100.0)
Platelets: 336 10*3/uL (ref 150–400)
RBC: 3.87 MIL/uL — ABNORMAL LOW (ref 4.22–5.81)
RDW: 15.5 % (ref 11.5–15.5)
WBC: 14.7 10*3/uL — ABNORMAL HIGH (ref 4.0–10.5)
nRBC: 0 % (ref 0.0–0.2)

## 2020-03-03 LAB — BASIC METABOLIC PANEL
Anion gap: 11 (ref 5–15)
BUN: 18 mg/dL (ref 8–23)
CO2: 25 mmol/L (ref 22–32)
Calcium: 9.3 mg/dL (ref 8.9–10.3)
Chloride: 98 mmol/L (ref 98–111)
Creatinine, Ser: 0.74 mg/dL (ref 0.61–1.24)
GFR calc Af Amer: >60 mL/min (ref >60)
GFR calc non Af Amer: >60 mL/min (ref >60)
Glucose, Bld: 121 mg/dL — ABNORMAL HIGH (ref 70–99)
Potassium: 4.8 mmol/L (ref 3.5–5.1)
Sodium: 134 mmol/L — ABNORMAL LOW (ref 135–145)

## 2020-03-03 LAB — HEPARIN LEVEL (UNFRACTIONATED)
Heparin Unfractionated: 0.16 IU/mL — ABNORMAL LOW (ref 0.30–0.70)
Heparin Unfractionated: 0.2 IU/mL — ABNORMAL LOW (ref 0.30–0.70)

## 2020-03-03 MED ORDER — MIDAZOLAM HCL 2 MG/2ML IJ SOLN
INTRAMUSCULAR | Status: AC | PRN
  Administered 2020-03-03: 1 mg via INTRAVENOUS
  Administered 2020-03-03: 0.5 mg via INTRAVENOUS

## 2020-03-03 MED ORDER — GELATIN ABSORBABLE 12-7 MM EX MISC
CUTANEOUS | Status: AC
  Filled 2020-03-03: qty 1

## 2020-03-03 MED ORDER — FENTANYL CITRATE (PF) 100 MCG/2ML IJ SOLN
INTRAMUSCULAR | Status: AC
  Filled 2020-03-03: qty 2

## 2020-03-03 MED ORDER — HEPARIN BOLUS VIA INFUSION
2500.0000 [IU] | Freq: Once | INTRAVENOUS | Status: AC
  Administered 2020-03-03: 2500 [IU] via INTRAVENOUS
  Filled 2020-03-03: qty 2500

## 2020-03-03 MED ORDER — FENTANYL CITRATE (PF) 100 MCG/2ML IJ SOLN
INTRAMUSCULAR | Status: AC | PRN
  Administered 2020-03-03 (×2): 50 ug via INTRAVENOUS

## 2020-03-03 MED ORDER — MIDAZOLAM HCL 2 MG/2ML IJ SOLN
INTRAMUSCULAR | Status: AC
  Filled 2020-03-03: qty 2

## 2020-03-03 MED ORDER — LIDOCAINE HCL 1 % IJ SOLN
INTRAMUSCULAR | Status: AC
  Filled 2020-03-03: qty 20

## 2020-03-03 NOTE — TOC Benefit Eligibility Note (Signed)
Transition of Care [facility]) Benefit Eligibility Note    Patient Details  Name: KEWON STATLER MRN: [id number] Date of Birth: 1955-08-10   Medication/Dose: ELIQUIS  2.5 MG BID , ELIQUIS 5 MG BID   XARELTO 15 MG BID  XARELTO 20MG DAILY  Covered?: Yes  Tier: 2 Drug  Prescription Coverage Preferred Pharmacy: [facility]  Spoke with Person/Company/Phone Number:: ELLI  @ [employer] #  [phone]  Co-Pay: 20 % OF TOTAL  COST  Prior Approval: No  Deductible: Met       Memory Argue Phone Number: 03/03/2020, 9:42 AM

## 2020-03-03 NOTE — Progress Notes (Signed)
Heparin gtt restarted at 1400.

## 2020-03-03 NOTE — Consult Note (Signed)
Chief Complaint: Patient was seen in consultation today for retroperitoneal mass biopsy Chief Complaint  Patient presents with  . Leg Swelling   at the request of Dr Pearletha Alfred   Supervising Physician: Dr Ruthann Cancer  Patient Status: [facility] - In-pt  History of Present Illness: Brian Benton is a 64 y.o. male   Hx Bladder Cancer HTN; HLD Left leg swelling- painful To ED 9/20 for evaluation and management Denies injury; no prolonged immobilization Pain is bad enough-- can hardly walk  Follows with Dr Marin Olp Oncology-- aware of Retroperitoneal mass Was scheduled for bx next week  MR 02/20/20: IMPRESSION: 1. Large invasive left paraspinal/retroperitoneal mass from L4-S1, concerning for metastatic disease although a primary malignancy such as a sarcoma is also possible. 2. Mild lumbar disc and facet degeneration with mild lateral recess and neural foraminal stenosis at L4-5.  Doppler yesterday: IMPRESSION: Positive for extensive acute occlusive DVT throughout the left lower extremity and also likely within the external iliac vein in the Pelvis.  Now on Heparin drip  Request for RP mass biopsy per Dr Marin Olp Malignancy? Fibrosis? Lymphoma? Reviewed imaging with Dr Archie Patten procedure today    Past Medical History:  Diagnosis Date  . Cancer ([place])    bladder  . History of bladder cancer    s/p  TURBT 07-25-2014  . Hyperlipidemia   . Hypertension   . Mass of left hand     Past Surgical History:  Procedure Laterality Date  . BLADDER SURGERY     [place]  . COLONOSCOPY    . CYSTECTOMY    . CYSTOSCOPY WITH BIOPSY N/A 07/25/2014   Procedure: CYSTOSCOPY WITH BLADDER BIOPSY AND FULGERATION;  Surgeon: [name], MD;  Location: [facility];  Service: Urology;  Laterality: N/A;  . CYSTOSCOPY WITH BIOPSY N/A 11/28/2014   Procedure: CYSTOSCOPY WITH  BLADDER BIOPSY;  Surgeon: [name], MD;  Location: [facility];  Service:  Urology;  Laterality: N/A;  . HEMORRHOID SURGERY  03/15/2012   Procedure: HEMORRHOIDECTOMY;  Surgeon: [name], MD;  Location: [facility];  Service: General;  Laterality: N/A;  . HERNIA REPAIR    . INGUINAL HERNIA REPAIR Bilateral 2006  . MASS EXCISION Left 11/12/2018   Procedure: EXCISION MASS LEFT HAND;  Surgeon: Daryll Brod, MD;  Location: [place];  Service: Orthopedics;  Laterality: Left;  FAB  . RADIOLOGY WITH ANESTHESIA N/A 02/20/2020   Procedure: MRI LUMBAR W/O CONTRAST  WITH ANESTHESIA;  Surgeon: Radiologist, Medication, MD;  Location: [place];  Service: Radiology;  Laterality: N/A;  . TESTICLE SURGERY  2004   Ruptured Undescended Right testicle   . TRANSURETHRAL RESECTION OF BLADDER TUMOR WITH GYRUS (TURBT-GYRUS) N/A 05/23/2014   Procedure: TRANSURETHRAL RESECTION OF BLADDER TUMOR WITH GYRUS (TURBT-GYRUS);  Surgeon: [name], MD;  Location: [facility];  Service: Urology;  Laterality: N/A;  . WISDOM TOOTH EXTRACTION      Allergies: Patient has no known allergies.  Medications: Prior to Admission medications   Medication Sig Start Date End Date Taking? Authorizing Provider  ASPIRIN ADULT LOW STRENGTH 81 MG EC tablet TAKE 1 TABLET BY MOUTH EVERY DAY Patient taking differently: Take 81 mg by mouth daily.  10/03/19  Yes Patwardhan, Manish J, MD  fentaNYL (DURAGESIC) 50 MCG/HR Place 1 patch onto the skin every other day.    Yes [provider]  gabapentin (NEURONTIN) 600 MG tablet Take 1 tablet (600 mg total) by mouth in the  morning, at noon, in the evening, and at bedtime. 02/25/20  Yes Ennever, Rudell Cobb, MD  meloxicam (MOBIC) 15 MG tablet Take 15 mg by mouth daily. 01/21/20  Yes [provider]  oxyCODONE 10 MG TABS Take 1 tablet (10 mg total) by mouth every 4 (four) hours as needed for severe pain. 02/25/20  Yes Ennever, Rudell Cobb, MD  temazepam (RESTORIL) 30 MG capsule Take 1 capsule (30 mg total) by mouth at bedtime as needed for  sleep. 02/25/20  Yes Ennever, Rudell Cobb, MD  ZOFRAN 4 MG tablet Take 4 mg by mouth every 8 (eight) hours as needed for nausea/vomiting. 02/18/20  Yes [provider]     Family History  Problem Relation Age of Onset  . Cancer Mother        leukemia  . Diabetes Brother     Social History   Socioeconomic History  . Marital status: Married    Spouse name: Not on file  . Number of children: 2  . Years of education: Not on file  . Highest education level: Not on file  Occupational History  . Not on file  Tobacco Use  . Smoking status: Light Tobacco Smoker    Packs/day: 0.50    Years: 30.00    Pack years: 15.00    Types: Cigarettes    Last attempt to quit: 01/01/2019    Years since quitting: 1.1  . Smokeless tobacco: Never Used  Vaping Use  . Vaping Use: Never used  Substance and Sexual Activity  . Alcohol use: Not Currently    Comment: social  . Drug use: No  . Sexual activity: Not on file  Other Topics Concern  . Not on file  Social History Narrative  . Not on file   Social Determinants of Health   Financial Resource Strain:   . Difficulty of Paying Living Expenses: Not on file  Food Insecurity:   . Worried About Charity fundraiser in the Last Year: Not on file  . Ran Out of Food in the Last Year: Not on file  Transportation Needs:   . Lack of Transportation (Medical): Not on file  . Lack of Transportation (Non-Medical): Not on file  Physical Activity:   . Days of Exercise per Week: Not on file  . Minutes of Exercise per Session: Not on file  Stress:   . Feeling of Stress : Not on file  Social Connections:   . Frequency of Communication with Friends and Family: Not on file  . Frequency of Social Gatherings with Friends and Family: Not on file  . Attends Religious Services: Not on file  . Active Member of Clubs or Organizations: Not on file  . Attends Archivist Meetings: Not on file  . Marital Status: Not on file    Review of Systems: A 12  point ROS discussed and pertinent positives are indicated in the HPI above.  All other systems are negative.  Review of Systems  Constitutional: Positive for activity change. Negative for fatigue and fever.  Respiratory: Negative for cough and shortness of breath.   Cardiovascular: Negative for chest pain.  Gastrointestinal: Negative for abdominal pain.  Musculoskeletal: Positive for gait problem.  Psychiatric/Behavioral: Negative for behavioral problems and confusion.    Vital Signs: BP 115/86 (BP Location: Left Arm)   Pulse 82   Temp (!) 97.5 F (36.4 C) (Oral)   Resp 18   Ht 6' (1.829 m)   Wt 171 lb 4.8 oz (77.7  kg)   SpO2 96%   BMI 23.23 kg/m   Physical Exam Vitals reviewed.  Cardiovascular:     Rate and Rhythm: Normal rate and regular rhythm.     Heart sounds: Normal heart sounds.  Pulmonary:     Effort: Pulmonary effort is normal.     Breath sounds: Normal breath sounds.  Abdominal:     Palpations: Abdomen is soft.  Musculoskeletal:        General: Swelling present. Normal range of motion.     Left lower leg: Edema present.  Skin:    General: Skin is warm.  Neurological:     Mental Status: He is alert and oriented to person, place, and time.  Psychiatric:        Behavior: Behavior normal.     Imaging: MR LUMBAR SPINE WO CONTRAST  Result Date: 02/20/2020 CLINICAL DATA:  Lumbar radiculopathy. Left hip pain. History of bladder cancer. EXAM: MRI LUMBAR SPINE WITHOUT CONTRAST TECHNIQUE: Multiplanar, multisequence MR imaging of the lumbar spine was performed. No intravenous contrast was administered. COMPARISON:  CT abdomen and pelvis 05/02/2014 FINDINGS: Segmentation:  Standard. Alignment: Minimal left convex curvature of the lumbar spine. No listhesis. Vertebrae: There is an approximately 5.6 x 4.8 x 7.1 cm (AP x transverse x craniocaudal) T1 hypointense, heterogeneously T2 hyperintense left-sided paraspinal/retroperitoneal mass extending from L4-S1 which is new from  2015. The mass invades the anterior aspect of the left sacral ala and also appears to involve the left lateral aspect of the L5 vertebral body and left L5 transverse process. The mass involves the lower lumbar plexus with lateral displacement and invasion of the left psoas muscle. The left iliacus muscle appears edematous and may also be involved by tumor medially. Tumor extends anteriorly to abut the left-sided iliac vessels. No lumbar fracture is evident. Conus medullaris and cauda equina: Conus extends to the L2 level. Conus and cauda equina appear normal. Paraspinal and other soft tissues: Mild diffuse bladder wall thickening. Small T2 hyperintense lesions in both kidneys measuring up to 2.2 cm, likely cysts. Disc levels: L1-2: Negative. L2-3: Disc desiccation and mild disc space narrowing. Mild disc bulging eccentric to the left and mild right greater than left facet hypertrophy result in borderline left lateral recess stenosis without spinal or neural foraminal stenosis. L3-4: A small left subarticular and left foraminal disc protrusion, minimal disc bulging, and mild facet hypertrophy result in borderline left lateral recess and borderline bilateral neural foraminal stenosis without spinal stenosis. L4-5: Disc desiccation and mild disc space narrowing. Mild disc bulging, a small left foraminal disc protrusion with annular fissure, and mild facet hypertrophy result in mild bilateral lateral recess stenosis and mild bilateral neural foraminal stenosis without significant spinal stenosis. L5-S1: No disc herniation or stenosis. Tumor extends into the lateral aspect of the left neural foramen. IMPRESSION: 1. Large invasive left paraspinal/retroperitoneal mass from L4-S1, concerning for metastatic disease although a primary malignancy such as a sarcoma is also possible. 2. Mild lumbar disc and facet degeneration with mild lateral recess and neural foraminal stenosis at L4-5. These results will be called to the  ordering clinician or representative by the Radiologist Assistant, and communication documented in the PACS or [employer]. Electronically Signed   By: Logan Bores M.D.   On: 02/20/2020 12:00   CT ANGIO AO+BIFEM W & OR WO CONTRAST  Result Date: 03/02/2020 CLINICAL DATA:  Left leg swelling and discoloration, known history of EXAM: CT ANGIOGRAPHY OF ABDOMINAL AORTA WITH ILIOFEMORAL RUNOFF TECHNIQUE: Multidetector CT imaging  of the abdomen, pelvis and lower extremities was performed using the standard protocol during bolus administration of intravenous contrast. Multiplanar CT image reconstructions and MIPs were obtained to evaluate the vascular anatomy. CONTRAST:  173mL OMNIPAQUE IOHEXOL 350 MG/ML SOLN COMPARISON:  MRI of the lumbar spine from 02/20/2020, CT from 02/26/2020 FINDINGS: VASCULAR Aorta: Atherosclerotic calcifications are noted without aneurysmal dilatation or dissection. Celiac: Patent without evidence of aneurysm, dissection, vasculitis or significant stenosis. SMA: Patent without evidence of aneurysm, dissection, vasculitis or significant stenosis. Renals: Both renal arteries are patent without evidence of aneurysm, dissection, vasculitis, fibromuscular dysplasia or significant stenosis. IMA: Patent without evidence of aneurysm, dissection, vasculitis or significant stenosis. RIGHT Lower Extremity Iliacs: Atherosclerotic calcifications are noted without aneurysmal dilatation. Runoff: Common femoral and superficial femoral artery are within normal limits. Ostial artery and popliteal trifurcation are widely patent. Three-vessel runoff to the right ankle is noted. LEFT Lower Extremity Iliacs: Iliac artery is widely patent with mild atherosclerotic calcifications. No focal stenosis is seen. Runoff: Common femoral and superficial femoral arteries are widely patent. Popliteal artery demonstrates mild atherosclerotic calcifications. Popliteal trifurcation is patent with three-vessel runoff to the  left foot. Veins: There is soft tissue mass along the course of the left common iliac vein consistent with the known history. The venous system below this mass lesion is dilated when compared with the right-sided venous system. This contributes to the lower extremity swelling. Possibility of underlying thrombus deserves consideration and ultrasound evaluation is recommended. Review of the MIP images confirms the above findings. NON-VASCULAR Lower chest: Bibasilar atelectatic changes are noted. Emphysematous changes are seen as well. Hepatobiliary: No focal liver abnormality is seen. No gallstones, gallbladder wall thickening, or biliary dilatation. Pancreas: Unremarkable. No pancreatic ductal dilatation or surrounding inflammatory changes. Spleen: Normal in size without focal abnormality. Adrenals/Urinary Tract: Adrenal glands are within normal limits bilaterally. Kidneys demonstrate renal cysts bilaterally similar to that seen on prior CT examination. No obstructive changes are noted. Bladder is well distended. Stomach/Bowel: No obstructive or inflammatory changes of the colon are seen. The appendix is not well visualized although no inflammatory changes to suggest appendicitis are seen. Small bowel and stomach are within normal limits. Lymphatic: No sizable lymphadenopathy is identified. Reproductive: Prostate is unremarkable. Other: Soft tissue mass is again noted along the left psoas muscle and iliacus muscle extending in the iliac fossa similar to that seen on prior MRI examination. This causes some mass effect upon the adjacent iliac vein with dilatation of the venous system inferiorly. Mild soft tissue edema is noted likely related to the decreased venous outflow. Possibility of underlying deep venous thrombosis is raised. Ultrasound may be helpful for further evaluation. Musculoskeletal: Known mass in the L4-5 L5-S1 region on the left. Some mild bony destruction in the sacrum is seen. The overall appearance  is similar to that noted on prior MRI. IMPRESSION: VASCULAR No acute arterial abnormality is noted. Considerable venous dilatation is noted below the level of the known mass lesion in the left hemipelvis likely related to localized compression. The possibility of underlying deep venous thrombosis deserves consideration and ultrasound may be helpful for further evaluation. NON-VASCULAR Left-sided paraspinal mass lesion causing mass effect and decreased outflow in the venous system on the left as described above. No other focal abnormality is noted. Electronically Signed   By: Inez Catalina M.D.   On: 03/02/2020 03:26   CT CHEST ABDOMEN PELVIS W CONTRAST  Result Date: 02/26/2020 CLINICAL DATA:  History of bladder cancer. Infiltrative paraspinal left lower lumbar mass  detected on recent MRI lumbar spine. EXAM: CT CHEST, ABDOMEN, AND PELVIS WITH CONTRAST TECHNIQUE: Multidetector CT imaging of the chest, abdomen and pelvis was performed following the standard protocol during bolus administration of intravenous contrast. CONTRAST:  181mL OMNIPAQUE IOHEXOL 300 MG/ML  SOLN COMPARISON:  02/20/2020 lumbar spine MRI. 05/02/2014 CT abdomen/pelvis. FINDINGS: CT CHEST FINDINGS Cardiovascular: Normal heart size. No significant pericardial effusion/thickening. Atherosclerotic nonaneurysmal thoracic aorta. Normal caliber pulmonary arteries. No central pulmonary emboli. Mediastinum/Nodes: No discrete thyroid nodules. Unremarkable esophagus. No pathologically enlarged axillary, mediastinal or hilar lymph nodes. Lungs/Pleura: No pneumothorax. No pleural effusion. Moderate paraseptal and mild centrilobular emphysema with diffuse bronchial wall thickening. No acute consolidative airspace disease, lung masses or significant pulmonary nodules. Musculoskeletal: No aggressive appearing focal osseous lesions. Mild thoracic spondylosis. CT ABDOMEN PELVIS FINDINGS Hepatobiliary: Normal liver with no liver mass. Normal gallbladder with no  radiopaque cholelithiasis. No biliary ductal dilatation. Pancreas: Normal, with no mass or duct dilation. Spleen: Normal size. No mass. Adrenals/Urinary Tract: Normal adrenals. Small simple bilateral renal cortical cysts, largest 1.9 cm in the interpolar right kidney. Several subcentimeter hypodense renal cortical lesions in both kidneys are too small to characterize and require no follow-up. No hydronephrosis. Mild diffuse bladder wall thickening, chronic. Stomach/Bowel: Normal non-distended stomach. Normal caliber small bowel with no small bowel wall thickening. Normal appendix. Anterior right upper quadrant cecum. Oral contrast transits to right colon. Normal large bowel with no diverticulosis, large bowel wall thickening or pericolonic fat stranding. Vascular/Lymphatic: Atherosclerotic nonaneurysmal abdominal aorta. Patent portal, splenic, hepatic and renal veins. Enlarged 1.7 cm left external iliac lymph node (series 2/image 106), new. Poorly marginated heterogeneously enhancing 6.8 x 4.1 cm left paraspinal soft tissue mass at L5 level (series 2/image 95). No additional pathologically enlarged abdominopelvic nodes. Reproductive: Mildly enlarged prostate. Other: No pneumoperitoneum, ascites or focal fluid collection. Musculoskeletal: No aggressive appearing focal osseous lesions. Mild lumbar spondylosis. IMPRESSION: 1. Poorly marginated heterogeneously enhancing 6.8 x 4.1 cm left paraspinal soft tissue mass at L5 level, suspicious for metastatic disease. 2. Additional mild left external iliac lymphadenopathy, suspicious for nodal metastasis. 3. No additional potential sites of metastatic disease in the chest, abdomen or pelvis. 4. Chronic findings include: Mildly enlarged prostate. Mild diffuse bladder wall thickening, chronic. Aortic Atherosclerosis (ICD10-I70.0) and Emphysema (ICD10-J43.9). Electronically Signed   By: Ilona Sorrel M.D.   On: 02/26/2020 15:33   US Venous Img Lower Unilateral Left  Result  Date: 03/02/2020 CLINICAL DATA:  Sudden onset left lower extremity edema. History bladder cancer and left retroperitoneal metastatic disease. EXAM: LEFT LOWER EXTREMITY VENOUS DOPPLER ULTRASOUND TECHNIQUE: Gray-scale sonography with graded compression, as well as color Doppler and duplex ultrasound were performed to evaluate the lower extremity deep venous systems from the level of the common femoral vein and including the common femoral, femoral, profunda femoral, popliteal and calf veins including the posterior tibial, peroneal and gastrocnemius veins when visible. The superficial great saphenous vein was also interrogated. Spectral Doppler was utilized to evaluate flow at rest and with distal augmentation maneuvers in the common femoral, femoral and popliteal veins. COMPARISON:  None. FINDINGS: Contralateral Common Femoral Vein: Respiratory phasicity is normal and symmetric with the symptomatic side. No evidence of thrombus. Normal compressibility. Common Femoral Vein: The common femoral vein is not compressible. The lumen is expanded and filled with heterogeneous low level internal echoes. No evidence of color flow on color Doppler imaging. Findings are consistent with acute occlusive DVT. Saphenofemoral Junction: Occlusive DVT extends into the saphenofemoral junction. Profunda Femoral Vein: Occlusive DVT  extends into the profunda femoral vein. Femoral Vein: Occlusive DVT extends into the femoral vein and continues throughout the thigh. Popliteal Vein: Occlusive DVT throughout the popliteal vein extending into the calf veins. Calf Veins: Occlusive DVT noted in the peroneal and posterior tibial veins. Superficial Great Saphenous Vein: No evidence of thrombus. Normal compressibility. Venous Reflux:  None. Other Findings: There appears to be occlusive DVT in the external iliac vein as well. IMPRESSION: Positive for extensive acute occlusive DVT throughout the left lower extremity and also likely within the external  iliac vein in the pelvis. Electronically Signed   By: Jacqulynn Cadet M.D.   On: 03/02/2020 10:17    Labs:  CBC: Recent Labs    02/25/20 1510 03/02/20 0105 03/03/20 0733  WBC 13.7* 14.5* 14.7*  HGB 11.8* 12.0* 12.0*  HCT 35.3* 36.7* 36.1*  PLT 346 327 336    COAGS: Recent Labs    03/02/20 0105  INR 1.0    BMP: Recent Labs    02/25/20 1510 03/02/20 0105 03/03/20 0733  NA 139 135 134*  K 4.1 5.1 4.8  CL 104 98 98  CO2 29 28 25   GLUCOSE 97 119* 121*  BUN 12 21 18   CALCIUM 9.6 9.3 9.3  CREATININE 0.75 0.94 0.74  GFRNONAA >60 >60 >60  GFRAA >60 >60 >60    LIVER FUNCTION TESTS: Recent Labs    02/25/20 1510  BILITOT 0.3  AST 148*  ALT 352*  ALKPHOS 421*  PROT 7.1  ALBUMIN 3.9    TUMOR MARKERS: No results for input(s): AFPTM, CEA, CA199, CHROMGRNA in the last 8760 hours.  Assessment and Plan:  Hx bladder cancer New LLE swelling; +DVT Imaging revealing retroperitoneal mass Scheduled now for biopsy of same Risks and benefits of retroperitoneal mass bx was discussed with the patient and/or patient's family including, but not limited to bleeding, infection, damage to adjacent structures or low yield requiring additional tests.  All of the questions were answered and there is agreement to proceed.  Consent signed and in chart.   Thank you for this interesting consult.  I greatly enjoyed meeting Brian Benton and look forward to participating in their care.  A copy of this report was sent to the requesting provider on this date.  Electronically Signed: Lavonia Drafts, PA-C 03/03/2020, 10:43 AM   I spent a total of 40 Minutes    in face to face in clinical consultation, greater than 50% of which was counseling/coordinating care for retroperitoneal mass bx

## 2020-03-03 NOTE — Progress Notes (Signed)
ANTICOAGULATION CONSULT NOTE - Follow Up Consult  Pharmacy Consult for IV Heparin Indication: DVT  No Known Allergies  Patient Measurements: Height: 6' (182.9 cm) Weight: 77.7 kg (171 lb 4.8 oz) IBW/kg (Calculated) : 77.6 Heparin Dosing Weight: 75 kg  Vital Signs: Temp: 99.2 F (37.3 C) (09/21 1632) Temp Source: Oral (09/21 1632) BP: 133/78 (09/21 1632) Pulse Rate: 88 (09/21 1632)  Labs: Recent Labs    03/02/20 0105 03/02/20 1245 03/02/20 2143 03/03/20 0733 03/03/20 1758  HGB 12.0*  --   --  12.0*  --   HCT 36.7*  --   --  36.1*  --   PLT 327  --   --  336  --   LABPROT 12.7  --   --   --   --   INR 1.0  --   --   --   --   HEPARINUNFRC  --    < > 0.12* 0.16* 0.20*  CREATININE 0.94  --   --  0.74  --    < > = values in this interval not displayed.    Estimated Creatinine Clearance: 101 mL/min (by C-G formula based on SCr of 0.74 mg/dL).   Medications:  Scheduled:  . aspirin EC  81 mg Oral Daily  . Chlorhexidine Gluconate Cloth  6 each Topical Daily  . fentaNYL  1 patch Transdermal Q48H  . gabapentin  600 mg Oral TID    Assessment: 64 yr old man with LLE swelling was found to have extensive LLE DVT. Pharmacy was consulted for heparin dosing.   Due to subtherapeutic heparin level earlier today (0.16 units/ml), pt rec'd heparin 2000 units IV bolus X 1 and heparin infusion was increased to 1800 units/hr. Pt is S/P CT-guided biopsy of infiltrative mass involving L hemipelvis around 12 noon today; heparin infusion was paused for the procedure and restarted at 1800 units/hr at 1400 this afternoon. Heparin level ~4 hrs after restarting heparin infusion was 0.20 units/ml, which is below the goal range for this pt. CBC stable. Per RN, no issues with IV or bleeding observed post-IR procedure.   Of note, pt had unwitnessed fall this afternoon; CT this evening showed no acute intracranial abnormality or hemorrhage.  Goal of Therapy:  Heparin level 0.3-0.7 units/ml Monitor  platelets by anticoagulation protocol: Yes   Plan:  Increase heparin infusion to 1950 units/hr (will omit bolus, due to procedure this afternoon and heparin level being drawn only 4 hrs after infusion restarted post procedure) Check 6-hr heparin level Monitor daily heparin level, CBC Monitor for signs/symptoms of bleeding May need to consider enoxaparin if cannot get heparin levels within goal range, with extensive DVT  Thank you for allowing pharmacy to be a part of this patient's care.  Gillermina Hu, PharmD, BCPS, [facility] Clinical Pharmacist 03/03/2020 7:07 PM

## 2020-03-03 NOTE — Progress Notes (Signed)
ANTICOAGULATION CONSULT NOTE - Follow Up Consult  Pharmacy Consult for Heparin Indication: DVT  No Known Allergies  Patient Measurements: Height: 6' (182.9 cm) Weight: 77.7 kg (171 lb 4.8 oz) IBW/kg (Calculated) : 77.6 Heparin Dosing Weight: 77.7 kg  Vital Signs: Temp: 97.5 F (36.4 C) (09/21 0825) Temp Source: Oral (09/21 0825) BP: 115/86 (09/21 0825) Pulse Rate: 82 (09/21 0825)  Labs: Recent Labs    03/02/20 0105 03/02/20 1245 03/02/20 2143 03/03/20 0733  HGB 12.0*  --   --  12.0*  HCT 36.7*  --   --  36.1*  PLT 327  --   --  336  LABPROT 12.7  --   --   --   INR 1.0  --   --   --   HEPARINUNFRC  --  0.17* 0.12* 0.16*  CREATININE 0.94  --   --  0.74    Estimated Creatinine Clearance: 101 mL/min (by C-G formula based on SCr of 0.74 mg/dL).    Assessment: Anticoag: extensive  L Leg DVT. HL 0.16 remains low despite multiple rate increases. Hgb 12 stable. Plts WNL.  Goal of Therapy:  Heparin level 0.3-0.7 units/ml Monitor platelets by anticoagulation protocol: Yes   Plan:  Heparin 2500 units IV bolus Increase infusion to 1800 units/hr Recheck heparin level in 8 hrs. Daily HL and CBC If we cannot get heparin levels in goal range with extensive DVT, we may need to consider Lovenox. Duragesic patch?   Lakshya Mcgillicuddy S. Alford Highland, PharmD, BCPS Clinical Staff Pharmacist [email or web address] Alford Highland, Corissa Oguinn Stillinger 03/03/2020,9:09 AM

## 2020-03-03 NOTE — Consult Note (Signed)
Referral MD  Reason for Referral: Left lower leg DVT-extensive; left retroperitoneal mass-undefined  Chief Complaint  Patient presents with  . Leg Swelling  : My left leg got swollen.  HPI: Mr. Brannen is known to me.  Is a very nice 64 year old white male.  I saw him a week ago.  He has this left retroperitoneal mass.  It was causing some radicular pain in the left thigh.  We did a CT scan of his body.  We found no evidence of a primary site that would suggest a lung cancer since he was a smoker.  He was set up for a biopsy.  Over the weekend, he began to have swelling and pain in the left leg.  He went to the emergency room at [facility]  He underwent a CT angiogram of the abdomen and pelvis.  He was found to have a left-sided paraspinal mass and decreased outflow in the venous system on the left.  He then had a Doppler of the left leg.  This was done on 20 September.  He had extensive occlusive thrombus in the left extremity up to the external iliac vein.  He was subsequently admitted to [facility].  He has been started on heparin.  He has had no cough.  He has had no bleeding.  He has had no change in bowel or bladder habits.  He has had the pain and swelling in the left leg.  His appetite has been okay.  He has had no nausea or vomiting.  Currently, his performance status is ECOG 1.     Past Medical History:  Diagnosis Date  . Cancer ([place])    bladder  . History of bladder cancer    s/p  TURBT 07-25-2014  . Hyperlipidemia   . Hypertension   . Mass of left hand   :  Past Surgical History:  Procedure Laterality Date  . BLADDER SURGERY     [place]  . COLONOSCOPY    . CYSTECTOMY    . CYSTOSCOPY WITH BIOPSY N/A 07/25/2014   Procedure: CYSTOSCOPY WITH BLADDER BIOPSY AND FULGERATION;  Surgeon: [name], MD;  Location: [facility];  Service: Urology;  Laterality: N/A;  . CYSTOSCOPY WITH BIOPSY N/A 11/28/2014   Procedure: CYSTOSCOPY WITH   BLADDER BIOPSY;  Surgeon: [name], MD;  Location: [facility];  Service: Urology;  Laterality: N/A;  . HEMORRHOID SURGERY  03/15/2012   Procedure: HEMORRHOIDECTOMY;  Surgeon: [name], MD;  Location: [facility];  Service: General;  Laterality: N/A;  . HERNIA REPAIR    . INGUINAL HERNIA REPAIR Bilateral 2006  . MASS EXCISION Left 11/12/2018   Procedure: EXCISION MASS LEFT HAND;  Surgeon: Daryll Brod, MD;  Location: [place];  Service: Orthopedics;  Laterality: Left;  FAB  . RADIOLOGY WITH ANESTHESIA N/A 02/20/2020   Procedure: MRI LUMBAR W/O CONTRAST  WITH ANESTHESIA;  Surgeon: Radiologist, Medication, MD;  Location: [place];  Service: Radiology;  Laterality: N/A;  . TESTICLE SURGERY  2004   Ruptured Undescended Right testicle   . TRANSURETHRAL RESECTION OF BLADDER TUMOR WITH GYRUS (TURBT-GYRUS) N/A 05/23/2014   Procedure: TRANSURETHRAL RESECTION OF BLADDER TUMOR WITH GYRUS (TURBT-GYRUS);  Surgeon: [name], MD;  Location[facility];  Service: Urology;  Laterality: N/A;  . WISDOM TOOTH EXTRACTION    :   Current Facility-Administered Medications:  .  acetaminophen (TYLENOL) tablet 650 mg, 650 mg, Oral, Q6H PRN **OR**  acetaminophen (TYLENOL) suppository 650 mg, 650 mg, Rectal, Q6H PRN, Chotiner, Yevonne Aline, MD .  aspirin EC tablet 81 mg, 81 mg, Oral, Daily, Chotiner, Yevonne Aline, MD, 81 mg at 03/02/20 2120 .  Chlorhexidine Gluconate Cloth 2 % PADS 6 each, 6 each, Topical, Daily, Shela Leff, MD, 6 each at 03/02/20 1938 .  fentaNYL (DURAGESIC) 50 MCG/HR 1 patch, 1 patch, Transdermal, Q48H, Chotiner, Yevonne Aline, MD, 1 patch at 03/02/20 2102 .  gabapentin (NEURONTIN) tablet 600 mg, 600 mg, Oral, TID, Chotiner, Yevonne Aline, MD, 600 mg at 03/02/20 2120 .  heparin ADULT infusion 100 units/mL (25000 units/273mL sodium chloride 0.45%), 1,500 Units/hr, Intravenous, Continuous, Rolla Flatten, [facility], Last Rate: 15 mL/hr at 03/02/20 2311, 1,500  Units/hr at 03/02/20 2311 .  HYDROmorphone (DILAUDID) injection 1 mg, 1 mg, Intravenous, Q4H PRN, Chotiner, Yevonne Aline, MD, 1 mg at 03/03/20 0419 .  lactated ringers infusion, , Intravenous, Continuous, Chotiner, Yevonne Aline, MD, Last Rate: 100 mL/hr at 03/02/20 2125, New Bag at 03/02/20 2125 .  ondansetron (ZOFRAN) tablet 4 mg, 4 mg, Oral, Q6H PRN **OR** ondansetron (ZOFRAN) injection 4 mg, 4 mg, Intravenous, Q6H PRN, Chotiner, Yevonne Aline, MD .  oxyCODONE (Oxy IR/ROXICODONE) immediate release tablet 10 mg, 10 mg, Oral, Q6H PRN, Chotiner, Yevonne Aline, MD .  senna-docusate (Senokot-S) tablet 1 tablet, 1 tablet, Oral, QHS PRN, Chotiner, Yevonne Aline, MD .  temazepam (RESTORIL) capsule 30 mg, 30 mg, Oral, QHS PRN, Chotiner, Yevonne Aline, MD:  . aspirin EC  81 mg Oral Daily  . Chlorhexidine Gluconate Cloth  6 each Topical Daily  . fentaNYL  1 patch Transdermal Q48H  . gabapentin  600 mg Oral TID  :  No Known Allergies:  Family History  Problem Relation Age of Onset  . Cancer Mother        leukemia  . Diabetes Brother   :  Social History   Socioeconomic History  . Marital status: Married    Spouse name: Not on file  . Number of children: 2  . Years of education: Not on file  . Highest education level: Not on file  Occupational History  . Not on file  Tobacco Use  . Smoking status: Light Tobacco Smoker    Packs/day: 0.50    Years: 30.00    Pack years: 15.00    Types: Cigarettes    Last attempt to quit: 01/01/2019    Years since quitting: 1.1  . Smokeless tobacco: Never Used  Vaping Use  . Vaping Use: Never used  Substance and Sexual Activity  . Alcohol use: Not Currently    Comment: social  . Drug use: No  . Sexual activity: Not on file  Other Topics Concern  . Not on file  Social History Narrative  . Not on file   Social Determinants of Health   Financial Resource Strain:   . Difficulty of Paying Living Expenses: Not on file  Food Insecurity:   . Worried About Sales executive in the Last Year: Not on file  . Ran Out of Food in the Last Year: Not on file  Transportation Needs:   . Lack of Transportation (Medical): Not on file  . Lack of Transportation (Non-Medical): Not on file  Physical Activity:   . Days of Exercise per Week: Not on file  . Minutes of Exercise per Session: Not on file  Stress:   . Feeling of Stress : Not on file  Social Connections:   . Frequency of  Communication with Friends and Family: Not on file  . Frequency of Social Gatherings with Friends and Family: Not on file  . Attends Religious Services: Not on file  . Active Member of Clubs or Organizations: Not on file  . Attends Archivist Meetings: Not on file  . Marital Status: Not on file  Intimate Partner Violence:   . Fear of Current or Ex-Partner: Not on file  . Emotionally Abused: Not on file  . Physically Abused: Not on file  . Sexually Abused: Not on file  :  Review of Systems  Constitutional: Negative.   Eyes: Negative.   Respiratory: Negative.   Cardiovascular: Negative.   Gastrointestinal: Negative.   Genitourinary: Negative.   Musculoskeletal: Negative.   Skin: Negative.   Neurological: Negative.   Endo/Heme/Allergies: Negative.   Psychiatric/Behavioral: Negative.      Exam:  This is a well-developed well-nourished white male in no obvious distress.  Vital signs are temperature 98.4.  Pulse 75.  Blood pressure 131/78.  Weight is 171 pounds.  Head and neck exam shows no scleral icterus.  He has no adenopathy in the neck.  Thyroid is nonpalpable.  Lungs are clear bilaterally.  Cardiac exam regular rate and rhythm with no murmurs, rubs or bruits.  Abdomen is soft.  Has good bowel sounds.  There is no fluid wave.  There may be some slight fullness in the left lower quadrant.  He has no palpable liver or spleen tip.  Back exam shows no tenderness over the spine.  Extremities shows swelling in the left leg.  He has little bit of hyperemia of the left leg.   Right leg is unremarkable.    Patient Vitals for the past 24 hrs:  BP Temp Temp src Pulse Resp SpO2 Weight  03/03/20 0313 131/78 98.4 F (36.9 C) Oral 75 17 97 % --  03/03/20 0000 -- -- -- -- -- -- 171 lb 4.8 oz (77.7 kg)  03/02/20 2355 139/78 98.2 F (36.8 C) Oral 79 18 95 % --  03/02/20 2136 127/73 98.3 F (36.8 C) Oral 76 17 97 % --  03/02/20 1821 131/79 98.2 F (36.8 C) Oral 79 16 93 % --  03/02/20 1700 117/72 -- -- 79 17 94 % --  03/02/20 1630 122/64 98.2 F (36.8 C) -- 81 19 94 % --  03/02/20 1600 129/84 -- -- 78 15 93 % --  03/02/20 1430 140/77 -- -- 84 20 94 % --  03/02/20 1400 131/76 -- -- 81 20 95 % --  03/02/20 1300 123/77 -- -- 75 (!) 22 95 % --  03/02/20 1227 126/78 98.5 F (36.9 C) Oral 80 (!) 23 92 % --  03/02/20 0730 128/72 -- -- 83 17 93 % --  03/02/20 0712 128/74 99.2 F (37.3 C) Oral 88 16 94 % --     Recent Labs    03/02/20 0105  WBC 14.5*  HGB 12.0*  HCT 36.7*  PLT 327   Recent Labs    03/02/20 0105  NA 135  K 5.1  CL 98  CO2 28  GLUCOSE 119*  BUN 21  CREATININE 0.94  CALCIUM 9.3    Blood smear review: None  Pathology: None    Assessment and Plan: Mr. Antonelli is a very nice 64 year old white male.  He has is retroperitoneal mass in the left retroperitoneal/paraspinal area.  We do not know if this is malignant or not.  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  Regardless, he needs a biopsy.  What I really worry about is at this left leg thrombus is going to cause a lot of problems for him in the future.  He is very active.  He does CrossFit.  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  I know no if there is any way that a stent can be placed into the iliac vein.  Even if this mass is treated, I think the vein will be permanently affected and compressed.  I do not know if there is any way that a thrombectomy can be done to remove  the thrombus.  Again, with any patient with cancer, we have to keep in mind the quality of life.  Mr. Seago' quality of life is dictated by his activity level.  Again he is very active.  I would want to make sure that he stays active.  I am unsure of thrombolytic therapy is indicated given that he has this mass.  He is on heparin.  I would keep on heparin until we know that he is not can have any invasive procedure.  Hopefully, vascular surgery will be able to help this thrombus.  I realize that an invasive procedure would be more involved.  However, quality of life is critical here.  I just want Mr. Krajewski to be able to function and enjoy what he likes to do.  I will see if interventional radiology can biopsy this retroperitoneal mass.  I know that he will get fantastic care from all the staff upon [address].  Lattie Haw, MD  Psalm 89:2

## 2020-03-03 NOTE — Sedation Documentation (Signed)
Report given to floor RN. Instructions to restart Heparin gtt at 1400

## 2020-03-03 NOTE — Progress Notes (Signed)
Pt had an unwitnessed fall.  Pt called nurse, when nurse arrived to pt's room pt was sitting in his chair and stated: "I fell, I got tangled on this lines and fell when I was trying to use the urinal." Nurse called charge RN, vitals were taken, MD was paged.   Nurse ordered Low bed, Fall risk band placed, and mats in place. Chair alarm placed.  Pt was educated on calling nurse before attempting to get out of chair or bed. Pt stated understanding.   Call bell within reach.   CT order in place.

## 2020-03-03 NOTE — Plan of Care (Signed)
  Problem: Education: Goal: Knowledge of General Education information will improve Description Including pain rating scale, medication(s)/side effects and non-pharmacologic comfort measures Outcome: Progressing   

## 2020-03-03 NOTE — Progress Notes (Addendum)
  Progress Note    03/03/2020 7:58 AM  Subjective:  Patient believes LLE edema improved some   Vitals:   03/02/20 2355 03/03/20 0313  BP: 139/78 131/78  Pulse: 79 75  Resp: 18 17  Temp: 98.2 F (36.8 C) 98.4 F (36.9 C)  SpO2: 95% 97%   Physical Exam: Lungs: Non labored Extremities: palpable DP R>L; compartments soft, no pain with palpation L calf Abdomen:  Soft, NT, ND Neurologic: A&O  CBC    Component Value Date/Time   WBC 14.5 (H) 03/02/2020 0105   RBC 3.85 (L) 03/02/2020 0105   HGB 12.0 (L) 03/02/2020 0105   HGB 11.8 (L) 02/25/2020 1510   HCT 36.7 (L) 03/02/2020 0105   PLT 327 03/02/2020 0105   PLT 346 02/25/2020 1510   MCV 95.3 03/02/2020 0105   MCH 31.2 03/02/2020 0105   MCHC 32.7 03/02/2020 0105   RDW 15.9 (H) 03/02/2020 0105   LYMPHSABS 1.2 03/02/2020 0105   MONOABS 1.1 (H) 03/02/2020 0105   EOSABS 1.1 (H) 03/02/2020 0105   BASOSABS 0.1 03/02/2020 0105    BMET    Component Value Date/Time   NA 135 03/02/2020 0105   K 5.1 03/02/2020 0105   CL 98 03/02/2020 0105   CO2 28 03/02/2020 0105   GLUCOSE 119 (H) 03/02/2020 0105   BUN 21 03/02/2020 0105   CREATININE 0.94 03/02/2020 0105   CREATININE 0.75 02/25/2020 1510   CALCIUM 9.3 03/02/2020 0105   GFRNONAA >60 03/02/2020 0105   GFRNONAA >60 02/25/2020 1510   GFRAA >60 03/02/2020 0105   GFRAA >60 02/25/2020 1510    INR    Component Value Date/Time   INR 1.0 03/02/2020 0105     Intake/Output Summary (Last 24 hours) at 03/03/2020 0758 Last data filed at 03/03/2020 0600 Gross per 24 hour  Intake 1433.77 ml  Output 0 ml  Net 1433.77 ml     Assessment/Plan:  64 y.o. male with L iliac DVT   LLE edema improved subjectively; compartments are soft, faintly palpable L DP Ok to mobilize; elevate LLE when in bed Continue IV heparin Ok to advance diet Dr. Oneida Alar to evaluate later today and provide further treatment plans    Dagoberto Ligas, PA-C [facility] [phone] 03/03/2020 7:58 AM  Agree with above. Left leg certainly no worse may be slightly improved. Currently does not appear to be in a limb threatening situation. Dr. Antonieta Pert note reviewed. I believe the primary goal currently will be to get a diagnosis as this will certainly affect any treatment options. He is scheduled for a biopsy of the retroperitoneal mass in interventional radiology today. We will make further recommendations based on final diagnosis.  Continue heparin for now. Patient and his wife updated at the bedside.  Ruta Hinds, MD [facility] [place] Office: [phone]

## 2020-03-03 NOTE — Procedures (Signed)
Pre procedural Dx: History of bladder cancer, now with infiltrative mass involving the left hemi-pelvis Post procedural Dx: Same  Technically successful CT guided biopsy of infiltrative mass involving the left hemipelvis.    EBL: None.  Complications: None immediate.   Ronny Bacon, MD Pager #: [phone]

## 2020-03-03 NOTE — Progress Notes (Signed)
PROGRESS NOTE    Brian Benton  OMV:[id number] DOB: 14-Feb-1956 DOA: 03/01/2020 PCP: Aurea Graff, PA-C (Inactive)   Chief Complaint  Patient presents with  . Leg Swelling    Brief Narrative: 69 male with history of bladder cancer being followed at [facility] and found to have a left hemipelvis retroperitoneal mass and was being planned for biopsy but admitted with sudden left leg swelling pain 03/02/20. CT angio of abdomen pelvis showed left-sided paraspinal mass and decreased output in the venous system on the left, Doppler of the left leg showed extensive occlusive thrombus in the left extremity up to the external iliac vein, vascular surgery was consulted, patient was placed on heparin infusion and was admitted.  Subjective: Seen this morning. Swelling and hardness is not as hard as it was. on RA Wife at bedside Saw Onco and VVS PA this am   Assessment & Plan:  Left hemipelvis infiltrating mass causing decreased output in the venous system: Malignant versus benign, unclear etiology.Appreciate oncology input, Dr Marin Olp was being worked up as outpatient had MRI of the lumbar spine on 02/20/2020 and  Now plan is to get  IR guided biopsy today.  Acute deep vein thrombosis (DVT) of left lower extremity: Slowly slightly improving.  Vascular surgery on board appreciate input, on heparin infusion.  Leukocytosis elevated white counts but stable.  Afebrile.  Monitor  Tobacco abuse: Smoking cessation prior to discharge  Hypertension: BP well controlled.  He is not on medication.  History of bladder cancer in 2015 appears he had superficial bladder cancer that was treated with intravesicular therapy   DVT prophylaxis: heparin bolus via infusion 2,500 Units Start: 03/03/20 1000 Code Status:   Code Status: Full Code  Family Communication: plan of care discussed with patient and his wife at bedside.  Status is: Inpatient Remains inpatient appropriate because:For management of acute DVT  and left hemipelvis mass,  Dispo: The patient is from: Home              Anticipated d/c is to: Home              Anticipated d/c date is: 2 days              Patient currently is not medically stable to d/c.  Nutrition: Diet Order            Diet NPO time specified Except for: Sips with Meds  Diet effective now                 Body mass index is 23.23 kg/m.  Consultants:see note  Procedures: 03/02/20: DUPLEX: Positive for extensive acute occlusive DVT throughout the left lower extremity and also likely within the external iliac vein in the Pelvis  Microbiology:see note Blood Culture No results found for: SDES, SPECREQUEST, CULT, REPTSTATUS  Other culture-see note  Medications: Scheduled Meds: . aspirin EC  81 mg Oral Daily  . Chlorhexidine Gluconate Cloth  6 each Topical Daily  . fentaNYL  1 patch Transdermal Q48H  . gabapentin  600 mg Oral TID  . heparin  2,500 Units Intravenous Once   Continuous Infusions: . heparin 1,500 Units/hr (03/03/20 0829)  . lactated ringers 100 mL/hr at 03/03/20 6283    Antimicrobials: Anti-infectives (From admission, onward)   None     Objective: Vitals: Today's Vitals   03/02/20 2355 03/03/20 0000 03/03/20 0313 03/03/20 0825  BP: 139/78  131/78 115/86  Pulse: 79  75 82  Resp: 18  17 18  Temp: 98.2 F (36.8 C)  98.4 F (36.9 C) (!) 97.5 F (36.4 C)  TempSrc: Oral  Oral Oral  SpO2: 95%  97% 96%  Weight:  77.7 kg    Height:      PainSc:        Intake/Output Summary (Last 24 hours) at 03/03/2020 0952 Last data filed at 03/03/2020 0600 Gross per 24 hour  Intake 1433.77 ml  Output 0 ml  Net 1433.77 ml   Filed Weights   03/01/20 2337 03/03/20 0000  Weight: 75.8 kg 77.7 kg   Weight change: 1.95 kg  Intake/Output from previous day: 09/20 0701 - 09/21 0700 In: 1433.8 [P.O.:240; I.V.:1193.8] Out: 0  Intake/Output this shift: No intake/output data recorded.  Examination:  General exam: AAO X3, ON ra, ,NAD, weak  appearing. HEENT:Oral mucosa moist, Ear/Nose WNL grossly,dentition normal. Respiratory system: bilaterally clear,no wheezing or crackles,no use of accessory muscle, non tender. Cardiovascular system: S1 & S2 +, regular, No JVD. Gastrointestinal system: Abdomen soft, NT,ND, BS+. Nervous System:Alert, awake, moving extremities and grossly nonfocal Extremities: LLE swollen and tender and mildly red,distal peripheral pulses palpable.  Skin: No rashes,no icterus. MSK: Normal muscle bulk,tone, power  Data Reviewed: I have personally reviewed following labs and imaging studies CBC: Recent Labs  Lab 02/25/20 1510 03/02/20 0105 03/03/20 0733  WBC 13.7* 14.5* 14.7*  NEUTROABS 8.2* 10.9*  --   HGB 11.8* 12.0* 12.0*  HCT 35.3* 36.7* 36.1*  MCV 93.6 95.3 93.3  PLT 346 327 932   Basic Metabolic Panel: Recent Labs  Lab 02/25/20 1510 03/02/20 0105 03/03/20 0733  NA 139 135 134*  K 4.1 5.1 4.8  CL 104 98 98  CO2 29 28 25   GLUCOSE 97 119* 121*  BUN 12 21 18   CREATININE 0.75 0.94 0.74  CALCIUM 9.6 9.3 9.3   GFR: Estimated Creatinine Clearance: 101 mL/min (by C-G formula based on SCr of 0.74 mg/dL). Liver Function Tests: Recent Labs  Lab 02/25/20 1510  AST 148*  ALT 352*  ALKPHOS 421*  BILITOT 0.3  PROT 7.1  ALBUMIN 3.9   No results for input(s): LIPASE, AMYLASE in the last 168 hours. No results for input(s): AMMONIA in the last 168 hours. Coagulation Profile: Recent Labs  Lab 03/02/20 0105  INR 1.0   Cardiac Enzymes: No results for input(s): CKTOTAL, CKMB, CKMBINDEX, TROPONINI in the last 168 hours. BNP (last 3 results) No results for input(s): PROBNP in the last 8760 hours. HbA1C: No results for input(s): HGBA1C in the last 72 hours. CBG: No results for input(s): GLUCAP in the last 168 hours. Lipid Profile: No results for input(s): CHOL, HDL, LDLCALC, TRIG, CHOLHDL, LDLDIRECT in the last 72 hours. Thyroid Function Tests: No results for input(s): TSH, T4TOTAL,  FREET4, T3FREE, THYROIDAB in the last 72 hours. Anemia Panel: No results for input(s): VITAMINB12, FOLATE, FERRITIN, TIBC, IRON, RETICCTPCT in the last 72 hours. Sepsis Labs: No results for input(s): PROCALCITON, LATICACIDVEN in the last 168 hours.  Recent Results (from the past 240 hour(s))  SARS Coronavirus 2 by RT PCR (hospital order, performed in [facility] hospital lab) Nasopharyngeal Nasopharyngeal Swab     Status: None   Collection Time: 03/02/20  1:05 AM   Specimen: Nasopharyngeal Swab  Result Value Ref Range Status   SARS Coronavirus 2 NEGATIVE NEGATIVE Final    Comment: (NOTE) SARS-CoV-2 target nucleic acids are NOT DETECTED.  The SARS-CoV-2 RNA is generally detectable in upper and lower respiratory specimens during the acute phase of infection. The lowest  concentration of SARS-CoV-2 viral copies this assay can detect is 250 copies / mL. A negative result does not preclude SARS-CoV-2 infection and should not be used as the sole basis for treatment or other patient management decisions.  A negative result may occur with improper specimen collection / handling, submission of specimen other than nasopharyngeal swab, presence of viral mutation(s) within the areas targeted by this assay, and inadequate number of viral copies (<250 copies / mL). A negative result must be combined with clinical observations, patient history, and epidemiological information.  Fact Sheet for Patients:   StrictlyIdeas.no  Fact Sheet for Healthcare Providers: BankingDealers.co.za  This test is not yet approved or  cleared by the [place] FDA and has been authorized for detection and/or diagnosis of SARS-CoV-2 by FDA under an Emergency Use Authorization (EUA).  This EUA will remain in effect (meaning this test can be used) for the duration of the COVID-19 declaration under Section 564(b)(1) of the Act, 21 U.S.C. section 360bbb-3(b)(1), unless the  authorization is terminated or revoked sooner.  Performed at [facility], [address] [place], [place] [address]      Radiology Studies: CT ANGIO AO+BIFEM W & OR WO CONTRAST  Result Date: 03/02/2020 CLINICAL DATA:  Left leg swelling and discoloration, known history of EXAM: CT ANGIOGRAPHY OF ABDOMINAL AORTA WITH ILIOFEMORAL RUNOFF TECHNIQUE: Multidetector CT imaging of the abdomen, pelvis and lower extremities was performed using the standard protocol during bolus administration of intravenous contrast. Multiplanar CT image reconstructions and MIPs were obtained to evaluate the vascular anatomy. CONTRAST:  176mL OMNIPAQUE IOHEXOL 350 MG/ML SOLN COMPARISON:  MRI of the lumbar spine from 02/20/2020, CT from 02/26/2020 FINDINGS: VASCULAR Aorta: Atherosclerotic calcifications are noted without aneurysmal dilatation or dissection. Celiac: Patent without evidence of aneurysm, dissection, vasculitis or significant stenosis. SMA: Patent without evidence of aneurysm, dissection, vasculitis or significant stenosis. Renals: Both renal arteries are patent without evidence of aneurysm, dissection, vasculitis, fibromuscular dysplasia or significant stenosis. IMA: Patent without evidence of aneurysm, dissection, vasculitis or significant stenosis. RIGHT Lower Extremity Iliacs: Atherosclerotic calcifications are noted without aneurysmal dilatation. Runoff: Common femoral and superficial femoral artery are within normal limits. Ostial artery and popliteal trifurcation are widely patent. Three-vessel runoff to the right ankle is noted. LEFT Lower Extremity Iliacs: Iliac artery is widely patent with mild atherosclerotic calcifications. No focal stenosis is seen. Runoff: Common femoral and superficial femoral arteries are widely patent. Popliteal artery demonstrates mild atherosclerotic calcifications. Popliteal trifurcation is patent with three-vessel runoff to the left foot. Veins: There is soft tissue mass  along the course of the left common iliac vein consistent with the known history. The venous system below this mass lesion is dilated when compared with the right-sided venous system. This contributes to the lower extremity swelling. Possibility of underlying thrombus deserves consideration and ultrasound evaluation is recommended. Review of the MIP images confirms the above findings. NON-VASCULAR Lower chest: Bibasilar atelectatic changes are noted. Emphysematous changes are seen as well. Hepatobiliary: No focal liver abnormality is seen. No gallstones, gallbladder wall thickening, or biliary dilatation. Pancreas: Unremarkable. No pancreatic ductal dilatation or surrounding inflammatory changes. Spleen: Normal in size without focal abnormality. Adrenals/Urinary Tract: Adrenal glands are within normal limits bilaterally. Kidneys demonstrate renal cysts bilaterally similar to that seen on prior CT examination. No obstructive changes are noted. Bladder is well distended. Stomach/Bowel: No obstructive or inflammatory changes of the colon are seen. The appendix is not well visualized although no inflammatory changes to suggest appendicitis are  seen. Small bowel and stomach are within normal limits. Lymphatic: No sizable lymphadenopathy is identified. Reproductive: Prostate is unremarkable. Other: Soft tissue mass is again noted along the left psoas muscle and iliacus muscle extending in the iliac fossa similar to that seen on prior MRI examination. This causes some mass effect upon the adjacent iliac vein with dilatation of the venous system inferiorly. Mild soft tissue edema is noted likely related to the decreased venous outflow. Possibility of underlying deep venous thrombosis is raised. Ultrasound may be helpful for further evaluation. Musculoskeletal: Known mass in the L4-5 L5-S1 region on the left. Some mild bony destruction in the sacrum is seen. The overall appearance is similar to that noted on prior MRI.  IMPRESSION: VASCULAR No acute arterial abnormality is noted. Considerable venous dilatation is noted below the level of the known mass lesion in the left hemipelvis likely related to localized compression. The possibility of underlying deep venous thrombosis deserves consideration and ultrasound may be helpful for further evaluation. NON-VASCULAR Left-sided paraspinal mass lesion causing mass effect and decreased outflow in the venous system on the left as described above. No other focal abnormality is noted. Electronically Signed   By: Inez Catalina M.D.   On: 03/02/2020 03:26   US Venous Img Lower Unilateral Left  Result Date: 03/02/2020 CLINICAL DATA:  Sudden onset left lower extremity edema. History bladder cancer and left retroperitoneal metastatic disease. EXAM: LEFT LOWER EXTREMITY VENOUS DOPPLER ULTRASOUND TECHNIQUE: Gray-scale sonography with graded compression, as well as color Doppler and duplex ultrasound were performed to evaluate the lower extremity deep venous systems from the level of the common femoral vein and including the common femoral, femoral, profunda femoral, popliteal and calf veins including the posterior tibial, peroneal and gastrocnemius veins when visible. The superficial great saphenous vein was also interrogated. Spectral Doppler was utilized to evaluate flow at rest and with distal augmentation maneuvers in the common femoral, femoral and popliteal veins. COMPARISON:  None. FINDINGS: Contralateral Common Femoral Vein: Respiratory phasicity is normal and symmetric with the symptomatic side. No evidence of thrombus. Normal compressibility. Common Femoral Vein: The common femoral vein is not compressible. The lumen is expanded and filled with heterogeneous low level internal echoes. No evidence of color flow on color Doppler imaging. Findings are consistent with acute occlusive DVT. Saphenofemoral Junction: Occlusive DVT extends into the saphenofemoral junction. Profunda Femoral  Vein: Occlusive DVT extends into the profunda femoral vein. Femoral Vein: Occlusive DVT extends into the femoral vein and continues throughout the thigh. Popliteal Vein: Occlusive DVT throughout the popliteal vein extending into the calf veins. Calf Veins: Occlusive DVT noted in the peroneal and posterior tibial veins. Superficial Great Saphenous Vein: No evidence of thrombus. Normal compressibility. Venous Reflux:  None. Other Findings: There appears to be occlusive DVT in the external iliac vein as well. IMPRESSION: Positive for extensive acute occlusive DVT throughout the left lower extremity and also likely within the external iliac vein in the pelvis. Electronically Signed   By: Jacqulynn Cadet M.D.   On: 03/02/2020 10:17     LOS: 1 day   Antonieta Pert, MD [facility]  03/03/2020, 9:52 AM

## 2020-03-04 ENCOUNTER — Other Ambulatory Visit: Payer: Self-pay

## 2020-03-04 LAB — COMPREHENSIVE METABOLIC PANEL
ALT: 38 U/L (ref 0–44)
AST: 19 U/L (ref 15–41)
Albumin: 2.5 g/dL — ABNORMAL LOW (ref 3.5–5.0)
Alkaline Phosphatase: 158 U/L — ABNORMAL HIGH (ref 38–126)
Anion gap: 8 (ref 5–15)
BUN: 13 mg/dL (ref 8–23)
CO2: 26 mmol/L (ref 22–32)
Calcium: 8.7 mg/dL — ABNORMAL LOW (ref 8.9–10.3)
Chloride: 100 mmol/L (ref 98–111)
Creatinine, Ser: 0.69 mg/dL (ref 0.61–1.24)
GFR calc Af Amer: >60 mL/min (ref >60)
GFR calc non Af Amer: >60 mL/min (ref >60)
Glucose, Bld: 131 mg/dL — ABNORMAL HIGH (ref 70–99)
Potassium: 4.1 mmol/L (ref 3.5–5.1)
Sodium: 134 mmol/L — ABNORMAL LOW (ref 135–145)
Total Bilirubin: 0.8 mg/dL (ref 0.3–1.2)
Total Protein: 6.2 g/dL — ABNORMAL LOW (ref 6.5–8.1)

## 2020-03-04 LAB — CBC
HCT: 31.7 % — ABNORMAL LOW (ref 39.0–52.0)
Hemoglobin: 10.6 g/dL — ABNORMAL LOW (ref 13.0–17.0)
MCH: 30.5 pg (ref 26.0–34.0)
MCHC: 33.4 g/dL (ref 30.0–36.0)
MCV: 91.4 fL (ref 80.0–100.0)
Platelets: 289 10*3/uL (ref 150–400)
RBC: 3.47 MIL/uL — ABNORMAL LOW (ref 4.22–5.81)
RDW: 14.8 % (ref 11.5–15.5)
WBC: 12.6 10*3/uL — ABNORMAL HIGH (ref 4.0–10.5)
nRBC: 0 % (ref 0.0–0.2)

## 2020-03-04 LAB — HEPARIN LEVEL (UNFRACTIONATED)
Heparin Unfractionated: 0.25 IU/mL — ABNORMAL LOW (ref 0.30–0.70)
Heparin Unfractionated: 0.26 IU/mL — ABNORMAL LOW (ref 0.30–0.70)
Heparin Unfractionated: 0.62 IU/mL (ref 0.30–0.70)

## 2020-03-04 MED ORDER — HEPARIN BOLUS VIA INFUSION
2500.0000 [IU] | Freq: Once | INTRAVENOUS | Status: AC
  Administered 2020-03-04: 2500 [IU] via INTRAVENOUS
  Filled 2020-03-04: qty 2500

## 2020-03-04 NOTE — Progress Notes (Signed)
ANTICOAGULATION CONSULT NOTE - Follow Up Consult  Pharmacy Consult for heparin Indication: DVT  Labs: Recent Labs    03/02/20 0105 03/02/20 1245 03/03/20 0733 03/03/20 1758 03/04/20 0337  HGB 12.0*  --  12.0*  --  10.6*  HCT 36.7*  --  36.1*  --  31.7*  PLT 327  --  336  --  289  LABPROT 12.7  --   --   --   --   INR 1.0  --   --   --   --   HEPARINUNFRC  --    < > 0.16* 0.20* 0.25*  CREATININE 0.94  --  0.74  --   --    < > = values in this interval not displayed.    Assessment: 64yo male remains subtherapeutic on heparin though increasing slowly; no gtt issues or signs of bleeding per RN.  Goal of Therapy:  Heparin level 0.3-0.7 units/ml   Plan:  Will increase heparin gtt by 10% to 2200 units/hr and check level in 6 hours.    Wynona Neat, PharmD, BCPS  03/04/2020,4:11 AM

## 2020-03-04 NOTE — Progress Notes (Signed)
ANTICOAGULATION CONSULT NOTE - Follow Up Consult  Pharmacy Consult for IV Heparin Indication: DVT  No Known Allergies  Patient Measurements: Height: 6' (182.9 cm) Weight: 79.6 kg (175 lb 8 oz) IBW/kg (Calculated) : 77.6 Heparin Dosing Weight: 75 kg  Vital Signs: Temp: 99.5 F (37.5 C) (09/22 1921) Temp Source: Oral (09/22 1921) BP: 116/71 (09/22 1921) Pulse Rate: 78 (09/22 1921)  Labs: Recent Labs    03/02/20 0105 03/02/20 1245 03/03/20 0733 03/03/20 1758 03/04/20 0337 03/04/20 1059 03/04/20 2219  HGB 12.0*  --  12.0*  --  10.6*  --   --   HCT 36.7*  --  36.1*  --  31.7*  --   --   PLT 327  --  336  --  289  --   --   LABPROT 12.7  --   --   --   --   --   --   INR 1.0  --   --   --   --   --   --   HEPARINUNFRC  --    < > 0.16*   < > 0.25* 0.26* 0.62  CREATININE 0.94  --  0.74  --  0.69  --   --    < > = values in this interval not displayed.    Estimated Creatinine Clearance: 101 mL/min (by C-G formula based on SCr of 0.69 mg/dL).   Medications:  Scheduled:  . aspirin EC  81 mg Oral Daily  . Chlorhexidine Gluconate Cloth  6 each Topical Daily  . fentaNYL  1 patch Transdermal Q48H  . gabapentin  600 mg Oral TID    Assessment: 64 yr old man with LLE swelling was found to have extensive LLE DVT. Pharmacy was consulted for heparin dosing.   Due to subtherapeutic heparin level earlier today (0.16 units/ml), pt rec'd heparin 2000 units IV bolus X 1 and heparin infusion was increased to 1800 units/hr. Pt is S/P CT-guided biopsy of infiltrative mass involving L hemipelvis around 12 noon today; heparin infusion was paused for the procedure and restarted at 1800 units/hr at 1400 this afternoon. Heparin level ~4 hrs after restarting heparin infusion was 0.20 units/ml, which is below the goal range for this pt. CBC stable. Per RN, no issues with IV or bleeding observed post-IR procedure.   Of note, pt had unwitnessed fall this afternoon; CT this evening showed no acute  intracranial abnormality or hemorrhage.  9/22 PM update:  Heparin level therapeutic this PM  Goal of Therapy:  Heparin level 0.3-0.7 units/ml Monitor platelets by anticoagulation protocol: Yes   Plan:  Cont heparin 2500 units/hr Confirmatory heparin level with AM labs  Narda Bonds, PharmD, [place] Pharmacist Phone: [phone]

## 2020-03-04 NOTE — Progress Notes (Signed)
Fentanyl patch removed and discarded in stericycle. New patch placed on left shoulder.

## 2020-03-04 NOTE — Progress Notes (Addendum)
  Progress Note    03/04/2020 8:42 AM * No surgery found *  Subjective:  Improved LLE per patient but still very tight L calf. Sitting in chair when I entered room. States he needs assistance transferring due to difficulty with weight bearing and tightness in L leg. Denies any pain   Vitals:   03/03/20 1920 03/04/20 0308  BP: 136/77 131/77  Pulse: 81 80  Resp: 15 18  Temp: 98.5 F (36.9 C) 99.2 F (37.3 C)  SpO2: 97% 93%   Physical Exam: Cardiac: regular  Lungs: non labored Extremities:  Left leg swelling, no pain to palpation, able to somewhat plantar and dorsiflex without discomfort, compartments soft, palpable DP, left foot warm, motor and sensation intact Neurologic: alert and oriented  CBC    Component Value Date/Time   WBC 12.6 (H) 03/04/2020 0337   RBC 3.47 (L) 03/04/2020 0337   HGB 10.6 (L) 03/04/2020 0337   HGB 11.8 (L) 02/25/2020 1510   HCT 31.7 (L) 03/04/2020 0337   PLT 289 03/04/2020 0337   PLT 346 02/25/2020 1510   MCV 91.4 03/04/2020 0337   MCH 30.5 03/04/2020 0337   MCHC 33.4 03/04/2020 0337   RDW 14.8 03/04/2020 0337   LYMPHSABS 1.2 03/02/2020 0105   MONOABS 1.1 (H) 03/02/2020 0105   EOSABS 1.1 (H) 03/02/2020 0105   BASOSABS 0.1 03/02/2020 0105    BMET    Component Value Date/Time   NA 134 (L) 03/04/2020 0337   K 4.1 03/04/2020 0337   CL 100 03/04/2020 0337   CO2 26 03/04/2020 0337   GLUCOSE 131 (H) 03/04/2020 0337   BUN 13 03/04/2020 0337   CREATININE 0.69 03/04/2020 0337   CREATININE 0.75 02/25/2020 1510   CALCIUM 8.7 (L) 03/04/2020 0337   GFRNONAA >60 03/04/2020 0337   GFRNONAA >60 02/25/2020 1510   GFRAA >60 03/04/2020 0337   GFRAA >60 02/25/2020 1510    INR    Component Value Date/Time   INR 1.0 03/02/2020 0105     Intake/Output Summary (Last 24 hours) at 03/04/2020 8588 Last data filed at 03/04/2020 0600 Gross per 24 hour  Intake 2248.27 ml  Output 1225 ml  Net 1023.27 ml     Assessment/Plan:  64 y.o. male with  extensive LLE  DVT. Subjectively the edema and discomfort in left leg is improving. Without any signs of phlegmasia. Palpable DP pulse. Continue to mobilize. Elevate LLE when in bed. Continue IV heparin. Pending CT biopsy results for RP mass. Further recommendations when final diagnosis is available.   DVT prophylaxis:  Heparin gtt   Karoline Caldwell, PA-C [facility] [phone] 03/04/2020 8:42 AM   Agree with above.  Leg slightly less edema in the thigh.  Will follow up biopsy results.  He can be dc'd to home from my standpoint will leave lovenox vs oral agent per Dr Marin Olp.  If this is a benign process not requiring resection may consider iliac vein stent but this can be done electively within the next 2 weeks.  Following  Ruta Hinds, MD [facility] [place] Office: [phone]

## 2020-03-04 NOTE — Progress Notes (Signed)
Brian Benton is doing okay.  I am absolutely impressed that radiology could do the biopsy of the left retroperitoneal mass yesterday.  Hopefully, we will have some pulmonary result today.  He says his left leg might be a little bit less swollen.  He sit in a chair this morning.  He continues on the heparin drip.  Pharmacy is doing a good job in managing the levels.  His labs show white cell count 12.6.  Hemoglobin 10.6.  Platelet count 289,000.  His BUN is 13 creatinine 0.69.  Calcium 8.7 with an albumin of 2.5.  Of note, his LFTs have normalized now.  What I saw in the office his LFTs were quite elevated.  He stopped the statin drug.  I have to believe that the statin drug was causing the elevated liver studies.  I still worry about him and postphlebitic issues from this thrombus.  I appreciate vascular surgery's input.  I know that they are on top of this.  Again I just worry that given that Brian Benton is incredibly active and athletic, that the left leg will cause him problems in the future and he will be limited with what he can do.  He told me this morning that he just would not want to have that happen and be confined as to his activities.  Again, hopefully will have some preliminary result today.  I think the real question is whether this mass is malignant or just fibrosis.  If it is malignant, is it a solid tumor or a lymphoma.  I would not think that he is ready to go home as of yet.  I know he would like to go home.  He will clearly need Lovenox once we know that he is not going to be having any invasive procedures.  He did have a CT of the head yesterday.  This was unremarkable.  I am appreciate the outstanding care that he is getting from all the staff on [address].  Lattie Haw, MD  Psalm 34:4

## 2020-03-04 NOTE — Progress Notes (Signed)
PROGRESS NOTE    Brian Benton  CVE:[id number] DOB: 1956-05-27 DOA: 03/01/2020 PCP: Aurea Graff, PA-C (Inactive)   Chief Complaint  Patient presents with  . Leg Swelling    Brief Narrative: 42 male with history of bladder cancer being followed at [facility] and found to have a left hemipelvis retroperitoneal mass and was being planned for biopsy but admitted with sudden left leg swelling pain 03/02/20. CT angio of abdomen pelvis showed left-sided paraspinal mass and decreased output in the venous system on the left, Doppler of the left leg showed extensive occlusive thrombus in the left extremity up to the external iliac vein, vascular surgery was consulted, patient was placed on heparin infusion and was admitted. Seen by VVS, IR, Oncology. 9/21- ct guided biopsy of left hemi pelvis retropreitoneal mass   Subjective: Sitting on the bedside chair.  He reports his leg feels much better today less swollen less painful and less tight.His daughter is at the bedside. Unwitnessed fall yesterday- ct head negative   Assessment & Plan:  Left hemipelvis infiltrating mass causing decreased output in the venous system:s/p 9/21 CT guided biopsy of left hemi pelvis retropreitoneal mass.Dr Marin Olp on board- awaiting biopsy results. He was being worked up as outpatient had MRI of the lumbar spine on 02/20/2020.  Acute deep vein thrombosis (DVT) of left lower extremity: 2/2 #1.Vascular surgery on board appreciate input, on heparin infusion, Vascular awaiting on biopsy results before deciding further.  Will be transitioned to Lovenox as per Onco at D/C.  Daughter reports she will be able to help with injection and also can teach.  Leukocytosis:Improving.Afebrile. likely reactive due to #1/2  Tobacco abuse:Smoking cessation prior to discharge.  Hypertension: BP well controlled.  He is not on medication.  History of bladder cancer in 2015 appears he had superficial bladder cancer that was treated with  intravesicular therapy  Abnormal LFTs: Has resolved since he is off his statin likely the cause.  Follow-up with PCP  DVT prophylaxis: HEPARIN GTT Code Status:   Code Status: Full Code  Family Communication: plan of care discussed with patient and his wife at bedside.  Status is: Inpatient Remains inpatient appropriate because:For management of acute DVT and left hemipelvis mass,  Dispo: The patient is from: Home              Anticipated d/c is to: Home              Anticipated d/c date is: 2 days              Patient currently is not medically stable to d/c.  Nutrition: Diet Order            Diet regular Room service appropriate? Yes; Fluid consistency: Thin  Diet effective now                 Body mass index is 23.8 kg/m.  Consultants:see note  Procedures: 03/02/20: DUPLEX: Positive for extensive acute occlusive DVT throughout the left lower extremity and also likely within the external iliac vein in the Pelvis  Microbiology:see note Blood Culture No results found for: SDES, SPECREQUEST, CULT, REPTSTATUS  Other culture-see note  Medications: Scheduled Meds: . aspirin EC  81 mg Oral Daily  . Chlorhexidine Gluconate Cloth  6 each Topical Daily  . fentaNYL  1 patch Transdermal Q48H  . gabapentin  600 mg Oral TID   Continuous Infusions: . heparin 2,200 Units/hr (03/04/20 0705)  . lactated ringers 100 mL/hr at 03/04/20 0013  Antimicrobials: Anti-infectives (From admission, onward)   None     Objective: Vitals: Today's Vitals   03/03/20 1920 03/03/20 1951 03/04/20 0128 03/04/20 0308  BP: 136/77   131/77  Pulse: 81   80  Resp: 15   18  Temp: 98.5 F (36.9 C)   99.2 F (37.3 C)  TempSrc: Oral   Oral  SpO2: 97%   93%  Weight:   79.6 kg   Height:      PainSc:  0-No pain      Intake/Output Summary (Last 24 hours) at 03/04/2020 0743 Last data filed at 03/04/2020 0600 Gross per 24 hour  Intake 2248.27 ml  Output 1225 ml  Net 1023.27 ml   Filed  Weights   03/01/20 2337 03/03/20 0000 03/04/20 0128  Weight: 75.8 kg 77.7 kg 79.6 kg   Weight change: 1.905 kg  Intake/Output from previous day: 09/21 0701 - 09/22 0700 In: 2248.3 [P.O.:120; I.V.:2128.3] Out: 1225 [Urine:1225] Intake/Output this shift: No intake/output data recorded.  Examination: General exam: AAOx3,NAD,weak appearing. HEENT:Oral mucosa moist, Ear/Nose WNL grossly, dentition normal. Respiratory system: bilaterally clearclear,no wheezing or crackles,no use of accessory muscle Cardiovascular system: S1 & S2 +, No JVD,. Gastrointestinal system: Abdomen soft, NT,ND, BS+ Nervous System:Alert, awake, moving extremities and grossly nonfocal Extremities: LLE slightly red swollen tender but appears to be improving compared to yesterday  Skin: No rashes,no icterus.left eye brow area with cur form fall MSK: Normal muscle bulk,tone, power  Data Reviewed: I have personally reviewed following labs and imaging studies CBC: Recent Labs  Lab 03/02/20 0105 03/03/20 0733 03/04/20 0337  WBC 14.5* 14.7* 12.6*  NEUTROABS 10.9*  --   --   HGB 12.0* 12.0* 10.6*  HCT 36.7* 36.1* 31.7*  MCV 95.3 93.3 91.4  PLT 327 336 884   Basic Metabolic Panel: Recent Labs  Lab 03/02/20 0105 03/03/20 0733 03/04/20 0337  NA 135 134* 134*  K 5.1 4.8 4.1  CL 98 98 100  CO2 28 25 26   GLUCOSE 119* 121* 131*  BUN 21 18 13   CREATININE 0.94 0.74 0.69  CALCIUM 9.3 9.3 8.7*   GFR: Estimated Creatinine Clearance: 101 mL/min (by C-G formula based on SCr of 0.69 mg/dL). Liver Function Tests: Recent Labs  Lab 03/04/20 0337  AST 19  ALT 38  ALKPHOS 158*  BILITOT 0.8  PROT 6.2*  ALBUMIN 2.5*   No results for input(s): LIPASE, AMYLASE in the last 168 hours. No results for input(s): AMMONIA in the last 168 hours. Coagulation Profile: Recent Labs  Lab 03/02/20 0105  INR 1.0   Cardiac Enzymes: No results for input(s): CKTOTAL, CKMB, CKMBINDEX, TROPONINI in the last 168 hours. BNP  (last 3 results) No results for input(s): PROBNP in the last 8760 hours. HbA1C: No results for input(s): HGBA1C in the last 72 hours. CBG: No results for input(s): GLUCAP in the last 168 hours. Lipid Profile: No results for input(s): CHOL, HDL, LDLCALC, TRIG, CHOLHDL, LDLDIRECT in the last 72 hours. Thyroid Function Tests: No results for input(s): TSH, T4TOTAL, FREET4, T3FREE, THYROIDAB in the last 72 hours. Anemia Panel: No results for input(s): VITAMINB12, FOLATE, FERRITIN, TIBC, IRON, RETICCTPCT in the last 72 hours. Sepsis Labs: No results for input(s): PROCALCITON, LATICACIDVEN in the last 168 hours.  Recent Results (from the past 240 hour(s))  SARS Coronavirus 2 by RT PCR (hospital order, performed in [facility] hospital lab) Nasopharyngeal Nasopharyngeal Swab     Status: None   Collection Time: 03/02/20  1:05 AM  Specimen: Nasopharyngeal Swab  Result Value Ref Range Status   SARS Coronavirus 2 NEGATIVE NEGATIVE Final    Comment: (NOTE) SARS-CoV-2 target nucleic acids are NOT DETECTED.  The SARS-CoV-2 RNA is generally detectable in upper and lower respiratory specimens during the acute phase of infection. The lowest concentration of SARS-CoV-2 viral copies this assay can detect is 250 copies / mL. A negative result does not preclude SARS-CoV-2 infection and should not be used as the sole basis for treatment or other patient management decisions.  A negative result may occur with improper specimen collection / handling, submission of specimen other than nasopharyngeal swab, presence of viral mutation(s) within the areas targeted by this assay, and inadequate number of viral copies (<250 copies / mL). A negative result must be combined with clinical observations, patient history, and epidemiological information.  Fact Sheet for Patients:   StrictlyIdeas.no  Fact Sheet for Healthcare Providers: BankingDealers.co.za  This  test is not yet approved or  cleared by the [place] FDA and has been authorized for detection and/or diagnosis of SARS-CoV-2 by FDA under an Emergency Use Authorization (EUA).  This EUA will remain in effect (meaning this test can be used) for the duration of the COVID-19 declaration under Section 564(b)(1) of the Act, 21 U.S.C. section 360bbb-3(b)(1), unless the authorization is terminated or revoked sooner.  Performed at [facility], [address] [place], [place] [address]      Radiology Studies: CT HEAD WO CONTRAST  Result Date: 03/03/2020 CLINICAL DATA:  Recent fall with headaches, initial encounter EXAM: CT HEAD WITHOUT CONTRAST TECHNIQUE: Contiguous axial images were obtained from the base of the skull through the vertex without intravenous contrast. COMPARISON:  None. FINDINGS: Brain: No evidence of acute infarction, hemorrhage, hydrocephalus, extra-axial collection or mass lesion/mass effect. Vascular: No hyperdense vessel or unexpected calcification. Skull: Normal. Negative for fracture or focal lesion. Sinuses/Orbits: No acute finding. Other: None. IMPRESSION: No acute intracranial abnormality noted. Electronically Signed   By: Inez Catalina M.D.   On: 03/03/2020 20:30   US Venous Img Lower Unilateral Left  Result Date: 03/02/2020 CLINICAL DATA:  Sudden onset left lower extremity edema. History bladder cancer and left retroperitoneal metastatic disease. EXAM: LEFT LOWER EXTREMITY VENOUS DOPPLER ULTRASOUND TECHNIQUE: Gray-scale sonography with graded compression, as well as color Doppler and duplex ultrasound were performed to evaluate the lower extremity deep venous systems from the level of the common femoral vein and including the common femoral, femoral, profunda femoral, popliteal and calf veins including the posterior tibial, peroneal and gastrocnemius veins when visible. The superficial great saphenous vein was also interrogated. Spectral Doppler was utilized  to evaluate flow at rest and with distal augmentation maneuvers in the common femoral, femoral and popliteal veins. COMPARISON:  None. FINDINGS: Contralateral Common Femoral Vein: Respiratory phasicity is normal and symmetric with the symptomatic side. No evidence of thrombus. Normal compressibility. Common Femoral Vein: The common femoral vein is not compressible. The lumen is expanded and filled with heterogeneous low level internal echoes. No evidence of color flow on color Doppler imaging. Findings are consistent with acute occlusive DVT. Saphenofemoral Junction: Occlusive DVT extends into the saphenofemoral junction. Profunda Femoral Vein: Occlusive DVT extends into the profunda femoral vein. Femoral Vein: Occlusive DVT extends into the femoral vein and continues throughout the thigh. Popliteal Vein: Occlusive DVT throughout the popliteal vein extending into the calf veins. Calf Veins: Occlusive DVT noted in the peroneal and posterior tibial veins. Superficial Great Saphenous Vein: No evidence of thrombus.  Normal compressibility. Venous Reflux:  None. Other Findings: There appears to be occlusive DVT in the external iliac vein as well. IMPRESSION: Positive for extensive acute occlusive DVT throughout the left lower extremity and also likely within the external iliac vein in the pelvis. Electronically Signed   By: Jacqulynn Cadet M.D.   On: 03/02/2020 10:17   CT Biopsy  Result Date: 03/03/2020 INDICATION: History of bladder cancer, now with infiltrative mass involving the left hemipelvis. Please perform CT-guided biopsy for tissue diagnostic purposes. EXAM: CT-GUIDED BIOPSY OF INFILTRATIVE MASS INVOLVING THE LEFT HEMIPELVIS COMPARISON:  CT the chest, abdomen pelvis-02/26/2020 MEDICATIONS: None. ANESTHESIA/SEDATION: Fentanyl 100 mcg IV; Versed 1.5 mg IV Sedation time: 15 minutes; The patient was continuously monitored during the procedure by the interventional radiology nurse under my direct supervision.  CONTRAST:  None. COMPLICATIONS: None immediate. PROCEDURE: Informed consent was obtained from the patient following an explanation of the procedure, risks, benefits and alternatives. A time out was performed prior to the initiation of the procedure. The patient was positioned prone on the CT table and a limited CT was performed for procedural planning demonstrating unchanged size and appearance of the infiltrative mass involving the left hemipelvis with dominant component measuring approximately 4.1 x 3.4 cm (image 26, series 2). The procedure was planned. The operative site was prepped and draped in the usual sterile fashion. Appropriate trajectory was confirmed with a 22 gauge spinal needle after the adjacent tissues were anesthetized with 1% Lidocaine with epinephrine. Under intermittent CT guidance, a 17 gauge coaxial needle was advanced into the peripheral aspect of the mass. Appropriate positioning was confirmed and 6 core needle biopsy samples were obtained with an 18 gauge core needle biopsy device. The co-axial needle was removed following administration of a Gel-Foam slurry and superficial hemostasis was achieved with manual compression. A limited postprocedural CT was negative for hemorrhage or additional complication. A dressing was placed. The patient tolerated the procedure well without immediate postprocedural complication. IMPRESSION: Technically successful CT guided core needle biopsy of infiltrative mass involving the left hemipelvis. Electronically Signed   By: Sandi Mariscal M.D.   On: 03/03/2020 12:23     LOS: 2 days   Antonieta Pert, MD [facility]  03/04/2020, 7:43 AM

## 2020-03-05 ENCOUNTER — Encounter: Payer: Self-pay | Admitting: *Deleted

## 2020-03-05 ENCOUNTER — Ambulatory Visit (HOSPITAL_COMMUNITY): Admission: RE | Admit: 2020-03-05 | Payer: 59 | Source: Ambulatory Visit

## 2020-03-05 DIAGNOSIS — R1900 Intra-abdominal and pelvic swelling, mass and lump, unspecified site: Secondary | ICD-10-CM

## 2020-03-05 LAB — CBC
HCT: 33.1 % — ABNORMAL LOW (ref 39.0–52.0)
Hemoglobin: 11.1 g/dL — ABNORMAL LOW (ref 13.0–17.0)
MCH: 31.3 pg (ref 26.0–34.0)
MCHC: 33.5 g/dL (ref 30.0–36.0)
MCV: 93.2 fL (ref 80.0–100.0)
Platelets: 330 10*3/uL (ref 150–400)
RBC: 3.55 MIL/uL — ABNORMAL LOW (ref 4.22–5.81)
RDW: 14.6 % (ref 11.5–15.5)
WBC: 10.9 10*3/uL — ABNORMAL HIGH (ref 4.0–10.5)
nRBC: 0 % (ref 0.0–0.2)

## 2020-03-05 LAB — SURGICAL PATHOLOGY

## 2020-03-05 LAB — HEPARIN LEVEL (UNFRACTIONATED): Heparin Unfractionated: 0.48 IU/mL (ref 0.30–0.70)

## 2020-03-05 MED ORDER — HYDROCODONE-ACETAMINOPHEN 10-325 MG PO TABS
1.0000 | ORAL_TABLET | Freq: Four times a day (QID) | ORAL | Status: DC | PRN
  Administered 2020-03-05 – 2020-03-06 (×3): 1 via ORAL
  Filled 2020-03-05 (×3): qty 1

## 2020-03-05 NOTE — Progress Notes (Signed)
  We are still awaiting the biopsy.  Hopefully, the biopsy results will be out today.  I will call pathology to see if they can give [place] some kind of preliminary.  He is still having a tough time get around much.  He is having pain in the left leg.  There is still quite a bit of swelling in the left leg.  He continues on the heparin infusion.  Pharmacy is doing a great job with managing the levels.  He has had no problems with cough or shortness of breath.  He fell yesterday.,  Sure if he lost his balance or not.  He sustained a laceration over the left eyebrow.  Thankfully, he did not have any more significant bleeding given that he is on heparin.  His appetite is not that great.  I think a lot of this is that he is not to keen on the hospital food.  We are definitely in the waiting mood right now.  Again, we have to see with the path results are from this biopsy.  I would not switch him over to Lovenox as of yet.  It is possible that the radiologist may not have gotten significant tissue for [place].  If so, he will need another biopsy or maybe even an open biopsy.  Lattie Haw, MD  Jude 1:2

## 2020-03-05 NOTE — Progress Notes (Signed)
PROGRESS NOTE    Brian Benton  JIR:[id number] DOB: 11-Mar-1956 DOA: 03/01/2020 PCP: Aurea Graff, PA-C (Inactive)   Chief Complaint  Patient presents with  . Leg Swelling    Brief Narrative: 39 male with history of bladder cancer being followed at [facility] and found to have a left hemipelvis retroperitoneal mass and was being followed by Dr. Marin Olp and was being planned for biopsy but admitted with sudden left leg swelling pain 03/02/20. CT angio of abdomen pelvis showed left-sided paraspinal mass and decreased output in the venous system on the left, Doppler of the left leg showed extensive occlusive thrombus in the left extremity up to the external iliac vein, vascular surgery was consulted, patient was placed on heparin infusion and was admitted. Seen by VVS, IR, Oncology. 9/21- ct guided biopsy of left hemi pelvis retropreitoneal mass   Subjective: Laying on the bed still having left leg swelling pain not controlled by oxycodone and getting IV Dilaudid.  Assessment & Plan:  Left hemipelvis infiltrating mass causing decreased output in the venous system:s/p 9/21 CT guided biopsy of left hemi pelvis retropreitoneal mass.Dr Marin Olp on board-biopsy still not back yet for further plan.    Acute deep vein thrombosis (DVT) of left lower extremity: 2/2 #1.continue heparin drip as per vascular and oncology.  Awaiting on biopsy before deciding for further treatment.  Continue IV Dilaudid as needed switch oxygen to hydrocodone for pain control. Will be transitioned to Lovenox as per Onco at D/C.  Daughter reports she will be able to help with injection and also can teach.  Leukocytosis: Downtrending.  Afebrile.  Likely due to #1 and 2.    Tobacco abuse:Smoking cessation prior to discharge.  Hypertension: BP well controlled.  Blood pressure somewhat low yesterday.  Monitor. Not on meds.  History of bladder cancer in 2015 appears he had superficial bladder cancer that was treated with  intravesicular therapy  Abnormal LFTs: Has resolved since he is off his statin likely the cause.  Follow-up with PCP  DVT prophylaxis: HEPARIN GTT Code Status:   Code Status: Full Code  Family Communication: plan of care discussed with patient and his wife at bedside.  Status is: Inpatient Remains inpatient appropriate because:For management of acute DVT and left hemipelvis mass,  Dispo: The patient is from: Home              Anticipated d/c is to: Home PT eval              Anticipated d/c date is: 2 days              Patient currently is not medically stable to d/c.  Nutrition: Diet Order            Diet regular Room service appropriate? Yes; Fluid consistency: Thin  Diet effective now                 Body mass index is 23.88 kg/m.  Consultants:see note  Procedures: 03/02/20: DUPLEX: Positive for extensive acute occlusive DVT throughout the left lower extremity and also likely within the external iliac vein in the Pelvis  Microbiology:see note Blood Culture No results found for: SDES, SPECREQUEST, CULT, REPTSTATUS  Other culture-see note  Medications: Scheduled Meds: . aspirin EC  81 mg Oral Daily  . Chlorhexidine Gluconate Cloth  6 each Topical Daily  . fentaNYL  1 patch Transdermal Q48H  . gabapentin  600 mg Oral TID   Continuous Infusions: . heparin 2,500 Units/hr (03/05/20 0456)  .  lactated ringers 100 mL/hr at 03/05/20 0809    Antimicrobials: Anti-infectives (From admission, onward)   None     Objective: Vitals: Today's Vitals   03/05/20 0810 03/05/20 0811 03/05/20 0842 03/05/20 1102  BP: 138/80     Pulse: 80     Resp:      Temp:      TempSrc:      SpO2:      Weight:      Height:      PainSc:  8  0-No pain 0-No pain    Intake/Output Summary (Last 24 hours) at 03/05/2020 1116 Last data filed at 03/05/2020 1103 Gross per 24 hour  Intake 4187.69 ml  Output 3550 ml  Net 637.69 ml   Filed Weights   03/03/20 0000 03/04/20 0128 03/05/20 0410   Weight: 77.7 kg 79.6 kg 79.9 kg   Weight change: 0.272 kg  Intake/Output from previous day: 09/22 0701 - 09/23 0700 In: 3320.9 [P.O.:720; I.V.:2600.9] Out: 3350 [Urine:3350] Intake/Output this shift: Total I/O In: 986.8 [P.O.:120; I.V.:866.8] Out: 700 [Urine:700]  Examination: General exam: AAOx3 , NAD, weak appearing. HEENT:Oral mucosa moist, Ear/Nose WNL grossly, dentition normal. Respiratory system: bilaterally CLEAR,no wheezing or crackles,no use of accessory muscle Cardiovascular system: S1 & S2 +, No JVD,. Gastrointestinal system: Abdomen soft, NT,ND, BS+ Nervous System:Alert, awake, moving extremities and grossly nonfocal Extremities: Left lower leg edematous tender swollen, distal peripheral pulses palpable.  Skin: No rashes,no icterus. MSK: Normal muscle bulk,tone, power   Data Reviewed: I have personally reviewed following labs and imaging studies CBC: Recent Labs  Lab 03/02/20 0105 03/03/20 0733 03/04/20 0337 03/05/20 0920  WBC 14.5* 14.7* 12.6* 10.9*  NEUTROABS 10.9*  --   --   --   HGB 12.0* 12.0* 10.6* 11.1*  HCT 36.7* 36.1* 31.7* 33.1*  MCV 95.3 93.3 91.4 93.2  PLT 327 336 289 762   Basic Metabolic Panel: Recent Labs  Lab 03/02/20 0105 03/03/20 0733 03/04/20 0337  NA 135 134* 134*  K 5.1 4.8 4.1  CL 98 98 100  CO2 28 25 26   GLUCOSE 119* 121* 131*  BUN 21 18 13   CREATININE 0.94 0.74 0.69  CALCIUM 9.3 9.3 8.7*   GFR: Estimated Creatinine Clearance: 101 mL/min (by C-G formula based on SCr of 0.69 mg/dL). Liver Function Tests: Recent Labs  Lab 03/04/20 0337  AST 19  ALT 38  ALKPHOS 158*  BILITOT 0.8  PROT 6.2*  ALBUMIN 2.5*   No results for input(s): LIPASE, AMYLASE in the last 168 hours. No results for input(s): AMMONIA in the last 168 hours. Coagulation Profile: Recent Labs  Lab 03/02/20 0105  INR 1.0   Cardiac Enzymes: No results for input(s): CKTOTAL, CKMB, CKMBINDEX, TROPONINI in the last 168 hours. BNP (last 3  results) No results for input(s): PROBNP in the last 8760 hours. HbA1C: No results for input(s): HGBA1C in the last 72 hours. CBG: No results for input(s): GLUCAP in the last 168 hours. Lipid Profile: No results for input(s): CHOL, HDL, LDLCALC, TRIG, CHOLHDL, LDLDIRECT in the last 72 hours. Thyroid Function Tests: No results for input(s): TSH, T4TOTAL, FREET4, T3FREE, THYROIDAB in the last 72 hours. Anemia Panel: No results for input(s): VITAMINB12, FOLATE, FERRITIN, TIBC, IRON, RETICCTPCT in the last 72 hours. Sepsis Labs: No results for input(s): PROCALCITON, LATICACIDVEN in the last 168 hours.  Recent Results (from the past 240 hour(s))  SARS Coronavirus 2 by RT PCR (hospital order, performed in [facility] hospital lab) Nasopharyngeal Nasopharyngeal Swab  Status: None   Collection Time: 03/02/20  1:05 AM   Specimen: Nasopharyngeal Swab  Result Value Ref Range Status   SARS Coronavirus 2 NEGATIVE NEGATIVE Final    Comment: (NOTE) SARS-CoV-2 target nucleic acids are NOT DETECTED.  The SARS-CoV-2 RNA is generally detectable in upper and lower respiratory specimens during the acute phase of infection. The lowest concentration of SARS-CoV-2 viral copies this assay can detect is 250 copies / mL. A negative result does not preclude SARS-CoV-2 infection and should not be used as the sole basis for treatment or other patient management decisions.  A negative result may occur with improper specimen collection / handling, submission of specimen other than nasopharyngeal swab, presence of viral mutation(s) within the areas targeted by this assay, and inadequate number of viral copies (<250 copies / mL). A negative result must be combined with clinical observations, patient history, and epidemiological information.  Fact Sheet for Patients:   StrictlyIdeas.no  Fact Sheet for Healthcare Providers: BankingDealers.co.za  This test is not  yet approved or  cleared by the [place] FDA and has been authorized for detection and/or diagnosis of SARS-CoV-2 by FDA under an Emergency Use Authorization (EUA).  This EUA will remain in effect (meaning this test can be used) for the duration of the COVID-19 declaration under Section 564(b)(1) of the Act, 21 U.S.C. section 360bbb-3(b)(1), unless the authorization is terminated or revoked sooner.  Performed at [facility], [address] [place], [place] [address]      Radiology Studies: CT HEAD WO CONTRAST  Result Date: 03/03/2020 CLINICAL DATA:  Recent fall with headaches, initial encounter EXAM: CT HEAD WITHOUT CONTRAST TECHNIQUE: Contiguous axial images were obtained from the base of the skull through the vertex without intravenous contrast. COMPARISON:  None. FINDINGS: Brain: No evidence of acute infarction, hemorrhage, hydrocephalus, extra-axial collection or mass lesion/mass effect. Vascular: No hyperdense vessel or unexpected calcification. Skull: Normal. Negative for fracture or focal lesion. Sinuses/Orbits: No acute finding. Other: None. IMPRESSION: No acute intracranial abnormality noted. Electronically Signed   By: Inez Catalina M.D.   On: 03/03/2020 20:30   CT Biopsy  Result Date: 03/03/2020 INDICATION: History of bladder cancer, now with infiltrative mass involving the left hemipelvis. Please perform CT-guided biopsy for tissue diagnostic purposes. EXAM: CT-GUIDED BIOPSY OF INFILTRATIVE MASS INVOLVING THE LEFT HEMIPELVIS COMPARISON:  CT the chest, abdomen pelvis-02/26/2020 MEDICATIONS: None. ANESTHESIA/SEDATION: Fentanyl 100 mcg IV; Versed 1.5 mg IV Sedation time: 15 minutes; The patient was continuously monitored during the procedure by the interventional radiology nurse under my direct supervision. CONTRAST:  None. COMPLICATIONS: None immediate. PROCEDURE: Informed consent was obtained from the patient following an explanation of the procedure, risks, benefits  and alternatives. A time out was performed prior to the initiation of the procedure. The patient was positioned prone on the CT table and a limited CT was performed for procedural planning demonstrating unchanged size and appearance of the infiltrative mass involving the left hemipelvis with dominant component measuring approximately 4.1 x 3.4 cm (image 26, series 2). The procedure was planned. The operative site was prepped and draped in the usual sterile fashion. Appropriate trajectory was confirmed with a 22 gauge spinal needle after the adjacent tissues were anesthetized with 1% Lidocaine with epinephrine. Under intermittent CT guidance, a 17 gauge coaxial needle was advanced into the peripheral aspect of the mass. Appropriate positioning was confirmed and 6 core needle biopsy samples were obtained with an 18 gauge core needle biopsy device. The co-axial needle was  removed following administration of a Gel-Foam slurry and superficial hemostasis was achieved with manual compression. A limited postprocedural CT was negative for hemorrhage or additional complication. A dressing was placed. The patient tolerated the procedure well without immediate postprocedural complication. IMPRESSION: Technically successful CT guided core needle biopsy of infiltrative mass involving the left hemipelvis. Electronically Signed   By: Sandi Mariscal M.D.   On: 03/03/2020 12:23     LOS: 3 days   Antonieta Pert, MD [facility]  03/05/2020, 11:16 AM

## 2020-03-05 NOTE — Progress Notes (Signed)
Awaiting biopsy results Will follow up after diagnosis regarding possible iliac vein intervention  Ruta Hinds, MD [facility] [place] Office: [phone]

## 2020-03-05 NOTE — Progress Notes (Signed)
ANTICOAGULATION CONSULT NOTE - Follow Up Consult  Pharmacy Consult for IV Heparin Indication: DVT  No Known Allergies  Patient Measurements: Height: 6' (182.9 cm) Weight: 79.9 kg (176 lb 1.6 oz) (scale a) IBW/kg (Calculated) : 77.6 Heparin Dosing Weight: 75 kg  Vital Signs: Temp: 98.9 F (37.2 C) (09/23 0410) Temp Source: Oral (09/23 0410) BP: 138/80 (09/23 0810) Pulse Rate: 80 (09/23 0810)  Labs: Recent Labs    03/03/20 0733 03/03/20 1758 03/04/20 0337 03/04/20 0337 03/04/20 1059 03/04/20 2219 03/05/20 0920  HGB 12.0*  --  10.6*  --   --   --  11.1*  HCT 36.1*  --  31.7*  --   --   --  33.1*  PLT 336  --  289  --   --   --  330  HEPARINUNFRC 0.16*   < > 0.25*   < > 0.26* 0.62 0.48  CREATININE 0.74  --  0.69  --   --   --   --    < > = values in this interval not displayed.    Estimated Creatinine Clearance: 101 mL/min (by C-G formula based on SCr of 0.69 mg/dL).   Medications:  Scheduled:  . aspirin EC  81 mg Oral Daily  . Chlorhexidine Gluconate Cloth  6 each Topical Daily  . fentaNYL  1 patch Transdermal Q48H  . gabapentin  600 mg Oral TID    Assessment: 64 yr old man with LLE swelling was found to have extensive LLE DVT. Pharmacy was consulted for heparin dosing.   Due to subtherapeutic heparin level earlier today (0.16 units/ml), pt rec'd heparin 2000 units IV bolus X 1 and heparin infusion was increased to 1800 units/hr. Pt is S/P CT-guided biopsy of infiltrative mass involving L hemipelvis 9/21. Of note, pt had unwitnessed fall later that evening; CT this evening showed no acute intracranial abnormality or hemorrhage.  Heparin level came back therapeutic at 0.48, on 2500 units/hr. Hgb 11.1, plt 330. No s/sx of bleeding or infusion issues.   Goal of Therapy:  Heparin level 0.3-0.7 units/ml Monitor platelets by anticoagulation protocol: Yes   Plan:  Continue heparin infusion at 2500 units/hr Monitor daily HL, CBC, and for s/sx of bleeding    Antonietta Jewel, PharmD, [place] Pharmacist  Phone: [phone] 03/05/2020 10:02 AM  Please check AMION for all [place] phone numbers After 10:00 PM, call [place] [phone]

## 2020-03-05 NOTE — Progress Notes (Signed)
Patient had a recent fall, refusing bed alarm. Patient educated prior, however still refusing.

## 2020-03-05 NOTE — Progress Notes (Signed)
  Mobility Specialist Criteria Algorithm Info.  Mobility Team: [facility] elevated:Self regulated Activity: Ambulated in room (LE exercises and stretches; Edu/practice on rollator) Range of motion: Active;All extremities Level of assistance: Standby assist, set-up cues, supervision of patient - no hands on Assistive device: Four wheel walker Minutes sitting in chair:  Minutes stood: 4 minutes Minutes ambulated: 4 minutes Distance ambulated (ft): 20 ft (10+10) Mobility response: RN notified;Tolerated well (Required seated rest x1) Bed Position: Chair (Recliner chair)  Received patient sitting in recliner chair, very eager to participate in mobility this morning reporting that he was very active before admission. Daughter also at the bedside has been helping patient at home. Per report from daughter, pt has been limited d/t LLE weakness and now swelling has significantly effected his mobility and ROM. He also has been limited by pain which at this time is 2/10. Before ambulation, completed education on Rollator and proper mechanics for safety. Patient stood and ambulated in room 52ft w/supervision and TWB on LLE before needing seated rest. Also required cues for hand placement and sequencing. Patient ambulated back to recliner chair declining further distance for unspecified reasons. Once seated in recliner we completed LE strength exercises/stretches. Overall pt did well but would benefit from evaluation of Acute Therapy Services. Patient would also like to speak with CM about Home Health and obtaining a Rollator and shower chair.   03/05/2020 3:06 PM

## 2020-03-05 NOTE — Progress Notes (Signed)
  Progress Note    03/05/2020 8:04 AM * No surgery found *  Subjective:  Promary complaint is some soreness from bx and having to lay prone   Vitals:   03/04/20 1921 03/05/20 0410  BP: 116/71 131/72  Pulse: 78 79  Resp: 15 18  Temp: 99.5 F (37.5 C) 98.9 F (37.2 C)  SpO2: 95% 96%    Physical Exam: Cardiac:  RRR Lungs:  nonlabored Extremities:  LLE edema. Motor function and sensation intact. Brisk triphasic DP and PT doppler signals. Both forefeet cool to touch   CBC    Component Value Date/Time   WBC 12.6 (H) 03/04/2020 0337   RBC 3.47 (L) 03/04/2020 0337   HGB 10.6 (L) 03/04/2020 0337   HGB 11.8 (L) 02/25/2020 1510   HCT 31.7 (L) 03/04/2020 0337   PLT 289 03/04/2020 0337   PLT 346 02/25/2020 1510   MCV 91.4 03/04/2020 0337   MCH 30.5 03/04/2020 0337   MCHC 33.4 03/04/2020 0337   RDW 14.8 03/04/2020 0337   LYMPHSABS 1.2 03/02/2020 0105   MONOABS 1.1 (H) 03/02/2020 0105   EOSABS 1.1 (H) 03/02/2020 0105   BASOSABS 0.1 03/02/2020 0105    BMET    Component Value Date/Time   NA 134 (L) 03/04/2020 0337   K 4.1 03/04/2020 0337   CL 100 03/04/2020 0337   CO2 26 03/04/2020 0337   GLUCOSE 131 (H) 03/04/2020 0337   BUN 13 03/04/2020 0337   CREATININE 0.69 03/04/2020 0337   CREATININE 0.75 02/25/2020 1510   CALCIUM 8.7 (L) 03/04/2020 0337   GFRNONAA >60 03/04/2020 0337   GFRNONAA >60 02/25/2020 1510   GFRAA >60 03/04/2020 0337   GFRAA >60 02/25/2020 1510     Intake/Output Summary (Last 24 hours) at 03/05/2020 0804 Last data filed at 03/05/2020 6734 Gross per 24 hour  Intake 3320.85 ml  Output 3350 ml  Net -29.15 ml    HOSPITAL MEDICATIONS Scheduled Meds: . aspirin EC  81 mg Oral Daily  . Chlorhexidine Gluconate Cloth  6 each Topical Daily  . fentaNYL  1 patch Transdermal Q48H  . gabapentin  600 mg Oral TID   Continuous Infusions: . heparin 2,500 Units/hr (03/05/20 0456)  . lactated ringers 100 mL/hr at 03/05/20 0353   PRN Meds:.acetaminophen  **OR** acetaminophen, HYDROmorphone (DILAUDID) injection, ondansetron **OR** ondansetron (ZOFRAN) IV, oxyCODONE, senna-docusate, temazepam  Assessment: 64 y.o. male with extensive LLE  DVT.  No signs of phlegmasia. S/p bx yesterday of RP mass.   Plan: -Continue current treatment plan and monitoring. Follow-up bx results. Heparin gtt continues. Plt count normal. -DVT prophylaxis:  heparin infusion   Risa Grill, PA-C [facility] [phone] 03/05/2020  8:04 AM

## 2020-03-05 NOTE — Progress Notes (Signed)
Patient has been admitted to the hospital and had biopsy completed while inpatient. 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 Per Dr Antonieta Pert request, Oncotype MAP testing requested for:  Specimen [id number]  DOS 03/03/2020  Patient will also need referral to Radiation Oncology. Dr Marin Olp has spoken to Dr Sondra Come.   Oncology Nurse Navigator Documentation  Oncology Nurse Navigator Flowsheets 03/05/2020  Abnormal Finding Date -  Confirmed Diagnosis Date 03/03/2020  Diagnosis Status Confirmed Diagnosis Complete  Navigator Follow Up Date: -  Navigator Follow Up Reason: -  Navigator Location [facility]  Referral Date to RadOnc/MedOnc -  Navigator Encounter Type Molecular Studies;Appt/Treatment Plan Review  Telephone -  Patient Visit Type MedOnc  Treatment Phase Pre-Tx/Tx Discussion  Barriers/Navigation Needs Coordination of Care;Education  Education -  Interventions Other;Referrals  Acuity Level 2-Minimal Needs (1-2 Barriers Identified)  Referrals Radiation Oncology  Coordination of Care -  Education Method -  Support Groups/Services Friends and Family  Time Spent with Patient 28

## 2020-03-05 NOTE — Progress Notes (Addendum)
Patient's case discussed with Dr. Marin Olp.  Area of obstruction of the iliac vein is consistent with metastatic bladder cancer.  I have discussed this with my partners Dr. Gwenlyn Saran and Dr. Trula Slade who agree it is probably worthwhile to try to palliate him with iliac venous stenting with the potential complication being bleeding if the vessel tears.  Will discuss with patient whether or not he wishes to consider possible iliac vein stenting for palliation of his left leg swelling.  Primary risk would be of bleeding or potential rupture of the iliac vein.  I will discuss this with the patient to see if he wishes to proceed.  If he wishes to go forward with the procedure we could schedule this for Friday, March 13, 2020.  Ruta Hinds, MD [facility] [place] Office: [phone]  Spoke with pt by phone.  Told him of the bladder cancer recurrence.  I discussed with him that Dr. Marin Olp would be going over possible treatment options for his cancer.  I also discussed with the patient today the possibility of potentially trying to stent his iliac vein to increase venous outflow.  I discussed with him the risks benefits options regarding this.  Option 1 would include continuing his anticoagulation with the possibility that he may collateralize over time and improve the swelling in his left leg but probably not completely returned to baseline.  The other option would be an intervention to try to open the iliac vein with angioplasty or stenting with risk of potentially rupturing the vein with bleeding.  Overall the risk of this should be fairly low but if it does occur it could be significant potentially life-threatening bleeding.  He is going to discuss this further with his family as well as Dr. Marin Olp.  I will discuss further with the patient tomorrow.  Ruta Hinds, MD [facility] [place] Office: [phone]

## 2020-03-06 ENCOUNTER — Encounter: Payer: Self-pay | Admitting: *Deleted

## 2020-03-06 ENCOUNTER — Other Ambulatory Visit (HOSPITAL_COMMUNITY): Payer: Self-pay | Admitting: Internal Medicine

## 2020-03-06 ENCOUNTER — Encounter (HOSPITAL_COMMUNITY): Payer: Self-pay | Admitting: Family Medicine

## 2020-03-06 DIAGNOSIS — C679 Malignant neoplasm of bladder, unspecified: Secondary | ICD-10-CM

## 2020-03-06 DIAGNOSIS — I82402 Acute embolism and thrombosis of unspecified deep veins of left lower extremity: Secondary | ICD-10-CM

## 2020-03-06 DIAGNOSIS — C799 Secondary malignant neoplasm of unspecified site: Secondary | ICD-10-CM

## 2020-03-06 HISTORY — DX: Malignant neoplasm of bladder, unspecified: C67.9

## 2020-03-06 HISTORY — DX: Secondary malignant neoplasm of unspecified site: C79.9

## 2020-03-06 MED ORDER — ENOXAPARIN SODIUM 80 MG/0.8ML ~~LOC~~ SOLN
1.0000 mg/kg | Freq: Two times a day (BID) | SUBCUTANEOUS | Status: DC
  Administered 2020-03-06: 80 mg via SUBCUTANEOUS
  Filled 2020-03-06: qty 0.8

## 2020-03-06 MED ORDER — ENOXAPARIN SODIUM 80 MG/0.8ML ~~LOC~~ SOLN: 1.0000 mg/kg | Freq: Two times a day (BID) | SUBCUTANEOUS | 0 refills | Status: DC

## 2020-03-06 MED FILL — ENOXAPARIN SODIUM 80 MG/0.8: 80 | 12 days supply | Qty: 19 | Fill #0

## 2020-03-06 NOTE — Progress Notes (Signed)
Shockingly, the pathology report shows that this is metastatic bladder cancer.  He only had superficial bladder cancer back in 2015.  This was treated with intravesicular BCG.  He gets routine follow-up by urology.  From the studies so far, it looks like this is just a solitary area of recurrence.  We will see about a PET scan as an outpatient.  I would think though that the PET scan would not show [place] anything elsewhere.  Bladder cancer is highly sensitive to radiation and chemotherapy.  Since he has had neither yet, I feel very confident that we can induce significant tumor shrinkage.  I probably would do radiation therapy upfront.  This will do the "bulk" of the workforce.  I would use radiosensitizing chemotherapy to allow radiation therapy to work better.  After radiation therapy is completed, then I will use full dose chemotherapy to try to eradicate any micrometastatic disease that he may have.  Now, the FDA has approved the use of immunotherapy after chemotherapy for maintenance treatment for metastatic bladder cancer.  I have sent the tumor specimen off for molecular studies.  We will see if there is any molecular/genetic aberration that we might be able to target.  I talked to Brian Benton about all this.  I have already spoken Brian Benton of Radiation Oncology and he will see him on Tuesday I think.  I very much appreciate the efforts by Brian Benton of Vascular Surgery.  It would be great if a iliac stent could be placed.  I really think this might be able to provide some relief for the left leg swelling.  I think this will allow Brian Benton to have a better quality of life long-term as I really think that we can get him several years given that this is a localized recurrence of bladder cancer.  I spoke to Brian Benton this morning.  He is clearly in great shape for aggressive therapy.  I'm not sure there is still any surgical option after we do therapy on him.  I would not think so given the  current scan findings.  I will try to get him on to Lovenox now.  I do not anticipate any invasive studies.  He has great peripheral IV access so he does not need a Port-A-Cath.  He will need twice a day Lovenox.  Ultimately, we might be able to get him onto something oral once we decrease the thrombus burden.  His labs today are still pending.  He really wants to go home today.  I think he should be able to go home today.  His daughter who is up from [place] is a nurse so she can certainly do the Lovenox.  We need to make sure that Lovenox is covered by the insurance.  He has been very complementary of the care he is received on 3 E.  I'm not surprised by this.  The staff are incredibly compassionate and show a lot of professionalism and caring for my patients whenever they are up on the floor.  Brian Haw, MD  [place] 14:14

## 2020-03-06 NOTE — TOC Transition Note (Signed)
Transition of Care [facility]) - CM/SW Discharge Note   Patient Details  Name: Brian Benton MRN: [id number] Date of Birth: 1956-02-13  Transition of Care [facility]) CM/SW Contact:  Zenon Mayo, RN Phone Number: 03/06/2020, 1:47 PM   Clinical Narrative:    Patient is for dc today, daughter will do the lovenox injections for patient and then he will be doing them himself, he states he does not need a HHRN for that.  He also states will do outpatient physical therapy, MD wrote script for patient to do outpt pt.  NCM will call them to notify.    Final next level of care: Home/Self Care Barriers to Discharge: No Barriers Identified   Patient Goals and CMS Choice Patient states their goals for this hospitalization and ongoing recovery are:: get better   Choice offered to / list presented to : NA  Discharge Placement                       Discharge Plan and Services                  DME Agency: NA       HH Arranged: NA          Social Determinants of Health (SDOH) Interventions     Readmission Risk Interventions No flowsheet data found.

## 2020-03-06 NOTE — Evaluation (Signed)
Physical Therapy Evaluation Patient Details Name: Brian Benton MRN: [id number] DOB: 03-Nov-1955 Today's Date: 03/06/2020   History of Present Illness  64 y.o. male with medical history significant for history of bladder cancer, HTN, HLD who presented to [facility] ER with complaint of left leg swelling and pain. CT angio of abdomen pelvis showed left-sided paraspinal mass and decreased output in the venous system on the left, Doppler of the left leg showed extensive occlusive thrombus in the left extremity up to the external iliac vein Pt admitted 03/01/20 for Left hemipelvis infiltrating mass causing decreased output in the venous system and Acute deep vein thrombosis (DVT) of left lower extremity. IR biopsy 03/05/20  Clinical Impression  PTA pt living with wife in multistory home with bed and bath on main level. Pt reports household distance ambulation with crutches, independence in ADLs and iADLs, and driving. Pt is currently limited in mobility by increased L LE pain, decreased ROM, decreased L LE strength and decreased endurance. Pt requires min guard for transfers and light min A for ambulation with Rollator. Pt with decreased weightbearing through L LE and decreased heelstrike with ambulation, attempted to improve gait with Rollator however pt unable to achieve good tricep extension to grade weightbearing. PT suggested RW for improved gait training, pt and daughter in agreement. PT recommending HHPT to work on gait training. PT will continue to follow acutely.     Follow Up Recommendations Home health PT;Supervision for mobility/OOB    Equipment Recommendations  Rolling walker with 5" wheels;3in1 (PT)       Precautions / Restrictions Precautions Precautions: Fall Restrictions Weight Bearing Restrictions: No      Mobility  Bed Mobility               General bed mobility comments: sitting up in chair  Transfers Overall transfer level: Needs assistance Equipment used: 4-wheeled  walker Transfers: Sit to/from Stand Sit to Stand: Min guard         General transfer comment: min guard for safety, vc for sequencing  Ambulation/Gait Ambulation/Gait assistance: Min assist Gait Distance (Feet): 10 Feet Assistive device: 4-wheeled walker Gait Pattern/deviations: Step-through pattern;Decreased dorsiflexion - left;Decreased step length - right;Decreased stance time - left;Antalgic;Trunk flexed Gait velocity: slowed   General Gait Details: utilized Rollator, pt with step to pattern lacking L LE full swing through, heel strike and weightbearing pt only able to take 6 steps due to increase in pain, educated in how to return to chair without backing up for 5 feet         Balance Overall balance assessment: Needs assistance Sitting-balance support: Feet supported;No upper extremity supported Sitting balance-Leahy Scale: Good     Standing balance support: Bilateral upper extremity supported;Single extremity supported Standing balance-Leahy Scale: Poor Standing balance comment: requires at least single UE support to maintain balance                              Pertinent Vitals/Pain Pain Assessment: 0-10 Pain Score: 2  Pain Location: L hip  Pain Descriptors / Indicators: Aching;Sore Pain Intervention(s): Limited activity within patient's tolerance;Monitored during session;Repositioned    Home Living Family/patient expects to be discharged to:: Private residence Living Arrangements: Spouse/significant other Available Help at Discharge: Family;Available 24 hours/day Type of Home: House Home Access: Stairs to enter Entrance Stairs-Rails: None Entrance Stairs-Number of Steps: 2 Home Layout: Two level;Able to live on main level with bedroom/bathroom Home Equipment: Crutches;Hand held shower head  Prior Function Level of Independence: Independent with assistive device(s)               Hand Dominance        Extremity/Trunk Assessment    Upper Extremity Assessment Upper Extremity Assessment: Overall WFL for tasks assessed    Lower Extremity Assessment Lower Extremity Assessment: LLE deficits/detail LLE Deficits / Details: AROM hip lacking full extension, knee lacking full ext/flex, AAROM of ankle lacking neutral dorsiflexion strength in hip 2+/5, knee 3/5, ankle 2/5       Communication   Communication: No difficulties  Cognition Arousal/Alertness: Awake/alert Behavior During Therapy: WFL for tasks assessed/performed Overall Cognitive Status: Within Functional Limits for tasks assessed                                        General Comments General comments (skin integrity, edema, etc.): L LE edema, and stiffness, daughter in room during session. VSS on RA    Exercises General Exercises - Lower Extremity Ankle Circles/Pumps: Left;10 reps Heel Slides: AROM;Left;10 reps;Seated Hip Flexion/Marching: AROM;5 reps;Seated;AAROM;10 reps Heel Raises: AROM;Left;10 reps Other Exercises Other Exercises: 5 x 10 sec hold of dorsiflexion stretch using sheet   Assessment/Plan    PT Assessment Patient needs continued PT services  PT Problem List Decreased strength;Decreased range of motion;Decreased activity tolerance;Decreased balance;Decreased mobility;Decreased coordination;Decreased knowledge of use of DME;Decreased safety awareness;Cardiopulmonary status limiting activity;Pain       PT Treatment Interventions DME instruction;Gait training;Stair training;Functional mobility training;Therapeutic activities;Therapeutic exercise;Balance training;Cognitive remediation;Patient/family education    PT Goals (Current goals can be found in the Care Plan section)  Acute Rehab PT Goals Patient Stated Goal: be able to walk  PT Goal Formulation: With patient/family Time For Goal Achievement: 03/20/20 Potential to Achieve Goals: Good    Frequency Min 3X/week    AM-PAC PT "6 Clicks" Mobility  Outcome Measure  Help needed turning from your back to your side while in a flat bed without using bedrails?: None Help needed moving from lying on your back to sitting on the side of a flat bed without using bedrails?: None Help needed moving to and from a bed to a chair (including a wheelchair)?: None Help needed standing up from a chair using your arms (e.g., wheelchair or bedside chair)?: None Help needed to walk in hospital room?: A Little Help needed climbing 3-5 steps with a railing? : A Little 6 Click Score: 22    End of Session Equipment Utilized During Treatment: Gait belt Activity Tolerance: Patient limited by pain Patient left: in chair;with call bell/phone within reach;with family/visitor present Nurse Communication: Mobility status PT Visit Diagnosis: Unsteadiness on feet (R26.81);Other abnormalities of gait and mobility (R26.89);Muscle weakness (generalized) (M62.81);History of falling (Z91.81);Difficulty in walking, not elsewhere classified (R26.2);Pain Pain - Right/Left: Left Pain - part of body: Hip    Time: 9924-2683 PT Time Calculation (min) (ACUTE ONLY): 37 min   Charges:   PT Evaluation $PT Eval Moderate Complexity: 1 Mod PT Treatments $Therapeutic Exercise: 8-22 mins        Dayvon Dax B. Migdalia Dk PT, DPT Acute Rehabilitation Services Pager [phone] Office [phone]   [place] 03/06/2020, 10:30 AM

## 2020-03-06 NOTE — Consult Note (Signed)
Patient feels better Physical exam Left leg improved still about 50% larger than the right leg   discussed with patient today the possibility of left iliac vein stenting.  I discussed with him the risks benefits possible complications and procedure details including but not limited to bleeding infection possible vessel rupture with bleeding that could potentially cause death although this is overall low risk.  Patient states that he would like to proceed with iliac stenting.  We will schedule this for March 13, 2020, next Friday.  The patient will call me middle of the week if his left leg has returned to normal size prior to this.  Ruta Hinds, MD [facility] Office: [phone]

## 2020-03-06 NOTE — Progress Notes (Signed)
Discharge and medication education given with teach back. Peripheral iv removed.  Pt received meds from [place].  Pt was transported in wheelchair to main entrance.

## 2020-03-06 NOTE — Discharge Summary (Signed)
Physician Discharge Summary  Brian Benton [facility]GH:[id number] DOB: [birth year] DOA: 03/01/2020  PCP: Aurea Graff, PA-C (Inactive)  Admit date: 03/01/2020 Discharge date: 03/06/2020  Admitted From: home Disposition:  home  Recommendations for Outpatient Follow-up:  1. Follow up with PCP in 1-2 weeks 2. Please obtain BMP/CBC in one week 3. Please follow up on the following pending results:  Home Health:HHPT  Equipment/Devices: none  Discharge Condition: Stable Code Status:   Code Status: Full Code Diet recommendation:  Diet Order            Diet - low sodium heart healthy           Diet regular Room service appropriate? Yes; Fluid consistency: Thin  Diet effective now                  Brief/Interim Summary:  41 male with history of bladder cancer being followed at [facility] and found to have a left hemipelvis retroperitoneal mass and was being followed by Dr. Marin Olp and was being planned for biopsy but admitted with sudden left leg swelling pain 03/02/20. CT angio of abdomen pelvis showed left-sided paraspinal mass and decreased output in the venous system on the left, Doppler of the left leg showed extensive occlusive thrombus in the left extremity up to the external iliac vein, vascular surgery was consulted, patient was placed on heparin infusion and was admitted. Seen by VVS, IR, Oncology. 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  He is being set up for outpatient treatment with PET scan next week following which he will be getting chemo radiation and immunotherapy   Acute deep vein thrombosis (DVT) of left lower extremity: 2/2 #1 .  Switched to Lovenox and is being discharged on Lovenox daughter is a Marine scientist will help with the injection.  Follow-up  with vascular surgery for further recommendation for possible stenting  Leukocytosis: Tobacco abuse Hypertension History of bladder cancer in 2015 appears he had superficial bladder cancer that was treated with intravesicular therapy Abnormal LFTs: Has resolved since he is off his statin likely the cause.  Follow-up with PCP  Consults:  Oncology  vvs  Subjective: Alert awake pain controlled.  No new complaints.  Reports he is going home today he just spoke with his oncologist.  Discharge Exam: Vitals:   03/05/20 1923 03/06/20 0458  BP: 139/74 130/83  Pulse: 77 84  Resp: 18 18  Temp: 99 F (37.2 C) 98.8 F (37.1 C)  SpO2: 96% 99%   General: Pt is alert, awake, not in acute distress Cardiovascular: RRR, S1/S2 +, no rubs, no gallops Respiratory: CTA bilaterally, no wheezing, no rhonchi Abdominal: Soft, NT, ND, bowel sounds + Extremities: no edema, no cyanosis  Discharge Instructions  Discharge Instructions    Diet - low sodium heart healthy   Complete by: As directed    Discharge instructions   Complete by: As directed    Please call call MD or return to ER for similar or worsening recurring problem that brought you to hospital or if any fever,nausea/vomiting,abdominal pain, uncontrolled pain, chest pain,  shortness of breath or any other alarming symptoms.  Please follow-up your doctor as instructed in a week time and call the office for appointment.  Please avoid alcohol, smoking, or any other illicit substance and maintain healthy habits including  taking your regular medications as prescribed.  You were cared for by a hospitalist during your hospital stay. If you have any questions about your discharge medications or the care you received while you were in the hospital after you are discharged, you can call the unit and ask to speak with the hospitalist on call if the hospitalist that took care of you is not available.  Once you are discharged, your primary care  physician will handle any further medical issues. Please note that NO REFILLS for any discharge medications will be authorized once you are discharged, as it is imperative that you return to your primary care physician (or establish a relationship with a primary care physician if you do not have one) for your aftercare needs so that they can reassess your need for medications and monitor your lab values   Increase activity slowly   Complete by: As directed    No wound care   Complete by: As directed      Allergies as of 03/06/2020   No Known Allergies     Medication List    TAKE these medications   Aspirin Adult Low Strength 81 MG EC tablet Generic drug: aspirin TAKE 1 TABLET BY MOUTH EVERY DAY What changed: how much to take   enoxaparin 80 MG/0.8ML injection Commonly known as: LOVENOX Inject 0.8 mLs (80 mg total) into the skin every 12 (twelve) hours.   fentaNYL 50 MCG/HR Commonly known as: Boykins 1 patch onto the skin every other day.   gabapentin 600 MG tablet Commonly known as: NEURONTIN Take 1 tablet (600 mg total) by mouth in the morning, at noon, in the evening, and at bedtime.   meloxicam 15 MG tablet Commonly known as: MOBIC Take 15 mg by mouth daily.   Oxycodone HCl 10 MG Tabs Take 1 tablet (10 mg total) by mouth every 4 (four) hours as needed for severe pain.   temazepam 30 MG capsule Commonly known as: RESTORIL Take 1 capsule (30 mg total) by mouth at bedtime as needed for sleep.   Zofran 4 MG tablet Generic drug: ondansetron Take 4 mg by mouth every 8 (eight) hours as needed for nausea/vomiting.       Follow-up Information    Sammuel Hines M, PA-C Follow up in 1 week(s).   Specialty: Physician Assistant Contact information: [address] [place] [address] [phone]        Volanda Napoleon, MD. Call in 1 week(s).   Specialty: Oncology Contact information: [place] [address] [phone]               No Known Allergies  The results of significant diagnostics from this hospitalization (including imaging, microbiology, ancillary and laboratory) are listed below for reference.    Microbiology: Recent Results (from the past 240 hour(s))  SARS Coronavirus 2 by RT PCR (hospital order, performed in [facility] hospital lab) Nasopharyngeal Nasopharyngeal Swab     Status: None   Collection Time: 03/02/20  1:05 AM   Specimen: Nasopharyngeal Swab  Result Value Ref Range Status   SARS Coronavirus 2 NEGATIVE NEGATIVE Final    Comment: (NOTE) SARS-CoV-2 target nucleic acids are NOT DETECTED.  The SARS-CoV-2 RNA is generally detectable in upper and lower respiratory specimens during the acute phase of infection. The lowest concentration of SARS-CoV-2 viral copies this assay can detect is 250 copies / mL. A negative result does not preclude SARS-CoV-2 infection and should not be used  as the sole basis for treatment or other patient management decisions.  A negative result may occur with improper specimen collection / handling, submission of specimen other than nasopharyngeal swab, presence of viral mutation(s) within the areas targeted by this assay, and inadequate number of viral copies (<250 copies / mL). A negative result must be combined with clinical observations, patient history, and epidemiological information.  Fact Sheet for Patients:   StrictlyIdeas.no  Fact Sheet for Healthcare Providers: BankingDealers.co.za  This test is not yet approved or  cleared by the [place] FDA and has been authorized for detection and/or diagnosis of SARS-CoV-2 by FDA under an Emergency Use Authorization (EUA).  This EUA will remain in effect (meaning this test can be used) for the duration of the COVID-19 declaration under Section 564(b)(1) of the Act, 21 U.S.C. section 360bbb-3(b)(1), unless the authorization is terminated or revoked  sooner.  Performed at [facility], [address] [place], [place] [address]     Procedures/Studies: CT HEAD WO CONTRAST  Result Date: 03/03/2020 CLINICAL DATA:  Recent fall with headaches, initial encounter EXAM: CT HEAD WITHOUT CONTRAST TECHNIQUE: Contiguous axial images were obtained from the base of the skull through the vertex without intravenous contrast. COMPARISON:  None. FINDINGS: Brain: No evidence of acute infarction, hemorrhage, hydrocephalus, extra-axial collection or mass lesion/mass effect. Vascular: No hyperdense vessel or unexpected calcification. Skull: Normal. Negative for fracture or focal lesion. Sinuses/Orbits: No acute finding. Other: None. IMPRESSION: No acute intracranial abnormality noted. Electronically Signed   By: Inez Catalina M.D.   On: 03/03/2020 20:30   MR LUMBAR SPINE WO CONTRAST  Result Date: 02/20/2020 CLINICAL DATA:  Lumbar radiculopathy. Left hip pain. History of bladder cancer. EXAM: MRI LUMBAR SPINE WITHOUT CONTRAST TECHNIQUE: Multiplanar, multisequence MR imaging of the lumbar spine was performed. No intravenous contrast was administered. COMPARISON:  CT abdomen and pelvis 05/02/2014 FINDINGS: Segmentation:  Standard. Alignment: Minimal left convex curvature of the lumbar spine. No listhesis. Vertebrae: There is an approximately 5.6 x 4.8 x 7.1 cm (AP x transverse x craniocaudal) T1 hypointense, heterogeneously T2 hyperintense left-sided paraspinal/retroperitoneal mass extending from L4-S1 which is new from 2015. The mass invades the anterior aspect of the left sacral ala and also appears to involve the left lateral aspect of the L5 vertebral body and left L5 transverse process. The mass involves the lower lumbar plexus with lateral displacement and invasion of the left psoas muscle. The left iliacus muscle appears edematous and may also be involved by tumor medially. Tumor extends anteriorly to abut the left-sided iliac vessels. No lumbar fracture is  evident. Conus medullaris and cauda equina: Conus extends to the L2 level. Conus and cauda equina appear normal. Paraspinal and other soft tissues: Mild diffuse bladder wall thickening. Small T2 hyperintense lesions in both kidneys measuring up to 2.2 cm, likely cysts. Disc levels: L1-2: Negative. L2-3: Disc desiccation and mild disc space narrowing. Mild disc bulging eccentric to the left and mild right greater than left facet hypertrophy result in borderline left lateral recess stenosis without spinal or neural foraminal stenosis. L3-4: A small left subarticular and left foraminal disc protrusion, minimal disc bulging, and mild facet hypertrophy result in borderline left lateral recess and borderline bilateral neural foraminal stenosis without spinal stenosis. L4-5: Disc desiccation and mild disc space narrowing. Mild disc bulging, a small left foraminal disc protrusion with annular fissure, and mild facet hypertrophy result in mild bilateral lateral recess stenosis and mild bilateral neural foraminal stenosis without significant spinal stenosis.  L5-S1: No disc herniation or stenosis. Tumor extends into the lateral aspect of the left neural foramen. IMPRESSION: 1. Large invasive left paraspinal/retroperitoneal mass from L4-S1, concerning for metastatic disease although a primary malignancy such as a sarcoma is also possible. 2. Mild lumbar disc and facet degeneration with mild lateral recess and neural foraminal stenosis at L4-5. These results will be called to the ordering clinician or representative by the Radiologist Assistant, and communication documented in the PACS or [employer]. Electronically Signed   By: Logan Bores M.D.   On: 02/20/2020 12:00   CT ANGIO AO+BIFEM W & OR WO CONTRAST  Result Date: 03/02/2020 CLINICAL DATA:  Left leg swelling and discoloration, known history of EXAM: CT ANGIOGRAPHY OF ABDOMINAL AORTA WITH ILIOFEMORAL RUNOFF TECHNIQUE: Multidetector CT imaging of the abdomen,  pelvis and lower extremities was performed using the standard protocol during bolus administration of intravenous contrast. Multiplanar CT image reconstructions and MIPs were obtained to evaluate the vascular anatomy. CONTRAST:  129mL OMNIPAQUE IOHEXOL 350 MG/ML SOLN COMPARISON:  MRI of the lumbar spine from 02/20/2020, CT from 02/26/2020 FINDINGS: VASCULAR Aorta: Atherosclerotic calcifications are noted without aneurysmal dilatation or dissection. Celiac: Patent without evidence of aneurysm, dissection, vasculitis or significant stenosis. SMA: Patent without evidence of aneurysm, dissection, vasculitis or significant stenosis. Renals: Both renal arteries are patent without evidence of aneurysm, dissection, vasculitis, fibromuscular dysplasia or significant stenosis. IMA: Patent without evidence of aneurysm, dissection, vasculitis or significant stenosis. RIGHT Lower Extremity Iliacs: Atherosclerotic calcifications are noted without aneurysmal dilatation. Runoff: Common femoral and superficial femoral artery are within normal limits. Ostial artery and popliteal trifurcation are widely patent. Three-vessel runoff to the right ankle is noted. LEFT Lower Extremity Iliacs: Iliac artery is widely patent with mild atherosclerotic calcifications. No focal stenosis is seen. Runoff: Common femoral and superficial femoral arteries are widely patent. Popliteal artery demonstrates mild atherosclerotic calcifications. Popliteal trifurcation is patent with three-vessel runoff to the left foot. Veins: There is soft tissue mass along the course of the left common iliac vein consistent with the known history. The venous system below this mass lesion is dilated when compared with the right-sided venous system. This contributes to the lower extremity swelling. Possibility of underlying thrombus deserves consideration and ultrasound evaluation is recommended. Review of the MIP images confirms the above findings. NON-VASCULAR Lower  chest: Bibasilar atelectatic changes are noted. Emphysematous changes are seen as well. Hepatobiliary: No focal liver abnormality is seen. No gallstones, gallbladder wall thickening, or biliary dilatation. Pancreas: Unremarkable. No pancreatic ductal dilatation or surrounding inflammatory changes. Spleen: Normal in size without focal abnormality. Adrenals/Urinary Tract: Adrenal glands are within normal limits bilaterally. Kidneys demonstrate renal cysts bilaterally similar to that seen on prior CT examination. No obstructive changes are noted. Bladder is well distended. Stomach/Bowel: No obstructive or inflammatory changes of the colon are seen. The appendix is not well visualized although no inflammatory changes to suggest appendicitis are seen. Small bowel and stomach are within normal limits. Lymphatic: No sizable lymphadenopathy is identified. Reproductive: Prostate is unremarkable. Other: Soft tissue mass is again noted along the left psoas muscle and iliacus muscle extending in the iliac fossa similar to that seen on prior MRI examination. This causes some mass effect upon the adjacent iliac vein with dilatation of the venous system inferiorly. Mild soft tissue edema is noted likely related to the decreased venous outflow. Possibility of underlying deep venous thrombosis is raised. Ultrasound may be helpful for further evaluation. Musculoskeletal: Known mass in the L4-5 L5-S1 region on the  left. Some mild bony destruction in the sacrum is seen. The overall appearance is similar to that noted on prior MRI. IMPRESSION: VASCULAR No acute arterial abnormality is noted. Considerable venous dilatation is noted below the level of the known mass lesion in the left hemipelvis likely related to localized compression. The possibility of underlying deep venous thrombosis deserves consideration and ultrasound may be helpful for further evaluation. NON-VASCULAR Left-sided paraspinal mass lesion causing mass effect and  decreased outflow in the venous system on the left as described above. No other focal abnormality is noted. Electronically Signed   By: Inez Catalina M.D.   On: 03/02/2020 03:26   CT CHEST ABDOMEN PELVIS W CONTRAST  Result Date: 02/26/2020 CLINICAL DATA:  History of bladder cancer. Infiltrative paraspinal left lower lumbar mass detected on recent MRI lumbar spine. EXAM: CT CHEST, ABDOMEN, AND PELVIS WITH CONTRAST TECHNIQUE: Multidetector CT imaging of the chest, abdomen and pelvis was performed following the standard protocol during bolus administration of intravenous contrast. CONTRAST:  178mL OMNIPAQUE IOHEXOL 300 MG/ML  SOLN COMPARISON:  02/20/2020 lumbar spine MRI. 05/02/2014 CT abdomen/pelvis. FINDINGS: CT CHEST FINDINGS Cardiovascular: Normal heart size. No significant pericardial effusion/thickening. Atherosclerotic nonaneurysmal thoracic aorta. Normal caliber pulmonary arteries. No central pulmonary emboli. Mediastinum/Nodes: No discrete thyroid nodules. Unremarkable esophagus. No pathologically enlarged axillary, mediastinal or hilar lymph nodes. Lungs/Pleura: No pneumothorax. No pleural effusion. Moderate paraseptal and mild centrilobular emphysema with diffuse bronchial wall thickening. No acute consolidative airspace disease, lung masses or significant pulmonary nodules. Musculoskeletal: No aggressive appearing focal osseous lesions. Mild thoracic spondylosis. CT ABDOMEN PELVIS FINDINGS Hepatobiliary: Normal liver with no liver mass. Normal gallbladder with no radiopaque cholelithiasis. No biliary ductal dilatation. Pancreas: Normal, with no mass or duct dilation. Spleen: Normal size. No mass. Adrenals/Urinary Tract: Normal adrenals. Small simple bilateral renal cortical cysts, largest 1.9 cm in the interpolar right kidney. Several subcentimeter hypodense renal cortical lesions in both kidneys are too small to characterize and require no follow-up. No hydronephrosis. Mild diffuse bladder wall  thickening, chronic. Stomach/Bowel: Normal non-distended stomach. Normal caliber small bowel with no small bowel wall thickening. Normal appendix. Anterior right upper quadrant cecum. Oral contrast transits to right colon. Normal large bowel with no diverticulosis, large bowel wall thickening or pericolonic fat stranding. Vascular/Lymphatic: Atherosclerotic nonaneurysmal abdominal aorta. Patent portal, splenic, hepatic and renal veins. Enlarged 1.7 cm left external iliac lymph node (series 2/image 106), new. Poorly marginated heterogeneously enhancing 6.8 x 4.1 cm left paraspinal soft tissue mass at L5 level (series 2/image 95). No additional pathologically enlarged abdominopelvic nodes. Reproductive: Mildly enlarged prostate. Other: No pneumoperitoneum, ascites or focal fluid collection. Musculoskeletal: No aggressive appearing focal osseous lesions. Mild lumbar spondylosis. IMPRESSION: 1. Poorly marginated heterogeneously enhancing 6.8 x 4.1 cm left paraspinal soft tissue mass at L5 level, suspicious for metastatic disease. 2. Additional mild left external iliac lymphadenopathy, suspicious for nodal metastasis. 3. No additional potential sites of metastatic disease in the chest, abdomen or pelvis. 4. Chronic findings include: Mildly enlarged prostate. Mild diffuse bladder wall thickening, chronic. Aortic Atherosclerosis (ICD10-I70.0) and Emphysema (ICD10-J43.9). Electronically Signed   By: Ilona Sorrel M.D.   On: 02/26/2020 15:33   US Venous Img Lower Unilateral Left  Result Date: 03/02/2020 CLINICAL DATA:  Sudden onset left lower extremity edema. History bladder cancer and left retroperitoneal metastatic disease. EXAM: LEFT LOWER EXTREMITY VENOUS DOPPLER ULTRASOUND TECHNIQUE: Gray-scale sonography with graded compression, as well as color Doppler and duplex ultrasound were performed to evaluate the lower extremity deep venous systems from the  level of the common femoral vein and including the common femoral,  femoral, profunda femoral, popliteal and calf veins including the posterior tibial, peroneal and gastrocnemius veins when visible. The superficial great saphenous vein was also interrogated. Spectral Doppler was utilized to evaluate flow at rest and with distal augmentation maneuvers in the common femoral, femoral and popliteal veins. COMPARISON:  None. FINDINGS: Contralateral Common Femoral Vein: Respiratory phasicity is normal and symmetric with the symptomatic side. No evidence of thrombus. Normal compressibility. Common Femoral Vein: The common femoral vein is not compressible. The lumen is expanded and filled with heterogeneous low level internal echoes. No evidence of color flow on color Doppler imaging. Findings are consistent with acute occlusive DVT. Saphenofemoral Junction: Occlusive DVT extends into the saphenofemoral junction. Profunda Femoral Vein: Occlusive DVT extends into the profunda femoral vein. Femoral Vein: Occlusive DVT extends into the femoral vein and continues throughout the thigh. Popliteal Vein: Occlusive DVT throughout the popliteal vein extending into the calf veins. Calf Veins: Occlusive DVT noted in the peroneal and posterior tibial veins. Superficial Great Saphenous Vein: No evidence of thrombus. Normal compressibility. Venous Reflux:  None. Other Findings: There appears to be occlusive DVT in the external iliac vein as well. IMPRESSION: Positive for extensive acute occlusive DVT throughout the left lower extremity and also likely within the external iliac vein in the pelvis. Electronically Signed   By: Jacqulynn Cadet M.D.   On: 03/02/2020 10:17   CT Biopsy  Result Date: 03/03/2020 INDICATION: History of bladder cancer, now with infiltrative mass involving the left hemipelvis. Please perform CT-guided biopsy for tissue diagnostic purposes. EXAM: CT-GUIDED BIOPSY OF INFILTRATIVE MASS INVOLVING THE LEFT HEMIPELVIS COMPARISON:  CT the chest, abdomen pelvis-02/26/2020 MEDICATIONS:  None. ANESTHESIA/SEDATION: Fentanyl 100 mcg IV; Versed 1.5 mg IV Sedation time: 15 minutes; The patient was continuously monitored during the procedure by the interventional radiology nurse under my direct supervision. CONTRAST:  None. COMPLICATIONS: None immediate. PROCEDURE: Informed consent was obtained from the patient following an explanation of the procedure, risks, benefits and alternatives. A time out was performed prior to the initiation of the procedure. The patient was positioned prone on the CT table and a limited CT was performed for procedural planning demonstrating unchanged size and appearance of the infiltrative mass involving the left hemipelvis with dominant component measuring approximately 4.1 x 3.4 cm (image 26, series 2). The procedure was planned. The operative site was prepped and draped in the usual sterile fashion. Appropriate trajectory was confirmed with a 22 gauge spinal needle after the adjacent tissues were anesthetized with 1% Lidocaine with epinephrine. Under intermittent CT guidance, a 17 gauge coaxial needle was advanced into the peripheral aspect of the mass. Appropriate positioning was confirmed and 6 core needle biopsy samples were obtained with an 18 gauge core needle biopsy device. The co-axial needle was removed following administration of a Gel-Foam slurry and superficial hemostasis was achieved with manual compression. A limited postprocedural CT was negative for hemorrhage or additional complication. A dressing was placed. The patient tolerated the procedure well without immediate postprocedural complication. IMPRESSION: Technically successful CT guided core needle biopsy of infiltrative mass involving the left hemipelvis. Electronically Signed   By: Sandi Mariscal M.D.   On: 03/03/2020 12:23    Labs: BNP (last 3 results) No results for input(s): BNP in the last 8760 hours. Basic Metabolic Panel: Recent Labs  Lab 03/02/20 0105 03/03/20 0733 03/04/20 0337  NA 135  134* 134*  K 5.1 4.8 4.1  CL 98 98 100  CO2 28 25 26   GLUCOSE 119* 121* 131*  BUN 21 18 13   CREATININE 0.94 0.74 0.69  CALCIUM 9.3 9.3 8.7*   Liver Function Tests: Recent Labs  Lab 03/04/20 0337  AST 19  ALT 38  ALKPHOS 158*  BILITOT 0.8  PROT 6.2*  ALBUMIN 2.5*   No results for input(s): LIPASE, AMYLASE in the last 168 hours. No results for input(s): AMMONIA in the last 168 hours. CBC: Recent Labs  Lab 03/02/20 0105 03/03/20 0733 03/04/20 0337 03/05/20 0920  WBC 14.5* 14.7* 12.6* 10.9*  NEUTROABS 10.9*  --   --   --   HGB 12.0* 12.0* 10.6* 11.1*  HCT 36.7* 36.1* 31.7* 33.1*  MCV 95.3 93.3 91.4 93.2  PLT 327 336 289 330   Cardiac Enzymes: No results for input(s): CKTOTAL, CKMB, CKMBINDEX, TROPONINI in the last 168 hours. BNP: Invalid input(s): POCBNP CBG: No results for input(s): GLUCAP in the last 168 hours. D-Dimer No results for input(s): DDIMER in the last 72 hours. Hgb A1c No results for input(s): HGBA1C in the last 72 hours. Lipid Profile No results for input(s): CHOL, HDL, LDLCALC, TRIG, CHOLHDL, LDLDIRECT in the last 72 hours. Thyroid function studies No results for input(s): TSH, T4TOTAL, T3FREE, THYROIDAB in the last 72 hours.  Invalid input(s): FREET3 Anemia work up No results for input(s): VITAMINB12, FOLATE, FERRITIN, TIBC, IRON, RETICCTPCT in the last 72 hours. Urinalysis No results found for: COLORURINE, APPEARANCEUR, [place], [place], [place], [place], [place], [place], PROTEINUR, UROBILINOGEN, NITRITE, LEUKOCYTESUR Sepsis Labs Invalid input(s): PROCALCITONIN,  WBC,  LACTICIDVEN Microbiology Recent Results (from the past 240 hour(s))  SARS Coronavirus 2 by RT PCR (hospital order, performed in [facility] hospital lab) Nasopharyngeal Nasopharyngeal Swab     Status: None   Collection Time: 03/02/20  1:05 AM   Specimen: Nasopharyngeal Swab  Result Value Ref Range Status   SARS Coronavirus 2 NEGATIVE NEGATIVE Final    Comment:  (NOTE) SARS-CoV-2 target nucleic acids are NOT DETECTED.  The SARS-CoV-2 RNA is generally detectable in upper and lower respiratory specimens during the acute phase of infection. The lowest concentration of SARS-CoV-2 viral copies this assay can detect is 250 copies / mL. A negative result does not preclude SARS-CoV-2 infection and should not be used as the sole basis for treatment or other patient management decisions.  A negative result may occur with improper specimen collection / handling, submission of specimen other than nasopharyngeal swab, presence of viral mutation(s) within the areas targeted by this assay, and inadequate number of viral copies (<250 copies / mL). A negative result must be combined with clinical observations, patient history, and epidemiological information.  Fact Sheet for Patients:   StrictlyIdeas.no  Fact Sheet for Healthcare Providers: BankingDealers.co.za  This test is not yet approved or  cleared by the [place] FDA and has been authorized for detection and/or diagnosis of SARS-CoV-2 by FDA under an Emergency Use Authorization (EUA).  This EUA will remain in effect (meaning this test can be used) for the duration of the COVID-19 declaration under Section 564(b)(1) of the Act, 21 U.S.C. section 360bbb-3(b)(1), unless the authorization is terminated or revoked sooner.  Performed at [facility], [address], [place], [place] [address]      Time coordinating discharge: 25 minutes  SIGNED: Antonieta Pert, MD  [facility] 03/06/2020, 1:05 PM  If 7PM-7AM, please contact night-coverage [email or web address]

## 2020-03-06 NOTE — Progress Notes (Signed)
Received a request from Dr Marin Olp for patient to begin his treatment this Wednesday, 9/29.   Patient scheduled for treatment and chemo education. Called and spoke to patient and his daughter about his upcoming appointments. Reviewed basic appointment info including timeline, expectations, and snacks/food available. Daughter and patient had several questions which were answered to their satisfaction. They know to reach out to the office if they have any questions or concerns.   Oncology Nurse Navigator Documentation  Oncology Nurse Navigator Flowsheets 03/06/2020  Abnormal Finding Date -  Confirmed Diagnosis Date -  Diagnosis Status -  Planned Course of Treatment Chemo/Radiation Concurrent  Phase of Treatment Chemo/Radiation Concurrent  Navigator Follow Up Date: 03/11/2020  Navigator Follow Up Reason: Chemotherapy  Navigator Location [facility]  Referral Date to RadOnc/MedOnc -  Navigator Encounter Type Telephone  Telephone Outgoing Call;Education;Appt Confirmation/Clarification  Patient Visit Type MedOnc  Treatment Phase Pre-Tx/Tx Discussion  Barriers/Navigation Needs Coordination of International aid/development worker for Upcoming Surgery/ Treatment  Interventions Coordination of Care;Education;Psycho-Social Support  Acuity Level 2-Minimal Needs (1-2 Barriers Identified)  Referrals -  Coordination of Care Appts  Education Method Verbal  Support Groups/Services Friends and Family  Time Spent with Patient 57

## 2020-03-09 ENCOUNTER — Telehealth: Payer: Self-pay | Admitting: *Deleted

## 2020-03-09 NOTE — Progress Notes (Signed)
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 male who is seen as a courtesy of Dr. Marin Olp for an opinion concerning radiation therapy as part of management for his recently diagnosed metastatic urothelial carcinoma.  . The patient underwent a lumbar spine MRI on 02/20/2020 for ongoing low back pain and left hip pain. Results showed a large invasive left paraspinal/retroperitoneal mass from L4-S1 that was concerning for metastatic disease, although a primary malignancy such as sarcoma was also possible. It also showed mild lumbar disc and facet degeneration with mild lateral recess and neural foraminal stenosis at L4-5.  Given the above results, the patient was referred to Dr. Marin Olp and was seen in consultation on 02/25/2020. It was recommended that he proceed with further work-up consisting of CT scans.  CT scan of chest, abdomen, and pelvis was performed on 02/26/2020 and showed a poorly marginated heterogeneously enhancing 6.8 x 4.1 cm left paraspinal soft tissue mass at L5 level that was suspicious for metastatic disease. There was additional mild left external iliac lymphadenopathy that was suspicious for nodal metastasis. There were no additional potential sites of metastatic disease in the chest, abdomen, or pelvis.   The patient was seen it the ED on 03/02/2020 with complaints of left leg pain and swelling. A CTA of abdominal aorta with iliofemoral runoff was performed and showed a left-sided paraspinal mass lesion causing mass  effect and decreased outflow in the venous system on the left. Although there was no acute arterial abnormality noted, there was considerable venous dilatation noted below the level of the known mass lesion in the left hemipelvis that was likely related to localized compression. The possibility of underlying DVT was considered. Thus, a left lower extremity venous doppler ultrasound was performed and was positive for extensive acute occlusive DVT throughout the left lower extremity and also likely within the external iliac venin the pelvis.  Given the above findings, the patient was consulted by Dr. Oneida Alar, vascular surgeon, who recommended continuing IV Heparin. Thrombolysis and possible fasciotomy were to be considered if his leg pain/swelling worsened or if he developed a change in neurologic status. Heparin was switched to Lovenox, which the patient tolerated well.  While hospitalized, the patient underwent a CT-guided biopsy of the left hemipelvis infiltrative mass on 03/03/2020 under the care of Dr. Pascal Lux. Pathology from the procedure revealed metastatic carcinoma. Overall, the morphology and immunophenotype was consistent with metastasis from the patient's previously diagnosed urothelial carcinoma for which he underwent a TURBT on 07/25/2014. He has since been following-up with urology.  Of note, a head CT scan was performed on 03/03/2020 and was unremarkable.   The patient was last seen by Dr. Marin Olp on 03/06/2020, during which time it was recommended that he proceed with radiation and chemotherapy in hopes of significant tumor shrinkage. Radiation therapy with radiosensitizing chemotherapy was recommended to be done first, which would then be followed by full dose chemotherapy to try to eradicate any micrometastatic disease.  PREVIOUS RADIATION THERAPY: No  PAST MEDICAL HISTORY:  Past Medical History:  Diagnosis Date  . Cancer ([place])  bladder  . History of bladder cancer    s/p  TURBT  07-25-2014  . Hyperlipidemia   . Hypertension   . Mass of left hand   . 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 BLADDER SURGERY     [place]  . COLONOSCOPY    . CYSTECTOMY    . CYSTOSCOPY WITH BIOPSY N/A 07/25/2014   Procedure: CYSTOSCOPY WITH BLADDER BIOPSY AND FULGERATION;  Surgeon: [name], MD;  Location: [facility];  Service: Urology;  Laterality: N/A;  . CYSTOSCOPY WITH BIOPSY N/A 11/28/2014   Procedure: CYSTOSCOPY WITH  BLADDER BIOPSY;  Surgeon: [name], MD;  Location: [facility];  Service: Urology;  Laterality: N/A;  . HEMORRHOID SURGERY  03/15/2012   Procedure: HEMORRHOIDECTOMY;  Surgeon: [name], MD;  Location: [facility];  Service: General;  Laterality: N/A;  . HERNIA REPAIR    . INGUINAL HERNIA REPAIR Bilateral 2006  . MASS EXCISION Left 11/12/2018   Procedure: EXCISION MASS LEFT HAND;  Surgeon: Daryll Brod, MD;  Location: [facility];  Service: Orthopedics;  Laterality: Left;  FAB  . RADIOLOGY WITH ANESTHESIA N/A 02/20/2020   Procedure: MRI LUMBAR W/O CONTRAST  WITH ANESTHESIA;  Surgeon: Radiologist, Medication, MD;  Location: [facility];  Service: Radiology;  Laterality: N/A;  . TESTICLE SURGERY  2004   Ruptured Undescended Right testicle   . TRANSURETHRAL RESECTION OF BLADDER TUMOR WITH GYRUS (TURBT-GYRUS) N/A 05/23/2014   Procedure: TRANSURETHRAL RESECTION OF BLADDER TUMOR WITH GYRUS (TURBT-GYRUS);  Surgeon: [name], MD;  Location[facility];  Service: Urology;  Laterality: N/A;  . WISDOM TOOTH EXTRACTION      FAMILY HISTORY:  Family History  Problem Relation Age of Onset  . Cancer Mother        leukemia  . Diabetes Brother     SOCIAL HISTORY:  Social History   Tobacco Use  . Smoking status: Light Tobacco Smoker    Packs/day: 0.50    Years: 30.00    Pack years: 15.00    Types: Cigarettes      Last attempt to quit: 01/01/2019    Years since quitting: 1.1  . Smokeless tobacco: Never Used  Vaping Use  . Vaping Use: Never used  Substance Use Topics  . Alcohol use: Not Currently    Comment: social  . Drug use: No    ALLERGIES: No Known Allergies  MEDICATIONS:  Current Outpatient Medications  Medication Sig Dispense Refill  . ASPIRIN ADULT LOW STRENGTH 81 MG EC tablet TAKE 1 TABLET BY MOUTH EVERY DAY (Patient taking differently: Take 81 mg by mouth daily. ) 30 tablet 11  . enoxaparin (LOVENOX) 80 MG/0.8ML injection Inject 0.8 mLs (80 mg total) into the skin every 12 (twelve) hours. 48 mL 0  . fentaNYL (DURAGESIC) 50 MCG/HR Place 1 patch onto the skin every other day.     . gabapentin (NEURONTIN) 600 MG tablet Take 1 tablet (600 mg total) by mouth in the morning, at noon, in the evening, and at bedtime. 120 tablet 3  . meloxicam (MOBIC) 15 MG tablet Take 15 mg by mouth daily.    Marland Kitchen oxyCODONE 10 MG TABS Take 1 tablet (10 mg total) by mouth every 4 (four) hours as needed for severe pain. 90 tablet 0  . temazepam (RESTORIL) 30 MG capsule Take 1 capsule (30 mg total) by mouth at  bedtime as needed for sleep. 30 capsule 0  . ZOFRAN 4 MG tablet Take 4 mg by mouth every 8 (eight) hours as needed for nausea/vomiting.     No current facility-administered medications for this encounter.    REVIEW OF SYSTEMS:  A 10+ POINT REVIEW OF SYSTEMS WAS OBTAINED including neurology, dermatology, psychiatry, cardiac, respiratory, lymph, extremities, GI, GU, musculoskeletal, constitutional, reproductive, HEENT.  Reports ongoing pain in the left pelvis and left upper leg area.  He did notice some oozing around the nailbeds last night.  No fever or chills   PHYSICAL EXAM:  height is 6' (1.829 m). His oral temperature is 98.2 F (36.8 C). His blood pressure is 134/77 and his pulse is 80. His respiration is 18 and oxygen saturation is 99%.   General: Alert and oriented, in no acute distress, remains in  wheelchair for further evaluation HEENT: Head is normocephalic. Extraocular movements are intact.  Neck: Neck is supple, no palpable cervical or supraclavicular lymphadenopathy. Heart: Regular in rate and rhythm with no murmurs, rubs, or gallops. Chest: Clear to auscultation bilaterally, with no rhonchi, wheezes, or rales. Abdomen: Soft, nontender, nondistended, with no rigidity or guarding. Extremities: The left lower extremity is markedly enlarged with pitting edema throughout. Lymphatics:  Skin: No concerning lesions. Musculoskeletal: Patient is unable to lift his left leg due to pain and swelling.  He is able to wiggle his toes along the left foot  Neurologic: Cranial nerves II through XII are grossly intact. No obvious focalities. Speech is fluent. Coordination is intact. Psychiatric: Judgment and insight are intact. Affect is appropriate.   ECOG = 3  0 - Asymptomatic (Fully active, able to carry on all predisease activities without restriction)  1 - Symptomatic but completely ambulatory (Restricted in physically strenuous activity but ambulatory and able to carry out work of a light or sedentary nature. For example, light housework, office work)  2 - Symptomatic, <50% in bed during the day (Ambulatory and capable of all self care but unable to carry out any work activities. Up and about more than 50% of waking hours)  3 - Symptomatic, >50% in bed, but not bedbound (Capable of only limited self-care, confined to bed or chair 50% or more of waking hours)  4 - Bedbound (Completely disabled. Cannot carry on any self-care. Totally confined to bed or chair)  5 - Death   Eustace Pen MM, Creech RH, Tormey DC, et al. [phone]). "Toxicity and response criteria of the [facility]". [place] Oncol. 5 (6): 649-55  LABORATORY DATA:  Lab Results  Component Value Date   WBC 10.9 (H) 03/05/2020   HGB 11.1 (L) 03/05/2020   HCT 33.1 (L) 03/05/2020   MCV 93.2 03/05/2020   PLT  330 03/05/2020   NEUTROABS 10.9 (H) 03/02/2020   Lab Results  Component Value Date   NA 134 (L) 03/04/2020   K 4.1 03/04/2020   CL 100 03/04/2020   CO2 26 03/04/2020   GLUCOSE 131 (H) 03/04/2020   CREATININE 0.69 03/04/2020   CALCIUM 8.7 (L) 03/04/2020      RADIOGRAPHY: CT HEAD WO CONTRAST  Result Date: 03/03/2020 CLINICAL DATA:  Recent fall with headaches, initial encounter EXAM: CT HEAD WITHOUT CONTRAST TECHNIQUE: Contiguous axial images were obtained from the base of the skull through the vertex without intravenous contrast. COMPARISON:  None. FINDINGS: Brain: No evidence of acute infarction, hemorrhage, hydrocephalus, extra-axial collection or mass lesion/mass effect. Vascular: No hyperdense vessel or unexpected calcification. Skull: Normal. Negative for  fracture or focal lesion. Sinuses/Orbits: No acute finding. Other: None. IMPRESSION: No acute intracranial abnormality noted. Electronically Signed   By: Inez Catalina M.D.   On: 03/03/2020 20:30   MR LUMBAR SPINE WO CONTRAST  Result Date: 02/20/2020 CLINICAL DATA:  Lumbar radiculopathy. Left hip pain. History of bladder cancer. EXAM: MRI LUMBAR SPINE WITHOUT CONTRAST TECHNIQUE: Multiplanar, multisequence MR imaging of the lumbar spine was performed. No intravenous contrast was administered. COMPARISON:  CT abdomen and pelvis 05/02/2014 FINDINGS: Segmentation:  Standard. Alignment: Minimal left convex curvature of the lumbar spine. No listhesis. Vertebrae: There is an approximately 5.6 x 4.8 x 7.1 cm (AP x transverse x craniocaudal) T1 hypointense, heterogeneously T2 hyperintense left-sided paraspinal/retroperitoneal mass extending from L4-S1 which is new from 2015. The mass invades the anterior aspect of the left sacral ala and also appears to involve the left lateral aspect of the L5 vertebral body and left L5 transverse process. The mass involves the lower lumbar plexus with lateral displacement and invasion of the left psoas muscle. The  left iliacus muscle appears edematous and may also be involved by tumor medially. Tumor extends anteriorly to abut the left-sided iliac vessels. No lumbar fracture is evident. Conus medullaris and cauda equina: Conus extends to the L2 level. Conus and cauda equina appear normal. Paraspinal and other soft tissues: Mild diffuse bladder wall thickening. Small T2 hyperintense lesions in both kidneys measuring up to 2.2 cm, likely cysts. Disc levels: L1-2: Negative. L2-3: Disc desiccation and mild disc space narrowing. Mild disc bulging eccentric to the left and mild right greater than left facet hypertrophy result in borderline left lateral recess stenosis without spinal or neural foraminal stenosis. L3-4: A small left subarticular and left foraminal disc protrusion, minimal disc bulging, and mild facet hypertrophy result in borderline left lateral recess and borderline bilateral neural foraminal stenosis without spinal stenosis. L4-5: Disc desiccation and mild disc space narrowing. Mild disc bulging, a small left foraminal disc protrusion with annular fissure, and mild facet hypertrophy result in mild bilateral lateral recess stenosis and mild bilateral neural foraminal stenosis without significant spinal stenosis. L5-S1: No disc herniation or stenosis. Tumor extends into the lateral aspect of the left neural foramen. IMPRESSION: 1. Large invasive left paraspinal/retroperitoneal mass from L4-S1, concerning for metastatic disease although a primary malignancy such as a sarcoma is also possible. 2. Mild lumbar disc and facet degeneration with mild lateral recess and neural foraminal stenosis at L4-5. These results will be called to the ordering clinician or representative by the Radiologist Assistant, and communication documented in the PACS or [employer]. Electronically Signed   By: Logan Bores M.D.   On: 02/20/2020 12:00   CT ANGIO AO+BIFEM W & OR WO CONTRAST  Result Date: 03/02/2020 CLINICAL DATA:  Left leg  swelling and discoloration, known history of EXAM: CT ANGIOGRAPHY OF ABDOMINAL AORTA WITH ILIOFEMORAL RUNOFF TECHNIQUE: Multidetector CT imaging of the abdomen, pelvis and lower extremities was performed using the standard protocol during bolus administration of intravenous contrast. Multiplanar CT image reconstructions and MIPs were obtained to evaluate the vascular anatomy. CONTRAST:  1101mL OMNIPAQUE IOHEXOL 350 MG/ML SOLN COMPARISON:  MRI of the lumbar spine from 02/20/2020, CT from 02/26/2020 FINDINGS: VASCULAR Aorta: Atherosclerotic calcifications are noted without aneurysmal dilatation or dissection. Celiac: Patent without evidence of aneurysm, dissection, vasculitis or significant stenosis. SMA: Patent without evidence of aneurysm, dissection, vasculitis or significant stenosis. Renals: Both renal arteries are patent without evidence of aneurysm, dissection, vasculitis, fibromuscular dysplasia or significant stenosis. IMA: Patent without  evidence of aneurysm, dissection, vasculitis or significant stenosis. RIGHT Lower Extremity Iliacs: Atherosclerotic calcifications are noted without aneurysmal dilatation. Runoff: Common femoral and superficial femoral artery are within normal limits. Ostial artery and popliteal trifurcation are widely patent. Three-vessel runoff to the right ankle is noted. LEFT Lower Extremity Iliacs: Iliac artery is widely patent with mild atherosclerotic calcifications. No focal stenosis is seen. Runoff: Common femoral and superficial femoral arteries are widely patent. Popliteal artery demonstrates mild atherosclerotic calcifications. Popliteal trifurcation is patent with three-vessel runoff to the left foot. Veins: There is soft tissue mass along the course of the left common iliac vein consistent with the known history. The venous system below this mass lesion is dilated when compared with the right-sided venous system. This contributes to the lower extremity swelling. Possibility of  underlying thrombus deserves consideration and ultrasound evaluation is recommended. Review of the MIP images confirms the above findings. NON-VASCULAR Lower chest: Bibasilar atelectatic changes are noted. Emphysematous changes are seen as well. Hepatobiliary: No focal liver abnormality is seen. No gallstones, gallbladder wall thickening, or biliary dilatation. Pancreas: Unremarkable. No pancreatic ductal dilatation or surrounding inflammatory changes. Spleen: Normal in size without focal abnormality. Adrenals/Urinary Tract: Adrenal glands are within normal limits bilaterally. Kidneys demonstrate renal cysts bilaterally similar to that seen on prior CT examination. No obstructive changes are noted. Bladder is well distended. Stomach/Bowel: No obstructive or inflammatory changes of the colon are seen. The appendix is not well visualized although no inflammatory changes to suggest appendicitis are seen. Small bowel and stomach are within normal limits. Lymphatic: No sizable lymphadenopathy is identified. Reproductive: Prostate is unremarkable. Other: Soft tissue mass is again noted along the left psoas muscle and iliacus muscle extending in the iliac fossa similar to that seen on prior MRI examination. This causes some mass effect upon the adjacent iliac vein with dilatation of the venous system inferiorly. Mild soft tissue edema is noted likely related to the decreased venous outflow. Possibility of underlying deep venous thrombosis is raised. Ultrasound may be helpful for further evaluation. Musculoskeletal: Known mass in the L4-5 L5-S1 region on the left. Some mild bony destruction in the sacrum is seen. The overall appearance is similar to that noted on prior MRI. IMPRESSION: VASCULAR No acute arterial abnormality is noted. Considerable venous dilatation is noted below the level of the known mass lesion in the left hemipelvis likely related to localized compression. The possibility of underlying deep venous  thrombosis deserves consideration and ultrasound may be helpful for further evaluation. NON-VASCULAR Left-sided paraspinal mass lesion causing mass effect and decreased outflow in the venous system on the left as described above. No other focal abnormality is noted. Electronically Signed   By: Inez Catalina M.D.   On: 03/02/2020 03:26   CT CHEST ABDOMEN PELVIS W CONTRAST  Result Date: 02/26/2020 CLINICAL DATA:  History of bladder cancer. Infiltrative paraspinal left lower lumbar mass detected on recent MRI lumbar spine. EXAM: CT CHEST, ABDOMEN, AND PELVIS WITH CONTRAST TECHNIQUE: Multidetector CT imaging of the chest, abdomen and pelvis was performed following the standard protocol during bolus administration of intravenous contrast. CONTRAST:  182mL OMNIPAQUE IOHEXOL 300 MG/ML  SOLN COMPARISON:  02/20/2020 lumbar spine MRI. 05/02/2014 CT abdomen/pelvis. FINDINGS: CT CHEST FINDINGS Cardiovascular: Normal heart size. No significant pericardial effusion/thickening. Atherosclerotic nonaneurysmal thoracic aorta. Normal caliber pulmonary arteries. No central pulmonary emboli. Mediastinum/Nodes: No discrete thyroid nodules. Unremarkable esophagus. No pathologically enlarged axillary, mediastinal or hilar lymph nodes. Lungs/Pleura: No pneumothorax. No pleural effusion. Moderate paraseptal and mild centrilobular emphysema  with diffuse bronchial wall thickening. No acute consolidative airspace disease, lung masses or significant pulmonary nodules. Musculoskeletal: No aggressive appearing focal osseous lesions. Mild thoracic spondylosis. CT ABDOMEN PELVIS FINDINGS Hepatobiliary: Normal liver with no liver mass. Normal gallbladder with no radiopaque cholelithiasis. No biliary ductal dilatation. Pancreas: Normal, with no mass or duct dilation. Spleen: Normal size. No mass. Adrenals/Urinary Tract: Normal adrenals. Small simple bilateral renal cortical cysts, largest 1.9 cm in the interpolar right kidney. Several subcentimeter  hypodense renal cortical lesions in both kidneys are too small to characterize and require no follow-up. No hydronephrosis. Mild diffuse bladder wall thickening, chronic. Stomach/Bowel: Normal non-distended stomach. Normal caliber small bowel with no small bowel wall thickening. Normal appendix. Anterior right upper quadrant cecum. Oral contrast transits to right colon. Normal large bowel with no diverticulosis, large bowel wall thickening or pericolonic fat stranding. Vascular/Lymphatic: Atherosclerotic nonaneurysmal abdominal aorta. Patent portal, splenic, hepatic and renal veins. Enlarged 1.7 cm left external iliac lymph node (series 2/image 106), new. Poorly marginated heterogeneously enhancing 6.8 x 4.1 cm left paraspinal soft tissue mass at L5 level (series 2/image 95). No additional pathologically enlarged abdominopelvic nodes. Reproductive: Mildly enlarged prostate. Other: No pneumoperitoneum, ascites or focal fluid collection. Musculoskeletal: No aggressive appearing focal osseous lesions. Mild lumbar spondylosis. IMPRESSION: 1. Poorly marginated heterogeneously enhancing 6.8 x 4.1 cm left paraspinal soft tissue mass at L5 level, suspicious for metastatic disease. 2. Additional mild left external iliac lymphadenopathy, suspicious for nodal metastasis. 3. No additional potential sites of metastatic disease in the chest, abdomen or pelvis. 4. Chronic findings include: Mildly enlarged prostate. Mild diffuse bladder wall thickening, chronic. Aortic Atherosclerosis (ICD10-I70.0) and Emphysema (ICD10-J43.9). Electronically Signed   By: Ilona Sorrel M.D.   On: 02/26/2020 15:33   US Venous Img Lower Unilateral Left  Result Date: 03/02/2020 CLINICAL DATA:  Sudden onset left lower extremity edema. History bladder cancer and left retroperitoneal metastatic disease. EXAM: LEFT LOWER EXTREMITY VENOUS DOPPLER ULTRASOUND TECHNIQUE: Gray-scale sonography with graded compression, as well as color Doppler and duplex  ultrasound were performed to evaluate the lower extremity deep venous systems from the level of the common femoral vein and including the common femoral, femoral, profunda femoral, popliteal and calf veins including the posterior tibial, peroneal and gastrocnemius veins when visible. The superficial great saphenous vein was also interrogated. Spectral Doppler was utilized to evaluate flow at rest and with distal augmentation maneuvers in the common femoral, femoral and popliteal veins. COMPARISON:  None. FINDINGS: Contralateral Common Femoral Vein: Respiratory phasicity is normal and symmetric with the symptomatic side. No evidence of thrombus. Normal compressibility. Common Femoral Vein: The common femoral vein is not compressible. The lumen is expanded and filled with heterogeneous low level internal echoes. No evidence of color flow on color Doppler imaging. Findings are consistent with acute occlusive DVT. Saphenofemoral Junction: Occlusive DVT extends into the saphenofemoral junction. Profunda Femoral Vein: Occlusive DVT extends into the profunda femoral vein. Femoral Vein: Occlusive DVT extends into the femoral vein and continues throughout the thigh. Popliteal Vein: Occlusive DVT throughout the popliteal vein extending into the calf veins. Calf Veins: Occlusive DVT noted in the peroneal and posterior tibial veins. Superficial Great Saphenous Vein: No evidence of thrombus. Normal compressibility. Venous Reflux:  None. Other Findings: There appears to be occlusive DVT in the external iliac vein as well. IMPRESSION: Positive for extensive acute occlusive DVT throughout the left lower extremity and also likely within the external iliac vein in the pelvis. Electronically Signed   By: Dellis Filbert.D.  On: 03/02/2020 10:17   CT Biopsy  Result Date: 03/03/2020 INDICATION: History of bladder cancer, now with infiltrative mass involving the left hemipelvis. Please perform CT-guided biopsy for tissue  diagnostic purposes. EXAM: CT-GUIDED BIOPSY OF INFILTRATIVE MASS INVOLVING THE LEFT HEMIPELVIS COMPARISON:  CT the chest, abdomen pelvis-02/26/2020 MEDICATIONS: None. ANESTHESIA/SEDATION: Fentanyl 100 mcg IV; Versed 1.5 mg IV Sedation time: 15 minutes; The patient was continuously monitored during the procedure by the interventional radiology nurse under my direct supervision. CONTRAST:  None. COMPLICATIONS: None immediate. PROCEDURE: Informed consent was obtained from the patient following an explanation of the procedure, risks, benefits and alternatives. A time out was performed prior to the initiation of the procedure. The patient was positioned prone on the CT table and a limited CT was performed for procedural planning demonstrating unchanged size and appearance of the infiltrative mass involving the left hemipelvis with dominant component measuring approximately 4.1 x 3.4 cm (image 26, series 2). The procedure was planned. The operative site was prepped and draped in the usual sterile fashion. Appropriate trajectory was confirmed with a 22 gauge spinal needle after the adjacent tissues were anesthetized with 1% Lidocaine with epinephrine. Under intermittent CT guidance, a 17 gauge coaxial needle was advanced into the peripheral aspect of the mass. Appropriate positioning was confirmed and 6 core needle biopsy samples were obtained with an 18 gauge core needle biopsy device. The co-axial needle was removed following administration of a Gel-Foam slurry and superficial hemostasis was achieved with manual compression. A limited postprocedural CT was negative for hemorrhage or additional complication. A dressing was placed. The patient tolerated the procedure well without immediate postprocedural complication. IMPRESSION: Technically successful CT guided core needle biopsy of infiltrative mass involving the left hemipelvis. Electronically Signed   By: Sandi Mariscal M.D.   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  Treatment intent would be to treat the mass improve his pain and hopefully improve his significant left lower edema.  Today, I talked to the patient  about the findings and work-up thus far.  We discussed the natural history of urothelial cancer and general treatment, highlighting the role of radiotherapy in the management.  We discussed the available radiation techniques, and focused on the details of logistics and delivery.  We reviewed the anticipated acute and late sequelae associated with radiation in this setting.  The patient was encouraged to ask questions that I answered to the best of my ability.  A patient consent form was discussed and signed.  We retained a copy for our records.  The patient would like to proceed with radiation and will be scheduled for CT simulation.  PLAN: Patient will proceed with CT simulation later today.  Treatments to begin September 30.  The patient will also begin radiosensitizing chemotherapy in the next few days.  Anticipate 5-1/2 to 6 weeks of radiation therapy unless PET scan shows significant other sites of metastatic disease.  Total time spent in this encounter was 65 minutes which included reviewing the patient's most recent lumbar spine MRI, CT scans, ED visit, hospitalization, biopsy, pathology report, follow-ups, physical examination, and documentation.   ------------------------------------------------  Blair Promise, PhD, MD  This document serves as a record of services personally performed by Gery Pray, MD. It was created on his behalf by Clerance Lav, a trained  medical scribe. The creation of this record is based on the scribe's personal observations and the provider's statements to them. This document has been checked and  approved by the attending provider.

## 2020-03-09 NOTE — Progress Notes (Signed)
Pharmacist Chemotherapy Monitoring - Initial Assessment    Anticipated start date: 03/11/20   Regimen:   Are orders appropriate based on the patients diagnosis, regimen, and cycle? Yes  Does the plan date match the patients scheduled date? Yes  Is the sequencing of drugs appropriate? Yes  Are the premedications appropriate for the patients regimen? Yes  Prior Authorization for treatment is: Approved o If applicable, is the correct biosimilar selected based on the patient's insurance? not applicable  Organ Function and Labs:  Are dose adjustments needed based on the patient's renal function, hepatic function, or hematologic function? Yes  Are appropriate labs ordered prior to the start of patient's treatment? Yes  Other organ system assessment, if indicated: cisplatin: baseline audiogram  The following baseline labs, if indicated, have been ordered: cisplatin: K, Mg  Dose Assessment:  Are the drug doses appropriate? Yes  Are the following correct: o Drug concentrations Yes o IV fluid compatible with drug Yes o Administration routes Yes o Timing of therapy Yes  If applicable, does the patient have documented access for treatment and/or plans for port-a-cath placement? no  If applicable, have lifetime cumulative doses been properly documented and assessed? not applicable Lifetime Dose Tracking  No doses have been documented on this patient for the following tracked chemicals: Doxorubicin, Epirubicin, Idarubicin, Daunorubicin, Mitoxantrone, Bleomycin, Oxaliplatin, Carboplatin, Liposomal Doxorubicin  o   Toxicity Monitoring/Prevention:  The patient has the following take home antiemetics prescribed: N/A  The patient has the following take home medications prescribed: N/A  Medication allergies and previous infusion related reactions, if applicable, have been reviewed and addressed. Yes  The patient's current medication list has been assessed for drug-drug interactions  with their chemotherapy regimen. no significant drug-drug interactions were identified on review.  Order Review:  Are the treatment plan orders signed? No  Is the patient scheduled to see a provider prior to their treatment? No  I verify that I have reviewed each item in the above checklist and answered each question accordingly.  Romualdo Bolk [facility] 03/09/2020 9:29 AM

## 2020-03-09 NOTE — Progress Notes (Signed)
Patient here for a consult with Dr. Sondra Come.  Brian Napoleon, MD  Physician  Oncology  Progress Notes     Signed  Encounter Date:  02/25/2020          Signed      Expand AllCollapse All    Show:Clear all [x] Manual[x] Template[] Copied  Added by: [x] Brian Napoleon, MD  [] Hover for details Referral MD  Reason for Referral: Left paraspinal mass with left lumbar radiculopathy   No chief complaint on file. : I have pain in my left leg.   HPI: Brian Benton is a very nice 64 year old white male.  Here is originally from [place].  He works for a [employer], [employer]/video.  He has lived in different places in the country.   He has been in great health.  He is still working.  He likes to work out quite a bit.   In June, he began to note some discomfort in the lower back and left leg.  This was in the inner aspect of the left thigh.  There is no weakness.  There is no change in bowel or bladder habits.   Of note, he did have bladder cancer back in 2015.  This sounds like superficial bladder cancer that was treated with intravesicular therapy.   He continued to have the pain.  It was now on the outside of his left thigh.  He had no weakness.   He subsequently what I think was given some Neurontin.  This seemed to help a little bit.   Finally, he had an MRI of the lumbar spine.  This was done on 02/20/2020.  Surprisingly, this showed a 5.6 x 4.8 x 7 point centimeter mass on the left side of the spine.  This extended from L4-S1.  It appeared to invade the left sacral ala.  Also involved the left lateral aspect of L5 vertebral body and left L5 transverse process.  There is seem to be some involvement of the psoas and iliac is muscles.  There is no obvious cord involvement or compression.     He does have history of tobacco use.  I think he stopped about 4 5 years ago.   He really does not drink.   There is been no weight loss.  He has had no problems with bowels or bladder.   He had a colonoscopy a year ago.   There is no cough or shortness of breath.  He has had his coronavirus vaccines.   There is no headache.   There is really no history of cancer in the family.   I would have to say that overall, his performance status is ECOG 1.          Past Medical History:  Diagnosis Date   Cancer [facility])      bladder   History of bladder cancer      s/p  TURBT 07-25-2014   Hyperlipidemia     Mass of left hand         :  Past Surgical History:  Procedure Laterality Date   BLADDER SURGERY        [place]   COLONOSCOPY       CYSTECTOMY       CYSTOSCOPY WITH BIOPSY N/A 07/25/2014    Procedure: CYSTOSCOPY WITH BLADDER BIOPSY AND FULGERATION;  Surgeon: [name], MD;  Location: [facility];  Service: Urology;  Laterality: N/A;   CYSTOSCOPY WITH BIOPSY N/A 11/28/2014    Procedure:  CYSTOSCOPY WITH  BLADDER BIOPSY;  Surgeon: [name], MD;  Location[facility];  Service: Urology;  Laterality: N/A;   HEMORRHOID SURGERY   03/15/2012    Procedure: HEMORRHOIDECTOMY;  Surgeon: [name], MD;  Location: [facility];  Service: General;  Laterality: N/A;   HERNIA REPAIR       INGUINAL HERNIA REPAIR Bilateral 2006   MASS EXCISION Left 11/12/2018    Procedure: EXCISION MASS LEFT HAND;  Surgeon: Daryll Brod, MD;  Location: [place];  Service: Orthopedics;  Laterality: Left;  FAB   RADIOLOGY WITH ANESTHESIA N/A 02/20/2020    Procedure: MRI LUMBAR W/O CONTRAST  WITH ANESTHESIA;  Surgeon: Radiologist, Medication, MD;  Location: [place];  Service: Radiology;  Laterality: N/A;   TESTICLE SURGERY   2004    Ruptured Undescended Right testicle    TRANSURETHRAL RESECTION OF BLADDER TUMOR WITH GYRUS (TURBT-GYRUS) N/A 05/23/2014    Procedure: TRANSURETHRAL RESECTION OF BLADDER TUMOR WITH GYRUS (TURBT-GYRUS);  Surgeon: [name], MD;  Location: [facility];  Service: Urology;  Laterality: N/A;   WISDOM TOOTH EXTRACTION       :   Current Outpatient Medications:    ASPIRIN ADULT LOW STRENGTH 81 MG EC tablet, TAKE 1 TABLET BY MOUTH EVERY DAY (Patient taking differently: Take 81 mg by mouth daily. ), Disp: 30 tablet, Rfl: 11   brimonidine (ALPHAGAN) 0.2 % ophthalmic solution, Place 1 drop into the left eye 2 (two) times daily. (Patient not taking: Reported on 02/12/2020), Disp: 10 mL, Rfl: 11   calcitonin, salmon, (MIACALCIN/FORTICAL) 200 UNIT/ACT nasal spray, Place 1 spray into alternate nostrils daily., Disp: , Rfl:    Calcium Carbonate-Vit D-Min (CALTRATE 600+D PLUS MINERALS PO), Take 1 tablet by mouth daily., Disp: , Rfl:    Cholecalciferol (VITAMIN D) 50 MCG (2000 UT) tablet, Take 2,000 Units by mouth daily., Disp: , Rfl:    fentaNYL (DURAGESIC) 50 MCG/HR, Place 1 patch onto the skin every 3 (three) days for 3 days., Disp: 10 patch, Rfl: 0   gabapentin (NEURONTIN) 600 MG tablet, Take 1 tablet (600 mg total) by mouth in the morning, at noon, in the evening, and at bedtime., Disp: 120 tablet, Rfl: 3   meloxicam (MOBIC) 15 MG tablet, Take 15 mg by mouth daily., Disp: , Rfl:    MULTIPLE MINERALS PO, Take 2 tablets by mouth daily. , Disp: , Rfl:    oxyCODONE 10 MG TABS, Take 1 tablet (10 mg total) by mouth every 4 (four) hours as needed for severe pain., Disp: 90 tablet, Rfl: 0   temazepam (RESTORIL) 30 MG capsule, Take 1 capsule (30 mg total) by mouth at bedtime as needed for sleep., Disp: 30 capsule, Rfl: 0:        :  No Known Allergies:  Family History  Problem Relation Age of Onset   Cancer Mother          leukemia   Diabetes Brother    :           Shockingly, the pathology report shows that this is metastatic bladder cancer.  He only had superficial bladder cancer back in 2015.  This was treated with intravesicular BCG.  He gets routine follow-up by urology.   From the studies so far, it looks like this is just a solitary area of recurrence.  We will see about a PET scan as an outpatient.  I would  think though that the PET scan would not show  [place] anything elsewhere.   Bladder cancer is highly sensitive to radiation and chemotherapy.  Since he has had neither yet, I feel very confident that we can induce significant tumor shrinkage.   I probably would do radiation therapy upfront.  This will do the "bulk" of the workforce.  I would use radiosensitizing chemotherapy to allow radiation therapy to work better.   After radiation therapy is completed, then I will use full dose chemotherapy to try to eradicate any micrometastatic disease that he may have.   Now, the FDA has approved the use of immunotherapy after chemotherapy for maintenance treatment for metastatic bladder cancer.   I have sent the tumor specimen off for molecular studies.  We will see if there is any molecular/genetic aberration that we might be able to target.   I talked to Brian Benton about all this.  I have already spoken Dr. Sondra Come of Radiation Oncology and he will see him on Tuesday I think.   I very much appreciate the efforts by Dr. Oneida Alar of Vascular Surgery.  It would be great if a iliac stent could be placed.  I really think this might be able to provide some relief for the left leg swelling.  I think this will allow Brian Benton to have a better quality of life long-term as I really think that we can get him several years given that this is a localized recurrence of bladder cancer.   I spoke to Brian Benton this morning.  He is clearly in great shape for aggressive therapy.   I'm not sure there is still any surgical option after we do therapy on him.  I would not think so given the current scan findings.   I will try to get him on to Lovenox now.  I do not anticipate any invasive studies.  He has great peripheral IV access so he does not need a Port-A-Cath.   He will need twice a day Lovenox.  Ultimately, we might be able to get him onto something oral once we decrease the thrombus burden.   His labs today are still  pending.   He really wants to go home today.  I think he should be able to go home today.  His daughter who is up from [place] is a nurse so she can certainly do the Lovenox.  We need to make sure that Lovenox is covered by the insurance.   He has been very complementary of the care he is received on 3 E.  I'm not surprised by this.  The staff are incredibly compassionate and show a lot of professionalism and caring for my patients whenever they are up on the floor.   Brian Haw, MD  Past/Anticipated interventions by medical oncology, if any: will be obtaining chemo  Weight changes, if any: no  Bowel/Bladder complaints, if any: constipation from pain meds  Nausea/Vomiting, if any: no  Pain issues, if any:  yes "3" in left leg  SAFETY ISSUES: Prior radiation?no Pacemaker/ICD? no Possible current pregnancy? N/a Is the patient on methotrexate? no  Current Complaints / other details:   BP 134/77 (BP Location: Left Arm, Patient Position: Sitting)   Pulse 80   Temp 98.2 F (36.8 C) (Oral)   Resp 18   Ht 6' (1.829 m)   SpO2 99%   BMI 23.91 kg/m    Wt Readings from Last 3 Encounters:  03/06/20 176 lb 4.8 oz (80 kg)  02/20/20 169 lb 15.6 oz (77.1 kg)  03/04/19  165 lb (74.8 kg)

## 2020-03-09 NOTE — Telephone Encounter (Signed)
Pts leg swelling is doing much better and he does not wish to proceed with thrombectomy in the PV lab.

## 2020-03-10 ENCOUNTER — Encounter: Payer: Self-pay | Admitting: Radiation Oncology

## 2020-03-10 ENCOUNTER — Other Ambulatory Visit: Payer: Self-pay

## 2020-03-10 ENCOUNTER — Ambulatory Visit
Admission: RE | Admit: 2020-03-10 | Discharge: 2020-03-10 | Disposition: A | Discharge status: Home / Self Care | Payer: 59 | Source: Ambulatory Visit | Attending: Radiation Oncology | Admitting: Radiation Oncology

## 2020-03-10 ENCOUNTER — Other Ambulatory Visit: Payer: Self-pay | Admitting: Hematology & Oncology

## 2020-03-10 ENCOUNTER — Other Ambulatory Visit: Payer: Self-pay | Admitting: *Deleted

## 2020-03-10 ENCOUNTER — Encounter: Payer: Self-pay | Admitting: *Deleted

## 2020-03-10 DIAGNOSIS — C799 Secondary malignant neoplasm of unspecified site: Secondary | ICD-10-CM | POA: Diagnosis not present

## 2020-03-10 DIAGNOSIS — I700 Atherosclerosis of aorta: Secondary | ICD-10-CM | POA: Diagnosis not present

## 2020-03-10 DIAGNOSIS — C786 Secondary malignant neoplasm of retroperitoneum and peritoneum: Secondary | ICD-10-CM | POA: Insufficient documentation

## 2020-03-10 DIAGNOSIS — E785 Hyperlipidemia, unspecified: Secondary | ICD-10-CM | POA: Insufficient documentation

## 2020-03-10 DIAGNOSIS — Z510 Encounter for antineoplastic radiation therapy: Secondary | ICD-10-CM | POA: Insufficient documentation

## 2020-03-10 DIAGNOSIS — M48061 Spinal stenosis, lumbar region without neurogenic claudication: Secondary | ICD-10-CM | POA: Diagnosis not present

## 2020-03-10 DIAGNOSIS — F17210 Nicotine dependence, cigarettes, uncomplicated: Secondary | ICD-10-CM | POA: Insufficient documentation

## 2020-03-10 DIAGNOSIS — I10 Essential (primary) hypertension: Secondary | ICD-10-CM | POA: Diagnosis not present

## 2020-03-10 DIAGNOSIS — C679 Malignant neoplasm of bladder, unspecified: Secondary | ICD-10-CM | POA: Insufficient documentation

## 2020-03-10 DIAGNOSIS — Z806 Family history of leukemia: Secondary | ICD-10-CM | POA: Diagnosis not present

## 2020-03-10 DIAGNOSIS — N400 Enlarged prostate without lower urinary tract symptoms: Secondary | ICD-10-CM | POA: Insufficient documentation

## 2020-03-10 DIAGNOSIS — R1900 Intra-abdominal and pelvic swelling, mass and lump, unspecified site: Secondary | ICD-10-CM

## 2020-03-10 DIAGNOSIS — M7989 Other specified soft tissue disorders: Secondary | ICD-10-CM | POA: Insufficient documentation

## 2020-03-10 DIAGNOSIS — Z7901 Long term (current) use of anticoagulants: Secondary | ICD-10-CM | POA: Diagnosis not present

## 2020-03-10 DIAGNOSIS — Z79899 Other long term (current) drug therapy: Secondary | ICD-10-CM | POA: Insufficient documentation

## 2020-03-10 NOTE — Progress Notes (Signed)
Per MD notes, patient will need a PET scan. Order obtained and scheduled for 03/19/2020.  Called patient to give him appointment details. Will review details again tomorrow as well as give him PET information sheet.   Oncology Nurse Navigator Documentation  Oncology Nurse Navigator Flowsheets 03/10/2020  Abnormal Finding Date -  Confirmed Diagnosis Date -  Diagnosis Status -  Planned Course of Treatment -  Phase of Treatment -  Navigator Follow Up Date: 03/11/2020  Navigator Follow Up Reason: Chemotherapy  Navigator Location [facility]  Referral Date to RadOnc/MedOnc -  Navigator Encounter Type Appt/Treatment Plan Review;Telephone  Telephone Outgoing Call;Appt Confirmation/Clarification  Patient Visit Type MedOnc  Treatment Phase Pre-Tx/Tx Discussion  Barriers/Navigation Needs Coordination of International aid/development worker for Upcoming Surgery/ Treatment  Interventions Coordination of Care;Education  Acuity Level 2-Minimal Needs (1-2 Barriers Identified)  Referrals -  Coordination of Care Appts  Education Method Verbal;Written  Support Groups/Services Friends and Family  Time Spent with Patient 62

## 2020-03-11 ENCOUNTER — Other Ambulatory Visit: Payer: Self-pay | Admitting: Hematology & Oncology

## 2020-03-11 ENCOUNTER — Inpatient Hospital Stay: Payer: 59

## 2020-03-11 ENCOUNTER — Encounter: Payer: Self-pay | Admitting: *Deleted

## 2020-03-11 ENCOUNTER — Other Ambulatory Visit: Payer: Self-pay

## 2020-03-11 ENCOUNTER — Telehealth: Payer: Self-pay

## 2020-03-11 ENCOUNTER — Other Ambulatory Visit: Payer: Self-pay | Admitting: *Deleted

## 2020-03-11 VITALS — BP 125/47 | HR 82 | Temp 98.2°F | Resp 18

## 2020-03-11 DIAGNOSIS — Z510 Encounter for antineoplastic radiation therapy: Secondary | ICD-10-CM | POA: Diagnosis not present

## 2020-03-11 DIAGNOSIS — Z87891 Personal history of nicotine dependence: Secondary | ICD-10-CM | POA: Diagnosis not present

## 2020-03-11 DIAGNOSIS — C799 Secondary malignant neoplasm of unspecified site: Secondary | ICD-10-CM

## 2020-03-11 DIAGNOSIS — R7989 Other specified abnormal findings of blood chemistry: Secondary | ICD-10-CM | POA: Diagnosis not present

## 2020-03-11 DIAGNOSIS — C679 Malignant neoplasm of bladder, unspecified: Secondary | ICD-10-CM

## 2020-03-11 DIAGNOSIS — Z806 Family history of leukemia: Secondary | ICD-10-CM | POA: Diagnosis not present

## 2020-03-11 DIAGNOSIS — Z5111 Encounter for antineoplastic chemotherapy: Secondary | ICD-10-CM | POA: Diagnosis not present

## 2020-03-11 DIAGNOSIS — M79652 Pain in left thigh: Secondary | ICD-10-CM | POA: Diagnosis not present

## 2020-03-11 DIAGNOSIS — R1900 Intra-abdominal and pelvic swelling, mass and lump, unspecified site: Secondary | ICD-10-CM | POA: Diagnosis not present

## 2020-03-11 DIAGNOSIS — Z8551 Personal history of malignant neoplasm of bladder: Secondary | ICD-10-CM | POA: Diagnosis not present

## 2020-03-11 LAB — CMP (CANCER CENTER ONLY)
ALT: 31 U/L (ref 0–44)
AST: 24 U/L (ref 15–41)
Albumin: 3.6 g/dL (ref 3.5–5.0)
Alkaline Phosphatase: 114 U/L (ref 38–126)
Anion gap: 4 — ABNORMAL LOW (ref 5–15)
BUN: 14 mg/dL (ref 8–23)
CO2: 32 mmol/L (ref 22–32)
Calcium: 10.2 mg/dL (ref 8.9–10.3)
Chloride: 98 mmol/L (ref 98–111)
Creatinine: 0.72 mg/dL (ref 0.61–1.24)
GFR, Est AFR Am: >60 mL/min (ref >60)
GFR, Estimated: >60 mL/min (ref >60)
Glucose, Bld: 94 mg/dL (ref 70–99)
Potassium: 4.8 mmol/L (ref 3.5–5.1)
Sodium: 134 mmol/L — ABNORMAL LOW (ref 135–145)
Total Bilirubin: 0.3 mg/dL (ref 0.3–1.2)
Total Protein: 6.9 g/dL (ref 6.5–8.1)

## 2020-03-11 LAB — CBC WITH DIFFERENTIAL (CANCER CENTER ONLY)
Abs Immature Granulocytes: 0.05 10*3/uL (ref 0.00–0.07)
Basophils Absolute: 0.1 10*3/uL (ref 0.0–0.1)
Basophils Relative: 1 %
Eosinophils Absolute: 2.3 10*3/uL — ABNORMAL HIGH (ref 0.0–0.5)
Eosinophils Relative: 19 %
HCT: 30.4 % — ABNORMAL LOW (ref 39.0–52.0)
Hemoglobin: 10.2 g/dL — ABNORMAL LOW (ref 13.0–17.0)
Immature Granulocytes: 0 %
Lymphocytes Relative: 17 %
Lymphs Abs: 2 10*3/uL (ref 0.7–4.0)
MCH: 31.8 pg (ref 26.0–34.0)
MCHC: 33.6 g/dL (ref 30.0–36.0)
MCV: 94.7 fL (ref 80.0–100.0)
Monocytes Absolute: 1.2 10*3/uL — ABNORMAL HIGH (ref 0.1–1.0)
Monocytes Relative: 10 %
Neutro Abs: 6.5 10*3/uL (ref 1.7–7.7)
Neutrophils Relative %: 53 %
Platelet Count: 435 10*3/uL — ABNORMAL HIGH (ref 150–400)
RBC: 3.21 MIL/uL — ABNORMAL LOW (ref 4.22–5.81)
RDW: 15.1 % (ref 11.5–15.5)
WBC Count: 12.1 10*3/uL — ABNORMAL HIGH (ref 4.0–10.5)
nRBC: 0 % (ref 0.0–0.2)

## 2020-03-11 MED ORDER — ONDANSETRON HCL 8 MG PO TABS: 8.0000 mg | ORAL_TABLET | Freq: Two times a day (BID) | ORAL | 1 refills | Status: DC | PRN

## 2020-03-11 MED ORDER — HEPARIN SOD (PORK) LOCK FLUSH 100 UNIT/ML IV SOLN
500.0000 [IU] | Freq: Once | INTRAVENOUS | Status: DC | PRN
  Filled 2020-03-11: qty 5

## 2020-03-11 MED ORDER — SODIUM CHLORIDE 0.9 % IV SOLN
35.0000 mg/m2 | Freq: Once | INTRAVENOUS | Status: AC
  Administered 2020-03-11: 71 mg via INTRAVENOUS
  Filled 2020-03-11: qty 71

## 2020-03-11 MED ORDER — PROCHLORPERAZINE MALEATE 10 MG PO TABS: 10.0000 mg | ORAL_TABLET | Freq: Four times a day (QID) | ORAL | 1 refills | Status: DC | PRN

## 2020-03-11 MED ORDER — SODIUM CHLORIDE 0.9 % IV SOLN
Freq: Once | INTRAVENOUS | Status: AC
  Filled 2020-03-11: qty 250

## 2020-03-11 MED ORDER — SODIUM CHLORIDE 0.9 % IV SOLN
10.0000 mg | Freq: Once | INTRAVENOUS | Status: AC
  Administered 2020-03-11: 10 mg via INTRAVENOUS
  Filled 2020-03-11: qty 10

## 2020-03-11 MED ORDER — SODIUM CHLORIDE 0.9 % IV SOLN
150.0000 mg | Freq: Once | INTRAVENOUS | Status: AC
  Administered 2020-03-11: 150 mg via INTRAVENOUS
  Filled 2020-03-11: qty 150

## 2020-03-11 MED ORDER — SODIUM CHLORIDE 0.9% FLUSH
10.0000 mL | INTRAVENOUS | Status: DC | PRN
  Filled 2020-03-11: qty 10

## 2020-03-11 MED ORDER — SODIUM CHLORIDE 0.9 % IV SOLN
Freq: Once | INTRAVENOUS | Status: AC
  Filled 2020-03-11: qty 10

## 2020-03-11 MED ORDER — LORAZEPAM 0.5 MG PO TABS: 0.5000 mg | ORAL_TABLET | Freq: Four times a day (QID) | ORAL | 1 refills | Status: DC | PRN

## 2020-03-11 MED ORDER — ZOLPIDEM TARTRATE 10 MG PO TABS: 10.0000 mg | ORAL_TABLET | Freq: Every evening | ORAL | 0 refills | Status: DC | PRN

## 2020-03-11 MED ORDER — PALONOSETRON HCL INJECTION 0.25 MG/5ML
INTRAVENOUS | Status: AC
  Filled 2020-03-11: qty 5

## 2020-03-11 MED ORDER — DEXAMETHASONE 4 MG PO TABS: ORAL_TABLET | ORAL | 1 refills | Status: DC

## 2020-03-11 MED ORDER — PALONOSETRON HCL INJECTION 0.25 MG/5ML
0.2500 mg | Freq: Once | INTRAVENOUS | Status: AC
  Administered 2020-03-11: 0.25 mg via INTRAVENOUS

## 2020-03-11 MED FILL — LORazepam 0.5 MG TABS: 0.5 | 8 days supply | Qty: 30 | Fill #0

## 2020-03-11 MED FILL — ONDANSETRON HCL 8 MG TABLET: 8 | 15 days supply | Qty: 30 | Fill #0

## 2020-03-11 MED FILL — DEXAMETHASONE 4 MG TABLET: 4 | 15 days supply | Qty: 30 | Fill #0

## 2020-03-11 MED FILL — PROCHLORPERAZINE 10 MG TAB: 10 | 8 days supply | Qty: 30 | Fill #0

## 2020-03-11 MED FILL — ZOLPIDEM TARTRATE 10 MG TAB: 10 | 30 days supply | Qty: 30 | Fill #0

## 2020-03-11 NOTE — Telephone Encounter (Signed)
Called patients daughter, Judeen Hammans to see what kind of walker she was requesting for patient. She states he has a rolling walker but needs a standard walker to help with balance. Informed her Dr.Ennever can write rx for him to take to local medical supply store.  Also informed her his prescriptions were all sent to the pharmacy. And Inquired about her FMLA form, we didn't see a spot for him to sign. She states she was unsure why the form was so short but if he could write a letter just in case they need it. Letter printed and signed and given to patient to take to daughter.   She verbalized all understanding and denies any further questions or concerns.

## 2020-03-11 NOTE — Progress Notes (Signed)
Visited with patient in treatment room after his chemo education, but before treatment started. He is doing well and is ready to start treatment. His home prn medications will be called in today. I will reach out to him tomorrow and see how he feels. His radiation therapy will start tomorrow.   Gave patient radiology info sheet for his PET scan on 10/7 and reviewed all the instructions.   Oncology Nurse Navigator Documentation  Oncology Nurse Navigator Flowsheets 03/11/2020  Abnormal Finding Date -  Confirmed Diagnosis Date -  Diagnosis Status -  Planned Course of Treatment Chemo/Radiation Concurrent  Phase of Treatment Chemo/Radiation Concurrent  Chemo/Radiation Concurrent Actual Start Date: 03/11/2020  Navigator Follow Up Date: 03/18/2020  Navigator Follow Up Reason: Follow-up Appointment;Chemotherapy  Navigator Location [facility]  Referral Date to RadOnc/MedOnc -  Navigator Encounter Type Treatment  Telephone -  Treatment Initiated Date 03/11/2020  Patient Visit Type MedOnc  Treatment Phase First Chemo Tx  Barriers/Navigation Needs Coordination of Care;Education  Education Other  Interventions Coordination of Care;Education;Psycho-Social Support  Acuity Level 2-Minimal Needs (1-2 Barriers Identified)  Referrals -  Coordination of Care Appts  Education Method Verbal;Written  Support Groups/Services Friends and Family  Time Spent with Patient 63

## 2020-03-11 NOTE — Progress Notes (Signed)
[name] is here today for his first chemotherapy treatment.  I sat with him for approximately 45 minutes talking to he and his daughter about Cisplatin, the drug he will be receiving.  He had several questions and I was able to answer them. Extra reading materials were provided for him related to eating hints and chemotherapy in general.  His daughter is a Marine scientist in [place].  [name] was able to sign his consent form and it was scanned onto the chart.

## 2020-03-11 NOTE — Patient Instructions (Signed)

## 2020-03-12 ENCOUNTER — Encounter: Payer: Self-pay | Admitting: *Deleted

## 2020-03-12 ENCOUNTER — Telehealth: Payer: Self-pay

## 2020-03-12 ENCOUNTER — Other Ambulatory Visit: Payer: 59

## 2020-03-12 ENCOUNTER — Other Ambulatory Visit: Payer: Self-pay

## 2020-03-12 ENCOUNTER — Ambulatory Visit
Admission: RE | Admit: 2020-03-12 | Discharge: 2020-03-12 | Disposition: A | Discharge status: Home / Self Care | Payer: 59 | Source: Ambulatory Visit | Attending: Radiation Oncology | Admitting: Radiation Oncology

## 2020-03-12 ENCOUNTER — Other Ambulatory Visit (HOSPITAL_COMMUNITY)
Admission: RE | Admit: 2020-03-12 | Discharge: 2020-03-12 | Disposition: A | Discharge status: Home / Self Care | Payer: 59 | Source: Ambulatory Visit | Attending: Vascular Surgery | Admitting: Vascular Surgery

## 2020-03-12 ENCOUNTER — Ambulatory Visit: Payer: 59 | Admitting: Radiation Oncology

## 2020-03-12 DIAGNOSIS — C799 Secondary malignant neoplasm of unspecified site: Secondary | ICD-10-CM

## 2020-03-12 DIAGNOSIS — Z20822 Contact with and (suspected) exposure to covid-19: Secondary | ICD-10-CM | POA: Diagnosis not present

## 2020-03-12 DIAGNOSIS — Z510 Encounter for antineoplastic radiation therapy: Secondary | ICD-10-CM | POA: Diagnosis not present

## 2020-03-12 DIAGNOSIS — Z01812 Encounter for preprocedural laboratory examination: Secondary | ICD-10-CM | POA: Diagnosis present

## 2020-03-12 LAB — SARS CORONAVIRUS 2 (TAT 6-24 HRS): SARS Coronavirus 2: NEGATIVE

## 2020-03-12 NOTE — Progress Notes (Signed)
Spoke with patient's wife this morning regarding clarification of appointment times. Asked her how patient was doing after chemo and she stated "well". Asked her to tell the patient that I would call him later today and check on him after chemo.   Attempted to call patient x 2 without answer. No voicemail. Received a message from patient at 1:15p  He states he is doing well. No complaints. He is having trouble with sleeping, which has been present for a few weeks but otherwise feels good. He states today is one of his better days in recent past. He has all his prn medications at home. He knows to call if he has questions or concerns. He states he doesn't need a call back at this time.   Oncology Nurse Navigator Documentation  Oncology Nurse Navigator Flowsheets 03/12/2020  Abnormal Finding Date -  Confirmed Diagnosis Date -  Diagnosis Status -  Planned Course of Treatment -  Phase of Treatment -  Chemo/Radiation Concurrent Actual Start Date: -  Navigator Follow Up Date: 03/18/2020  Navigator Follow Up Reason: Follow-up Appointment;Chemotherapy  Navigator Location [facility]  Referral Date to RadOnc/MedOnc -  Navigator Encounter Type Telephone  Telephone Patient Update;Outgoing Call  Treatment Initiated Date -  Patient Visit Type MedOnc  Treatment Phase Active Tx  Barriers/Navigation Needs Coordination of Care;Education  Education -  Interventions Other  Acuity Level 2-Minimal Needs (1-2 Barriers Identified)  Referrals -  Coordination of Care -  Education Method -  Support Groups/Services Friends and Family  Time Spent with Patient 30

## 2020-03-12 NOTE — Progress Notes (Signed)
This nurse tried calling patient to do a first time chemotherapy follow up call. His phone is not set up to receive voice mails. Will continue to call and reach him. He got first time Cisplatin.

## 2020-03-12 NOTE — Telephone Encounter (Addendum)
I called patient's wife to schedule his COVID test for 1pm today.  His wife said he has Cancer treatments at 8:45am tomorrow and needs to be finished with his surgery with Dr. Oneida Alar by then.  I advised her he probably won't be finished by then especially since he needs to be observed prior to discharge.  She says she will try to reschedule his cancer treatments to the afternoon.

## 2020-03-13 ENCOUNTER — Ambulatory Visit (HOSPITAL_COMMUNITY)
Admission: RE | Admit: 2020-03-13 | Discharge: 2020-03-13 | Disposition: A | Discharge status: Home / Self Care | Payer: 59 | Attending: Vascular Surgery | Admitting: Vascular Surgery

## 2020-03-13 ENCOUNTER — Encounter (HOSPITAL_COMMUNITY): Payer: Self-pay | Admitting: Vascular Surgery

## 2020-03-13 ENCOUNTER — Ambulatory Visit: Payer: 59

## 2020-03-13 ENCOUNTER — Encounter (HOSPITAL_COMMUNITY)
Admission: RE | Disposition: A | Discharge status: Home / Self Care | Payer: Self-pay | Source: Home / Self Care | Attending: Vascular Surgery

## 2020-03-13 ENCOUNTER — Ambulatory Visit
Admission: RE | Admit: 2020-03-13 | Discharge: 2020-03-13 | Disposition: A | Discharge status: Home / Self Care | Payer: 59 | Source: Ambulatory Visit | Attending: Radiation Oncology | Admitting: Radiation Oncology

## 2020-03-13 ENCOUNTER — Other Ambulatory Visit: Payer: Self-pay

## 2020-03-13 DIAGNOSIS — Z87891 Personal history of nicotine dependence: Secondary | ICD-10-CM | POA: Diagnosis not present

## 2020-03-13 DIAGNOSIS — C7989 Secondary malignant neoplasm of other specified sites: Secondary | ICD-10-CM | POA: Insufficient documentation

## 2020-03-13 DIAGNOSIS — I82432 Acute embolism and thrombosis of left popliteal vein: Secondary | ICD-10-CM

## 2020-03-13 DIAGNOSIS — I82422 Acute embolism and thrombosis of left iliac vein: Secondary | ICD-10-CM | POA: Insufficient documentation

## 2020-03-13 DIAGNOSIS — I82412 Acute embolism and thrombosis of left femoral vein: Secondary | ICD-10-CM

## 2020-03-13 DIAGNOSIS — Z510 Encounter for antineoplastic radiation therapy: Secondary | ICD-10-CM | POA: Diagnosis not present

## 2020-03-13 DIAGNOSIS — Z8551 Personal history of malignant neoplasm of bladder: Secondary | ICD-10-CM | POA: Insufficient documentation

## 2020-03-13 DIAGNOSIS — C679 Malignant neoplasm of bladder, unspecified: Secondary | ICD-10-CM | POA: Insufficient documentation

## 2020-03-13 DIAGNOSIS — Z806 Family history of leukemia: Secondary | ICD-10-CM | POA: Diagnosis not present

## 2020-03-13 DIAGNOSIS — E785 Hyperlipidemia, unspecified: Secondary | ICD-10-CM | POA: Diagnosis not present

## 2020-03-13 HISTORY — PX: LOWER EXTREMITY VENOGRAPHY: CATH118253

## 2020-03-13 HISTORY — PX: PERIPHERAL VASCULAR THROMBECTOMY: CATH118306

## 2020-03-13 HISTORY — PX: INTRAVASCULAR ULTRASOUND/IVUS: CATH118244

## 2020-03-13 HISTORY — PX: PERIPHERAL VASCULAR INTERVENTION: CATH118257

## 2020-03-13 HISTORY — PX: IVC VENOGRAPHY: CATH118301

## 2020-03-13 LAB — POCT ACTIVATED CLOTTING TIME
Activated Clotting Time: 169 seconds
Activated Clotting Time: 224 seconds
Activated Clotting Time: 224 seconds
Activated Clotting Time: 263 seconds

## 2020-03-13 SURGERY — PERIPHERAL VASCULAR THROMBECTOMY
Anesthesia: LOCAL

## 2020-03-13 MED ORDER — MIDAZOLAM HCL 2 MG/2ML IJ SOLN
INTRAMUSCULAR | Status: AC
  Filled 2020-03-13: qty 2

## 2020-03-13 MED ORDER — ACETAMINOPHEN 325 MG PO TABS: 650.0000 mg | ORAL_TABLET | ORAL | Status: DC | PRN

## 2020-03-13 MED ORDER — SODIUM CHLORIDE 0.9% FLUSH: 3.0000 mL | INTRAVENOUS | Status: DC | PRN

## 2020-03-13 MED ORDER — HYDROMORPHONE HCL 1 MG/ML IJ SOLN
INTRAMUSCULAR | Status: AC
  Filled 2020-03-13: qty 0.5

## 2020-03-13 MED ORDER — HEPARIN SODIUM (PORCINE) 1000 UNIT/ML IJ SOLN
INTRAMUSCULAR | Status: DC | PRN
  Administered 2020-03-13 (×2): 10000 [IU] via INTRAVENOUS
  Administered 2020-03-13: 5000 [IU] via INTRAVENOUS

## 2020-03-13 MED ORDER — MORPHINE SULFATE (PF) 2 MG/ML IV SOLN: 2.0000 mg | INTRAVENOUS | Status: DC | PRN

## 2020-03-13 MED ORDER — ONDANSETRON HCL 4 MG/2ML IJ SOLN: 4.0000 mg | Freq: Four times a day (QID) | INTRAMUSCULAR | Status: DC | PRN

## 2020-03-13 MED ORDER — LABETALOL HCL 5 MG/ML IV SOLN: 10.0000 mg | INTRAVENOUS | Status: DC | PRN

## 2020-03-13 MED ORDER — FENTANYL CITRATE (PF) 100 MCG/2ML IJ SOLN
INTRAMUSCULAR | Status: AC
  Filled 2020-03-13: qty 2

## 2020-03-13 MED ORDER — HEPARIN SODIUM (PORCINE) 1000 UNIT/ML IJ SOLN
INTRAMUSCULAR | Status: AC
  Filled 2020-03-13: qty 1

## 2020-03-13 MED ORDER — SODIUM CHLORIDE 0.9 % IV SOLN: 250.0000 mL | INTRAVENOUS | Status: DC | PRN

## 2020-03-13 MED ORDER — SODIUM CHLORIDE 0.9 % IV SOLN: INTRAVENOUS | Status: DC

## 2020-03-13 MED ORDER — HYDRALAZINE HCL 20 MG/ML IJ SOLN: 5.0000 mg | INTRAMUSCULAR | Status: DC | PRN

## 2020-03-13 MED ORDER — FENTANYL CITRATE (PF) 100 MCG/2ML IJ SOLN
INTRAMUSCULAR | Status: DC | PRN
Start: 2020-03-13 — End: 2020-03-13
  Administered 2020-03-13 (×2): 50 ug via INTRAVENOUS

## 2020-03-13 MED ORDER — HEPARIN (PORCINE) IN NACL 1000-0.9 UT/500ML-% IV SOLN
INTRAVENOUS | Status: AC
  Filled 2020-03-13: qty 1000

## 2020-03-13 MED ORDER — ENOXAPARIN SODIUM 80 MG/0.8ML ~~LOC~~ SOLN
80.0000 mg | Freq: Two times a day (BID) | SUBCUTANEOUS | Status: AC
  Filled 2020-03-13: qty 0.8

## 2020-03-13 MED ORDER — OXYCODONE HCL 5 MG PO TABS: 5.0000 mg | ORAL_TABLET | ORAL | Status: DC | PRN

## 2020-03-13 MED ORDER — SODIUM CHLORIDE 0.9 % IV SOLN: INTRAVENOUS | Status: AC

## 2020-03-13 MED ORDER — MIDAZOLAM HCL 2 MG/2ML IJ SOLN
INTRAMUSCULAR | Status: DC | PRN
  Administered 2020-03-13: 1 mg via INTRAVENOUS
  Administered 2020-03-13: 2 mg via INTRAVENOUS
  Administered 2020-03-13: 1 mg via INTRAVENOUS

## 2020-03-13 MED ORDER — SODIUM CHLORIDE 0.9% FLUSH: 3.0000 mL | Freq: Two times a day (BID) | INTRAVENOUS | Status: DC

## 2020-03-13 MED ORDER — LIDOCAINE HCL (PF) 1 % IJ SOLN
INTRAMUSCULAR | Status: AC
  Filled 2020-03-13: qty 30

## 2020-03-13 MED ORDER — HYDROMORPHONE HCL 1 MG/ML IJ SOLN
INTRAMUSCULAR | Status: DC | PRN
  Administered 2020-03-13 (×3): 0.5 mg via INTRAVENOUS

## 2020-03-13 MED ORDER — ENOXAPARIN SODIUM 80 MG/0.8ML ~~LOC~~ SOLN
1.0000 mg/kg | Freq: Two times a day (BID) | SUBCUTANEOUS | Status: DC
  Filled 2020-03-13: qty 0.8

## 2020-03-13 MED ORDER — HEPARIN (PORCINE) IN NACL 1000-0.9 UT/500ML-% IV SOLN
INTRAVENOUS | Status: DC | PRN
  Administered 2020-03-13: 500 mL

## 2020-03-13 MED ORDER — IODIXANOL 320 MG/ML IV SOLN
INTRAVENOUS | Status: DC | PRN
  Administered 2020-03-13: 45 mL via INTRAVENOUS

## 2020-03-13 MED ORDER — LIDOCAINE HCL (PF) 1 % IJ SOLN
INTRAMUSCULAR | Status: DC | PRN
  Administered 2020-03-13: 20 mL

## 2020-03-13 SURGICAL SUPPLY — 23 items
BAG SNAP BAND KOVER 36X36 (MISCELLANEOUS) ×4 IMPLANT
BALLN ATLAS 16X40X75 (BALLOONS) ×4
BALLN ATLAS PTA 18X40X75 8FR (BALLOONS) ×4
BALLOON ATLAS 16X40X75 (BALLOONS) ×3 IMPLANT
BALLOON ATLAS PTA 18X40X75 8FR (BALLOONS) ×3 IMPLANT
CATH ANGIO 5F BER 100CM (CATHETERS) ×4 IMPLANT
CATH INFINITI VERT 5FR 125CM (CATHETERS) ×4 IMPLANT
CATH RETRIEVER CLOT 16MMX105CM (CATHETERS) ×4 IMPLANT
CATH VISIONS PV .035 IVUS (CATHETERS) ×4 IMPLANT
COVER DOME SNAP 22 D (MISCELLANEOUS) ×4 IMPLANT
GLIDEWIRE ADV .035X260CM (WIRE) ×4 IMPLANT
KIT ENCORE 40 (KITS) ×4 IMPLANT
KIT MICROPUNCTURE NIT STIFF (SHEATH) ×4 IMPLANT
PROTECTION STATION PRESSURIZED (MISCELLANEOUS) ×4
SHEATH CLOT RETRIEVER (SHEATH) ×4 IMPLANT
SHEATH PINNACLE 5F 10CM (SHEATH) ×4 IMPLANT
SHEATH PINNACLE 8F 10CM (SHEATH) ×4 IMPLANT
SHEATH PROBE COVER 6X72 (BAG) ×4 IMPLANT
STATION PROTECTION PRESSURIZED (MISCELLANEOUS) ×3 IMPLANT
STENT WALLSTENT 18X90X75 (Permanent Stent) ×8 IMPLANT
TRAY PV CATH (CUSTOM PROCEDURE TRAY) ×4 IMPLANT
WIRE AMPLATZ SS-J .035X260CM (WIRE) ×4 IMPLANT
WIRE HITORQ VERSACORE ST 145CM (WIRE) ×4 IMPLANT

## 2020-03-13 NOTE — Op Note (Signed)
Procedure: 1.  Left lower extremity popliteal superficial femoral common femoral external iliac common iliac inferior vena cava ascending venogram  2.  Inari mechanical thrombectomy left common iliac external iliac common femoral superficial femoral popliteal vein  3.  Stent left common external iliac and common femoral vein (18 x 90 Wallstent x2)  4.  IVUS inferior vena cava left common iliac external iliac common femoral superficial femoral popliteal vein  Preoperative diagnosis: Acute left iliofemoral DVT  Postoperative diagnosis: Same  Anesthesia: Local with IV sedation, sedation time was 128 minutes  Contrast: 45 cc  Operative findings:   1.  Diffuse thrombus popliteal vein through the left common iliac vein  2.  80% stenosis mid left common external iliac vein stented to 0% residual stenosis  Operative details: After obtaining form consent, the patient was taken the [facility].  The patient placed in prone position on the angio table.  Next patient's left posterior popliteal area was prepped and draped in usual sterile fashion.  Ultrasound was used to identify the patient's popliteal vein with compressibility.  Local anesthesia was infiltrated over this.  A micropuncture needle was used to cannulate the popliteal vein under ultrasound guidance.  Micropuncture wire was placed through the needle and the micropuncture sheath placed over this.  An 80 Bentson wire was then pushed up into the distal common femoral vein under fluoroscopic guidance through the micropuncture sheath.  The micropuncture sheath was placed through the micropuncture sheath.  And 035 Berenstein catheter was then advanced up to the level of the common femoral but the Bentson wire would not go completely through the iliac vein so this was swapped out for an 035 Glidewire advantage which I was available to advance all the way up to the left subclavian vein axillary vein junction.  The sheath was then swapped out for a 5  [place] sheath..  This was then upsized for an 8 [place] sheath.  The patient was given a total of 25,000 units of heparin during the course of the case to try to maintain an ACT greater than 250.  Next the IVUS catheter was placed over a swapped out 035 Amplatz wire with its distal tip in the left subclavian vein.  IVUS was performed from the inferior vena cava just below the renal vein all the way down to the popliteal vein.  This showed thrombus and diffuse narrowing of the right common iliac and external iliac with the tightest area of narrowing at the sacroiliac joint.  There was also diffuse thrombus all the way down to the popliteal vein.  At this point the sheath was swapped out over the guidewire for the [place] sheath.  The NRA device was then secured over the guidewire under fluoroscopic guidance.  The basket was then deployed and the clot treated were brought through the inferior vena cava common iliac external iliac common femoral superficial femoral and popliteal veins.  This was done on 3 separate passes and an abundant amount of thrombus was retrieved.  IVUS was then performed and about 90% of the thrombus had been removed.  At this point IVUS was used to mark the origin of the left common iliac vein as well as the left external iliac and common femoral vein.  There was a diffusely narrowed segment extending from the common iliac origin all the way to the mid external iliac vein.  This was marked for roadmapping.  The entire right common iliac and external iliac artery was then predilated with a 16  x 40 balloon.  This was inflated to nominal pressure for 1 minute on each ablation.  Then proceeded to place an 18 x 90 Wallstent from the left common iliac vein origin and then overlapped this with an additional 18 x 90 Wallstent all the way down into the common femoral vein.  These were then postdilated initially with the 16 x 40 balloon to nominal pressure.  IVUS was then repeated and there was still some  slight narrowing at the common iliac vein origin and at the narrowed segment at the [facility] joint.  An 18 x 40 balloon was then advanced and multiple overlapping inflations to 6 atm of pressure to fully deploy the stent.  Final contrast venogram showed wide patency of the stent with brisk flow into the inferior vena cava.  At this point the Amplatz wire was removed.  The sheath was removed and hemostasis obtained with direct pressure.  The patient tolerated procedure well and there were no complications.  The patient was taken to the holding in stable condition.  Operative management: The patient will be given a therapeutic dose of Lovenox at 11 AM today.  He will then take his scheduled Lovenox at 7 PM this evening.  It was emphasized to the patient that he needs to make sure he is taking every Lovenox dose to maintain patency of the stent.  We will schedule the patient for follow-up visit with me in 2 to 3 weeks with a duplex ultrasound.  Ruta Hinds, MD [facility] Office: [phone]

## 2020-03-13 NOTE — Discharge Instructions (Signed)
Catheter-Directed Thrombolysis, Care After This sheet gives you information about how to care for yourself after your procedure. Your health care provider may also give you more specific instructions. If you have problems or questions, contact your health care provider. What can I expect after the procedure? After the procedure, it is common to have mild pain around your incision. Follow these instructions at home: Incision care   Follow instructions from your health care provider about how to take care of your incision. Make sure you: ? Wash your hands with soap and water before and after you change your bandage (dressing). If soap and water are not available, use hand sanitizer. ? Change your dressing as told by your health care provider.  Keep the incision area clean and dry.  Do not take baths, swim, or use a hot tub until your health care provider approves. Ask your health care provider if you may take showers. You may only be allowed to take sponge baths.  Check your incision area every day for signs of infection. Check for: ? Redness, swelling, or more pain. ? Fluid or blood. ? Warmth. ? Pus or a bad smell. Medicines  Take over-the-counter and prescription medicines only as told by your health care provider.  If you are taking blood thinners: ? Talk with your health care provider before you take any medicines that contain aspirin or NSAIDs, such as ibuprofen. These medicines increase your risk for dangerous bleeding. ? Take your medicine exactly as told, at the same time every day. ? Avoid activities that could cause injury or bruising, and follow instructions about how to prevent falls. ? Wear a medical alert bracelet or carry a card that lists what medicines you take. Activity   Do not drive until your health care provider approves.  Return to your normal activities as told by your health care provider. Ask your health care provider what activities are safe for  you. General instructions   Raise (elevate) your legs above the level of your heart when sitting or lying down. You can do this by putting pillows under your legs.  Wear compression stockings as told by your health care provider. These stockings help to prevent blood clots and reduce swelling in your legs.  Drink enough fluid to keep your urine pale yellow.  Do not use any products that contain nicotine or tobacco, such as cigarettes, e-cigarettes, and chewing tobacco. If you need help quitting, ask your health care provider.  Keep all follow-up visits as told by your health care provider. This is important. Contact a health care provider if:  You have redness, swelling, or more pain around your incision.  You have fluid or blood coming from your incision.  Your incision feels warm to the touch.  You have pus or a bad smell coming from your incision.  You have chills or a fever.  You bleed or bruise easily.  You have blood in your urine or stool. Get help right away if:  You have any symptoms of a stroke. "BE FAST" is an easy way to remember the main warning signs of a stroke: ? B - Balance. Signs are dizziness, sudden trouble walking, or loss of balance. ? E - Eyes. Signs are trouble seeing or a sudden change in vision. ? F - Face. Signs are sudden weakness or numbness of the face, or the face or eyelid drooping on one side. ? A - Arms. Signs are weakness or numbness in an arm. This happens suddenly  and usually on one side of the body. ? S - Speech. Signs are sudden trouble speaking, slurred speech, or trouble understanding what people say. ? T - Time. Time to call emergency services. Write down what time symptoms started.  You have other signs of a stroke, such as: ? A sudden, severe headache with no known cause. ? Nausea or vomiting. ? Seizure.  You have bleeding that does not stop after you apply pressure with your hands for several minutes.  You have chest  pain.  You have difficulty breathing.  You cough up blood.  You have redness, warmth, swelling, and pain in an arm or leg. These symptoms may represent a serious problem that is an emergency. Do not wait to see if the symptoms will go away. Get medical help right away. Call your local emergency services (911 in the [place] Do not drive yourself to the hospital. Summary  After the procedure, it is common to have mild pain around your incision.  Follow instructions from your health care provider about how to take care of your incision.  If you are taking blood thinners, talk with your health care provider before you take any medicines that contain aspirin or NSAIDs. Follow instructions about how to prevent falls.  Contact your health care provider if you have signs of an infected incision, such as redness, swelling, or more pain.  Get help right away if you have signs of stroke. BE FAST is an easy way to remember the warning signs of stroke. This information is not intended to replace advice given to you by your health care provider. Make sure you discuss any questions you have with your health care provider. Document Revised: 12/10/2018 Document Reviewed: 12/10/2018 Elsevier Patient Education  [employer].

## 2020-03-13 NOTE — Interval H&P Note (Signed)
History and Physical Interval Note:  03/13/2020 7:24 AM  Brian Benton  has presented today for surgery, with the diagnosis of left leg swelling.  The various methods of treatment have been discussed with the patient and family. After consideration of risks, benefits and other options for treatment, the patient has consented to  Procedure(s): PERIPHERAL VASCULAR THROMBECTOMY (N/A) as a surgical intervention.  The patient's history has been reviewed, patient examined, no change in status, stable for surgery.  I have reviewed the patient's chart and labs.  Questions were answered to the patient's satisfaction.     Ruta Hinds

## 2020-03-13 NOTE — Progress Notes (Signed)
Patient and wife was given discharge instructions. Both verbalized understanding. 

## 2020-03-15 MED FILL — Heparin Sod (Porcine)-NaCl IV Soln 1000 Unit/500ML-0.9%: INTRAVENOUS | Qty: 500 | Status: AC

## 2020-03-16 ENCOUNTER — Ambulatory Visit: Payer: 59

## 2020-03-16 ENCOUNTER — Other Ambulatory Visit: Payer: Self-pay

## 2020-03-16 ENCOUNTER — Ambulatory Visit
Admission: RE | Admit: 2020-03-16 | Discharge: 2020-03-16 | Disposition: A | Discharge status: Home / Self Care | Payer: 59 | Source: Ambulatory Visit | Attending: Radiation Oncology | Admitting: Radiation Oncology

## 2020-03-16 DIAGNOSIS — Z510 Encounter for antineoplastic radiation therapy: Secondary | ICD-10-CM | POA: Diagnosis not present

## 2020-03-17 ENCOUNTER — Other Ambulatory Visit: Payer: Self-pay | Admitting: *Deleted

## 2020-03-17 ENCOUNTER — Ambulatory Visit: Payer: 59

## 2020-03-17 ENCOUNTER — Ambulatory Visit
Admission: RE | Admit: 2020-03-17 | Discharge: 2020-03-17 | Disposition: A | Discharge status: Home / Self Care | Payer: 59 | Source: Ambulatory Visit | Attending: Radiation Oncology | Admitting: Radiation Oncology

## 2020-03-17 ENCOUNTER — Other Ambulatory Visit: Payer: Self-pay

## 2020-03-17 DIAGNOSIS — C799 Secondary malignant neoplasm of unspecified site: Secondary | ICD-10-CM

## 2020-03-17 DIAGNOSIS — M7989 Other specified soft tissue disorders: Secondary | ICD-10-CM

## 2020-03-17 DIAGNOSIS — Z510 Encounter for antineoplastic radiation therapy: Secondary | ICD-10-CM | POA: Diagnosis not present

## 2020-03-18 ENCOUNTER — Inpatient Hospital Stay: Payer: 59 | Attending: Hematology & Oncology

## 2020-03-18 ENCOUNTER — Other Ambulatory Visit: Payer: Self-pay | Admitting: Hematology & Oncology

## 2020-03-18 ENCOUNTER — Inpatient Hospital Stay: Payer: 59

## 2020-03-18 ENCOUNTER — Encounter: Payer: Self-pay | Admitting: *Deleted

## 2020-03-18 ENCOUNTER — Other Ambulatory Visit: Payer: Self-pay

## 2020-03-18 ENCOUNTER — Ambulatory Visit: Payer: 59

## 2020-03-18 ENCOUNTER — Ambulatory Visit
Admission: RE | Admit: 2020-03-18 | Discharge: 2020-03-18 | Disposition: A | Discharge status: Home / Self Care | Payer: 59 | Source: Ambulatory Visit | Attending: Radiation Oncology | Admitting: Radiation Oncology

## 2020-03-18 ENCOUNTER — Encounter: Payer: Self-pay | Admitting: Hematology & Oncology

## 2020-03-18 ENCOUNTER — Inpatient Hospital Stay (HOSPITAL_BASED_OUTPATIENT_CLINIC_OR_DEPARTMENT_OTHER): Payer: 59 | Admitting: Hematology & Oncology

## 2020-03-18 VITALS — BP 117/54 | HR 86 | Temp 98.5°F | Resp 18 | Wt 170.0 lb

## 2020-03-18 DIAGNOSIS — C679 Malignant neoplasm of bladder, unspecified: Secondary | ICD-10-CM | POA: Insufficient documentation

## 2020-03-18 DIAGNOSIS — C799 Secondary malignant neoplasm of unspecified site: Secondary | ICD-10-CM

## 2020-03-18 DIAGNOSIS — Z5111 Encounter for antineoplastic chemotherapy: Secondary | ICD-10-CM | POA: Insufficient documentation

## 2020-03-18 DIAGNOSIS — Z510 Encounter for antineoplastic radiation therapy: Secondary | ICD-10-CM | POA: Diagnosis not present

## 2020-03-18 LAB — CMP (CANCER CENTER ONLY)
ALT: 19 U/L (ref 0–44)
AST: 14 U/L — ABNORMAL LOW (ref 15–41)
Albumin: 3.6 g/dL (ref 3.5–5.0)
Alkaline Phosphatase: 90 U/L (ref 38–126)
Anion gap: 8 (ref 5–15)
BUN: 22 mg/dL (ref 8–23)
CO2: 27 mmol/L (ref 22–32)
Calcium: 9.5 mg/dL (ref 8.9–10.3)
Chloride: 94 mmol/L — ABNORMAL LOW (ref 98–111)
Creatinine: 0.9 mg/dL (ref 0.61–1.24)
GFR, Estimated: >60 mL/min (ref >60)
Glucose, Bld: 121 mg/dL — ABNORMAL HIGH (ref 70–99)
Potassium: 4.2 mmol/L (ref 3.5–5.1)
Sodium: 129 mmol/L — ABNORMAL LOW (ref 135–145)
Total Bilirubin: 0.5 mg/dL (ref 0.3–1.2)
Total Protein: 6.9 g/dL (ref 6.5–8.1)

## 2020-03-18 LAB — CBC WITH DIFFERENTIAL (CANCER CENTER ONLY)
Abs Immature Granulocytes: 0.29 10*3/uL — ABNORMAL HIGH (ref 0.00–0.07)
Basophils Absolute: 0 10*3/uL (ref 0.0–0.1)
Basophils Relative: 0 %
Eosinophils Absolute: 0.4 10*3/uL (ref 0.0–0.5)
Eosinophils Relative: 4 %
HCT: 26.4 % — ABNORMAL LOW (ref 39.0–52.0)
Hemoglobin: 8.8 g/dL — ABNORMAL LOW (ref 13.0–17.0)
Immature Granulocytes: 3 %
Lymphocytes Relative: 9 %
Lymphs Abs: 0.9 10*3/uL (ref 0.7–4.0)
MCH: 31.5 pg (ref 26.0–34.0)
MCHC: 33.3 g/dL (ref 30.0–36.0)
MCV: 94.6 fL (ref 80.0–100.0)
Monocytes Absolute: 1 10*3/uL (ref 0.1–1.0)
Monocytes Relative: 10 %
Neutro Abs: 7.4 10*3/uL (ref 1.7–7.7)
Neutrophils Relative %: 74 %
Platelet Count: 272 10*3/uL (ref 150–400)
RBC: 2.79 MIL/uL — ABNORMAL LOW (ref 4.22–5.81)
RDW: 14.9 % (ref 11.5–15.5)
WBC Count: 10 10*3/uL (ref 4.0–10.5)
nRBC: 0 % (ref 0.0–0.2)

## 2020-03-18 MED ORDER — SODIUM CHLORIDE 0.9 % IV SOLN
10.0000 mg | Freq: Once | INTRAVENOUS | Status: AC
  Administered 2020-03-18: 10 mg via INTRAVENOUS
  Filled 2020-03-18: qty 10

## 2020-03-18 MED ORDER — SODIUM CHLORIDE 0.9 % IV SOLN
150.0000 mg | Freq: Once | INTRAVENOUS | Status: AC
  Administered 2020-03-18: 150 mg via INTRAVENOUS
  Filled 2020-03-18: qty 150

## 2020-03-18 MED ORDER — SODIUM CHLORIDE 0.9% FLUSH
10.0000 mL | INTRAVENOUS | Status: DC | PRN
  Filled 2020-03-18: qty 10

## 2020-03-18 MED ORDER — SODIUM CHLORIDE 0.9 % IV SOLN
35.0000 mg/m2 | Freq: Once | INTRAVENOUS | Status: AC
  Administered 2020-03-18: 71 mg via INTRAVENOUS
  Filled 2020-03-18: qty 71

## 2020-03-18 MED ORDER — PALONOSETRON HCL INJECTION 0.25 MG/5ML: 0.2500 mg | Freq: Once | INTRAVENOUS | Status: DC

## 2020-03-18 MED ORDER — HEPARIN SOD (PORK) LOCK FLUSH 100 UNIT/ML IV SOLN
500.0000 [IU] | Freq: Once | INTRAVENOUS | Status: DC | PRN
  Filled 2020-03-18: qty 5

## 2020-03-18 MED ORDER — SODIUM CHLORIDE 0.9 % IV SOLN
Freq: Once | INTRAVENOUS | Status: AC
  Filled 2020-03-18: qty 10

## 2020-03-18 MED ORDER — SODIUM CHLORIDE 0.9 % IV SOLN
Freq: Once | INTRAVENOUS | Status: AC
  Filled 2020-03-18: qty 250

## 2020-03-18 MED FILL — LORazepam 0.5 MG TABS: 0.5 | 8 days supply | Qty: 30 | Fill #1

## 2020-03-18 MED FILL — ENOXAPARIN SODIUM 80 MG/0.8: 80 | 12 days supply | Qty: 19 | Fill #0

## 2020-03-18 NOTE — Progress Notes (Signed)
Hematology and Oncology Follow Up Visit  Brian Benton [id number] [birth year] 64 y.o. 03/18/2020   Principle Diagnosis:   Metastatic bladder cancer-localized disease  Extensive thrombus of the left leg-status post thrombectomy and iliac stent  Current Therapy:    Curative radiation with weekly cis-platinum-status post 1 cycle of weekly cis-platinum  Lovenox 80 mg subcu twice daily     Interim History:  Brian Benton is back for follow-up.  The actually is his second office visit.  He first saw him back on 02/25/2020.  At that time, we are not sure what type of tumor he had in his pelvis.  He had to be hospitalized because of an extensive thrombus in his left leg.  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  In addition, the tumor was present on his veins.  In the hospital, he did have a biopsy of the tumor.  The pathology report ([id number]) shockingly showed metastatic urothelial cancer.  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  He has started radiation with weekly cis-platinum.  Since this pelvic recurrence has not been the only site of disease, we can are trying for the curative therapy.  He recently underwent a stent placement in the left iliac vein and also thrombectomy.  He had a 80% stenosis.  After stenting, he had 0% residual stenosis.  He had diffuse thrombus from the popliteal vein through the left common iliac vein.  His left leg is still swollen.  Hopefully the swelling will improve.  He feels well.  The pain is improving in the pelvis.  He can lie on his back now.  He has had no nausea or vomiting.  He has had no bleeding.  He has had no cough or shortness of breath.  Overall, his performance status is ECOG 1.   Medications:  Current Outpatient Medications:  .  ASPIRIN ADULT LOW STRENGTH 81 MG EC tablet, TAKE 1 TABLET BY MOUTH EVERY DAY (Patient taking differently: Take 81 mg by mouth daily. ), Disp: 30 tablet, Rfl: 11 .  dexamethasone  (DECADRON) 4 MG tablet, Take two tablets (8 mg total) by mouth daily.  Take daily times three days starting the day after Cisplatin chemotherapy.  Take with food., Disp: 30 tablet, Rfl: 1 .  enoxaparin (LOVENOX) 80 MG/0.8ML injection, Inject 0.8 mLs (80 mg total) into the skin every 12 (twelve) hours., Disp: 48 mL, Rfl: 0 .  fentaNYL (DURAGESIC) 50 MCG/HR, Place 1 patch onto the skin every other day. , Disp: , Rfl:  .  gabapentin (NEURONTIN) 600 MG tablet, Take 1 tablet (600 mg total) by mouth in the morning, at noon, in the evening, and at bedtime., Disp: 120 tablet, Rfl: 3 .  LORazepam (ATIVAN) 0.5 MG tablet, Take 1 tablet (0.5 mg total) by mouth every 6 (six) hours as needed for anxiety., Disp: 30 tablet, Rfl: 1 .  meloxicam (MOBIC) 15 MG tablet, Take 15 mg by mouth daily., Disp: , Rfl:  .  ondansetron (ZOFRAN) 8 MG tablet, Take 1 tablet (8 mg total) by mouth 2 (two) times daily as needed for nausea or vomiting. Start on third day after Cisplatin chemotherapy, Disp: 30 tablet, Rfl: 1 .  oxyCODONE 10 MG TABS, Take 1 tablet (10 mg total) by mouth every 4 (four) hours as needed for severe pain., Disp: 90 tablet, Rfl: 0 .  prochlorperazine (COMPAZINE) 10 MG tablet, Take 1 tablet (10 mg total) by mouth every 6 (six) hours as needed for  nausea or vomiting., Disp: 30 tablet, Rfl: 1 .  zolpidem (AMBIEN) 10 MG tablet, Take 1 tablet (10 mg total) by mouth at bedtime as needed for sleep., Disp: 30 tablet, Rfl: 0  Allergies: No Known Allergies  Past Medical History, Surgical history, Social history, and Family History were reviewed and updated.  Review of Systems: Review of Systems  Constitutional: Negative.   HENT:  Negative.   Eyes: Negative.   Respiratory: Negative.   Cardiovascular: Positive for leg swelling.  Gastrointestinal: Negative.   Endocrine: Negative.   Musculoskeletal: Positive for flank pain.  Skin: Negative.   Neurological: Negative.   Hematological: Negative.    Psychiatric/Behavioral: Negative.     Physical Exam:  weight is 170 lb (77.1 kg). His oral temperature is 98.5 F (36.9 C). His blood pressure is 117/54 (abnormal) and his pulse is 86. His respiration is 18 and oxygen saturation is 100%.   Wt Readings from Last 3 Encounters:  03/18/20 170 lb (77.1 kg)  03/13/20 170 lb (77.1 kg)  03/06/20 176 lb 4.8 oz (80 kg)    Physical Exam Vitals reviewed.  HENT:     Head: Normocephalic and atraumatic.  Eyes:     Pupils: Pupils are equal, round, and reactive to light.  Cardiovascular:     Rate and Rhythm: Normal rate and regular rhythm.     Heart sounds: Normal heart sounds.  Pulmonary:     Effort: Pulmonary effort is normal.     Breath sounds: Normal breath sounds.  Abdominal:     General: Bowel sounds are normal.     Palpations: Abdomen is soft.  Musculoskeletal:        General: No tenderness or deformity. Normal range of motion.     Cervical back: Normal range of motion.     Comments: Extremities shows significant swelling of the left leg.  This is mostly in the thigh.  His left lower leg has decreased swelling.  No palpable venous cord is noted.  Right leg is unremarkable.  Lymphadenopathy:     Cervical: No cervical adenopathy.  Skin:    General: Skin is warm and dry.     Findings: No erythema or rash.  Neurological:     Mental Status: He is alert and oriented to person, place, and time.  Psychiatric:        Behavior: Behavior normal.        Thought Content: Thought content normal.        Judgment: Judgment normal.    Lab Results  Component Value Date   WBC 10.0 03/18/2020   HGB 8.8 (L) 03/18/2020   HCT 26.4 (L) 03/18/2020   MCV 94.6 03/18/2020   PLT 272 03/18/2020     Chemistry      Component Value Date/Time   NA 129 (L) 03/18/2020 0858   K 4.2 03/18/2020 0858   CL 94 (L) 03/18/2020 0858   CO2 27 03/18/2020 0858   BUN 22 03/18/2020 0858   CREATININE 0.90 03/18/2020 0858      Component Value Date/Time   CALCIUM  9.5 03/18/2020 0858   ALKPHOS 90 03/18/2020 0858   AST 14 (L) 03/18/2020 0858   ALT 19 03/18/2020 0858   BILITOT 0.5 03/18/2020 0858       Impression and Plan: Brian Benton is a really nice 64 year old white male.  He has a very interesting problem.  He had in situ bladder cancer about 5 years ago.  Now, he has a recurrence that I would consider  metastatic.  This is outside of the bladder.  It is not operable as it is appear to be invading to the psoas muscle and also involving his lower spine.  However, this is truly oligometastatic disease.  We are trying for curative therapy.  He is having radiation with weekly cis-platinum.  We probably will need full dose treatment afterwards to help prevent micrometastatic disease.  We did send off molecular markers on the tumor.  I do not have these back yet.  He will continue the Lovenox for his thrombus in the left leg.  I will switch him over to an oral agent after he has his treatments for radiation and weekly chemotherapy.  We will plan to him back to see [place] in another 3 or 4 weeks.  I am just glad that he has had aggressive therapy to date.   Volanda Napoleon, MD 10/6/202110:41 AM

## 2020-03-18 NOTE — Progress Notes (Signed)
Patient is in treatment. He is doing well. He had minimal complaints after his last treatment. His radiation is going well. He states his left leg is doing better after the procedure, but continues to have issues with swelling. Currently without questions or concerns. He knows to reach out as needed.  Oncology Nurse Navigator Documentation  Oncology Nurse Navigator Flowsheets 03/18/2020  Abnormal Finding Date -  Confirmed Diagnosis Date -  Diagnosis Status -  Planned Course of Treatment -  Phase of Treatment -  Chemo/Radiation Concurrent Actual Start Date: -  Navigator Follow Up Date: 04/08/2020  Navigator Follow Up Reason: Follow-up Appointment;Chemotherapy  Navigator Location [facility]  Referral Date to RadOnc/MedOnc -  Navigator Encounter Type Treatment  Telephone -  Treatment Initiated Date -  Patient Visit Type MedOnc  Treatment Phase Active Tx  Barriers/Navigation Needs Coordination of Care;Education  Education -  Interventions Psycho-Social Support  Acuity Level 2-Minimal Needs (1-2 Barriers Identified)  Referrals -  Coordination of Care -  Education Method -  Support Groups/Services Friends and Family  Time Spent with Patient 30

## 2020-03-18 NOTE — Progress Notes (Signed)
Per Dr. Marin Olp ok to run post cisplatin hydration fluids with cisplatin during this treatment.

## 2020-03-19 ENCOUNTER — Encounter (HOSPITAL_COMMUNITY)
Admission: RE | Admit: 2020-03-19 | Discharge: 2020-03-19 | Disposition: A | Discharge status: Home / Self Care | Payer: 59 | Source: Ambulatory Visit | Attending: Hematology & Oncology | Admitting: Hematology & Oncology

## 2020-03-19 ENCOUNTER — Ambulatory Visit: Payer: 59

## 2020-03-19 ENCOUNTER — Other Ambulatory Visit: Payer: Self-pay

## 2020-03-19 ENCOUNTER — Ambulatory Visit
Admission: RE | Admit: 2020-03-19 | Discharge: 2020-03-19 | Disposition: A | Discharge status: Home / Self Care | Payer: 59 | Source: Ambulatory Visit | Attending: Radiation Oncology | Admitting: Radiation Oncology

## 2020-03-19 DIAGNOSIS — Z510 Encounter for antineoplastic radiation therapy: Secondary | ICD-10-CM | POA: Diagnosis not present

## 2020-03-19 DIAGNOSIS — C679 Malignant neoplasm of bladder, unspecified: Secondary | ICD-10-CM | POA: Diagnosis present

## 2020-03-19 DIAGNOSIS — C799 Secondary malignant neoplasm of unspecified site: Secondary | ICD-10-CM | POA: Diagnosis present

## 2020-03-19 LAB — GLUCOSE, CAPILLARY: Glucose-Capillary: 140 mg/dL — ABNORMAL HIGH (ref 70–99)

## 2020-03-19 MED ORDER — FLUDEOXYGLUCOSE F - 18 (FDG) INJECTION
8.8200 | Freq: Once | INTRAVENOUS | Status: AC
  Administered 2020-03-19: 8.82 via INTRAVENOUS

## 2020-03-20 ENCOUNTER — Encounter: Payer: Self-pay | Admitting: *Deleted

## 2020-03-20 ENCOUNTER — Other Ambulatory Visit: Payer: Self-pay | Admitting: *Deleted

## 2020-03-20 ENCOUNTER — Other Ambulatory Visit: Payer: Self-pay | Admitting: Hematology & Oncology

## 2020-03-20 ENCOUNTER — Telehealth: Payer: Self-pay

## 2020-03-20 ENCOUNTER — Ambulatory Visit: Payer: 59

## 2020-03-20 ENCOUNTER — Ambulatory Visit
Admission: RE | Admit: 2020-03-20 | Discharge: 2020-03-20 | Disposition: A | Discharge status: Home / Self Care | Payer: 59 | Source: Ambulatory Visit | Attending: Radiation Oncology | Admitting: Radiation Oncology

## 2020-03-20 DIAGNOSIS — Z510 Encounter for antineoplastic radiation therapy: Secondary | ICD-10-CM | POA: Diagnosis not present

## 2020-03-20 DIAGNOSIS — I82402 Acute embolism and thrombosis of unspecified deep veins of left lower extremity: Secondary | ICD-10-CM

## 2020-03-20 MED ORDER — MELOXICAM 15 MG PO TABS
15.0000 mg | ORAL_TABLET | Freq: Every day | ORAL | 3 refills | Status: DC
Start: 2020-03-20 — End: 2020-03-20

## 2020-03-20 MED ORDER — ENOXAPARIN SODIUM 80 MG/0.8ML ~~LOC~~ SOLN: 1.0000 mg/kg | Freq: Two times a day (BID) | SUBCUTANEOUS | 0 refills | Status: DC

## 2020-03-20 MED ORDER — OXYCODONE HCL 10 MG PO TABS
10.0000 mg | ORAL_TABLET | ORAL | 0 refills | Status: DC | PRN
Start: 2020-03-20 — End: 2020-05-01

## 2020-03-20 MED ORDER — LORAZEPAM 0.5 MG PO TABS: 0.5000 mg | ORAL_TABLET | Freq: Four times a day (QID) | ORAL | 1 refills | Status: DC | PRN

## 2020-03-20 MED FILL — oxyCODONE HCL 10 MG TABS: 10 | 15 days supply | Qty: 90 | Fill #0

## 2020-03-20 NOTE — Progress Notes (Signed)
Reviewed patient PET scan results with Dr Marin Olp. He states PET scan impression is what was expected, with no evidence of metastatic disease.   Called patient and reviewed results with him. Several questions answered to his satisfaction. He also mentions that his Left leg has had increased swelling in the last 24h. He has called his vascular surgeon, but wanted to make sure we knew as well. Encouraged patient to keep well hydrated, to keep leg elevated, avoid tight clothing or crossing his legs, and to make sure he kept in touch with his vascular MD. He agreed.   Oncology Nurse Navigator Documentation  Oncology Nurse Navigator Flowsheets 03/20/2020  Abnormal Finding Date -  Confirmed Diagnosis Date -  Diagnosis Status -  Planned Course of Treatment -  Phase of Treatment -  Chemo/Radiation Concurrent Actual Start Date: -  Navigator Follow Up Date: 04/08/2020  Navigator Follow Up Reason: Follow-up Appointment  Navigator Location [facility]  Referral Date to RadOnc/MedOnc -  Navigator Encounter Type Telephone;Scan Review  Telephone -  Treatment Initiated Date -  Patient Visit Type MedOnc  Treatment Phase Active Tx  Barriers/Navigation Needs Coordination of Care;Education  Education Other  Interventions Education;Psycho-Social Support  Acuity Level 2-Minimal Needs (1-2 Barriers Identified)  Referrals -  Coordination of Care -  Education Method Verbal  Support Groups/Services Friends and Family  Time Spent with Patient 27

## 2020-03-20 NOTE — Progress Notes (Signed)
Received a voicemail from patient stating his right leg is now starting to swell. The left leg swelling is continued, but now the right leg, which was normal early this morning is now swollen, more so in the foot, but it's travelling up the leg.   Spoke to Dr Marin Olp who wants to make sure the patient is communicating with his vascular MD. Also states patient is on anticoagulation; verify that patient is taking a prescribed.   Called and spoke to the patient's son, Lanny Hurst. He states the swelling in patient's right leg is significant from this morning. They did speak to the vascular MD but he wasn't concerned, and stated he would follow up with them next week.   Again reviewed that patient should elevate legs, drink plenty of water and avoid crossing legs and tight clothing. Confirmed that he is taking lovenox BID. Encouraged patient to be active for about 10 minutes each hour by walking around his home, but not to do more than he could handle.   If patient become short of breath, experiences moderate to severe pain in that leg, has decreased sensation, or if the leg become cold, instructed the son to take him to the ED. He understood. He has an appointment Monday morning at radiation, where I advised them to have patient assessed and see if they have any concerns.   Oncology Nurse Navigator Documentation  Oncology Nurse Navigator Flowsheets 03/20/2020  Abnormal Finding Date -  Confirmed Diagnosis Date -  Diagnosis Status -  Planned Course of Treatment -  Phase of Treatment -  Chemo/Radiation Concurrent Actual Start Date: -  Navigator Follow Up Date: 04/08/2020  Navigator Follow Up Reason: Follow-up Appointment  Navigator Location [facility]  Referral Date to RadOnc/MedOnc -  Navigator Encounter Type Telephone  Telephone Incoming Call;Symptom Mgt  Treatment Initiated Date -  Patient Visit Type MedOnc  Treatment Phase Active Tx  Barriers/Navigation Needs Coordination of Care;Education   Education Pain/ Symptom Management  Interventions Education;Psycho-Social Support  Acuity Level 2-Minimal Needs (1-2 Barriers Identified)  Referrals -  Coordination of Care -  Education Method Verbal  Support Groups/Services Friends and Family  Time Spent with Patient 11

## 2020-03-20 NOTE — Telephone Encounter (Signed)
Patient called c/o increase swelling and pain. Instructed to continue elevation and tylenol. Patient has placed a call to [facility] who he saw 2 days ago. Patient instructed to call back if any further assistance is needed. Patient verbalized understanding.

## 2020-03-23 ENCOUNTER — Ambulatory Visit: Payer: 59

## 2020-03-23 ENCOUNTER — Ambulatory Visit
Admission: RE | Admit: 2020-03-23 | Discharge: 2020-03-23 | Disposition: A | Discharge status: Home / Self Care | Payer: 59 | Source: Ambulatory Visit | Attending: Radiation Oncology | Admitting: Radiation Oncology

## 2020-03-23 DIAGNOSIS — Z510 Encounter for antineoplastic radiation therapy: Secondary | ICD-10-CM | POA: Diagnosis not present

## 2020-03-24 ENCOUNTER — Other Ambulatory Visit: Payer: Self-pay

## 2020-03-24 ENCOUNTER — Ambulatory Visit
Admission: RE | Admit: 2020-03-24 | Discharge: 2020-03-24 | Disposition: A | Discharge status: Home / Self Care | Payer: 59 | Source: Ambulatory Visit | Attending: Radiation Oncology | Admitting: Radiation Oncology

## 2020-03-24 ENCOUNTER — Ambulatory Visit: Payer: 59

## 2020-03-24 DIAGNOSIS — Z510 Encounter for antineoplastic radiation therapy: Secondary | ICD-10-CM | POA: Diagnosis not present

## 2020-03-24 MED FILL — fentaNYL 50 MCG/HR PT72: 50 | 30 days supply | Qty: 10 | Fill #0

## 2020-03-25 ENCOUNTER — Inpatient Hospital Stay: Payer: 59

## 2020-03-25 ENCOUNTER — Ambulatory Visit: Payer: 59

## 2020-03-25 ENCOUNTER — Ambulatory Visit
Admission: RE | Admit: 2020-03-25 | Discharge: 2020-03-25 | Disposition: A | Discharge status: Home / Self Care | Payer: 59 | Source: Ambulatory Visit | Attending: Radiation Oncology | Admitting: Radiation Oncology

## 2020-03-25 VITALS — BP 116/44 | HR 80 | Temp 99.2°F | Resp 18

## 2020-03-25 DIAGNOSIS — C679 Malignant neoplasm of bladder, unspecified: Secondary | ICD-10-CM

## 2020-03-25 DIAGNOSIS — Z5111 Encounter for antineoplastic chemotherapy: Secondary | ICD-10-CM | POA: Diagnosis not present

## 2020-03-25 DIAGNOSIS — Z510 Encounter for antineoplastic radiation therapy: Secondary | ICD-10-CM | POA: Diagnosis not present

## 2020-03-25 LAB — CBC WITH DIFFERENTIAL (CANCER CENTER ONLY)
Abs Immature Granulocytes: 0.06 10*3/uL (ref 0.00–0.07)
Basophils Absolute: 0 10*3/uL (ref 0.0–0.1)
Basophils Relative: 0 %
Eosinophils Absolute: 0.1 10*3/uL (ref 0.0–0.5)
Eosinophils Relative: 1 %
HCT: 27.5 % — ABNORMAL LOW (ref 39.0–52.0)
Hemoglobin: 8.9 g/dL — ABNORMAL LOW (ref 13.0–17.0)
Immature Granulocytes: 1 %
Lymphocytes Relative: 8 %
Lymphs Abs: 0.6 10*3/uL — ABNORMAL LOW (ref 0.7–4.0)
MCH: 31.4 pg (ref 26.0–34.0)
MCHC: 32.4 g/dL (ref 30.0–36.0)
MCV: 97.2 fL (ref 80.0–100.0)
Monocytes Absolute: 0.5 10*3/uL (ref 0.1–1.0)
Monocytes Relative: 6 %
Neutro Abs: 6.6 10*3/uL (ref 1.7–7.7)
Neutrophils Relative %: 84 %
Platelet Count: 246 10*3/uL (ref 150–400)
RBC: 2.83 MIL/uL — ABNORMAL LOW (ref 4.22–5.81)
RDW: 16 % — ABNORMAL HIGH (ref 11.5–15.5)
WBC Count: 7.8 10*3/uL (ref 4.0–10.5)
nRBC: 0 % (ref 0.0–0.2)

## 2020-03-25 LAB — CMP (CANCER CENTER ONLY)
ALT: 18 U/L (ref 0–44)
AST: 11 U/L — ABNORMAL LOW (ref 15–41)
Albumin: 3.6 g/dL (ref 3.5–5.0)
Alkaline Phosphatase: 74 U/L (ref 38–126)
Anion gap: 7 (ref 5–15)
BUN: 18 mg/dL (ref 8–23)
CO2: 26 mmol/L (ref 22–32)
Calcium: 9.5 mg/dL (ref 8.9–10.3)
Chloride: 101 mmol/L (ref 98–111)
Creatinine: 0.76 mg/dL (ref 0.61–1.24)
GFR, Estimated: >60 mL/min (ref >60)
Glucose, Bld: 99 mg/dL (ref 70–99)
Potassium: 4.3 mmol/L (ref 3.5–5.1)
Sodium: 134 mmol/L — ABNORMAL LOW (ref 135–145)
Total Bilirubin: 0.4 mg/dL (ref 0.3–1.2)
Total Protein: 6.7 g/dL (ref 6.5–8.1)

## 2020-03-25 LAB — LACTATE DEHYDROGENASE: LDH: 242 U/L — ABNORMAL HIGH (ref 98–192)

## 2020-03-25 MED ORDER — PALONOSETRON HCL INJECTION 0.25 MG/5ML
0.2500 mg | Freq: Once | INTRAVENOUS | Status: AC
  Administered 2020-03-25: 0.25 mg via INTRAVENOUS

## 2020-03-25 MED ORDER — SODIUM CHLORIDE 0.9 % IV SOLN
10.0000 mg | Freq: Once | INTRAVENOUS | Status: AC
  Administered 2020-03-25: 10 mg via INTRAVENOUS
  Filled 2020-03-25: qty 10

## 2020-03-25 MED ORDER — SODIUM CHLORIDE 0.9% FLUSH
10.0000 mL | INTRAVENOUS | Status: DC | PRN
  Filled 2020-03-25: qty 10

## 2020-03-25 MED ORDER — HEPARIN SOD (PORK) LOCK FLUSH 100 UNIT/ML IV SOLN
500.0000 [IU] | Freq: Once | INTRAVENOUS | Status: DC | PRN
  Filled 2020-03-25: qty 5

## 2020-03-25 MED ORDER — SODIUM CHLORIDE 0.9 % IV SOLN
35.0000 mg/m2 | Freq: Once | INTRAVENOUS | Status: AC
  Administered 2020-03-25: 71 mg via INTRAVENOUS
  Filled 2020-03-25: qty 71

## 2020-03-25 MED ORDER — PALONOSETRON HCL INJECTION 0.25 MG/5ML
INTRAVENOUS | Status: AC
  Filled 2020-03-25: qty 5

## 2020-03-25 MED ORDER — SODIUM CHLORIDE 0.9 % IV SOLN
150.0000 mg | Freq: Once | INTRAVENOUS | Status: AC
  Administered 2020-03-25: 150 mg via INTRAVENOUS
  Filled 2020-03-25: qty 150

## 2020-03-25 MED ORDER — SODIUM CHLORIDE 0.9 % IV SOLN
Freq: Once | INTRAVENOUS | Status: AC
  Filled 2020-03-25: qty 10

## 2020-03-25 MED ORDER — SODIUM CHLORIDE 0.9 % IV SOLN
Freq: Once | INTRAVENOUS | Status: AC
  Filled 2020-03-25: qty 250

## 2020-03-25 NOTE — Progress Notes (Signed)
Pt discharged in no apparent distress. Pt left ambulatory without assistance. Pt aware of discharge instructions and verbalized understanding and had no further questions.  

## 2020-03-25 NOTE — Patient Instructions (Signed)
Per Dr. Marin Olp ok to run post cisplatin hydration fluids with cisplatin during this treatment.  Cisplatin injection What is this medicine? CISPLATIN (SIS pla tin) is a chemotherapy drug. It targets fast dividing cells, like cancer cells, and causes these cells to die. This medicine is used to treat many types of cancer like bladder, ovarian, and testicular cancers. This medicine may be used for other purposes; ask your health care provider or pharmacist if you have questions. COMMON BRAND NAME(S): Platinol, Platinol -AQ What should I tell my health care provider before I take this medicine? They need to know if you have any of these conditions:  eye disease, vision problems  hearing problems  kidney disease  low blood counts, like white cells, platelets, or red blood cells  tingling of the fingers or toes, or other nerve disorder  an unusual or allergic reaction to cisplatin, carboplatin, oxaliplatin, other medicines, foods, dyes, or preservatives  pregnant or trying to get pregnant  breast-feeding How should I use this medicine? This drug is given as an infusion into a vein. It is administered in a hospital or clinic by a specially trained health care professional. Talk to your pediatrician regarding the use of this medicine in children. Special care may be needed. Overdosage: If you think you have taken too much of this medicine contact a poison control center or emergency room at once. NOTE: This medicine is only for you. Do not share this medicine with others. What if I miss a dose? It is important not to miss a dose. Call your doctor or health care professional if you are unable to keep an appointment. What may interact with this medicine? This medicine may interact with the following medications:  foscarnet  certain antibiotics like amikacin, gentamicin, neomycin, polymyxin B, streptomycin, tobramycin, vancomycin This list may not describe all possible interactions. Give  your health care provider a list of all the medicines, herbs, non-prescription drugs, or dietary supplements you use. Also tell them if you smoke, drink alcohol, or use illegal drugs. Some items may interact with your medicine. What should I watch for while using this medicine? Your condition will be monitored carefully while you are receiving this medicine. You will need important blood work done while you are taking this medicine. This drug may make you feel generally unwell. This is not uncommon, as chemotherapy can affect healthy cells as well as cancer cells. Report any side effects. Continue your course of treatment even though you feel ill unless your doctor tells you to stop. This medicine may increase your risk of getting an infection. Call your healthcare professional for advice if you get a fever, chills, or sore throat, or other symptoms of a cold or flu. Do not treat yourself. Try to avoid being around people who are sick. Avoid taking medicines that contain aspirin, acetaminophen, ibuprofen, naproxen, or ketoprofen unless instructed by your healthcare professional. These medicines may hide a fever. This medicine may increase your risk to bruise or bleed. Call your doctor or health care professional if you notice any unusual bleeding. Be careful brushing and flossing your teeth or using a toothpick because you may get an infection or bleed more easily. If you have any dental work done, tell your dentist you are receiving this medicine. Do not become pregnant while taking this medicine or for 14 months after stopping it. Women should inform their healthcare professional if they wish to become pregnant or think they might be pregnant. Men should not father a  child while taking this medicine and for 11 months after stopping it. There is potential for serious side effects to an unborn child. Talk to your healthcare professional for more information. Do not breast-feed an infant while taking this  medicine. This medicine has caused ovarian failure in some women. This medicine may make it more difficult to get pregnant. Talk to your healthcare professional if you are concerned about your fertility. This medicine has caused decreased sperm counts in some men. This may make it more difficult to father a child. Talk to your healthcare professional if you are concerned about your fertility. Drink fluids as directed while you are taking this medicine. This will help protect your kidneys. Call your doctor or health care professional if you get diarrhea. Do not treat yourself. What side effects may I notice from receiving this medicine? Side effects that you should report to your doctor or health care professional as soon as possible:  allergic reactions like skin rash, itching or hives, swelling of the face, lips, or tongue  blurred vision  changes in vision  decreased hearing or ringing of the ears  nausea, vomiting  pain, redness, or irritation at site where injected  pain, tingling, numbness in the hands or feet  signs and symptoms of bleeding such as bloody or black, tarry stools; red or dark brown urine; spitting up blood or brown material that looks like coffee grounds; red spots on the skin; unusual bruising or bleeding from the eyes, gums, or nose  signs and symptoms of infection like fever; chills; cough; sore throat; pain or trouble passing urine  signs and symptoms of kidney injury like trouble passing urine or change in the amount of urine  signs and symptoms of low red blood cells or anemia such as unusually weak or tired; feeling faint or lightheaded; falls; breathing problems Side effects that usually do not require medical attention (report to your doctor or health care professional if they continue or are bothersome):  loss of appetite  mouth sores  muscle cramps This list may not describe all possible side effects. Call your doctor for medical advice about side  effects. You may report side effects to FDA at 1-800-FDA-1088. Where should I keep my medicine? This drug is given in a hospital or clinic and will not be stored at home. NOTE: This sheet is a summary. It may not cover all possible information. If you have questions about this medicine, talk to your doctor, pharmacist, or health care provider.  2020 Elsevier/Gold Standard (2018-05-25 15:59:17)

## 2020-03-26 ENCOUNTER — Ambulatory Visit: Payer: 59

## 2020-03-26 ENCOUNTER — Ambulatory Visit
Admission: RE | Admit: 2020-03-26 | Discharge: 2020-03-26 | Disposition: A | Discharge status: Home / Self Care | Payer: 59 | Source: Ambulatory Visit | Attending: Radiation Oncology | Admitting: Radiation Oncology

## 2020-03-26 DIAGNOSIS — Z510 Encounter for antineoplastic radiation therapy: Secondary | ICD-10-CM | POA: Diagnosis not present

## 2020-03-27 ENCOUNTER — Ambulatory Visit
Admission: RE | Admit: 2020-03-27 | Discharge: 2020-03-27 | Disposition: A | Discharge status: Home / Self Care | Payer: 59 | Source: Ambulatory Visit | Attending: Radiation Oncology | Admitting: Radiation Oncology

## 2020-03-27 ENCOUNTER — Ambulatory Visit: Payer: 59

## 2020-03-27 DIAGNOSIS — Z510 Encounter for antineoplastic radiation therapy: Secondary | ICD-10-CM | POA: Diagnosis not present

## 2020-03-27 MED FILL — ENOXAPARIN SODIUM 80 MG/0.8: 80 | 12 days supply | Qty: 19 | Fill #0

## 2020-03-30 ENCOUNTER — Ambulatory Visit: Payer: 59

## 2020-03-30 ENCOUNTER — Ambulatory Visit
Admission: RE | Admit: 2020-03-30 | Discharge: 2020-03-30 | Disposition: A | Discharge status: Home / Self Care | Payer: 59 | Source: Ambulatory Visit | Attending: Radiation Oncology | Admitting: Radiation Oncology

## 2020-03-30 ENCOUNTER — Other Ambulatory Visit: Payer: Self-pay

## 2020-03-30 ENCOUNTER — Telehealth: Payer: Self-pay | Admitting: *Deleted

## 2020-03-30 DIAGNOSIS — Z510 Encounter for antineoplastic radiation therapy: Secondary | ICD-10-CM | POA: Diagnosis not present

## 2020-03-30 NOTE — Telephone Encounter (Signed)
Message received from patient requesting to be placed in the bed for his treatment on Weds, 04/01/20 to help prevent swelling to his legs.  Informed pt that note would be placed on his appt schedule to place him in the bed on Weds.  Pt appreciative of assistance and has no further questions at this time.

## 2020-03-31 ENCOUNTER — Other Ambulatory Visit: Payer: Self-pay

## 2020-03-31 ENCOUNTER — Ambulatory Visit
Admission: RE | Admit: 2020-03-31 | Discharge: 2020-03-31 | Disposition: A | Discharge status: Home / Self Care | Payer: 59 | Source: Ambulatory Visit | Attending: Radiation Oncology | Admitting: Radiation Oncology

## 2020-03-31 ENCOUNTER — Ambulatory Visit: Payer: 59

## 2020-03-31 ENCOUNTER — Other Ambulatory Visit: Payer: Self-pay | Admitting: *Deleted

## 2020-03-31 DIAGNOSIS — C679 Malignant neoplasm of bladder, unspecified: Secondary | ICD-10-CM

## 2020-03-31 DIAGNOSIS — R1900 Intra-abdominal and pelvic swelling, mass and lump, unspecified site: Secondary | ICD-10-CM

## 2020-03-31 DIAGNOSIS — Z510 Encounter for antineoplastic radiation therapy: Secondary | ICD-10-CM | POA: Diagnosis not present

## 2020-04-01 ENCOUNTER — Inpatient Hospital Stay: Payer: 59

## 2020-04-01 ENCOUNTER — Ambulatory Visit
Admission: RE | Admit: 2020-04-01 | Discharge: 2020-04-01 | Disposition: A | Discharge status: Home / Self Care | Payer: 59 | Source: Ambulatory Visit | Attending: Radiation Oncology | Admitting: Radiation Oncology

## 2020-04-01 ENCOUNTER — Other Ambulatory Visit: Payer: Self-pay

## 2020-04-01 ENCOUNTER — Ambulatory Visit: Payer: 59

## 2020-04-01 VITALS — BP 131/72 | HR 85 | Resp 16

## 2020-04-01 DIAGNOSIS — Z510 Encounter for antineoplastic radiation therapy: Secondary | ICD-10-CM | POA: Diagnosis not present

## 2020-04-01 DIAGNOSIS — R1900 Intra-abdominal and pelvic swelling, mass and lump, unspecified site: Secondary | ICD-10-CM

## 2020-04-01 DIAGNOSIS — Z5111 Encounter for antineoplastic chemotherapy: Secondary | ICD-10-CM | POA: Diagnosis not present

## 2020-04-01 DIAGNOSIS — C679 Malignant neoplasm of bladder, unspecified: Secondary | ICD-10-CM

## 2020-04-01 DIAGNOSIS — C799 Secondary malignant neoplasm of unspecified site: Secondary | ICD-10-CM

## 2020-04-01 LAB — CMP (CANCER CENTER ONLY)
ALT: 16 U/L (ref 0–44)
AST: 12 U/L — ABNORMAL LOW (ref 15–41)
Albumin: 3.7 g/dL (ref 3.5–5.0)
Alkaline Phosphatase: 82 U/L (ref 38–126)
Anion gap: 6 (ref 5–15)
BUN: 14 mg/dL (ref 8–23)
CO2: 27 mmol/L (ref 22–32)
Calcium: 9.5 mg/dL (ref 8.9–10.3)
Chloride: 102 mmol/L (ref 98–111)
Creatinine: 0.61 mg/dL (ref 0.61–1.24)
GFR, Estimated: >60 mL/min (ref >60)
Glucose, Bld: 102 mg/dL — ABNORMAL HIGH (ref 70–99)
Potassium: 4.2 mmol/L (ref 3.5–5.1)
Sodium: 135 mmol/L (ref 135–145)
Total Bilirubin: 0.3 mg/dL (ref 0.3–1.2)
Total Protein: 7 g/dL (ref 6.5–8.1)

## 2020-04-01 LAB — CBC WITH DIFFERENTIAL (CANCER CENTER ONLY)
Abs Immature Granulocytes: 0.04 10*3/uL (ref 0.00–0.07)
Basophils Absolute: 0 10*3/uL (ref 0.0–0.1)
Basophils Relative: 1 %
Eosinophils Absolute: 0.1 10*3/uL (ref 0.0–0.5)
Eosinophils Relative: 1 %
HCT: 29.2 % — ABNORMAL LOW (ref 39.0–52.0)
Hemoglobin: 9.7 g/dL — ABNORMAL LOW (ref 13.0–17.0)
Immature Granulocytes: 1 %
Lymphocytes Relative: 8 %
Lymphs Abs: 0.5 10*3/uL — ABNORMAL LOW (ref 0.7–4.0)
MCH: 32.2 pg (ref 26.0–34.0)
MCHC: 33.2 g/dL (ref 30.0–36.0)
MCV: 97 fL (ref 80.0–100.0)
Monocytes Absolute: 0.4 10*3/uL (ref 0.1–1.0)
Monocytes Relative: 6 %
Neutro Abs: 5.4 10*3/uL (ref 1.7–7.7)
Neutrophils Relative %: 83 %
Platelet Count: 209 10*3/uL (ref 150–400)
RBC: 3.01 MIL/uL — ABNORMAL LOW (ref 4.22–5.81)
RDW: 17.8 % — ABNORMAL HIGH (ref 11.5–15.5)
WBC Count: 6.5 10*3/uL (ref 4.0–10.5)
nRBC: 0 % (ref 0.0–0.2)

## 2020-04-01 MED ORDER — PALONOSETRON HCL INJECTION 0.25 MG/5ML
0.2500 mg | Freq: Once | INTRAVENOUS | Status: AC
  Administered 2020-04-01: 0.25 mg via INTRAVENOUS

## 2020-04-01 MED ORDER — SODIUM CHLORIDE 0.9 % IV SOLN
150.0000 mg | Freq: Once | INTRAVENOUS | Status: AC
  Administered 2020-04-01: 150 mg via INTRAVENOUS
  Filled 2020-04-01: qty 150

## 2020-04-01 MED ORDER — PALONOSETRON HCL INJECTION 0.25 MG/5ML
INTRAVENOUS | Status: AC
  Filled 2020-04-01: qty 5

## 2020-04-01 MED ORDER — SODIUM CHLORIDE 0.9 % IV SOLN
35.0000 mg/m2 | Freq: Once | INTRAVENOUS | Status: AC
  Administered 2020-04-01: 71 mg via INTRAVENOUS
  Filled 2020-04-01: qty 71

## 2020-04-01 MED ORDER — SODIUM CHLORIDE 0.9 % IV SOLN
Freq: Once | INTRAVENOUS | Status: AC
  Filled 2020-04-01: qty 10

## 2020-04-01 MED ORDER — SODIUM CHLORIDE 0.9 % IV SOLN
10.0000 mg | Freq: Once | INTRAVENOUS | Status: AC
  Administered 2020-04-01: 10 mg via INTRAVENOUS
  Filled 2020-04-01: qty 10

## 2020-04-01 MED ORDER — SODIUM CHLORIDE 0.9 % IV SOLN
Freq: Once | INTRAVENOUS | Status: AC
  Filled 2020-04-01: qty 250

## 2020-04-01 MED FILL — LORazepam 0.5 MG TABS: 0.5 | 7 days supply | Qty: 30 | Fill #0

## 2020-04-01 NOTE — Patient Instructions (Signed)
[facility] Discharge Instructions for Patients Receiving Chemotherapy  Today you received the following chemotherapy agents Cisplatin  To help prevent nausea and vomiting after your treatment, we encourage you to take your nausea medication    If you develop nausea and vomiting that is not controlled by your nausea medication, call the clinic.   BELOW ARE SYMPTOMS THAT SHOULD BE REPORTED IMMEDIATELY:  *FEVER GREATER THAN 100.5 F  *CHILLS WITH OR WITHOUT FEVER  NAUSEA AND VOMITING THAT IS NOT CONTROLLED WITH YOUR NAUSEA MEDICATION  *UNUSUAL SHORTNESS OF BREATH  *UNUSUAL BRUISING OR BLEEDING  TENDERNESS IN MOUTH AND THROAT WITH OR WITHOUT PRESENCE OF ULCERS  *URINARY PROBLEMS  *BOWEL PROBLEMS  UNUSUAL RASH Items with * indicate a potential emergency and should be followed up as soon as possible.  Feel free to call the clinic should you have any questions or concerns. The clinic phone number is [phone].  Please show the CHEMO ALERT CARD at check-in to the Emergency Department and triage nurse.   

## 2020-04-01 NOTE — Progress Notes (Signed)
Patient discharged via [place]. In good spirits.  Tolerated treatment well

## 2020-04-02 ENCOUNTER — Ambulatory Visit (INDEPENDENT_AMBULATORY_CARE_PROVIDER_SITE_OTHER): Payer: 59 | Admitting: Vascular Surgery

## 2020-04-02 ENCOUNTER — Ambulatory Visit (HOSPITAL_COMMUNITY)
Admission: RE | Admit: 2020-04-02 | Discharge: 2020-04-02 | Disposition: A | Discharge status: Home / Self Care | Payer: 59 | Source: Ambulatory Visit | Attending: Vascular Surgery | Admitting: Vascular Surgery

## 2020-04-02 ENCOUNTER — Ambulatory Visit (INDEPENDENT_AMBULATORY_CARE_PROVIDER_SITE_OTHER)
Admit: 2020-04-02 | Discharge: 2020-04-02 | Disposition: A | Discharge status: Home / Self Care | Payer: 59 | Attending: Vascular Surgery | Admitting: Vascular Surgery

## 2020-04-02 ENCOUNTER — Ambulatory Visit
Admission: RE | Admit: 2020-04-02 | Discharge: 2020-04-02 | Disposition: A | Discharge status: Home / Self Care | Payer: 59 | Source: Ambulatory Visit | Attending: Radiation Oncology | Admitting: Radiation Oncology

## 2020-04-02 ENCOUNTER — Ambulatory Visit: Payer: 59

## 2020-04-02 ENCOUNTER — Other Ambulatory Visit: Payer: Self-pay

## 2020-04-02 VITALS — BP 117/69 | HR 63 | Temp 98.2°F | Resp 20 | Ht 72.0 in | Wt 170.0 lb

## 2020-04-02 DIAGNOSIS — I82422 Acute embolism and thrombosis of left iliac vein: Secondary | ICD-10-CM

## 2020-04-02 DIAGNOSIS — M7989 Other specified soft tissue disorders: Secondary | ICD-10-CM | POA: Diagnosis present

## 2020-04-02 DIAGNOSIS — Z510 Encounter for antineoplastic radiation therapy: Secondary | ICD-10-CM | POA: Diagnosis not present

## 2020-04-02 NOTE — Progress Notes (Signed)
Patient is a 64 year old male who underwent an artery mechanical thrombectomy of the left common external superficial femoral and popliteal veins as well as left common and external iliac artery stenting.  This was on March 13, 2020.  He has known recurrent bladder cancer which is thought to be the etiology of his iliac vein thrombosis and narrowing.  He is currently undergoing chemotherapy.  Physical exam:  Vitals:   04/02/20 0841  BP: 117/69  Pulse: 63  Resp: 20  Temp: 98.2 F (36.8 C)  SpO2: 98%  Weight: 170 lb (77.1 kg)  Height: 6' (1.829 m)   Remedies: Left leg is edematous slightly dusky but warm all the way to the level of the ankle.  Left foot is slightly cool.  There is some areas of blistering at the base of the toes on the left foot that is now dry and healing.   Data: Patient had a duplex ultrasound today which showed the left iliac venous stent is occluded.  There is also thrombus within the inferior vena cava and common iliac and external iliac vein.  Assessment: Acute occlusion short-term left common iliac external iliac stent in the face of malignancy.  Patient has been compliant with his Lovenox daily.  Plan: I do not believe that it would be worthwhile to try to recanalize his stent in light of the fact that this occluded on therapeutic Lovenox in less than 2 weeks.  Discussed with the patient today he would benefit from a compression stocking on the left leg.  We have given him a 20-30 knee-high and thigh-high stocking today.  He will continue his Lovenox for life.  He will follow up with [place] on an as-needed basis if he has any breakdown of skin or wounds on his left leg.  Ruta Hinds, MD [facility] Office: [phone]

## 2020-04-03 ENCOUNTER — Ambulatory Visit
Admission: RE | Admit: 2020-04-03 | Discharge: 2020-04-03 | Disposition: A | Discharge status: Home / Self Care | Payer: 59 | Source: Ambulatory Visit | Attending: Radiation Oncology | Admitting: Radiation Oncology

## 2020-04-03 ENCOUNTER — Other Ambulatory Visit: Payer: Self-pay | Admitting: Family

## 2020-04-03 ENCOUNTER — Other Ambulatory Visit: Payer: Self-pay | Admitting: *Deleted

## 2020-04-03 ENCOUNTER — Ambulatory Visit: Payer: 59

## 2020-04-03 ENCOUNTER — Encounter: Payer: Self-pay | Admitting: *Deleted

## 2020-04-03 DIAGNOSIS — Z510 Encounter for antineoplastic radiation therapy: Secondary | ICD-10-CM | POA: Diagnosis not present

## 2020-04-03 DIAGNOSIS — C679 Malignant neoplasm of bladder, unspecified: Secondary | ICD-10-CM

## 2020-04-03 MED ORDER — PANTOPRAZOLE SODIUM 40 MG PO TBEC: 40.0000 mg | DELAYED_RELEASE_TABLET | Freq: Every day | ORAL | 3 refills | Status: DC

## 2020-04-03 MED FILL — MELOXICAM 15 MG TABLET: 15 | 30 days supply | Qty: 30 | Fill #0

## 2020-04-03 MED FILL — PANTOPRAZOLE SOD DR 40 MG T: 40 | 30 days supply | Qty: 30 | Fill #0

## 2020-04-03 NOTE — Progress Notes (Signed)
Patient calling with several updates.  He saw his vascular surgeon yesterday. He was told his stent failed and there were no other options. He wonders if a second opinion would be warranted. Shared with him, that a second opinion was never a bad thing and that if he decided to move forward, to share this when we see him next week and we can help with the referral.  He has developed acid reflux after his most recent chemo treatment. He has been using Maalox to treat, but would like something in prescription form. Spoke to Judson Roch and prescription for protonix sent. Patient aware of prescription and pharmacy confirmed.   He confirmed which prescription have been sent in my Dr Marin Olp. He also states he is keeping his legs elevated, and wearing compression stockings prn.   Oncology Nurse Navigator Documentation  Oncology Nurse Navigator Flowsheets 04/03/2020  Abnormal Finding Date -  Confirmed Diagnosis Date -  Diagnosis Status -  Planned Course of Treatment -  Phase of Treatment -  Chemo/Radiation Concurrent Actual Start Date: -  Navigator Follow Up Date: 04/08/2020  Navigator Follow Up Reason: Follow-up Appointment;Chemotherapy  Navigator Location [facility]  Referral Date to RadOnc/MedOnc -  Navigator Encounter Type Telephone  Telephone Incoming Call;Patient Update  Treatment Initiated Date -  Patient Visit Type MedOnc  Treatment Phase Active Tx  Barriers/Navigation Needs Coordination of Care;Education  Education Other  Interventions Education;Psycho-Social Support;Medication Assistance  Acuity Level 2-Minimal Needs (1-2 Barriers Identified)  Referrals -  Coordination of Care -  Education Method Verbal  Support Groups/Services Friends and Family  Time Spent with Patient 30

## 2020-04-06 ENCOUNTER — Ambulatory Visit: Payer: 59

## 2020-04-06 ENCOUNTER — Ambulatory Visit
Admission: RE | Admit: 2020-04-06 | Discharge: 2020-04-06 | Disposition: A | Discharge status: Home / Self Care | Payer: 59 | Source: Ambulatory Visit | Attending: Radiation Oncology | Admitting: Radiation Oncology

## 2020-04-06 ENCOUNTER — Other Ambulatory Visit: Payer: Self-pay

## 2020-04-06 DIAGNOSIS — Z510 Encounter for antineoplastic radiation therapy: Secondary | ICD-10-CM | POA: Diagnosis not present

## 2020-04-07 ENCOUNTER — Other Ambulatory Visit: Payer: Self-pay | Admitting: Hematology & Oncology

## 2020-04-07 ENCOUNTER — Ambulatory Visit
Admission: RE | Admit: 2020-04-07 | Discharge: 2020-04-07 | Disposition: A | Discharge status: Home / Self Care | Payer: 59 | Source: Ambulatory Visit | Attending: Radiation Oncology | Admitting: Radiation Oncology

## 2020-04-07 ENCOUNTER — Ambulatory Visit: Payer: 59

## 2020-04-07 DIAGNOSIS — Z510 Encounter for antineoplastic radiation therapy: Secondary | ICD-10-CM | POA: Diagnosis not present

## 2020-04-07 MED FILL — ENOXAPARIN SODIUM 80 MG/0.8: 80 | 12 days supply | Qty: 19 | Fill #1

## 2020-04-08 ENCOUNTER — Encounter: Payer: Self-pay | Admitting: Family

## 2020-04-08 ENCOUNTER — Inpatient Hospital Stay: Payer: 59

## 2020-04-08 ENCOUNTER — Encounter: Payer: Self-pay | Admitting: *Deleted

## 2020-04-08 ENCOUNTER — Ambulatory Visit: Payer: 59

## 2020-04-08 ENCOUNTER — Inpatient Hospital Stay (HOSPITAL_BASED_OUTPATIENT_CLINIC_OR_DEPARTMENT_OTHER): Payer: 59 | Admitting: Family

## 2020-04-08 ENCOUNTER — Other Ambulatory Visit: Payer: Self-pay

## 2020-04-08 ENCOUNTER — Ambulatory Visit
Admission: RE | Admit: 2020-04-08 | Discharge: 2020-04-08 | Disposition: A | Discharge status: Home / Self Care | Payer: 59 | Source: Ambulatory Visit | Attending: Radiation Oncology | Admitting: Radiation Oncology

## 2020-04-08 ENCOUNTER — Other Ambulatory Visit: Payer: Self-pay | Admitting: *Deleted

## 2020-04-08 ENCOUNTER — Telehealth: Payer: Self-pay | Admitting: Hematology & Oncology

## 2020-04-08 VITALS — BP 122/74 | HR 80 | Temp 98.4°F | Resp 17 | Wt 176.0 lb

## 2020-04-08 VITALS — BP 122/74 | HR 80 | Temp 98.4°F | Resp 17 | Ht 72.0 in | Wt 176.0 lb

## 2020-04-08 DIAGNOSIS — C799 Secondary malignant neoplasm of unspecified site: Secondary | ICD-10-CM | POA: Diagnosis not present

## 2020-04-08 DIAGNOSIS — C679 Malignant neoplasm of bladder, unspecified: Secondary | ICD-10-CM

## 2020-04-08 DIAGNOSIS — R1900 Intra-abdominal and pelvic swelling, mass and lump, unspecified site: Secondary | ICD-10-CM

## 2020-04-08 DIAGNOSIS — I82422 Acute embolism and thrombosis of left iliac vein: Secondary | ICD-10-CM

## 2020-04-08 DIAGNOSIS — Z510 Encounter for antineoplastic radiation therapy: Secondary | ICD-10-CM | POA: Diagnosis not present

## 2020-04-08 DIAGNOSIS — I871 Compression of vein: Secondary | ICD-10-CM

## 2020-04-08 DIAGNOSIS — Z5111 Encounter for antineoplastic chemotherapy: Secondary | ICD-10-CM | POA: Diagnosis not present

## 2020-04-08 LAB — COMPREHENSIVE METABOLIC PANEL
ALT: 14 U/L (ref 0–44)
AST: 11 U/L — ABNORMAL LOW (ref 15–41)
Albumin: 3.3 g/dL — ABNORMAL LOW (ref 3.5–5.0)
Alkaline Phosphatase: 77 U/L (ref 38–126)
Anion gap: 5 (ref 5–15)
BUN: 12 mg/dL (ref 8–23)
CO2: 27 mmol/L (ref 22–32)
Calcium: 9.3 mg/dL (ref 8.9–10.3)
Chloride: 103 mmol/L (ref 98–111)
Creatinine, Ser: 0.66 mg/dL (ref 0.61–1.24)
GFR, Estimated: >60 mL/min (ref >60)
Glucose, Bld: 96 mg/dL (ref 70–99)
Potassium: 4.4 mmol/L (ref 3.5–5.1)
Sodium: 135 mmol/L (ref 135–145)
Total Bilirubin: 0.4 mg/dL (ref 0.3–1.2)
Total Protein: 6.8 g/dL (ref 6.5–8.1)

## 2020-04-08 LAB — CBC WITH DIFFERENTIAL (CANCER CENTER ONLY)
Abs Immature Granulocytes: 0.02 10*3/uL (ref 0.00–0.07)
Basophils Absolute: 0 10*3/uL (ref 0.0–0.1)
Basophils Relative: 1 %
Eosinophils Absolute: 0.1 10*3/uL (ref 0.0–0.5)
Eosinophils Relative: 2 %
HCT: 30.2 % — ABNORMAL LOW (ref 39.0–52.0)
Hemoglobin: 10 g/dL — ABNORMAL LOW (ref 13.0–17.0)
Immature Granulocytes: 0 %
Lymphocytes Relative: 9 %
Lymphs Abs: 0.4 10*3/uL — ABNORMAL LOW (ref 0.7–4.0)
MCH: 31.5 pg (ref 26.0–34.0)
MCHC: 33.1 g/dL (ref 30.0–36.0)
MCV: 95.3 fL (ref 80.0–100.0)
Monocytes Absolute: 0.3 10*3/uL (ref 0.1–1.0)
Monocytes Relative: 6 %
Neutro Abs: 4.3 10*3/uL (ref 1.7–7.7)
Neutrophils Relative %: 82 %
Platelet Count: 177 10*3/uL (ref 150–400)
RBC: 3.17 MIL/uL — ABNORMAL LOW (ref 4.22–5.81)
RDW: 18.9 % — ABNORMAL HIGH (ref 11.5–15.5)
WBC Count: 5.2 10*3/uL (ref 4.0–10.5)
nRBC: 0 % (ref 0.0–0.2)

## 2020-04-08 LAB — MAGNESIUM: Magnesium: 1.6 mg/dL — ABNORMAL LOW (ref 1.7–2.4)

## 2020-04-08 MED ORDER — PALONOSETRON HCL INJECTION 0.25 MG/5ML
0.2500 mg | Freq: Once | INTRAVENOUS | Status: AC
  Administered 2020-04-08: 0.25 mg via INTRAVENOUS

## 2020-04-08 MED ORDER — SODIUM CHLORIDE 0.9 % IV SOLN
35.0000 mg/m2 | Freq: Once | INTRAVENOUS | Status: AC
  Administered 2020-04-08: 71 mg via INTRAVENOUS
  Filled 2020-04-08: qty 71

## 2020-04-08 MED ORDER — SODIUM CHLORIDE 0.9 % IV SOLN
Freq: Once | INTRAVENOUS | Status: AC
  Filled 2020-04-08: qty 250

## 2020-04-08 MED ORDER — SODIUM CHLORIDE 0.9 % IV SOLN
10.0000 mg | Freq: Once | INTRAVENOUS | Status: AC
  Administered 2020-04-08: 10 mg via INTRAVENOUS
  Filled 2020-04-08: qty 10

## 2020-04-08 MED ORDER — SODIUM CHLORIDE 0.9 % IV SOLN
150.0000 mg | Freq: Once | INTRAVENOUS | Status: AC
  Administered 2020-04-08: 150 mg via INTRAVENOUS
  Filled 2020-04-08: qty 150

## 2020-04-08 MED ORDER — PALONOSETRON HCL INJECTION 0.25 MG/5ML
INTRAVENOUS | Status: AC
  Filled 2020-04-08: qty 5

## 2020-04-08 MED ORDER — SODIUM CHLORIDE 0.9 % IV SOLN
Freq: Once | INTRAVENOUS | Status: AC
  Filled 2020-04-08: qty 10

## 2020-04-08 MED ORDER — FENTANYL 25 MCG/HR TD PT72: 1.0000 | MEDICATED_PATCH | TRANSDERMAL | 0 refills | Status: DC

## 2020-04-08 MED FILL — ZOLPIDEM TARTRATE 10 MG TAB: 10 | 30 days supply | Qty: 30 | Fill #0

## 2020-04-08 MED FILL — FENTANYL 25 MCG/HR PT72: 25 | 30 days supply | Qty: 10 | Fill #0

## 2020-04-08 NOTE — Patient Instructions (Signed)

## 2020-04-08 NOTE — Progress Notes (Signed)
Hematology and Oncology Follow Up Visit  Brian Benton [id number] [birth year] 64 y.o. 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 He is doing well with weekly cisplatin and radiation 5 days a week. He states that his last radiation treatment with be next week on Monday November 1st.  He has occasional fatigue and rests when needed.  Unfortunately, his iliac stent is now occluded and Dr. Oneida Benton did not feel it was worth while to recanalize the stent since it occluded while he was on therapeutic Lovenox. He would like a second opinion so we will have interventional radiology take a look and see if they have any suggestions.  He states that wearing the compression stocking in the left leg regularly seems to have reduced the swelling a bit. He still has mild pitting in the left lower extremity. Left pedal pulse is 2+ and right pedal pulse 3+.   He would like to finish his 50 mcg duragesic patches (Sunday) and then try decreasing to a 25 mcg patch to see if his pain has improved. No fever, chills, n/v, cough, rash, dizziness, SOB, chest pain, palpitations, abdominal pain or changes in bowel or bladder habits.  He notes that his GERD is much better on Protonix.  He takes a stool softener and Miralax to prevent constipation.  No episodes of bleeding. No abnormal bruising or petechiae.  He is ambulating with a rollator walker and has not had any falls or syncopal episodes.  He states that he is eating well and staying properly hydrated. His weight is stable.   ECOG Performance Status: 2 - Symptomatic, <50% confined to bed  Medications:  Allergies as of 04/08/2020    No Known Allergies     Medication List       Accurate as of April 08, 2020  9:53 AM. If you have any questions, ask your nurse or doctor.        Aspirin Adult Low Strength 81 MG EC tablet Generic drug: aspirin TAKE 1 TABLET BY MOUTH EVERY DAY What changed: how much to take   dexamethasone 4 MG tablet Commonly known as: DECADRON Take two tablets (8 mg total) by mouth daily.  Take daily times three days starting the day after Cisplatin chemotherapy.  Take with food.   enoxaparin 80 MG/0.8ML injection Commonly known as: LOVENOX Inject 0.8 mLs (80 mg total) into the skin every 12 (twelve) hours.   fentaNYL 50 MCG/HR Commonly known as: DURAGESIC PLACE 1 PATCH ONTO THE SKIN EVERY 3 DAYS   gabapentin 600 MG tablet Commonly known as: NEURONTIN Take 1 tablet (600 mg total) by mouth in the morning, at noon, in the evening, and at bedtime.   LORazepam 0.5 MG tablet Commonly known as: ATIVAN Take 1 tablet (0.5 mg total) by mouth every 6 (six) hours as needed for anxiety.   meloxicam 15 MG tablet Commonly known as: MOBIC Take 1 tablet (15 mg total) by mouth daily.   ondansetron 8 MG tablet Commonly known as: ZOFRAN Take 1 tablet (8 mg total) by mouth 2 (two) times daily as needed for nausea or vomiting. Start on third day after Cisplatin chemotherapy  Oxycodone HCl 10 MG Tabs Take 1 tablet (10 mg total) by mouth every 4 (four) hours as needed.   pantoprazole 40 MG tablet Commonly known as: Protonix Take 1 tablet (40 mg total) by mouth daily.   prochlorperazine 10 MG tablet Commonly known as: COMPAZINE Take 1 tablet (10 mg total) by mouth every 6 (six) hours as needed for nausea or vomiting.   zolpidem 10 MG tablet Commonly known as: AMBIEN TAKE 1 TABLET (10 MG TOTAL) BY MOUTH AT BEDTIME AS NEEDED FOR SLEEP.       Allergies: No Known Allergies  Past Medical History, Surgical history, Social history, and Family History were reviewed and updated.  Review of  Systems: All other 10 point review of systems is negative.   Physical Exam:  height is 6' (1.829 m) and weight is 176 lb (79.8 kg). His oral temperature is 98.4 F (36.9 C). His blood pressure is 122/74 and his pulse is 80. His respiration is 17 and oxygen saturation is 100%.   Wt Readings from Last 3 Encounters:  04/08/20 176 lb (79.8 kg)  04/02/20 170 lb (77.1 kg)  03/18/20 170 lb (77.1 kg)    Ocular: Sclerae unicteric, pupils equal, round and reactive to light Ear-nose-throat: Oropharynx clear, dentition fair Lymphatic: No cervical, supraclavicular or axillary adenopathy Lungs no rales or rhonchi, good excursion bilaterally Heart regular rate and rhythm, no murmur appreciated Abd soft, nontender, positive bowel sounds, no liver or spleen tip palpated on exam, no fluid wave  MSK no focal spinal tenderness, no joint edema Neuro: non-focal, well-oriented, appropriate affect Breasts: Deferred   Lab Results  Component Value Date   WBC 5.2 04/08/2020   HGB 10.0 (L) 04/08/2020   HCT 30.2 (L) 04/08/2020   MCV 95.3 04/08/2020   PLT 177 04/08/2020   No results found for: FERRITIN, IRON, TIBC, UIBC, IRONPCTSAT Lab Results  Component Value Date   RBC 3.17 (L) 04/08/2020   Lab Results  Component Value Date   KPAFRELGTCHN 34.8 (H) 02/25/2020   LAMBDASER 20.4 02/25/2020   KAPLAMBRATIO 1.71 (H) 02/25/2020   Lab Results  Component Value Date   IGGSERUM 1,193 02/25/2020   IGA 264 02/25/2020   IGMSERUM 89 02/25/2020   Lab Results  Component Value Date   TOTALPROTELP 7.0 02/25/2020   ALBUMINELP 3.2 02/25/2020   A1GS 0.4 02/25/2020   A2GS 1.1 (H) 02/25/2020   BETS 1.1 02/25/2020   GAMS 1.3 02/25/2020   MSPIKE Not Observed 02/25/2020     Chemistry      Component Value Date/Time   NA 135 04/08/2020 0846   K 4.4 04/08/2020 0846   CL 103 04/08/2020 0846   CO2 27 04/08/2020 0846   BUN 12 04/08/2020 0846   CREATININE 0.66 04/08/2020 0846   CREATININE 0.61 04/01/2020 0937       Component Value Date/Time   CALCIUM 9.3 04/08/2020 0846   ALKPHOS 77 04/08/2020 0846   AST 11 (L) 04/08/2020 0846   AST 12 (L) 04/01/2020 0937   ALT 14 04/08/2020 0846   ALT 16 04/01/2020 0937   BILITOT 0.4 04/08/2020 0846   BILITOT 0.3 04/01/2020 0937       Impression and Plan: Mr. Lundin is a very pleasant 64 yo caucasian gentleman with history of bladder cancer in situ about 5 years ago. He now has recurrence outside the bladder that is considered metastatic. This is inoperable and invading the psoas muscle as well as his lower spine.  He will complete radiation on Monday  and Today is his last Cisplatin.  We will then give him a 6 weeks break before repeating an MRI of the lumbar spine.  I have referred him to interventional radiology for further eval of the occluded iliac stent.  He will continue his same regimen with Lovenox.  Script for Duragesic patch reduced to 25 mcg per his request.  MD follow-up in 6 weeks once he has had his MRI to discuss results and treatment plan moving forward.  He was encouraged to contact our office with any questions or concerns.   Laverna Peace, NP 10/27/20219:53 AM

## 2020-04-08 NOTE — Telephone Encounter (Signed)
Appointments scheduled calendar printed per 10/27 los

## 2020-04-08 NOTE — Progress Notes (Signed)
Oncology Nurse Navigator Documentation  Oncology Nurse Navigator Flowsheets 04/08/2020  Abnormal Finding Date -  Confirmed Diagnosis Date -  Diagnosis Status -  Planned Course of Treatment -  Phase of Treatment -  Chemo/Radiation Concurrent Actual Start Date: -  Navigator Follow Up Date: 05/20/2020  Navigator Follow Up Reason: Follow-up Appointment  Navigator Location [facility]  Referral Date to RadOnc/MedOnc -  Navigator Encounter Type Treatment  Telephone -  Treatment Initiated Date -  Patient Visit Type MedOnc  Treatment Phase Active Tx  Barriers/Navigation Needs Coordination of Care;Education  Education -  Interventions Psycho-Social Support  Acuity Level 2-Minimal Needs (1-2 Barriers Identified)  Referrals -  Coordination of Care -  Education Method -  Support Groups/Services Friends and Family  Time Spent with Patient 15

## 2020-04-09 ENCOUNTER — Other Ambulatory Visit: Payer: Self-pay | Admitting: *Deleted

## 2020-04-09 ENCOUNTER — Ambulatory Visit: Payer: 59

## 2020-04-09 ENCOUNTER — Ambulatory Visit
Admission: RE | Admit: 2020-04-09 | Discharge: 2020-04-09 | Disposition: A | Discharge status: Home / Self Care | Payer: 59 | Source: Ambulatory Visit | Attending: Radiation Oncology | Admitting: Radiation Oncology

## 2020-04-09 DIAGNOSIS — I82422 Acute embolism and thrombosis of left iliac vein: Secondary | ICD-10-CM

## 2020-04-09 DIAGNOSIS — Z510 Encounter for antineoplastic radiation therapy: Secondary | ICD-10-CM | POA: Diagnosis not present

## 2020-04-09 MED FILL — DEXAMETHASONE 4 MG TABLET: 4 | 15 days supply | Qty: 30 | Fill #1

## 2020-04-10 ENCOUNTER — Ambulatory Visit: Payer: 59

## 2020-04-10 ENCOUNTER — Encounter: Payer: Self-pay | Admitting: *Deleted

## 2020-04-10 ENCOUNTER — Ambulatory Visit
Admission: RE | Admit: 2020-04-10 | Discharge: 2020-04-10 | Disposition: A | Discharge status: Home / Self Care | Payer: 59 | Source: Ambulatory Visit | Attending: Radiation Oncology | Admitting: Radiation Oncology

## 2020-04-10 ENCOUNTER — Other Ambulatory Visit: Payer: Self-pay

## 2020-04-10 ENCOUNTER — Other Ambulatory Visit: Payer: Self-pay | Admitting: Family

## 2020-04-10 DIAGNOSIS — C679 Malignant neoplasm of bladder, unspecified: Secondary | ICD-10-CM

## 2020-04-10 DIAGNOSIS — I871 Compression of vein: Secondary | ICD-10-CM

## 2020-04-10 DIAGNOSIS — R1900 Intra-abdominal and pelvic swelling, mass and lump, unspecified site: Secondary | ICD-10-CM

## 2020-04-10 DIAGNOSIS — Z510 Encounter for antineoplastic radiation therapy: Secondary | ICD-10-CM | POA: Diagnosis not present

## 2020-04-10 DIAGNOSIS — I82422 Acute embolism and thrombosis of left iliac vein: Secondary | ICD-10-CM

## 2020-04-10 NOTE — Progress Notes (Signed)
Patient requests a urgent referral be placed to Dr Scot Dock for a second opinion related to his DVT. Referral placed. Patient also asks which physician Judson Roch has referred him to regarding his DVT. Spoke with Judson Roch and she placed a referral to IR. Notified patient.  Patient needs an MRI scheduled prior to his next MD appointment. MRI scheduled for 05/15/20. Reviewed this appointment, time, date and location with patient. Also mailed radiology info sheet to patient with same information.   Oncology Nurse Navigator Documentation  Oncology Nurse Navigator Flowsheets 04/10/2020  Abnormal Finding Date -  Confirmed Diagnosis Date -  Diagnosis Status -  Planned Course of Treatment -  Phase of Treatment -  Chemo/Radiation Concurrent Actual Start Date: -  Navigator Follow Up Date: 05/20/2020  Navigator Follow Up Reason: Follow-up Appointment  Navigator Location [facility]  Referral Date to RadOnc/MedOnc -  Navigator Encounter Type Telephone  Telephone Incoming Call  Treatment Initiated Date -  Patient Visit Type MedOnc  Treatment Phase Active Tx  Barriers/Navigation Needs Coordination of Care;Education  Education Other  Interventions Coordination of Care;Education;Psycho-Social Support;Referrals  Acuity Level 2-Minimal Needs (1-2 Barriers Identified)  Referrals Other  Coordination of Care Appts  Education Method Verbal  Support Groups/Services Friends and Family  Time Spent with Patient -

## 2020-04-13 ENCOUNTER — Ambulatory Visit
Admission: RE | Admit: 2020-04-13 | Discharge: 2020-04-13 | Disposition: A | Discharge status: Home / Self Care | Payer: 59 | Source: Ambulatory Visit | Attending: Radiation Oncology | Admitting: Radiation Oncology

## 2020-04-13 ENCOUNTER — Ambulatory Visit: Payer: 59

## 2020-04-13 ENCOUNTER — Other Ambulatory Visit: Payer: Self-pay | Admitting: *Deleted

## 2020-04-13 ENCOUNTER — Other Ambulatory Visit: Payer: Self-pay | Admitting: Hematology & Oncology

## 2020-04-13 DIAGNOSIS — C7989 Secondary malignant neoplasm of other specified sites: Secondary | ICD-10-CM | POA: Insufficient documentation

## 2020-04-13 DIAGNOSIS — C679 Malignant neoplasm of bladder, unspecified: Secondary | ICD-10-CM | POA: Diagnosis present

## 2020-04-13 DIAGNOSIS — Z510 Encounter for antineoplastic radiation therapy: Secondary | ICD-10-CM | POA: Insufficient documentation

## 2020-04-13 MED ORDER — GABAPENTIN 600 MG PO TABS
600.0000 mg | ORAL_TABLET | Freq: Four times a day (QID) | ORAL | 3 refills | Status: DC
Start: 2020-04-13 — End: 2020-04-13

## 2020-04-13 MED FILL — GABAPENTIN 600 MG TABLET: 600 | 30 days supply | Qty: 120 | Fill #0

## 2020-04-14 ENCOUNTER — Ambulatory Visit: Payer: 59

## 2020-04-15 ENCOUNTER — Ambulatory Visit: Payer: 59

## 2020-04-16 ENCOUNTER — Ambulatory Visit: Payer: 59

## 2020-04-16 MED FILL — LORazepam 0.5 MG TABS: 0.5 | 7 days supply | Qty: 30 | Fill #1

## 2020-04-17 ENCOUNTER — Ambulatory Visit: Payer: 59

## 2020-04-20 ENCOUNTER — Ambulatory Visit: Payer: 59

## 2020-04-21 ENCOUNTER — Other Ambulatory Visit: Payer: Self-pay | Admitting: Family

## 2020-04-21 ENCOUNTER — Other Ambulatory Visit: Payer: Self-pay | Admitting: Hematology & Oncology

## 2020-04-21 ENCOUNTER — Ambulatory Visit: Payer: 59

## 2020-04-21 ENCOUNTER — Telehealth: Payer: Self-pay | Admitting: *Deleted

## 2020-04-21 DIAGNOSIS — C679 Malignant neoplasm of bladder, unspecified: Secondary | ICD-10-CM

## 2020-04-21 DIAGNOSIS — I82422 Acute embolism and thrombosis of left iliac vein: Secondary | ICD-10-CM

## 2020-04-21 DIAGNOSIS — I871 Compression of vein: Secondary | ICD-10-CM

## 2020-04-21 DIAGNOSIS — C799 Secondary malignant neoplasm of unspecified site: Secondary | ICD-10-CM

## 2020-04-21 DIAGNOSIS — I82402 Acute embolism and thrombosis of unspecified deep veins of left lower extremity: Secondary | ICD-10-CM

## 2020-04-21 NOTE — Telephone Encounter (Signed)
Patient called and stated,"I want you to know that I have an appointment next Thursday, with Dr. Leigh Aurora office."

## 2020-04-21 NOTE — Telephone Encounter (Signed)
Wife called and stated,"since he is doing PT he is really tired and exhausted. Can he have a muscle relaxer to help him rest at night?" They use [facility] pharmacy. I instructed her that Dr. Marin Olp is out of the office today and I will give him this message tomorrow. She verbalized understanding.

## 2020-04-22 ENCOUNTER — Ambulatory Visit: Payer: 59

## 2020-04-22 ENCOUNTER — Other Ambulatory Visit: Payer: Self-pay | Admitting: Hematology & Oncology

## 2020-04-22 ENCOUNTER — Other Ambulatory Visit: Payer: Self-pay | Admitting: *Deleted

## 2020-04-22 MED ORDER — ORPHENADRINE CITRATE ER 100 MG PO TB12: 100.0000 mg | ORAL_TABLET | Freq: Two times a day (BID) | ORAL | 0 refills | Status: DC

## 2020-04-22 MED FILL — ENOXAPARIN SODIUM 80 MG/0.8: 80 | 12 days supply | Qty: 19 | Fill #0

## 2020-04-22 MED FILL — ORPHENADRINE ER 100 MG TABL: 100 | 30 days supply | Qty: 60 | Fill #0

## 2020-04-28 ENCOUNTER — Other Ambulatory Visit: Payer: Self-pay | Admitting: Hematology & Oncology

## 2020-04-28 DIAGNOSIS — R222 Localized swelling, mass and lump, trunk: Secondary | ICD-10-CM

## 2020-04-28 DIAGNOSIS — C799 Secondary malignant neoplasm of unspecified site: Secondary | ICD-10-CM

## 2020-04-28 MED FILL — MELOXICAM 15 MG TABLET: 15 | 30 days supply | Qty: 30 | Fill #1

## 2020-04-29 ENCOUNTER — Encounter (INDEPENDENT_AMBULATORY_CARE_PROVIDER_SITE_OTHER): Payer: 59 | Admitting: Ophthalmology

## 2020-04-29 DIAGNOSIS — H53412 Scotoma involving central area, left eye: Secondary | ICD-10-CM

## 2020-04-29 DIAGNOSIS — H34232 Retinal artery branch occlusion, left eye: Secondary | ICD-10-CM

## 2020-04-29 DIAGNOSIS — H3581 Retinal edema: Secondary | ICD-10-CM

## 2020-04-29 DIAGNOSIS — H25813 Combined forms of age-related cataract, bilateral: Secondary | ICD-10-CM

## 2020-04-29 DIAGNOSIS — I10 Essential (primary) hypertension: Secondary | ICD-10-CM

## 2020-04-29 DIAGNOSIS — H35033 Hypertensive retinopathy, bilateral: Secondary | ICD-10-CM

## 2020-04-29 MED FILL — LORazepam 0.5 MG TABS: 0.5 | 7 days supply | Qty: 30 | Fill #0

## 2020-04-30 ENCOUNTER — Encounter: Payer: Self-pay | Admitting: *Deleted

## 2020-04-30 ENCOUNTER — Other Ambulatory Visit: Payer: Self-pay | Admitting: Interventional Radiology

## 2020-04-30 ENCOUNTER — Ambulatory Visit
Admission: RE | Admit: 2020-04-30 | Discharge: 2020-04-30 | Disposition: A | Discharge status: Home / Self Care | Payer: 59 | Source: Ambulatory Visit | Attending: Family | Admitting: Family

## 2020-04-30 ENCOUNTER — Other Ambulatory Visit: Payer: Self-pay | Admitting: Hematology & Oncology

## 2020-04-30 DIAGNOSIS — I871 Compression of vein: Secondary | ICD-10-CM

## 2020-04-30 DIAGNOSIS — C799 Secondary malignant neoplasm of unspecified site: Secondary | ICD-10-CM

## 2020-04-30 DIAGNOSIS — C679 Malignant neoplasm of bladder, unspecified: Secondary | ICD-10-CM

## 2020-04-30 DIAGNOSIS — I87002 Postthrombotic syndrome without complications of left lower extremity: Secondary | ICD-10-CM

## 2020-04-30 DIAGNOSIS — I82422 Acute embolism and thrombosis of left iliac vein: Secondary | ICD-10-CM

## 2020-04-30 HISTORY — PX: IR RADIOLOGIST EVAL & MGMT: IMG5224

## 2020-04-30 NOTE — Consult Note (Signed)
Chief Complaint: Leg swelling  Referring Physician(s): [place],Sarah M  History of Present Illness: Brian Benton is a 64 y.o. male presenting today to [place] clinic, kindly referred by Laverna Peace, NP, for evaluation of left leg swelling.   Brian Benton is here today with his wife for the interview.  They tell me that he was admitted for acute left leg swelling at the end of September.  They tell me that the left leg was "swollen" "painful" and "purple" and they came to the hospital.  Diagnosis of left DVT was made on duplex, involving tibial veins, popliteal vein, femoral vein, CFV.  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   He underwent angiogram, thrombectomy, and stenting 10/1.  He tells me that he had moderate if any relief.  He had a duplex 10/21 which reports DVT of the CFV, femoral vein, popliteal vein, tibial veins.   Currently he reports symptoms of ongoing asymmetry, pain, paresthesia/nerve pain, swelling, leg heaviness, redness/purple color, pitting edema.  He also describes a prior wound on the dorsal forefoot, that has now healed.   He continues to wear compression stockings daily, removing them at night.   His activity level is important to him, as he was previously exercising every day with aerobic fitness and weights.  He has not yet been able to return to his previous level of exercise comfortably, only now biking.   He takes occasional oxycodone for the pain, as well as gabapentin for the nerve type pain. He has also purchased a compression device for external compression at home, and his wife also helps him with some massage.    He continues to take lovenox and aspirin daily   Past Medical History:  Diagnosis Date  . Cancer ([place])    bladder  . History of bladder cancer    s/p  TURBT 07-25-2014  . Hyperlipidemia   . Hypertension   .  Mass of left hand   . 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 BLADDER SURGERY     [place]  . COLONOSCOPY    . CYSTECTOMY    . CYSTOSCOPY WITH BIOPSY N/A 07/25/2014   Procedure: CYSTOSCOPY WITH BLADDER BIOPSY AND FULGERATION;  Surgeon: [name], MD;  Location: [facility];  Service: Urology;  Laterality: N/A;  . CYSTOSCOPY WITH BIOPSY N/A 11/28/2014   Procedure: CYSTOSCOPY WITH  BLADDER BIOPSY;  Surgeon: [name], MD;  Location: [facility];  Service: Urology;  Laterality: N/A;  . HEMORRHOID SURGERY  03/15/2012   Procedure: HEMORRHOIDECTOMY;  Surgeon: [name], MD;  Location: [facility];  Service: General;  Laterality: N/A;  . HERNIA REPAIR    . INGUINAL HERNIA REPAIR Bilateral 2006  . INTRAVASCULAR ULTRASOUND/IVUS N/A 03/13/2020   Procedure: Intravascular Ultrasound/IVUS;  Surgeon: Elam Dutch, MD;  Location: [facility];  Service: Cardiovascular;  Laterality: N/A;  . IR RADIOLOGIST EVAL & MGMT  04/30/2020  . IVC VENOGRAPHY N/A 03/13/2020   Procedure: IVC Venography;  Surgeon: Elam Dutch, MD;  Location: [place] [facility];  Service: Cardiovascular;  Laterality: N/A;  . LOWER EXTREMITY VENOGRAPHY Left 03/13/2020   Procedure: LOWER EXTREMITY VENOGRAPHY;  Surgeon: Elam Dutch, MD;  Location: [facility];  Service: Cardiovascular;  Laterality: Left;  .  MASS EXCISION Left 11/12/2018   Procedure: EXCISION MASS LEFT HAND;  Surgeon: Daryll Brod, MD;  Location: [facility];  Service: Orthopedics;  Laterality: Left;  FAB  . PERIPHERAL VASCULAR INTERVENTION  03/13/2020   Procedure: PERIPHERAL VASCULAR INTERVENTION;  Surgeon: Elam Dutch, MD;  Location: [facility];  Service: Cardiovascular;;  . PERIPHERAL VASCULAR THROMBECTOMY N/A 03/13/2020   Procedure: PERIPHERAL VASCULAR THROMBECTOMY;  Surgeon: Elam Dutch, MD;  Location: [facility];  Service: Cardiovascular;  Laterality: N/A;  . RADIOLOGY WITH ANESTHESIA N/A 02/20/2020   Procedure: MRI LUMBAR W/O CONTRAST  WITH ANESTHESIA;  Surgeon: Radiologist, Medication, MD;  Location: [facility];  Service: Radiology;  Laterality: N/A;  . TESTICLE SURGERY  2004   Ruptured Undescended Right testicle   . TRANSURETHRAL RESECTION OF BLADDER TUMOR WITH GYRUS (TURBT-GYRUS) N/A 05/23/2014   Procedure: TRANSURETHRAL RESECTION OF BLADDER TUMOR WITH GYRUS (TURBT-GYRUS);  Surgeon: [name], MD;  Location: [facility];  Service: Urology;  Laterality: N/A;  . WISDOM TOOTH EXTRACTION      Allergies: Patient has no known allergies.  Medications: Prior to Admission medications   Medication Sig Start Date End Date Taking? Authorizing Provider  ASPIRIN ADULT LOW STRENGTH 81 MG EC tablet TAKE 1 TABLET BY MOUTH EVERY DAY Patient taking differently: Take 81 mg by mouth daily.  10/03/19   Patwardhan, Reynold Bowen, MD  dexamethasone (DECADRON) 4 MG tablet Take two tablets (8 mg total) by mouth daily.  Take daily times three days starting the day after Cisplatin chemotherapy.  Take with food. 03/11/20   Volanda Napoleon, MD  enoxaparin (LOVENOX) 80 MG/0.8ML injection INJECT 0.8 MLS (80 MG TOTAL) INTO THE SKIN EVERY 12 (TWELVE) HOURS. 04/22/20 05/22/20  Volanda Napoleon, MD  fentaNYL (DURAGESIC) 25 MCG/HR Place 1 patch onto the skin every 3 (three) days. 04/08/20   [place], Holli Humbles, NP  gabapentin (NEURONTIN) 600 MG tablet Take 1 tablet (600 mg total) by mouth in the morning, at noon, in the evening, and at bedtime. 04/13/20   Volanda Napoleon, MD  LORazepam (ATIVAN) 0.5 MG tablet TAKE 1 TABLET (0.5 MG TOTAL) BY MOUTH EVERY 6 (SIX) HOURS AS NEEDED FOR ANXIETY. 04/28/20   Volanda Napoleon, MD  meloxicam (MOBIC) 15 MG tablet Take 1 tablet (15 mg total) by mouth daily. 03/20/20   Volanda Napoleon, MD  ondansetron (ZOFRAN) 8 MG tablet Take 1 tablet (8 mg total) by mouth 2 (two) times  daily as needed for nausea or vomiting. Start on third day after Cisplatin chemotherapy 03/11/20   Volanda Napoleon, MD  orphenadrine (NORFLEX) 100 MG tablet Take 1 tablet (100 mg total) by mouth 2 (two) times daily. 04/22/20   Volanda Napoleon, MD  Oxycodone HCl 10 MG TABS Take 1 tablet (10 mg total) by mouth every 4 (four) hours as needed. 03/20/20   Volanda Napoleon, MD  pantoprazole (PROTONIX) 40 MG tablet Take 1 tablet (40 mg total) by mouth daily. 04/03/20   [place], Holli Humbles, NP  prochlorperazine (COMPAZINE) 10 MG tablet Take 1 tablet (10 mg total) by mouth every 6 (six) hours as needed for nausea or vomiting. 03/11/20   Volanda Napoleon, MD  zolpidem (AMBIEN) 10 MG tablet TAKE 1 TABLET (10 MG TOTAL) BY MOUTH AT BEDTIME AS NEEDED FOR SLEEP. 04/07/20 05/07/20  Volanda Napoleon, MD     Family History  Problem Relation Age of Onset  . Cancer Mother  leukemia  . Diabetes Brother     Social History   Socioeconomic History  . Marital status: Married    Spouse name: Not on file  . Number of children: 2  . Years of education: Not on file  . Highest education level: Not on file  Occupational History  . Not on file  Tobacco Use  . Smoking status: Light Tobacco Smoker    Packs/day: 0.50    Years: 30.00    Pack years: 15.00    Types: Cigarettes    Last attempt to quit: 01/01/2019    Years since quitting: 1.3  . Smokeless tobacco: Never Used  Vaping Use  . Vaping Use: Never used  Substance and Sexual Activity  . Alcohol use: Not Currently    Comment: social  . Drug use: No  . Sexual activity: Not on file  Other Topics Concern  . Not on file  Social History Narrative  . Not on file   Social Determinants of Health   Financial Resource Strain:   . Difficulty of Paying Living Expenses: Not on file  Food Insecurity:   . Worried About Charity fundraiser in the Last Year: Not on file  . Ran Out of Food in the Last Year: Not on file  Transportation Needs:   . Lack of  Transportation (Medical): Not on file  . Lack of Transportation (Non-Medical): Not on file  Physical Activity:   . Days of Exercise per Week: Not on file  . Minutes of Exercise per Session: Not on file  Stress:   . Feeling of Stress : Not on file  Social Connections:   . Frequency of Communication with Friends and Family: Not on file  . Frequency of Social Gatherings with Friends and Family: Not on file  . Attends Religious Services: Not on file  . Active Member of Clubs or Organizations: Not on file  . Attends Archivist Meetings: Not on file  . Marital Status: Not on file    ECOG Status: 2 - Symptomatic, <50% confined to bed  Review of Systems: A 12 point ROS discussed and pertinent positives are indicated in the HPI above.  All other systems are negative.  Review of Systems  Vital Signs: There were no vitals taken for this visit.  Physical Exam General: 64 yo male, fit, appearing stated age.  Well-developed, well-nourished.  No distress. HEENT: Atraumatic, normocephalic.  Conjugate gaze, extra-ocular motor intact. No scleral icterus or scleral injection. No lesions on external ears, nose, lips, or gums.  Oral mucosa moist, pink.  Neck: Symmetric with no goiter enlargement.  Chest/Lungs:  Symmetric chest with inspiration/expiration.  No labored breathing.  Clear to auscultation with no wheezes, rhonchi, or rales.  Heart:  RRR, with no third heart sounds appreciated. No JVD appreciated.  Abdomen:  Soft, NT/ND, with + bowel sounds.   Genito-urinary: Deferred Neurologic: Alert & Oriented to person, place, and time.   Normal affect and insight.  Appropriate questions.  Moving all 4 extremities with gross sensory intact.  Pulse Exam:  Palpable DP pulses Extremities:  Left leg circumference is 3cm larger in the thigh, calf, and the ankle.  Redness/discoloration of the foot. Healed ulcer on the left dorsal forefoot.  No pitting edema at this time.   Mallampati Score:      Imaging: VAS [place] IVC/ILIAC (VENOUS ONLY)  Result Date: 04/02/2020 IVC/ILIAC STUDY Indications: Surgery date 03/13/2020. Vascular Interventions: Procedure: 1. Left lower extremity popliteal superficial  femoral common femoral external iliac common iliac                         inferior vena cava ascending venogram                         2. Inari mechanical thrombectomy left common iliac,                         external iliac, common femoral, superficial femoral and                         popliteal vein                         3. Stent left common external iliac and common femoral                         vein (18 x 90 Wallstent x2)                         4. IVUS inferior vena cava left common iliac external                         iliac common femoral superficial femoral popliteal vein                          Preoperative diagnosis: Acute left iliofemoral DVT. Limitations: Air/bowel gas.  Performing Technologist: Ronal Fear RVS, RCS  Examination Guidelines: A complete evaluation includes B-mode imaging, spectral Doppler, color Doppler, and power Doppler as needed of all accessible portions of each vessel. Bilateral testing is considered an integral part of a complete examination. Limited examinations for reoccurring indications may be performed as noted.  IVC/Iliac Findings: +----------+------+--------+--------+    IVC    PatentThrombusComments +----------+------+--------+--------+ IVC Mid          acute           +----------+------+--------+--------+ IVC Distal       acute           +----------+------+--------+--------+  +-------------------+---------+-----------+---------+-----------+--------+         CIV        RT-PatentRT-ThrombusLT-PatentLT-ThrombusComments +-------------------+---------+-----------+---------+-----------+--------+ Common Iliac Prox                                  acute             +-------------------+---------+-----------+---------+-----------+--------+ Common Iliac Mid                                   acute            +-------------------+---------+-----------+---------+-----------+--------+ Common Iliac Distal                                acute            +-------------------+---------+-----------+---------+-----------+--------+  +-------------------------+---------+-----------+---------+-----------+--------+            EIV           RT-PatentRT-ThrombusLT-PatentLT-ThrombusComments +-------------------------+---------+-----------+---------+-----------+--------+ External Iliac Vein Prox  acute            +-------------------------+---------+-----------+---------+-----------+--------+ External Iliac Vein Mid                                  acute            +-------------------------+---------+-----------+---------+-----------+--------+ External Iliac Vein                                      acute            Distal                                                                    +-------------------------+---------+-----------+---------+-----------+--------+    Summary: IVC/Iliac: There is 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 There is evidence of chronic thrombus involving the left external iliac vein. Visualization of proximal Inferior Vena Cava, mid inferior vena cava and distal Inferior Vena Cava was limited due to bowel gas. - No color or spectral Doppler flow observed in the IVC, left common iliac vein stent and external iliac vein stent.  *See table(s) above for measurements and observations.  Electronically signed by Ruta Hinds MD on 04/02/2020 at 8:51:36 AM.    Final    VAS [place] LOWER EXTREMITY VENOUS (DVT)  Result Date: 04/02/2020  Lower Venous DVTStudy Indications: Edema. 03/13/2020: Thrombectomy of left common iliac, external iliac, common femoral, superficial  femoral and popliteal veins. Stenting of left common, external and common femoral veins  Performing Technologist: Ronal Fear RVS, RCS  Examination Guidelines: A complete evaluation includes B-mode imaging, spectral Doppler, color Doppler, and power Doppler as needed of all accessible portions of each vessel. Bilateral testing is considered an integral part of a complete examination. Limited examinations for reoccurring indications may be performed as noted. The reflux portion of the exam is performed with the patient in reverse Trendelenburg.  +---------+---------------+---------+-----------+----------+--------------+ LEFT     CompressibilityPhasicitySpontaneityPropertiesThrombus Aging +---------+---------------+---------+-----------+----------+--------------+ CFV      None                                         Acute          +---------+---------------+---------+-----------+----------+--------------+ FV Prox  None                                         Acute          +---------+---------------+---------+-----------+----------+--------------+ FV Mid   None                                         Acute          +---------+---------------+---------+-----------+----------+--------------+ FV DistalNone  Acute          +---------+---------------+---------+-----------+----------+--------------+ POP      None                                         Acute          +---------+---------------+---------+-----------+----------+--------------+ PTV      None                                         Acute          +---------+---------------+---------+-----------+----------+--------------+ PERO     None                                         Acute          +---------+---------------+---------+-----------+----------+--------------+ Gastroc  None                                         Acute           +---------+---------------+---------+-----------+----------+--------------+ GSV      Full                                                        +---------+---------------+---------+-----------+----------+--------------+ SSV      Full                                                        +---------+---------------+---------+-----------+----------+--------------+     Summary: LEFT: - Findings consistent with occlusive and acute deep vein thrombosis involving the left common femoral vein, superficial femoral vein, popliteal vein, posterior tibial veins and peroneal veins.  *See table(s) above for measurements and observations. Electronically signed by Ruta Hinds MD on 04/02/2020 at 9:15:35 AM.    Final    IR Radiologist Eval & Mgmt  Result Date: 04/30/2020 Please refer to notes tab for details about interventional procedure. (Op Note)   Labs:  CBC: Recent Labs    03/18/20 0858 03/25/20 0941 04/01/20 0937 04/08/20 0846  WBC 10.0 7.8 6.5 5.2  HGB 8.8* 8.9* 9.7* 10.0*  HCT 26.4* 27.5* 29.2* 30.2*  PLT 272 246 209 177    COAGS: Recent Labs    03/02/20 0105  INR 1.0    BMP: Recent Labs    03/02/20 0105 03/02/20 0105 03/03/20 0733 03/03/20 0733 03/04/20 0337 03/04/20 0337 03/11/20 0813 03/11/20 0813 03/18/20 0858 03/25/20 0941 04/01/20 0937 04/08/20 0846  NA 135   < > 134*   < > 134*   < > 134*   < > 129* 134* 135 135  K 5.1   < > 4.8   < > 4.1   < > 4.8   < > 4.2 4.3 4.2 4.4  CL 98   < > 98   < > 100   < >  98   < > 94* 101 102 103  CO2 28   < > 25   < > 26   < > 32   < > 27 26 27 27   GLUCOSE 119*   < > 121*   < > 131*   < > 94   < > 121* 99 102* 96  BUN 21   < > 18   < > 13   < > 14   < > 22 18 14 12   CALCIUM 9.3   < > 9.3   < > 8.7*   < > 10.2   < > 9.5 9.5 9.5 9.3  CREATININE 0.94   < > 0.74   < > 0.69  --  0.72   < > 0.90 0.76 0.61 0.66  GFRNONAA >60   < > >60   < > >60  --  >60   < > >60 >60 >60 >60  GFRAA >60  --  >60  --  >60  --  >60  --   --    --   --   --    < > = values in this interval not displayed.    LIVER FUNCTION TESTS: Recent Labs    03/18/20 0858 03/25/20 0941 04/01/20 0937 04/08/20 0846  BILITOT 0.5 0.4 0.3 0.4  AST 14* 11* 12* 11*  ALT 19 18 16 14   ALKPHOS 90 74 82 77  PROT 6.9 6.7 7.0 6.8  ALBUMIN 3.6 3.6 3.7 3.3*    TUMOR MARKERS: No results for input(s): AFPTM, CEA, CA199, CHROMGRNA in the last 8760 hours.  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   Given his symptoms on Vill[place] scale, he is in the severe category.   I had a discussion with him and his wife regarding his veno-thrombo-embolic disease, and the meaning of severe PTS.  As he desires to lead an active lifestyle, he has a very high motivation to relieve his symptoms.    Our plan will be to start with diagnostics to establish the pattern of pelvic occlusion in the setting of his prior stent and known malignancy, as well as get a look at the left leg DVT. He understands and would like to repeat the imaging with possible intention to treat.   Plan: - CTA abdomen pelvis (venous protocol) to evaluate the pelvic venous anatomy - Repeat left leg duplex for DVT in the setting of severe and worsening post-thrombotic syndrome  -we will follow up with him once the imaging has been performed - continue current lovenox and anti-platelet strategy    Electronically Signed: Corrie Mckusick 04/30/2020, 11:47 AM   I spent a total of  40 Minutes   in face to face in clinical consultation, greater than 50% of which was counseling/coordinating care for severe post-thrombotic syndrome, left leg DVT

## 2020-05-01 ENCOUNTER — Other Ambulatory Visit: Payer: Self-pay | Admitting: Hematology & Oncology

## 2020-05-01 DIAGNOSIS — I82402 Acute embolism and thrombosis of unspecified deep veins of left lower extremity: Secondary | ICD-10-CM

## 2020-05-01 MED FILL — oxyCODONE HCL 10 MG TABS: 10 | 15 days supply | Qty: 90 | Fill #0

## 2020-05-01 MED FILL — ENOXAPARIN SODIUM 80 MG/0.8: 80 | 12 days supply | Qty: 19 | Fill #0

## 2020-05-04 ENCOUNTER — Other Ambulatory Visit: Payer: Self-pay | Admitting: Interventional Radiology

## 2020-05-04 ENCOUNTER — Other Ambulatory Visit: Payer: Self-pay | Admitting: *Deleted

## 2020-05-04 ENCOUNTER — Ambulatory Visit: Payer: 59 | Attending: Internal Medicine

## 2020-05-04 ENCOUNTER — Other Ambulatory Visit (HOSPITAL_BASED_OUTPATIENT_CLINIC_OR_DEPARTMENT_OTHER): Payer: Self-pay | Admitting: Internal Medicine

## 2020-05-04 DIAGNOSIS — I871 Compression of vein: Secondary | ICD-10-CM

## 2020-05-04 DIAGNOSIS — Z23 Encounter for immunization: Secondary | ICD-10-CM

## 2020-05-04 DIAGNOSIS — C679 Malignant neoplasm of bladder, unspecified: Secondary | ICD-10-CM

## 2020-05-04 DIAGNOSIS — I82422 Acute embolism and thrombosis of left iliac vein: Secondary | ICD-10-CM

## 2020-05-04 DIAGNOSIS — I87002 Postthrombotic syndrome without complications of left lower extremity: Secondary | ICD-10-CM

## 2020-05-04 DIAGNOSIS — C799 Secondary malignant neoplasm of unspecified site: Secondary | ICD-10-CM

## 2020-05-04 MED ORDER — FENTANYL 50 MCG/HR TD PT72
1.0000 | MEDICATED_PATCH | TRANSDERMAL | 0 refills | Status: DC
Start: 2020-05-04 — End: 2020-06-01

## 2020-05-04 MED FILL — MODERNA COVID-19 VACCINE 10: 100 | 1 days supply | Qty: 0 | Fill #0

## 2020-05-04 MED FILL — FLUARIX QUADRIVALENT 0.5 ML: 0.5 | 1 days supply | Qty: 1 | Fill #0

## 2020-05-04 MED FILL — fentaNYL 50 MCG/HR PT72: 50 | 30 days supply | Qty: 10 | Fill #0

## 2020-05-04 NOTE — Progress Notes (Signed)
   Covid-19 Vaccination Clinic  Name:  Brian Benton    MRN: [id number] DOB: July 01, 1955  05/04/2020  [name] was observed post Covid-19 immunization for 15 minutes without incident. He was provided with Vaccine Information Sheet and instruction to access the V-Safe system.   [name] was instructed to call 911 with any severe reactions post vaccine: Marland Kitchen Difficulty breathing  . Swelling of face and throat  . A fast heartbeat  . A bad rash all over body  . Dizziness and weakness   Immunizations Administered    No immunizations on file.     Vaccine given by Beckey Rutter, pharmacy student

## 2020-05-06 MED FILL — ZOLPIDEM TARTRATE 10 MG TAB: 10 | 30 days supply | Qty: 30 | Fill #0

## 2020-05-06 MED FILL — GABAPENTIN 600 MG TABLET: 600 | 30 days supply | Qty: 120 | Fill #1

## 2020-05-12 ENCOUNTER — Ambulatory Visit
Admission: RE | Admit: 2020-05-12 | Discharge: 2020-05-12 | Disposition: A | Discharge status: Home / Self Care | Payer: 59 | Source: Ambulatory Visit | Attending: Interventional Radiology | Admitting: Interventional Radiology

## 2020-05-12 DIAGNOSIS — I87002 Postthrombotic syndrome without complications of left lower extremity: Secondary | ICD-10-CM

## 2020-05-12 MED FILL — LORazepam 0.5 MG TABS: 0.5 | 7 days supply | Qty: 30 | Fill #1

## 2020-05-14 ENCOUNTER — Other Ambulatory Visit: Payer: Self-pay | Admitting: Hematology & Oncology

## 2020-05-14 ENCOUNTER — Telehealth: Payer: Self-pay | Admitting: *Deleted

## 2020-05-14 ENCOUNTER — Ambulatory Visit
Admission: RE | Admit: 2020-05-14 | Discharge: 2020-05-14 | Disposition: A | Discharge status: Home / Self Care | Payer: 59 | Source: Ambulatory Visit | Attending: Radiation Oncology | Admitting: Radiation Oncology

## 2020-05-14 DIAGNOSIS — I82402 Acute embolism and thrombosis of unspecified deep veins of left lower extremity: Secondary | ICD-10-CM

## 2020-05-14 NOTE — Telephone Encounter (Signed)
CALLED PATIENT TO INFORM THAT I DO NOT HAVE A FU FOR 05-15-20 DUE TO DR. KINARD BEING OFF ON 05-15-20, SCHEDULED FU ON 05-18-20 @ 3:15 PM, PATIENT AGREED TO DATE AND TIME

## 2020-05-15 ENCOUNTER — Other Ambulatory Visit: Payer: Self-pay | Admitting: Hematology & Oncology

## 2020-05-15 ENCOUNTER — Other Ambulatory Visit: Payer: Self-pay

## 2020-05-15 ENCOUNTER — Encounter: Payer: Self-pay | Admitting: *Deleted

## 2020-05-15 ENCOUNTER — Ambulatory Visit (HOSPITAL_COMMUNITY)
Admission: RE | Admit: 2020-05-15 | Discharge: 2020-05-15 | Disposition: A | Discharge status: Home / Self Care | Payer: 59 | Source: Ambulatory Visit | Attending: Family | Admitting: Family

## 2020-05-15 DIAGNOSIS — C799 Secondary malignant neoplasm of unspecified site: Secondary | ICD-10-CM | POA: Diagnosis not present

## 2020-05-15 DIAGNOSIS — I82422 Acute embolism and thrombosis of left iliac vein: Secondary | ICD-10-CM | POA: Insufficient documentation

## 2020-05-15 DIAGNOSIS — C679 Malignant neoplasm of bladder, unspecified: Secondary | ICD-10-CM | POA: Insufficient documentation

## 2020-05-15 DIAGNOSIS — R1900 Intra-abdominal and pelvic swelling, mass and lump, unspecified site: Secondary | ICD-10-CM | POA: Diagnosis present

## 2020-05-15 DIAGNOSIS — I871 Compression of vein: Secondary | ICD-10-CM | POA: Diagnosis present

## 2020-05-15 MED ORDER — GADOBUTROL 1 MMOL/ML IV SOLN
8.0000 mL | Freq: Once | INTRAVENOUS | Status: AC | PRN
  Administered 2020-05-15: 8 mL via INTRAVENOUS

## 2020-05-15 MED FILL — ENOXAPARIN SODIUM 80 MG/0.8: 80 | 12 days supply | Qty: 19 | Fill #0

## 2020-05-15 NOTE — Progress Notes (Signed)
Oncology Nurse Navigator Documentation  Oncology Nurse Navigator Flowsheets 05/15/2020  Abnormal Finding Date -  Confirmed Diagnosis Date -  Diagnosis Status -  Planned Course of Treatment -  Phase of Treatment -  Chemo/Radiation Concurrent Actual Start Date: -  Navigator Follow Up Date: 05/20/2020  Navigator Follow Up Reason: Follow-up Appointment  Navigator Location [facility]  Referral Date to RadOnc/MedOnc -  Navigator Encounter Type Scan Review  Telephone -  Treatment Initiated Date -  Patient Visit Type MedOnc  Treatment Phase Active Tx  Barriers/Navigation Needs Coordination of Care;Education  Education -  Interventions None Required  Acuity Level 2-Minimal Needs (1-2 Barriers Identified)  Referrals -  Coordination of Care -  Education Method -  Support Groups/Services Friends and Family  Time Spent with Patient 30

## 2020-05-17 ENCOUNTER — Other Ambulatory Visit: Payer: Self-pay | Admitting: Hematology & Oncology

## 2020-05-17 DIAGNOSIS — I82402 Acute embolism and thrombosis of unspecified deep veins of left lower extremity: Secondary | ICD-10-CM

## 2020-05-18 ENCOUNTER — Other Ambulatory Visit: Payer: Self-pay | Admitting: Hematology & Oncology

## 2020-05-18 ENCOUNTER — Ambulatory Visit
Admission: RE | Admit: 2020-05-18 | Discharge: 2020-05-18 | Disposition: A | Discharge status: Home / Self Care | Payer: 59 | Source: Ambulatory Visit | Attending: Radiation Oncology | Admitting: Radiation Oncology

## 2020-05-18 ENCOUNTER — Other Ambulatory Visit: Payer: Self-pay

## 2020-05-18 ENCOUNTER — Ambulatory Visit (HOSPITAL_COMMUNITY)
Admission: RE | Admit: 2020-05-18 | Discharge: 2020-05-18 | Disposition: A | Discharge status: Home / Self Care | Payer: 59 | Source: Ambulatory Visit | Attending: Interventional Radiology | Admitting: Interventional Radiology

## 2020-05-18 ENCOUNTER — Encounter (HOSPITAL_COMMUNITY): Payer: Self-pay

## 2020-05-18 ENCOUNTER — Encounter: Payer: Self-pay | Admitting: Radiation Oncology

## 2020-05-18 VITALS — BP 126/79 | HR 75 | Temp 96.7°F | Resp 18 | Ht 72.0 in | Wt 165.2 lb

## 2020-05-18 DIAGNOSIS — M5136 Other intervertebral disc degeneration, lumbar region: Secondary | ICD-10-CM | POA: Insufficient documentation

## 2020-05-18 DIAGNOSIS — C799 Secondary malignant neoplasm of unspecified site: Secondary | ICD-10-CM

## 2020-05-18 DIAGNOSIS — C679 Malignant neoplasm of bladder, unspecified: Secondary | ICD-10-CM | POA: Insufficient documentation

## 2020-05-18 DIAGNOSIS — Z923 Personal history of irradiation: Secondary | ICD-10-CM | POA: Insufficient documentation

## 2020-05-18 DIAGNOSIS — M48061 Spinal stenosis, lumbar region without neurogenic claudication: Secondary | ICD-10-CM | POA: Insufficient documentation

## 2020-05-18 DIAGNOSIS — I700 Atherosclerosis of aorta: Secondary | ICD-10-CM | POA: Insufficient documentation

## 2020-05-18 DIAGNOSIS — C786 Secondary malignant neoplasm of retroperitoneum and peritoneum: Secondary | ICD-10-CM | POA: Insufficient documentation

## 2020-05-18 DIAGNOSIS — I87002 Postthrombotic syndrome without complications of left lower extremity: Secondary | ICD-10-CM | POA: Insufficient documentation

## 2020-05-18 DIAGNOSIS — Z86718 Personal history of other venous thrombosis and embolism: Secondary | ICD-10-CM | POA: Insufficient documentation

## 2020-05-18 DIAGNOSIS — Z79899 Other long term (current) drug therapy: Secondary | ICD-10-CM | POA: Insufficient documentation

## 2020-05-18 LAB — POCT I-STAT CREATININE: Creatinine, Ser: 0.8 mg/dL (ref 0.61–1.24)

## 2020-05-18 MED ORDER — IOHEXOL 350 MG/ML SOLN
100.0000 mL | Freq: Once | INTRAVENOUS | Status: AC | PRN
  Administered 2020-05-18: 100 mL via INTRAVENOUS

## 2020-05-18 MED ORDER — ENOXAPARIN SODIUM 80 MG/0.8ML ~~LOC~~ SOLN: 80.0000 mg | Freq: Two times a day (BID) | SUBCUTANEOUS | 0 refills | Status: DC

## 2020-05-18 NOTE — Progress Notes (Signed)
Patient in for follow up. Having issues with his left leg due to blood flow, is seeing interventional radiology to see if they can fix this. Energy is good works daily and goes to [employer].

## 2020-05-18 NOTE — Progress Notes (Signed)
Radiation Oncology         [phone] ________________________________  Name: Brian Benton MRN: [id number]  Date: 05/18/2020  DOB: 03/16/1956  Follow-Up Visit Note  CC: Aurea Graff, PA-C (Inactive)  Volanda Napoleon, MD    ICD-10-CM   1. 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 He continues on Lovenox and anti-platelet strategy. CTA of abdomen/pelvis (venous protocol) will be ordered to evaluate the pelvic venous anatomy.   On review of systems, he reports improvement in his pain along the left pelvis and lower lumbar spine. He is still bothered with pain, swelling in the left medial thigh area. He is more mobile at this time.  ALLERGIES:  has No Known Allergies.  Meds: Current Outpatient Medications  Medication Sig Dispense Refill  . ASPIRIN ADULT LOW STRENGTH 81 MG EC tablet TAKE 1 TABLET BY MOUTH EVERY DAY (Patient taking differently: Take 81 mg by mouth daily. ) 30 tablet 11  . enoxaparin (LOVENOX) 80 MG/0.8ML injection Inject 0.8 mLs (80 mg total) into the skin every 12 (twelve) hours. 19.2 mL 0  . fentaNYL (DURAGESIC) 50 MCG/HR Place 1 patch onto the skin every 3 (three) days. 10 patch 0  . gabapentin (NEURONTIN) 600 MG tablet Take 1 tablet (600 mg total) by mouth in the morning, at noon, in the evening, and at bedtime.  120 tablet 3  . LORazepam (ATIVAN) 0.5 MG tablet TAKE 1 TABLET (0.5 MG TOTAL) BY MOUTH EVERY 6 (SIX) HOURS AS NEEDED FOR ANXIETY. 30 tablet 1  . meloxicam (MOBIC) 15 MG tablet Take 1 tablet (15 mg total) by mouth daily. 30 tablet 3  . Oxycodone HCl 10 MG TABS TAKE 1 TABLET (10 MG TOTAL) BY MOUTH EVERY 4 (FOUR) HOURS AS NEEDED. 90 tablet 0  . zolpidem (AMBIEN) 10 MG tablet TAKE 1 TABLET (10 MG TOTAL) BY MOUTH AT BEDTIME AS NEEDED FOR SLEEP. 30 tablet 0   No current facility-administered medications for this encounter.    Physical Findings: The patient is in no acute distress. Patient is alert and oriented.  height is 6' (1.829 m) and weight is 165 lb 4 oz (75 kg). His temporal temperature is 96.7 F (35.9 C) (abnormal). His blood pressure is 126/79 and his pulse is 75. His respiration is 18 and oxygen saturation is 98%.  No significant changes. Lungs are clear to auscultation bilaterally. Heart has regular rate and rhythm. No palpable cervical, supraclavicular, or axillary adenopathy. Abdomen soft, non-tender, normal bowel sounds. Patient has lymphedema stockings in place throughout the left lower extremity. As a consequence he has much less edema in the left lower extremity.  Lab Findings: Lab Results  Component Value Date   WBC 5.2 04/08/2020   HGB 10.0 (L) 04/08/2020   HCT 30.2 (L) 04/08/2020   MCV 95.3 04/08/2020   PLT  177 04/08/2020    Radiographic Findings: MR Lumbar Spine W Wo Contrast  Result Date: 05/15/2020 CLINICAL DATA:  Metastatic bladder cell carcinoma. Evaluate response to chemo and radiation. EXAM: MRI LUMBAR SPINE WITHOUT AND WITH CONTRAST TECHNIQUE: Multiplanar and multiecho pulse sequences of the lumbar spine were obtained without and with intravenous contrast. CONTRAST:  93mL GADAVIST GADOBUTROL 1 MMOL/ML IV SOLN COMPARISON:  Lumbar MRI 02/20/2020 FINDINGS: Segmentation:  Normal Alignment:  Mild retrolisthesis L2-3 and L3-4 Vertebrae: Progressive tumor invasion of the  left sacrum. Mild involvement of the lateral L5 vertebral body and left L5 transverse process appears unchanged. No new bony involvement. No fracture. Conus medullaris and cauda equina: Conus extends to the L2 level. Conus and cauda equina appear normal. Paraspinal and other soft tissues: Left iliac lymph node mass appears smaller. There is more cystic change and decrease in size. This encases the left sciatic nerve. Adjacent bony invasion of the left sacral ala has progressed. Involvement of the left iliacus muscle unchanged. Tumor in the left psoas muscle improved in the interval. Disc levels: L1-2: Negative L2-3: Mild retrolisthesis. Mild disc and facet degeneration. Mild subarticular stenosis bilaterally L3-4: Mild disc and mild facet degeneration. Mild subarticular stenosis bilaterally L4-5: Disc degeneration with disc bulging. Shallow left foraminal disc protrusion with annular fissure unchanged from the prior study bilateral facet degeneration and mild subarticular stenosis bilaterally L5-S1: Negative IMPRESSION: Metastatic lymph node mass in the left iliac lymph nodes has decreased in size. Decreased involvement of the left psoas muscle. No change in involvement of the left iliacus muscle Bony involvement involving the left sacrum has progressed. No change in minor involvement of the left lateral L5 vertebral body and transverse process. No new metastatic deposits. Electronically Signed   By: Franchot Gallo M.D.   On: 05/15/2020 14:01   US Venous Img Lower Unilateral Left (DVT)  Result Date: 05/12/2020 CLINICAL DATA:  64 year old male with a history of left-sided DVT, history of malignancy of the left pelvis EXAM: LEFT LOWER EXTREMITY VENOUS DOPPLER ULTRASOUND TECHNIQUE: Gray-scale sonography with graded compression, as well as color Doppler and duplex ultrasound were performed to evaluate the lower extremity deep venous systems from the level of the common femoral vein and including the common femoral,  femoral, profunda femoral, popliteal and calf veins including the posterior tibial, peroneal and gastrocnemius veins when visible. The superficial great saphenous vein was also interrogated. Spectral Doppler was utilized to evaluate flow at rest and with distal augmentation maneuvers in the common femoral, femoral and popliteal veins. COMPARISON:  None. FINDINGS: Contralateral Common Femoral Vein: No evidence of thrombus. Normal compressibility, respiratory phasicity and response to augmentation. Common Femoral Vein: Stent changes of the left common femoral vein, with no significant flow and incompressible vein. Saphenofemoral Junction: Partially compressible junction, with flow maintained in the saphenous vein. Profunda Femoral Vein: Flow maintained within the profunda, with partially compressible junction. Femoral Vein: Partially compressible femoral vein with minimal flow maintained. Popliteal Vein: Popliteal vein partially compressible with flow maintained. Calf Veins: Posterior tibial veins with flow maintained. Peroneal veins with flow maintained. Superficial Great Saphenous Vein: No evidence of thrombus. Normal compressibility and flow on color Doppler imaging. Other Findings:  None. IMPRESSION: Sonographic survey of the left lower extremity demonstrates stent changes of the common femoral vein, with persisting DVT of the left common femoral vein stent, occlusive. Positive duplex for DVT at the SFJ, junction of the profunda vein, and through the femoral vein segment. Electronically Signed   By: Corrie Mckusick D.O.  On: 05/12/2020 12:58   IR Radiologist Eval & Mgmt  Result Date: 04/30/2020 Please refer to notes tab for details about interventional procedure. (Op Note)  CT Angio Abd/Pel w/ and/or w/o  Result Date: 05/18/2020 CLINICAL DATA:  64 year old male with history of left lower extremity deep vein thrombosis secondary to extrinsic compression from metastatic pelvic malignancy presenting with post  thrombotic syndrome after left ileo femoral thrombectomy and stent placement. EXAM: CTA ABDOMEN AND PELVIS WITH CONTRAST TECHNIQUE: Multidetector CT imaging of the abdomen and pelvis was performed using the standard protocol during and after bolus administration of intravenous contrast. Multiplanar reconstructed images and MIPs were obtained and reviewed to evaluate the vascular anatomy. CONTRAST:  175mL OMNIPAQUE IOHEXOL 350 MG/ML SOLN COMPARISON:  03/02/2020, 03/20/2019 FINDINGS: VASCULAR IVC: The suprarenal inferior vena cava is patent and normal in caliber. Approximately 4.5 cm inferior to the level of the left renal vein is nonocclusive thrombus extending from the left iliac vein into the inferior vena cava, eccentricly favoring the left side of the IVC. Renal veins: Widely patent. Right iliofemoral veins: Normal caliber and patent throughout. Left iliofemoral veins: Indwelling stents throughout the left common iliac, external iliac, and common femoral vein terminating just central to the saphenofemoral junction. This stents are occluded throughout. In the proximal external iliac vein, there is a free-floating wall of the indwelling stent, best visualized on sagittal series 4, images 99-103. The central superficial and profunda femoral veins appear patent, slightly enlarged compared to the right, likely secondary to congestion. There are no appreciable major intrapelvic or ascending lumbar collateral veins appreciated. Portal system: Widely patent common normal caliber. Hepatic veins: Widely patent. Arterial system: Scattered atherosclerotic calcifications throughout the abdominal aorta, most prominent in the infrarenal portion with mild fusiform ectasia of the infrarenal abdominal aorta measuring up to 2.6 cm. The inflow proximal outflow vessels appear patent. No evidence of visceral ostial occlusion. Review of the MIP images confirms the above findings. NON-VASCULAR Lower chest: Similar appearing linear  atelectasis in the bilateral lower lobes. Moderate centrilobular emphysema. The heart is normal in size. No pericardial effusion. Hepatobiliary: No focal liver abnormality is seen. No gallstones, gallbladder wall thickening, or biliary dilatation. Pancreas: Unremarkable. No pancreatic ductal dilatation or surrounding inflammatory changes. Spleen: Normal in size without focal abnormality. Adrenals/Urinary Tract: Adrenal glands are unremarkable. Simple appearing multifocal simple renal cysts, the largest about the left inferior pole, partially exophytic medially, measuring up to 2.5 cm. Kidneys are otherwise normal, without renal calculi, focal lesion, or hydronephrosis. Bladder is nondistended. Stomach/Bowel: Stomach is within normal limits. Appendix appears normal. Large stool burden. No evidence of bowel wall thickening, distention, or inflammatory changes. Lymphatic: No abdominopelvic adenopathy. Reproductive: Prostate is unremarkable. Other: Continued decreased size of previously visualized left lower paraspinal mass now measuring up to approximately 5.6 x 2.6 cm in maximum axial dimension, approximately 6.7 x 3.4 cm at the same location on 03/19/2020 PET-CT by my measurements. There is similar appearing associated cortical destruction of the superior, anterior sacral ala at this level. No ascites. Musculoskeletal: No acute abnormality. IMPRESSION: VASCULAR Complete occlusion of the indwelling left iliofemoral venous stent with extension of nonocclusive thrombus into the infrarenal inferior vena cava. Aortic Atherosclerosis (ICD10-I70.0). NON-VASCULAR Suggestion favorable treatment response with continued decreased size of the previously visualized left lower paraspinal mass. Unchanged associated osseous destruction of the anterior aspect superior left sacral ala. 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 Recent CT scan and MRI shows favorable shrinkage of the paraspinal mass and he is having less pain in this area. He continues to have a DVT in this area and is being evaluated by interventional radiology for treatment.  Plan: The patient is scheduled to follow up with Dr. Marin Olp on 05/20/2020. He will follow up with radiation oncology in as needed basis in light of his close follow-up with medical oncology.     ____________________________________   Blair Promise, PhD, MD  This document serves as a record of services personally performed by Gery Pray, MD. It was created on his behalf by Clerance Lav, a trained medical scribe. The creation of this record is based on the scribe's personal observations and the provider's statements to them. This document has been checked and approved by the attending provider.

## 2020-05-19 ENCOUNTER — Other Ambulatory Visit: Payer: Self-pay | Admitting: Interventional Radiology

## 2020-05-19 DIAGNOSIS — M7989 Other specified soft tissue disorders: Secondary | ICD-10-CM

## 2020-05-20 ENCOUNTER — Other Ambulatory Visit: Payer: Self-pay | Admitting: Hematology & Oncology

## 2020-05-20 ENCOUNTER — Inpatient Hospital Stay (HOSPITAL_BASED_OUTPATIENT_CLINIC_OR_DEPARTMENT_OTHER): Payer: 59 | Admitting: Hematology & Oncology

## 2020-05-20 ENCOUNTER — Inpatient Hospital Stay: Payer: 59 | Attending: Hematology & Oncology

## 2020-05-20 ENCOUNTER — Inpatient Hospital Stay: Payer: 59

## 2020-05-20 ENCOUNTER — Encounter: Payer: Self-pay | Admitting: *Deleted

## 2020-05-20 ENCOUNTER — Other Ambulatory Visit: Payer: Self-pay

## 2020-05-20 ENCOUNTER — Encounter: Payer: Self-pay | Admitting: Hematology & Oncology

## 2020-05-20 VITALS — BP 114/76 | HR 83 | Temp 98.3°F | Resp 18 | Wt 163.0 lb

## 2020-05-20 DIAGNOSIS — R1900 Intra-abdominal and pelvic swelling, mass and lump, unspecified site: Secondary | ICD-10-CM

## 2020-05-20 DIAGNOSIS — C679 Malignant neoplasm of bladder, unspecified: Secondary | ICD-10-CM | POA: Insufficient documentation

## 2020-05-20 DIAGNOSIS — R222 Localized swelling, mass and lump, trunk: Secondary | ICD-10-CM

## 2020-05-20 DIAGNOSIS — Z5189 Encounter for other specified aftercare: Secondary | ICD-10-CM | POA: Insufficient documentation

## 2020-05-20 DIAGNOSIS — I82402 Acute embolism and thrombosis of unspecified deep veins of left lower extremity: Secondary | ICD-10-CM | POA: Diagnosis not present

## 2020-05-20 DIAGNOSIS — C799 Secondary malignant neoplasm of unspecified site: Secondary | ICD-10-CM

## 2020-05-20 DIAGNOSIS — I82422 Acute embolism and thrombosis of left iliac vein: Secondary | ICD-10-CM

## 2020-05-20 DIAGNOSIS — I871 Compression of vein: Secondary | ICD-10-CM

## 2020-05-20 DIAGNOSIS — Z5111 Encounter for antineoplastic chemotherapy: Secondary | ICD-10-CM | POA: Insufficient documentation

## 2020-05-20 LAB — CMP (CANCER CENTER ONLY)
ALT: 15 U/L (ref 0–44)
AST: 15 U/L (ref 15–41)
Albumin: 4.1 g/dL (ref 3.5–5.0)
Alkaline Phosphatase: 42 U/L (ref 38–126)
Anion gap: 6 (ref 5–15)
BUN: 16 mg/dL (ref 8–23)
CO2: 29 mmol/L (ref 22–32)
Calcium: 10.2 mg/dL (ref 8.9–10.3)
Chloride: 104 mmol/L (ref 98–111)
Creatinine: 0.89 mg/dL (ref 0.61–1.24)
GFR, Estimated: >60 mL/min (ref >60)
Glucose, Bld: 117 mg/dL — ABNORMAL HIGH (ref 70–99)
Potassium: 3.9 mmol/L (ref 3.5–5.1)
Sodium: 139 mmol/L (ref 135–145)
Total Bilirubin: 0.3 mg/dL (ref 0.3–1.2)
Total Protein: 7 g/dL (ref 6.5–8.1)

## 2020-05-20 LAB — CBC WITH DIFFERENTIAL (CANCER CENTER ONLY)
Abs Immature Granulocytes: 0.01 10*3/uL (ref 0.00–0.07)
Basophils Absolute: 0.1 10*3/uL (ref 0.0–0.1)
Basophils Relative: 2 %
Eosinophils Absolute: 0.4 10*3/uL (ref 0.0–0.5)
Eosinophils Relative: 8 %
HCT: 35.6 % — ABNORMAL LOW (ref 39.0–52.0)
Hemoglobin: 11.6 g/dL — ABNORMAL LOW (ref 13.0–17.0)
Immature Granulocytes: 0 %
Lymphocytes Relative: 18 %
Lymphs Abs: 0.9 10*3/uL (ref 0.7–4.0)
MCH: 32.9 pg (ref 26.0–34.0)
MCHC: 32.6 g/dL (ref 30.0–36.0)
MCV: 100.8 fL — ABNORMAL HIGH (ref 80.0–100.0)
Monocytes Absolute: 0.6 10*3/uL (ref 0.1–1.0)
Monocytes Relative: 11 %
Neutro Abs: 3.1 10*3/uL (ref 1.7–7.7)
Neutrophils Relative %: 61 %
Platelet Count: 287 10*3/uL (ref 150–400)
RBC: 3.53 MIL/uL — ABNORMAL LOW (ref 4.22–5.81)
RDW: 19.1 % — ABNORMAL HIGH (ref 11.5–15.5)
WBC Count: 5.1 10*3/uL (ref 4.0–10.5)
nRBC: 0 % (ref 0.0–0.2)

## 2020-05-20 LAB — MAGNESIUM: Magnesium: 1.9 mg/dL (ref 1.7–2.4)

## 2020-05-20 LAB — LACTATE DEHYDROGENASE: LDH: 138 U/L (ref 98–192)

## 2020-05-20 MED ORDER — ENOXAPARIN SODIUM 80 MG/0.8ML ~~LOC~~ SOLN: 80.0000 mg | Freq: Two times a day (BID) | SUBCUTANEOUS | 6 refills | Status: DC

## 2020-05-20 MED ORDER — LORAZEPAM 0.5 MG PO TABS: 0.5000 mg | ORAL_TABLET | Freq: Four times a day (QID) | ORAL | 1 refills | Status: DC | PRN

## 2020-05-20 MED FILL — LORazepam 0.5 MG TABS: 0.5 | 23 days supply | Qty: 90 | Fill #0

## 2020-05-20 NOTE — Progress Notes (Signed)
Patient seen this morning and will begin a new regimen. Patient needs echo, PET and port placement all completed prior to initiating treatment. Initially start was planned for next week, however, port cannot be placed until 06/01/2020. Spoke to Dr Marin Olp and we will plan to start treatment on 06/02/20.  Message sent to schedulers to schedule first treatment for 06/02/20 and to reschedule follow up appointments to reflect new timing for cycle two.  Called patient to review all appointments and he stated he was busy. Would like a call tomorrow. Will follow up tomorrow.  Oncology Nurse Navigator Documentation  Oncology Nurse Navigator Flowsheets 05/20/2020  Abnormal Finding Date -  Confirmed Diagnosis Date -  Diagnosis Status -  Planned Course of Treatment -  Phase of Treatment -  Chemo/Radiation Concurrent Actual Start Date: -  Navigator Follow Up Date: 05/21/2020  Navigator Follow Up Reason: Appointment Review  Navigator Location [facility]  Referral Date to RadOnc/MedOnc -  Navigator Encounter Type Follow-up Appt  Telephone -  Treatment Initiated Date -  Patient Visit Type MedOnc  Treatment Phase Active Tx  Barriers/Navigation Needs Coordination of Care;Education  Education Other  Interventions Coordination of Care;Education;Psycho-Social Support  Acuity Level 2-Minimal Needs (1-2 Barriers Identified)  Referrals Other  Coordination of Care Appts  Education Method Verbal  Support Groups/Services Friends and Family  Time Spent with Patient 74

## 2020-05-20 NOTE — Progress Notes (Signed)
START OFF PATHWAY REGIMEN - Bladder   [id number]:Dose-dense MVAC (split-dose cisplatin) q14 days:   A cycle is every 14 days:     Methotrexate      Vinblastine      Doxorubicin      Cisplatin      Pegfilgrastim-xxxx   **Always confirm dose/schedule in your pharmacy ordering system**  Patient Characteristics: Advanced/Metastatic Disease, First Line, Prior Platinum-Based Therapy, Relapse Within 12 Months, FGFR2/FGFR3 Mutation Negative Unknown, Candidate for PD-1/PD-L1 Inhibitor Therapeutic Status: Advanced/Metastatic Disease Line of Therapy: First Line Prior Platinum-Based Therapy<= Yes Time to Relapse: Relapse Within 12 Months FGFR2/FGFR3 Mutation Status: Negative PD-1/PD-L1 Inhibitor Candidate Status: Candidate for PD-1/PD-L1 Inhibitor Intent of Therapy: Non-Curative / Palliative Intent, Discussed with Patient

## 2020-05-21 ENCOUNTER — Telehealth: Payer: Self-pay

## 2020-05-21 ENCOUNTER — Encounter: Payer: Self-pay | Admitting: *Deleted

## 2020-05-21 NOTE — Progress Notes (Signed)
Hematology and Oncology Follow Up Visit  Brian Benton [id number] Dec 20, 1955 64 y.o. 05/21/2020   Principle Diagnosis:   Metastatic bladder cancer-localized disease  Extensive thrombus of the left leg-status post thrombectomy and iliac stent  Current Therapy:    Curative radiation with weekly cis-platinum-status post 6 cycle of weekly cis-platinum  --  Completed 04/08/2020  Lovenox 80 mg subcu twice daily     Interim History:  Brian Benton is back for follow-up.  He is improving slowly but surely.  He is walking and now.  He has a cane.  We did do his studies after his radiation and chemotherapy.  He had MRI that was done.  This was done on 05/15/2020.  This showed a decrease in the left iliac lymph node mass.  There is more cystic changes.  There is still some encasement of the left sciatic nerve.  There was also report to be some adjacent bony invasion of the left sacral alla.  There is involvement of the lateral L5 vertebral body and left L5 transverse process.  This is unchanged.  Tumor in the left psoas muscle also has improved.  He also had a CT angiogram of the abdomen pelvis.  This showed decrease of the left paraspinal mass.  Now measures 5.6 x 2.6 cm.  There is still complete occlusion of the left iliofemoral stent with extension of the thrombus into the infrarenal inferior vena cava.  I would have to think that he did have a good response to radiation chemotherapy.  Hopefully, the bony invasion is more reactionary.  He is eating well.  He is doing okay on the Lovenox.  There is still pain in the left leg.  I suspect this is going to be chronic.  I am sure this is from the sciatic nerve being encased.  He is on Duragesic patch at 50 mcg.  This seems to be keeping his pain at a decent level.  He has had no problems with cough or shortness of breath.  He has had no issues with bowels or bladder.  He has had no bleeding.  He has had no nausea or vomiting.  Overall, I would  say his performance status is ECOG 1.  Medications:  Current Outpatient Medications:  .  ASPIRIN ADULT LOW STRENGTH 81 MG EC tablet, TAKE 1 TABLET BY MOUTH EVERY DAY (Patient taking differently: Take 81 mg by mouth daily. ), Disp: 30 tablet, Rfl: 11 .  enoxaparin (LOVENOX) 80 MG/0.8ML injection, Inject 0.8 mLs (80 mg total) into the skin every 12 (twelve) hours., Disp: 19.2 mL, Rfl: 6 .  fentaNYL (DURAGESIC) 50 MCG/HR, Place 1 patch onto the skin every 3 (three) days., Disp: 10 patch, Rfl: 0 .  gabapentin (NEURONTIN) 600 MG tablet, Take 1 tablet (600 mg total) by mouth in the morning, at noon, in the evening, and at bedtime., Disp: 120 tablet, Rfl: 3 .  LORazepam (ATIVAN) 0.5 MG tablet, Take 1 tablet (0.5 mg total) by mouth every 6 (six) hours as needed for anxiety., Disp: 90 tablet, Rfl: 1 .  meloxicam (MOBIC) 15 MG tablet, Take 1 tablet (15 mg total) by mouth daily., Disp: 30 tablet, Rfl: 3 .  Oxycodone HCl 10 MG TABS, TAKE 1 TABLET (10 MG TOTAL) BY MOUTH EVERY 4 (FOUR) HOURS AS NEEDED., Disp: 90 tablet, Rfl: 0 .  zolpidem (AMBIEN) 10 MG tablet, TAKE 1 TABLET (10 MG TOTAL) BY MOUTH AT BEDTIME AS NEEDED FOR SLEEP., Disp: 30 tablet, Rfl: 0  Allergies: No  Known Allergies  Past Medical History, Surgical history, Social history, and Family History were reviewed and updated.  Review of Systems: Review of Systems  Constitutional: Negative.   HENT:  Negative.   Eyes: Negative.   Respiratory: Negative.   Cardiovascular: Positive for leg swelling.  Gastrointestinal: Negative.   Endocrine: Negative.   Musculoskeletal: Positive for flank pain.  Skin: Negative.   Neurological: Negative.   Hematological: Negative.   Psychiatric/Behavioral: Negative.     Physical Exam:  weight is 163 lb (73.9 kg). His oral temperature is 98.3 F (36.8 C). His blood pressure is 114/76 and his pulse is 83. His respiration is 18 and oxygen saturation is 97%.   Wt Readings from Last 3 Encounters:  05/20/20 163  lb (73.9 kg)  05/18/20 165 lb 4 oz (75 kg)  04/08/20 176 lb (79.8 kg)    Physical Exam Vitals reviewed.  HENT:     Head: Normocephalic and atraumatic.  Eyes:     Pupils: Pupils are equal, round, and reactive to light.  Cardiovascular:     Rate and Rhythm: Normal rate and regular rhythm.     Heart sounds: Normal heart sounds.  Pulmonary:     Effort: Pulmonary effort is normal.     Breath sounds: Normal breath sounds.  Abdominal:     General: Bowel sounds are normal.     Palpations: Abdomen is soft.  Musculoskeletal:        General: No tenderness or deformity. Normal range of motion.     Cervical back: Normal range of motion.     Comments: Extremities shows significant swelling of the left leg.  This is mostly in the thigh.  His left lower leg has decreased swelling.  No palpable venous cord is noted.  Right leg is unremarkable.  Lymphadenopathy:     Cervical: No cervical adenopathy.  Skin:    General: Skin is warm and dry.     Findings: No erythema or rash.  Neurological:     Mental Status: He is alert and oriented to person, place, and time.  Psychiatric:        Behavior: Behavior normal.        Thought Content: Thought content normal.        Judgment: Judgment normal.    Lab Results  Component Value Date   WBC 5.1 05/20/2020   HGB 11.6 (L) 05/20/2020   HCT 35.6 (L) 05/20/2020   MCV 100.8 (H) 05/20/2020   PLT 287 05/20/2020     Chemistry      Component Value Date/Time   NA 139 05/20/2020 0807   K 3.9 05/20/2020 0807   CL 104 05/20/2020 0807   CO2 29 05/20/2020 0807   BUN 16 05/20/2020 0807   CREATININE 0.89 05/20/2020 0807      Component Value Date/Time   CALCIUM 10.2 05/20/2020 0807   ALKPHOS 42 05/20/2020 0807   AST 15 05/20/2020 0807   ALT 15 05/20/2020 0807   BILITOT 0.3 05/20/2020 0807       Impression and Plan: [name] is a really nice 64 year old white male.  He has a very interesting problem.  He had in situ bladder cancer about 5 years  ago.  Now, he has a recurrence that I would consider metastatic.  This is outside of the bladder.  It is not operable as it is appear to be invading to the psoas muscle and also involving his lower spine.  He has had a nice response to radiation and  chemotherapy.  His quality life is better.  His pain is less.  He is more functional.  I think we have to go now with chemotherapy.  Since he is already received cisplatin, I think he would be a good candidate for dose dense MVAC.  Again he is in good shape.  He has good renal function.  I think with the use of colony-stimulating factors, we hopefully will be able to get a even better response.  He will need to have a Port-A-Cath.  We will have to see about getting this placed and try to get treatment started next week.  I would like to get a PET scan on him.  This may give [place] better sense as to what is actually going on down in the pelvis.  He will continue the Lovenox.  Hopefully, interventional radiology might be able to help this thrombus in the left leg.  Maybe they can use one of their tools to pull the thrombus out to improve blood flow.  The leg currently does not look as swollen.  The pain concerning could be coming from the sciatic nerve and not from the thrombus itself.  His tumor does have a decent PD-L1 level.  There is a high TMB.  As such, I would utilize maintenance immunotherapy.  I probably would give 6 cycles of dose dense MVAC.  I would recheck a MRI probably after the third cycle.  He will need to have an echocardiogram set up.  I cannot imagine that there is any cardiac issues that we would have.  He really has done a great job.  He has an everything we have asked him to do.  He has great support at home from his family.  His daughter was on the phone from [place].  Again we will try to get started with treatment next week or at worst, the week of Christmas.  I will plan to see him back when he has a second cycle of dose dense  MVAC.     Volanda Napoleon, MD 12/9/20217:12 AM

## 2020-05-21 NOTE — Progress Notes (Signed)
Called patient and reviewed upcoming appointments with him. He stated he would also review appointments on MyChart and let us know if he has any questions.   Patient also had questions about his new chemo regimen. I offered for him to be scheduled for a chemotherapy education class, in person or over the phone and he declined. He requested that I send his new regimen to him via MyChart so he could read up on it. Message sent. He will follow up with any questions or concerns.   Oncology Nurse Navigator Documentation  Oncology Nurse Navigator Flowsheets 05/21/2020  Abnormal Finding Date -  Confirmed Diagnosis Date -  Diagnosis Status -  Planned Course of Treatment -  Phase of Treatment Chemo  Chemotherapy Pending- Reason: Oncologist Choice  Chemo/Radiation Concurrent Actual Start Date: -  Navigator Follow Up Date: 05/29/2020  Navigator Follow Up Reason: Scan Review  Navigator Location [facility]  Referral Date to RadOnc/MedOnc -  Navigator Encounter Type Appt/Treatment Plan Review;Telephone;MyChart  Telephone Appt Confirmation/Clarification;Education;Outgoing Call  Treatment Initiated Date -  Patient Visit Type MedOnc  Treatment Phase Active Tx  Barriers/Navigation Needs Coordination of Care;Education  Education Preparing for Upcoming Surgery/ Treatment  Interventions Coordination of Care;Education;Psycho-Social Support  Acuity Level 2-Minimal Needs (1-2 Barriers Identified)  Referrals -  Coordination of Care Appts  Education Method Written  Support Groups/Services Friends and Family  Time Spent with Patient 65

## 2020-05-21 NOTE — Telephone Encounter (Signed)
Per 05/20/20 los and secure chat message the pt has been called with all appts.  Pt aware we are still working on the injection portion of is tx and he will review mychart and call with any questions.... AOM

## 2020-05-26 ENCOUNTER — Other Ambulatory Visit: Payer: 59

## 2020-05-26 MED FILL — MELOXICAM 15 MG TABLET: 15 | 30 days supply | Qty: 30 | Fill #2

## 2020-05-26 MED FILL — ENOXAPARIN SODIUM 80 MG/0.8: 80 | 12 days supply | Qty: 19 | Fill #0

## 2020-05-26 NOTE — Progress Notes (Signed)
Pharmacist Chemotherapy Monitoring - Initial Assessment    Anticipated start date: 06/02/20  Regimen:  . Are orders appropriate based on the patient's diagnosis, regimen, and cycle? Yes . Does the plan date match the patient's scheduled date? Yes . Is the sequencing of drugs appropriate? Yes . Are the premedications appropriate for the patient's regimen? Yes . Prior Authorization for treatment is: Pending o If applicable, is the correct biosimilar selected based on the patient's insurance? not applicable  Organ Function and Labs: Marland Kitchen Are dose adjustments needed based on the patient's renal function, hepatic function, or hematologic function? Yes . Are appropriate labs ordered prior to the start of patient's treatment? Yes . Other organ system assessment, if indicated: anthracyclines: Echo/ MUGA . The following baseline labs, if indicated, have been ordered: N/A  Dose Assessment: . Are the drug doses appropriate? Yes . Are the following correct: o Drug concentrations Yes o IV fluid compatible with drug Yes o Administration routes Yes o Timing of therapy Yes . If applicable, does the patient have documented access for treatment and/or plans for port-a-cath placement? yes . If applicable, have lifetime cumulative doses been properly documented and assessed? yes Lifetime Dose Tracking  No doses have been documented on this patient for the following tracked chemicals: Doxorubicin, Epirubicin, Idarubicin, Daunorubicin, Mitoxantrone, Bleomycin, Oxaliplatin, Carboplatin, Liposomal Doxorubicin  o   Toxicity Monitoring/Prevention: . The patient has the following take home antiemetics prescribed: Prochlorperazine . The patient has the following take home medications prescribed: N/A . Medication allergies and previous infusion related reactions, if applicable, have been reviewed and addressed. Yes . The patient's current medication list has been assessed for drug-drug interactions with their  chemotherapy regimen. no significant drug-drug interactions were identified on review.  Order Review: . Are the treatment plan orders signed? No . Is the patient scheduled to see a provider prior to their treatment? No  I verify that I have reviewed each item in the above checklist and answered each question accordingly.  Philomena Course 05/26/2020 11:09 AM

## 2020-05-27 ENCOUNTER — Other Ambulatory Visit: Payer: Self-pay

## 2020-05-27 ENCOUNTER — Ambulatory Visit
Admission: RE | Admit: 2020-05-27 | Discharge: 2020-05-27 | Disposition: A | Discharge status: Home / Self Care | Payer: 59 | Source: Ambulatory Visit | Attending: Interventional Radiology | Admitting: Interventional Radiology

## 2020-05-27 ENCOUNTER — Encounter: Payer: Self-pay | Admitting: *Deleted

## 2020-05-27 DIAGNOSIS — M7989 Other specified soft tissue disorders: Secondary | ICD-10-CM

## 2020-05-27 HISTORY — PX: IR RADIOLOGIST EVAL & MGMT: IMG5224

## 2020-05-27 NOTE — Progress Notes (Signed)
Chief Complaint: Leg swelling, DVT  Referring Physician(s): [place],Sarah M  History of Present Illness: Brian Benton is a 64 y.o. male presenting today as a scheduled follow up to [facility] [place] [facility], to discuss his diagnostic testing, known left leg recurrent DVT, and post-thrombotic syndrome.   Today Brian Benton joins [place] with his wife for our visit via telemedicine/phone visit.  We confirmed his identity with 2 personal identifiers.   He confirms that he has ongoing symptoms in his left lower extremity, which are life-style limiting, mostly pain.  Our initial clinic visit was 04/30/20, with full consult note.  Essentially, Brian Benton continues to have ongoing symptoms, previously calculated on Villalta scale in the severe category.   Since our last visit, we have performed a duplex of the left leg, confirming mix of sub-acute and chronic DVT changes of the iliac and femoral system, as well as CTA of the abd/pelvis, which demonstrates left iliac vein/stent occlusion, non-occlusive IVC thrombus, and improving appearance of the left pelvic mass/malignancy.   He continues to wear compression stockings daily, removing them at night.   He continues to take lovenox and aspirin daily  Past Medical History:  Diagnosis Date  . Cancer ([place])    bladder  . History of bladder cancer    s/p  TURBT 07-25-2014  . Hyperlipidemia   . Hypertension   . Mass of left hand   . 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 BLADDER SURGERY     [place]  . COLONOSCOPY    . CYSTECTOMY    . CYSTOSCOPY WITH BIOPSY N/A 07/25/2014   Procedure: CYSTOSCOPY WITH BLADDER BIOPSY AND FULGERATION;  Surgeon: [name], MD;  Location: [facility];  Service: Urology;  Laterality: N/A;  . CYSTOSCOPY WITH BIOPSY N/A 11/28/2014   Procedure: CYSTOSCOPY WITH  BLADDER BIOPSY;  Surgeon: [name], MD;  Location: [facility];  Service: Urology;  Laterality: N/A;  . HEMORRHOID SURGERY  03/15/2012   Procedure: HEMORRHOIDECTOMY;  Surgeon: [name], MD;  Location: [facility];  Service: General;  Laterality: N/A;  . HERNIA REPAIR    . INGUINAL HERNIA REPAIR Bilateral 2006  . INTRAVASCULAR ULTRASOUND/IVUS N/A 03/13/2020   Procedure: Intravascular Ultrasound/IVUS;  Surgeon: Elam Dutch, MD;  Location: [place] [facility];  Service: Cardiovascular;  Laterality: N/A;  . IR RADIOLOGIST EVAL & MGMT  04/30/2020  . IR RADIOLOGIST EVAL & MGMT  05/27/2020  . IVC VENOGRAPHY N/A 03/13/2020   Procedure: IVC Venography;  Surgeon: Elam Dutch, MD;  Location: [place] [facility];  Service: Cardiovascular;  Laterality: N/A;  . LOWER EXTREMITY VENOGRAPHY Left 03/13/2020   Procedure: LOWER EXTREMITY VENOGRAPHY;  Surgeon: Elam Dutch, MD;  Location: [place] [facility];  Service: Cardiovascular;  Laterality: Left;  Marland Kitchen MASS EXCISION Left 11/12/2018   Procedure: EXCISION MASS LEFT HAND;  Surgeon: Daryll Brod, MD;  Location: [place];  Service: Orthopedics;  Laterality: Left;  FAB  . PERIPHERAL VASCULAR INTERVENTION  03/13/2020   Procedure: PERIPHERAL VASCULAR INTERVENTION;  Surgeon: Elam Dutch, MD;  Location: [place] [facility];  Service: Cardiovascular;;  . PERIPHERAL VASCULAR THROMBECTOMY N/A 03/13/2020   Procedure: PERIPHERAL VASCULAR THROMBECTOMY;  Surgeon: Elam Dutch, MD;  Location: [place] [facility];  Service: Cardiovascular;  Laterality: N/A;  . RADIOLOGY WITH ANESTHESIA N/A 02/20/2020   Procedure: MRI LUMBAR W/O CONTRAST  WITH  ANESTHESIA;  Surgeon: Radiologist, Medication, MD;  Location: [place] [facility];  Service: Radiology;  Laterality: N/A;  . TESTICLE SURGERY  2004   Ruptured Undescended Right testicle   . TRANSURETHRAL RESECTION OF BLADDER TUMOR WITH GYRUS (TURBT-GYRUS) N/A 05/23/2014   Procedure: TRANSURETHRAL RESECTION OF BLADDER TUMOR WITH GYRUS (TURBT-GYRUS);  Surgeon: [name], MD;  Location[facility];  Service: Urology;  Laterality: N/A;  . WISDOM TOOTH EXTRACTION      Allergies: Patient has no known allergies.  Medications: Prior to Admission medications   Medication Sig Start Date End Date Taking? Authorizing Provider  ASPIRIN ADULT LOW STRENGTH 81 MG EC tablet TAKE 1 TABLET BY MOUTH EVERY DAY Patient taking differently: Take 81 mg by mouth daily.  10/03/19   Patwardhan, Manish J, MD  enoxaparin (LOVENOX) 80 MG/0.8ML injection Inject 0.8 mLs (80 mg total) into the skin every 12 (twelve) hours. 05/20/20   Volanda Napoleon, MD  fentaNYL (DURAGESIC) 50 MCG/HR Place 1 patch onto the skin every 3 (three) days. 05/04/20   Volanda Napoleon, MD  gabapentin (NEURONTIN) 600 MG tablet Take 1 tablet (600 mg total) by mouth in the morning, at noon, in the evening, and at bedtime. 04/13/20   Volanda Napoleon, MD  LORazepam (ATIVAN) 0.5 MG tablet Take 1 tablet (0.5 mg total) by mouth every 6 (six) hours as needed for anxiety. 05/20/20   Volanda Napoleon, MD  meloxicam (MOBIC) 15 MG tablet Take 1 tablet (15 mg total) by mouth daily. 03/20/20   Volanda Napoleon, MD  Oxycodone HCl 10 MG TABS TAKE 1 TABLET (10 MG TOTAL) BY MOUTH EVERY 4 (FOUR) HOURS AS NEEDED. 05/01/20   Volanda Napoleon, MD  zolpidem (AMBIEN) 10 MG tablet TAKE 1 TABLET (10 MG TOTAL) BY MOUTH AT BEDTIME AS NEEDED FOR SLEEP. 05/01/20 05/31/20  Volanda Napoleon, MD     Family History  Problem Relation Age of Onset  . Cancer Mother        leukemia  . Diabetes Brother     Social History   Socioeconomic History  . Marital status: Married    Spouse name: Not on file  . Number of children: 2  . Years of education: Not on file  . Highest education level: Not on file  Occupational History  . Not on file  Tobacco Use  . Smoking status: Light Tobacco Smoker    Packs/day: 0.50    Years: 30.00    Pack years: 15.00    Types: Cigarettes    Last attempt to quit: 01/01/2019    Years since  quitting: 1.4  . Smokeless tobacco: Never Used  Vaping Use  . Vaping Use: Never used  Substance and Sexual Activity  . Alcohol use: Not Currently    Comment: social  . Drug use: No  . Sexual activity: Not on file  Other Topics Concern  . Not on file  Social History Narrative  . Not on file   Social Determinants of Health   Financial Resource Strain: Not on file  Food Insecurity: Not on file  Transportation Needs: Not on file  Physical Activity: Not on file  Stress: Not on file  Social Connections: Not on file    ECOG Status: 1 - Symptomatic but completely ambulatory  Review of Systems  Review of Systems: A 12 point ROS discussed and pertinent positives are indicated in the HPI above.  All other systems are negative.  Physical Exam No direct physical exam  was performed (except for noted visual exam findings with Video Visits).    Vital Signs: There were no vitals taken for this visit.  Imaging: Brian Lumbar Spine W Wo Contrast  Result Date: 05/15/2020 CLINICAL DATA:  Metastatic bladder cell carcinoma. Evaluate response to chemo and radiation. EXAM: MRI LUMBAR SPINE WITHOUT AND WITH CONTRAST TECHNIQUE: Multiplanar and multiecho pulse sequences of the lumbar spine were obtained without and with intravenous contrast. CONTRAST:  74mL GADAVIST GADOBUTROL 1 MMOL/ML IV SOLN COMPARISON:  Lumbar MRI 02/20/2020 FINDINGS: Segmentation:  Normal Alignment:  Mild retrolisthesis L2-3 and L3-4 Vertebrae: Progressive tumor invasion of the left sacrum. Mild involvement of the lateral L5 vertebral body and left L5 transverse process appears unchanged. No new bony involvement. No fracture. Conus medullaris and cauda equina: Conus extends to the L2 level. Conus and cauda equina appear normal. Paraspinal and other soft tissues: Left iliac lymph node mass appears smaller. There is more cystic change and decrease in size. This encases the left sciatic nerve. Adjacent bony invasion of the left sacral ala  has progressed. Involvement of the left iliacus muscle unchanged. Tumor in the left psoas muscle improved in the interval. Disc levels: L1-2: Negative L2-3: Mild retrolisthesis. Mild disc and facet degeneration. Mild subarticular stenosis bilaterally L3-4: Mild disc and mild facet degeneration. Mild subarticular stenosis bilaterally L4-5: Disc degeneration with disc bulging. Shallow left foraminal disc protrusion with annular fissure unchanged from the prior study bilateral facet degeneration and mild subarticular stenosis bilaterally L5-S1: Negative IMPRESSION: Metastatic lymph node mass in the left iliac lymph nodes has decreased in size. Decreased involvement of the left psoas muscle. No change in involvement of the left iliacus muscle Bony involvement involving the left sacrum has progressed. No change in minor involvement of the left lateral L5 vertebral body and transverse process. No new metastatic deposits. Electronically Signed   By: Franchot Gallo M.D.   On: 05/15/2020 14:01   US Venous Img Lower Unilateral Left (DVT)  Result Date: 05/12/2020 CLINICAL DATA:  64 year old male with a history of left-sided DVT, history of malignancy of the left pelvis EXAM: LEFT LOWER EXTREMITY VENOUS DOPPLER ULTRASOUND TECHNIQUE: Gray-scale sonography with graded compression, as well as color Doppler and duplex ultrasound were performed to evaluate the lower extremity deep venous systems from the level of the common femoral vein and including the common femoral, femoral, profunda femoral, popliteal and calf veins including the posterior tibial, peroneal and gastrocnemius veins when visible. The superficial great saphenous vein was also interrogated. Spectral Doppler was utilized to evaluate flow at rest and with distal augmentation maneuvers in the common femoral, femoral and popliteal veins. COMPARISON:  None. FINDINGS: Contralateral Common Femoral Vein: No evidence of thrombus. Normal compressibility, respiratory  phasicity and response to augmentation. Common Femoral Vein: Stent changes of the left common femoral vein, with no significant flow and incompressible vein. Saphenofemoral Junction: Partially compressible junction, with flow maintained in the saphenous vein. Profunda Femoral Vein: Flow maintained within the profunda, with partially compressible junction. Femoral Vein: Partially compressible femoral vein with minimal flow maintained. Popliteal Vein: Popliteal vein partially compressible with flow maintained. Calf Veins: Posterior tibial veins with flow maintained. Peroneal veins with flow maintained. Superficial Great Saphenous Vein: No evidence of thrombus. Normal compressibility and flow on color Doppler imaging. Other Findings:  None. IMPRESSION: Sonographic survey of the left lower extremity demonstrates stent changes of the common femoral vein, with persisting DVT of the left common femoral vein stent, occlusive. Positive duplex for DVT at the SFJ, junction  of the profunda vein, and through the femoral vein segment. Electronically Signed   By: Corrie Mckusick D.O.   On: 05/12/2020 12:58   IR Radiologist Eval & Mgmt  Result Date: 05/27/2020 Please refer to notes tab for details about interventional procedure. (Op Note)  IR Radiologist Eval & Mgmt  Result Date: 04/30/2020 Please refer to notes tab for details about interventional procedure. (Op Note)  CT Angio Abd/Pel w/ and/or w/o  Result Date: 05/18/2020 CLINICAL DATA:  64 year old male with history of left lower extremity deep vein thrombosis secondary to extrinsic compression from metastatic pelvic malignancy presenting with post thrombotic syndrome after left ileo femoral thrombectomy and stent placement. EXAM: CTA ABDOMEN AND PELVIS WITH CONTRAST TECHNIQUE: Multidetector CT imaging of the abdomen and pelvis was performed using the standard protocol during and after bolus administration of intravenous contrast. Multiplanar reconstructed images  and MIPs were obtained and reviewed to evaluate the vascular anatomy. CONTRAST:  147mL OMNIPAQUE IOHEXOL 350 MG/ML SOLN COMPARISON:  03/02/2020, 03/20/2019 FINDINGS: VASCULAR IVC: The suprarenal inferior vena cava is patent and normal in caliber. Approximately 4.5 cm inferior to the level of the left renal vein is nonocclusive thrombus extending from the left iliac vein into the inferior vena cava, eccentricly favoring the left side of the IVC. Renal veins: Widely patent. Right iliofemoral veins: Normal caliber and patent throughout. Left iliofemoral veins: Indwelling stents throughout the left common iliac, external iliac, and common femoral vein terminating just central to the saphenofemoral junction. This stents are occluded throughout. In the proximal external iliac vein, there is a free-floating wall of the indwelling stent, best visualized on sagittal series 4, images 99-103. The central superficial and profunda femoral veins appear patent, slightly enlarged compared to the right, likely secondary to congestion. There are no appreciable major intrapelvic or ascending lumbar collateral veins appreciated. Portal system: Widely patent common normal caliber. Hepatic veins: Widely patent. Arterial system: Scattered atherosclerotic calcifications throughout the abdominal aorta, most prominent in the infrarenal portion with mild fusiform ectasia of the infrarenal abdominal aorta measuring up to 2.6 cm. The inflow proximal outflow vessels appear patent. No evidence of visceral ostial occlusion. Review of the MIP images confirms the above findings. NON-VASCULAR Lower chest: Similar appearing linear atelectasis in the bilateral lower lobes. Moderate centrilobular emphysema. The heart is normal in size. No pericardial effusion. Hepatobiliary: No focal liver abnormality is seen. No gallstones, gallbladder wall thickening, or biliary dilatation. Pancreas: Unremarkable. No pancreatic ductal dilatation or surrounding  inflammatory changes. Spleen: Normal in size without focal abnormality. Adrenals/Urinary Tract: Adrenal glands are unremarkable. Simple appearing multifocal simple renal cysts, the largest about the left inferior pole, partially exophytic medially, measuring up to 2.5 cm. Kidneys are otherwise normal, without renal calculi, focal lesion, or hydronephrosis. Bladder is nondistended. Stomach/Bowel: Stomach is within normal limits. Appendix appears normal. Large stool burden. No evidence of bowel wall thickening, distention, or inflammatory changes. Lymphatic: No abdominopelvic adenopathy. Reproductive: Prostate is unremarkable. Other: Continued decreased size of previously visualized left lower paraspinal mass now measuring up to approximately 5.6 x 2.6 cm in maximum axial dimension, approximately 6.7 x 3.4 cm at the same location on 03/19/2020 PET-CT by my measurements. There is similar appearing associated cortical destruction of the superior, anterior sacral ala at this level. No ascites. Musculoskeletal: No acute abnormality. IMPRESSION: VASCULAR Complete occlusion of the indwelling left iliofemoral venous stent with extension of nonocclusive thrombus into the infrarenal inferior vena cava. Aortic Atherosclerosis (ICD10-I70.0). NON-VASCULAR Suggestion favorable treatment response with continued decreased size of  the previously visualized left lower paraspinal mass. Unchanged associated osseous destruction of the anterior aspect superior left sacral ala. Ruthann Cancer, MD [facility] Electronically Signed   By: Ruthann Cancer MD   On: 05/18/2020 09:42    Labs:  CBC: Recent Labs    03/25/20 0941 04/01/20 0937 04/08/20 0846 05/20/20 0807  WBC 7.8 6.5 5.2 5.1  HGB 8.9* 9.7* 10.0* 11.6*  HCT 27.5* 29.2* 30.2* 35.6*  PLT 246 209 177 287    COAGS: Recent Labs    03/02/20 0105  INR 1.0    BMP: Recent Labs    03/02/20 0105 03/03/20 0733  03/04/20 0337 03/11/20 0813 03/18/20 0858 03/25/20 0941 04/01/20 0937 04/08/20 0846 05/18/20 0835 05/20/20 0807  NA 135 134* 134* 134*   < > 134* 135 135  --  139  K 5.1 4.8 4.1 4.8   < > 4.3 4.2 4.4  --  3.9  CL 98 98 100 98   < > 101 102 103  --  104  CO2 28 25 26  32   < > 26 27 27   --  29  GLUCOSE 119* 121* 131* 94   < > 99 102* 96  --  117*  BUN 21 18 13 14    < > 18 14 12   --  16  CALCIUM 9.3 9.3 8.7* 10.2   < > 9.5 9.5 9.3  --  10.2  CREATININE 0.94 0.74 0.69 0.72   < > 0.76 0.61 0.66 0.80 0.89  GFRNONAA >60 >60 >60 >60   < > >60 >60 >60  --  >60  GFRAA >60 >60 >60 >60  --   --   --   --   --   --    < > = values in this interval not displayed.    LIVER FUNCTION TESTS: Recent Labs    03/25/20 0941 04/01/20 0937 04/08/20 0846 05/20/20 0807  BILITOT 0.4 0.3 0.4 0.3  AST 11* 12* 11* 15  ALT 18 16 14 15   ALKPHOS 74 82 77 42  PROT 6.7 7.0 6.8 7.0  ALBUMIN 3.6 3.7 3.3* 4.1    TUMOR MARKERS: No results for input(s): AFPTM, CEA, CA199, CHROMGRNA in the last 8760 hours.  Assessment and Plan:  Brian Benton is a 64 yo male with history of life-style limiting left lower extremity post-thrombotic syndrome, severe Villalta category, secondary to left iliac vein/stent thrombosis.   After our diagnostics and prior consultation, we have again discussed the indication for venogram and IVUS with possible treatment, with treatment to potentially include mechanical thrombectomy/lysis, pharmacologic thrombectomy/lysis, possible PTA, possible stenting, possible IVC filter placement.    Our goals of therapy, as I discussed with Brian Benton and his wife, will be for debulking the thrombus and restoring flow through the iliac vein, for decreased symptoms of post-thrombotic syndrome, restore function, and improved quality of life.    Risks discussed include: bleeding, infection, need for additional surgery/procedure, renal/organ dysfunction, internal hemorrhage, contrast reaction, ongoing  symptoms, embolism, cardiopulmonary collapse, death.    After our discussion, he is highly motivated to pursue treatment.    Plan: - plan is to proceed with left lower extremity venogram with possible intervention, that might include temporary IVC filter, possible venous thrombectomy/lysis, possible PTA/stenting - plan for general anesthesia for comfort/safety, on day with 2 VIR available: Dr. Earleen Newport and Dr. Serafina Royals - continue anti-platelets and BID lovenox right up to the day of treatment - continue conservative  measures, including compression stockings.     Electronically Signed: Corrie Mckusick 05/27/2020, 11:52 AM   I spent a total of    25 Minutes in remote  clinical consultation, greater than 50% of which was counseling/coordinating care for recurrent left leg DVT, possible venogram and treatment for post-thrombotic syndrome.    Visit type: Audio only (telephone). Audio (no video) only due to patient's lack of internet/smartphone capability. Alternative for in-person consultation at [facility], [address] [place], [place], [place]. This visit type was conducted due to national recommendations for restrictions regarding the COVID-19 Pandemic (e.g. social distancing).  This format is felt to be most appropriate for this patient at this time.  All issues noted in this document were discussed and addressed.

## 2020-05-28 ENCOUNTER — Other Ambulatory Visit: Payer: Self-pay | Admitting: Radiology

## 2020-05-28 ENCOUNTER — Encounter: Payer: Self-pay | Admitting: *Deleted

## 2020-05-28 ENCOUNTER — Ambulatory Visit (HOSPITAL_COMMUNITY)
Admission: RE | Admit: 2020-05-28 | Discharge: 2020-05-28 | Disposition: A | Discharge status: Home / Self Care | Payer: 59 | Source: Ambulatory Visit | Attending: Hematology & Oncology | Admitting: Hematology & Oncology

## 2020-05-28 ENCOUNTER — Other Ambulatory Visit: Payer: Self-pay | Admitting: Hematology & Oncology

## 2020-05-28 ENCOUNTER — Other Ambulatory Visit: Payer: Self-pay

## 2020-05-28 DIAGNOSIS — E785 Hyperlipidemia, unspecified: Secondary | ICD-10-CM | POA: Insufficient documentation

## 2020-05-28 DIAGNOSIS — F17210 Nicotine dependence, cigarettes, uncomplicated: Secondary | ICD-10-CM | POA: Diagnosis not present

## 2020-05-28 DIAGNOSIS — Z0189 Encounter for other specified special examinations: Secondary | ICD-10-CM | POA: Diagnosis not present

## 2020-05-28 DIAGNOSIS — C679 Malignant neoplasm of bladder, unspecified: Secondary | ICD-10-CM

## 2020-05-28 DIAGNOSIS — I10 Essential (primary) hypertension: Secondary | ICD-10-CM | POA: Insufficient documentation

## 2020-05-28 DIAGNOSIS — C799 Secondary malignant neoplasm of unspecified site: Secondary | ICD-10-CM | POA: Diagnosis not present

## 2020-05-28 LAB — ECHOCARDIOGRAM COMPLETE
Area-P 1/2: 2.99 cm2
Calc EF: 55.1 %
S' Lateral: 3.2 cm
Single Plane A2C EF: 58.5 %
Single Plane A4C EF: 53.2 %

## 2020-05-28 NOTE — Progress Notes (Signed)
  Echocardiogram 2D Echocardiogram has been performed.  Brian Benton 05/28/2020, 9:55 AM

## 2020-05-28 NOTE — Progress Notes (Signed)
Brian Benton is non-preferred per [employer]. Brian Benton is preferred. Fulphila orders dc'd and Neulasta orders entered.  Hardie Pulley, PharmD, BCPS, BCOP

## 2020-05-29 ENCOUNTER — Telehealth: Payer: Self-pay | Admitting: *Deleted

## 2020-05-29 ENCOUNTER — Ambulatory Visit (HOSPITAL_COMMUNITY)
Admission: RE | Admit: 2020-05-29 | Discharge: 2020-05-29 | Disposition: A | Discharge status: Home / Self Care | Payer: 59 | Source: Ambulatory Visit | Attending: Hematology & Oncology | Admitting: Hematology & Oncology

## 2020-05-29 DIAGNOSIS — C679 Malignant neoplasm of bladder, unspecified: Secondary | ICD-10-CM

## 2020-05-29 DIAGNOSIS — C799 Secondary malignant neoplasm of unspecified site: Secondary | ICD-10-CM | POA: Diagnosis not present

## 2020-05-29 LAB — GLUCOSE, CAPILLARY: Glucose-Capillary: 98 mg/dL (ref 70–99)

## 2020-05-29 MED ORDER — FLUDEOXYGLUCOSE F - 18 (FDG) INJECTION
8.1000 | Freq: Once | INTRAVENOUS | Status: AC
  Administered 2020-05-29: 15:00:00 8.1 via INTRAVENOUS

## 2020-05-29 NOTE — Telephone Encounter (Signed)
Medical records at [facility] requested office notes from 02/12/2020 - 05/29/2020. Fax # is [phone]. I routed the office notes through [facility].

## 2020-06-01 ENCOUNTER — Ambulatory Visit (HOSPITAL_COMMUNITY)
Admission: RE | Admit: 2020-06-01 | Discharge: 2020-06-01 | Disposition: A | Discharge status: Home / Self Care | Payer: 59 | Source: Ambulatory Visit | Attending: Hematology & Oncology | Admitting: Hematology & Oncology

## 2020-06-01 ENCOUNTER — Other Ambulatory Visit: Payer: Self-pay | Admitting: Hematology & Oncology

## 2020-06-01 ENCOUNTER — Encounter: Payer: Self-pay | Admitting: *Deleted

## 2020-06-01 ENCOUNTER — Other Ambulatory Visit: Payer: Self-pay | Admitting: *Deleted

## 2020-06-01 ENCOUNTER — Encounter (HOSPITAL_COMMUNITY): Payer: Self-pay

## 2020-06-01 DIAGNOSIS — C679 Malignant neoplasm of bladder, unspecified: Secondary | ICD-10-CM

## 2020-06-01 DIAGNOSIS — R222 Localized swelling, mass and lump, trunk: Secondary | ICD-10-CM

## 2020-06-01 DIAGNOSIS — C799 Secondary malignant neoplasm of unspecified site: Secondary | ICD-10-CM

## 2020-06-01 HISTORY — PX: IR IMAGING GUIDED PORT INSERTION: IMG5740

## 2020-06-01 LAB — CBC WITH DIFFERENTIAL/PLATELET
Abs Immature Granulocytes: 0.01 10*3/uL (ref 0.00–0.07)
Basophils Absolute: 0.1 10*3/uL (ref 0.0–0.1)
Basophils Relative: 1 %
Eosinophils Absolute: 0.3 10*3/uL (ref 0.0–0.5)
Eosinophils Relative: 6 %
HCT: 36.2 % — ABNORMAL LOW (ref 39.0–52.0)
Hemoglobin: 11.6 g/dL — ABNORMAL LOW (ref 13.0–17.0)
Immature Granulocytes: 0 %
Lymphocytes Relative: 15 %
Lymphs Abs: 0.7 10*3/uL (ref 0.7–4.0)
MCH: 33 pg (ref 26.0–34.0)
MCHC: 32 g/dL (ref 30.0–36.0)
MCV: 103.1 fL — ABNORMAL HIGH (ref 80.0–100.0)
Monocytes Absolute: 0.6 10*3/uL (ref 0.1–1.0)
Monocytes Relative: 12 %
Neutro Abs: 3.2 10*3/uL (ref 1.7–7.7)
Neutrophils Relative %: 66 %
Platelets: 190 10*3/uL (ref 150–400)
RBC: 3.51 MIL/uL — ABNORMAL LOW (ref 4.22–5.81)
RDW: 17.7 % — ABNORMAL HIGH (ref 11.5–15.5)
WBC: 4.9 10*3/uL (ref 4.0–10.5)
nRBC: 0 % (ref 0.0–0.2)

## 2020-06-01 LAB — PROTIME-INR
INR: 1.1 (ref 0.8–1.2)
Prothrombin Time: 13.3 seconds (ref 11.4–15.2)

## 2020-06-01 MED ORDER — MIDAZOLAM HCL 2 MG/2ML IJ SOLN
INTRAMUSCULAR | Status: AC | PRN
  Administered 2020-06-01 (×2): 1 mg via INTRAVENOUS

## 2020-06-01 MED ORDER — HEPARIN SOD (PORK) LOCK FLUSH 100 UNIT/ML IV SOLN
INTRAVENOUS | Status: AC
  Filled 2020-06-01: qty 5

## 2020-06-01 MED ORDER — MIDAZOLAM HCL 2 MG/2ML IJ SOLN
INTRAMUSCULAR | Status: AC
  Filled 2020-06-01: qty 4

## 2020-06-01 MED ORDER — CEFAZOLIN SODIUM-DEXTROSE 2-4 GM/100ML-% IV SOLN
INTRAVENOUS | Status: AC
  Administered 2020-06-01: 14:00:00 2 g via INTRAVENOUS
  Filled 2020-06-01: qty 100

## 2020-06-01 MED ORDER — SODIUM CHLORIDE 0.9 % IV SOLN: INTRAVENOUS | Status: DC

## 2020-06-01 MED ORDER — FENTANYL CITRATE (PF) 100 MCG/2ML IJ SOLN
INTRAMUSCULAR | Status: AC | PRN
  Administered 2020-06-01 (×2): 50 ug via INTRAVENOUS

## 2020-06-01 MED ORDER — LIDOCAINE-EPINEPHRINE 1 %-1:100000 IJ SOLN
INTRAMUSCULAR | Status: AC | PRN
  Administered 2020-06-01 (×2): 10 mL via INTRADERMAL

## 2020-06-01 MED ORDER — HEPARIN SOD (PORK) LOCK FLUSH 100 UNIT/ML IV SOLN
INTRAVENOUS | Status: AC | PRN
  Administered 2020-06-01: 500 [IU] via INTRAVENOUS

## 2020-06-01 MED ORDER — FENTANYL CITRATE (PF) 100 MCG/2ML IJ SOLN
INTRAMUSCULAR | Status: AC
  Filled 2020-06-01: qty 2

## 2020-06-01 MED ORDER — CEFAZOLIN SODIUM-DEXTROSE 2-4 GM/100ML-% IV SOLN: 2.0000 g | INTRAVENOUS | Status: AC

## 2020-06-01 MED ORDER — LIDOCAINE-EPINEPHRINE 1 %-1:100000 IJ SOLN
INTRAMUSCULAR | Status: AC
  Filled 2020-06-01: qty 1

## 2020-06-01 MED FILL — fentaNYL 50 MCG/HR PT72: 50 | 30 days supply | Qty: 10 | Fill #0

## 2020-06-01 NOTE — Discharge Instructions (Signed)
For questions /concerns may call Interventional Radiology at [phone]  You may remove your dressing and shower tomorrow afternoon  DO NOT use EMLA cream for 2 weeks after port placement as the cream will remove surgical glue on your incision.    Implanted Port Insertion, Care After This sheet gives you information about how to care for yourself after your procedure. Your health care provider may also give you more specific instructions. If you have problems or questions, contact your health care provider. What can I expect after the procedure? After the procedure, it is common to have:  Discomfort at the port insertion site.  Bruising on the skin over the port. This should improve over 3-4 days. Follow these instructions at home: Port care  After your port is placed, you will get a manufacturer's information card. The card has information about your port. Keep this card with you at all times.  Take care of the port as told by your health care provider. Ask your health care provider if you or a family member can get training for taking care of the port at home. A home health care nurse may also take care of the port.  Make sure to remember what type of port you have. Incision care      Follow instructions from your health care provider about how to take care of your port insertion site. Make sure you: ? Wash your hands with soap and water before and after you change your bandage (dressing). If soap and water are not available, use hand sanitizer. ? Change your dressing as told by your health care provider. ? Leave stitches (sutures), skin glue, or adhesive strips in place. These skin closures may need to stay in place for 2 weeks or longer. If adhesive strip edges start to loosen and curl up, you may trim the loose edges. Do not remove adhesive strips completely unless your health care provider tells you to do that.  Check your port insertion site every day for signs of  infection. Check for: ? Redness, swelling, or pain. ? Fluid or blood. ? Warmth. ? Pus or a bad smell. Activity  Return to your normal activities as told by your health care provider. Ask your health care provider what activities are safe for you.  Do not lift anything that is heavier than 10 lb (4.5 kg), or the limit that you are told, until your health care provider says that it is safe. General instructions  Take over-the-counter and prescription medicines only as told by your health care provider.  Do not take baths, swim, or use a hot tub until your health care provider approves. Ask your health care provider if you may take showers. You may only be allowed to take sponge baths.  Do not drive for 24 hours if you were given a sedative during your procedure.  Wear a medical alert bracelet in case of an emergency. This will tell any health care providers that you have a port.  Keep all follow-up visits as told by your health care provider. This is important. Contact a health care provider if:  You cannot flush your port with saline as directed, or you cannot draw blood from the port.  You have a fever or chills.  You have redness, swelling, or pain around your port insertion site.  You have fluid or blood coming from your port insertion site.  Your port insertion site feels warm to the touch.  You have pus or a bad   smell coming from the port insertion site. Get help right away if:  You have chest pain or shortness of breath.  You have bleeding from your port that you cannot control. Summary  Take care of the port as told by your health care provider. Keep the manufacturer's information card with you at all times.  Change your dressing as told by your health care provider.  Contact a health care provider if you have a fever or chills or if you have redness, swelling, or pain around your port insertion site.  Keep all follow-up visits as told by your health care  provider. This information is not intended to replace advice given to you by your health care provider. Make sure you discuss any questions you have with your health care provider. Document Revised: 12/26/2017 Document Reviewed: 12/26/2017 Elsevier Patient Education  2020 Elsevier Inc. Moderate Conscious Sedation, Adult, Care After These instructions provide you with information about caring for yourself after your procedure. Your health care provider may also give you more specific instructions. Your treatment has been planned according to current medical practices, but problems sometimes occur. Call your health care provider if you have any problems or questions after your procedure. What can I expect after the procedure? After your procedure, it is common:  To feel sleepy for several hours.  To feel clumsy and have poor balance for several hours.  To have poor judgment for several hours.  To vomit if you eat too soon. Follow these instructions at home: For at least 24 hours after the procedure:   Do not: ? Participate in activities where you could fall or become injured. ? Drive. ? Use heavy machinery. ? Drink alcohol. ? Take sleeping pills or medicines that cause drowsiness. ? Make important decisions or sign legal documents. ? Take care of children on your own.  Rest. Eating and drinking  Follow the diet recommended by your health care provider.  If you vomit: ? Drink water, juice, or soup when you can drink without vomiting. ? Make sure you have little or no nausea before eating solid foods. General instructions  Have a responsible adult stay with you until you are awake and alert.  Take over-the-counter and prescription medicines only as told by your health care provider.  If you smoke, do not smoke without supervision.  Keep all follow-up visits as told by your health care provider. This is important. Contact a health care provider if:  You keep feeling  nauseous or you keep vomiting.  You feel light-headed.  You develop a rash.  You have a fever. Get help right away if:  You have trouble breathing. This information is not intended to replace advice given to you by your health care provider. Make sure you discuss any questions you have with your health care provider. Document Revised: 05/12/2017 Document Reviewed: 09/19/2015 Elsevier Patient Education  2020 Elsevier Inc.  

## 2020-06-01 NOTE — H&P (Signed)
Referring Physician(s): Ennever,Peter R  Supervising Physician: Sandi Mariscal  Patient Status:  [facility]  Chief Complaint:  "I'm getting a port a cath"  Subjective: Patient familiar to IR service from left hemipelvic mass biopsy on 03/03/2020 and recent consultation with Dr. 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  He has a history of metastatic bladder carcinoma initially diagnosed in 2016 with prior TURBT and chemoradiation.  Recent  PET scan has revealed:  Hypermetabolic tissue in the LEFT retroperitoneum adjacent to the LEFT sacrum and iliac vein stent consistent residual with residual carcinoma. Mass is reduced significantly in size from CT 02/26/2020.  Focal activity in the colon at the level of the hepatic flexure with associated bowel wall thickening. This is favored unrelated to the urologic carcinoma but could represent colorectal neoplasm. Recommend endoscopy if not current for screening.  He presents today for Port-A-Cath placement for additional chemotherapy.  He denies fever, headache, chest pain, dyspnea, cough, abdominal pain, nausea, vomiting or bleeding.  He does have some chronic back pain and swelling in left leg.  Additional medical history as below.  Past Medical History:  Diagnosis Date  . Cancer ([place])    bladder  . History of bladder cancer    s/p  TURBT 07-25-2014  . Hyperlipidemia   . Hypertension   . Mass of left hand   . 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 BLADDER SURGERY     [place]  . COLONOSCOPY    . CYSTECTOMY    . CYSTOSCOPY WITH BIOPSY N/A 07/25/2014   Procedure: CYSTOSCOPY WITH BLADDER BIOPSY AND FULGERATION;  Surgeon: [name], MD;  Location: [facility];  Service: Urology;  Laterality: N/A;  . CYSTOSCOPY WITH BIOPSY N/A  11/28/2014   Procedure: CYSTOSCOPY WITH  BLADDER BIOPSY;  Surgeon: [name], MD;  Location: [facility];  Service: Urology;  Laterality: N/A;  . HEMORRHOID SURGERY  03/15/2012   Procedure: HEMORRHOIDECTOMY;  Surgeon: [name], MD;  Location: [facility];  Service: General;  Laterality: N/A;  . HERNIA REPAIR    . INGUINAL HERNIA REPAIR Bilateral 2006  . INTRAVASCULAR ULTRASOUND/IVUS N/A 03/13/2020   Procedure: Intravascular Ultrasound/IVUS;  Surgeon: Elam Dutch, MD;  Location: [place] [facility];  Service: Cardiovascular;  Laterality: N/A;  . IR RADIOLOGIST EVAL & MGMT  04/30/2020  . IR RADIOLOGIST EVAL & MGMT  05/27/2020  . IVC VENOGRAPHY N/A 03/13/2020   Procedure: IVC Venography;  Surgeon: Elam Dutch, MD;  Location: [place] [facility];  Service: Cardiovascular;  Laterality: N/A;  . LOWER EXTREMITY VENOGRAPHY Left 03/13/2020   Procedure: LOWER EXTREMITY VENOGRAPHY;  Surgeon: Elam Dutch, MD;  Location: [place] [facility];  Service: Cardiovascular;  Laterality: Left;  Marland Kitchen MASS EXCISION Left 11/12/2018   Procedure: EXCISION MASS LEFT HAND;  Surgeon: Daryll Brod, MD;  Location: [facility];  Service: Orthopedics;  Laterality: Left;  [facility]  . PERIPHERAL VASCULAR INTERVENTION  03/13/2020   Procedure: PERIPHERAL VASCULAR INTERVENTION;  Surgeon: Elam Dutch, MD;  Location: [place] [facility];  Service: Cardiovascular;;  . PERIPHERAL VASCULAR THROMBECTOMY N/A 03/13/2020   Procedure: PERIPHERAL VASCULAR THROMBECTOMY;  Surgeon: Elam Dutch, MD;  Location: [place] [facility];  Service: Cardiovascular;  Laterality: N/A;  . RADIOLOGY WITH ANESTHESIA N/A 02/20/2020   Procedure: MRI  LUMBAR W/O CONTRAST  WITH ANESTHESIA;  Surgeon: Radiologist, Medication, MD;  Location: [place];  Service: Radiology;  Laterality: N/A;  . TESTICLE SURGERY  2004   Ruptured Undescended Right testicle   . TRANSURETHRAL RESECTION OF BLADDER TUMOR WITH GYRUS (TURBT-GYRUS) N/A  05/23/2014   Procedure: TRANSURETHRAL RESECTION OF BLADDER TUMOR WITH GYRUS (TURBT-GYRUS);  Surgeon: [name], MD;  Location: [facility];  Service: Urology;  Laterality: N/A;  . WISDOM TOOTH EXTRACTION        Allergies: Patient has no known allergies.  Medications: Prior to Admission medications   Medication Sig Start Date End Date Taking? Authorizing Provider  ASPIRIN ADULT LOW STRENGTH 81 MG EC tablet TAKE 1 TABLET BY MOUTH EVERY DAY Patient taking differently: Take 81 mg by mouth daily. 10/03/19  Yes Patwardhan, Manish J, MD  enoxaparin (LOVENOX) 80 MG/0.8ML injection Inject 0.8 mLs (80 mg total) into the skin every 12 (twelve) hours. 05/20/20  Yes Volanda Napoleon, MD  gabapentin (NEURONTIN) 600 MG tablet Take 1 tablet (600 mg total) by mouth in the morning, at noon, in the evening, and at bedtime. 04/13/20  Yes Ennever, Rudell Cobb, MD  LORazepam (ATIVAN) 0.5 MG tablet Take 1 tablet (0.5 mg total) by mouth every 6 (six) hours as needed for anxiety. 05/20/20  Yes Volanda Napoleon, MD  meloxicam (MOBIC) 15 MG tablet Take 1 tablet (15 mg total) by mouth daily. 03/20/20  Yes Ennever, Rudell Cobb, MD  Oxycodone HCl 10 MG TABS TAKE 1 TABLET (10 MG TOTAL) BY MOUTH EVERY 4 (FOUR) HOURS AS NEEDED. 05/01/20  Yes Ennever, Rudell Cobb, MD  fentaNYL (DURAGESIC) 50 MCG/HR Place 1 patch onto the skin every 3 (three) days. 05/04/20   Volanda Napoleon, MD  zolpidem (AMBIEN) 10 MG tablet TAKE 1 TABLET (10 MG TOTAL) BY MOUTH AT BEDTIME AS NEEDED FOR SLEEP. 05/01/20 05/31/20  Volanda Napoleon, MD     Vital Signs: BP 131/75   Pulse 77   Temp 99.1 F (37.3 C) (Oral)   Resp 18   SpO2 100%   Physical Exam awake, alert.  Chest with distant breath sounds bilaterally.  Heart with regular rate and rhythm.  Abdomen soft, positive bowel sounds, nontender.  No significant right lower extremity edema.  Edema noted left lower extremity, hose in place  Imaging: NM PET Image Restag (PS) Skull Base To  Thigh  Result Date: 06/01/2020 CLINICAL DATA:  Subsequent treatment strategy for urologic carcinoma. Assess response to treatment. EXAM: NUCLEAR MEDICINE PET SKULL BASE TO THIGH TECHNIQUE: 8.1 mCi F-18 FDG was injected intravenously. Full-ring PET imaging was performed from the skull base to thigh after the radiotracer. CT data was obtained and used for attenuation correction and anatomic localization. Fasting blood glucose: 98 mg/dl COMPARISON:  CT 05/18/2020, 03/02/2020 FINDINGS: Mediastinal blood pool activity: SUV max 2.0 Liver activity: SUV max 2.7 NECK: No hypermetabolic lymph nodes in the neck. Incidental CT findings: none CHEST: No hypermetabolic mediastinal or hilar nodes. No suspicious pulmonary nodules on the CT scan. Incidental CT findings: Paraseptal emphysema and hyperinflated lungs. ABDOMEN/PELVIS: Hypermetabolic mass in the LEFT paraspinal tissue anterior to the sacrum with SUV max equal 7.6. Ill-defined mass at this level measures 2.6 by 2.0 cm. This is site of previously large retroperitoneal mass. A second focus of activity just anterior to the larger mass and adjacent to the venous stent SUV max equal 5.0. Focal activity in the RIGHT upper quadrant associated with the colon with SUV max equal 5.9  on image 135. On the CT portion exam there is mild bowel wall thickening of the colon at this level. No abnormal activity liver. No additional adenopathy. Diffuse activity in stomach is favored physiologic Incidental CT findings: LEFT common iliac vein stent SKELETON: Again noted erosion of the anterior margin of the LEFT sacrum adjacent to hypermetabolic mass. No additional evidence skeletal metastasis. Incidental CT findings: none IMPRESSION: Hypermetabolic tissue in the LEFT retroperitoneum adjacent to the LEFT sacrum and iliac vein stent consistent residual with residual carcinoma. Mass is reduced significantly in size from CT 02/26/2020. Focal activity in the colon at the level of the hepatic  flexure with associated bowel wall thickening. This is favored unrelated to the urologic carcinoma but could represent colorectal neoplasm. Recommend endoscopy if not current for screening. Electronically Signed   By: Suzy Bouchard M.D.   On: 06/01/2020 08:21    Labs:  CBC: Recent Labs    04/01/20 0937 04/08/20 0846 05/20/20 0807 06/01/20 1217  WBC 6.5 5.2 5.1 4.9  HGB 9.7* 10.0* 11.6* 11.6*  HCT 29.2* 30.2* 35.6* 36.2*  PLT 209 177 287 190    COAGS: Recent Labs    03/02/20 0105  INR 1.0    BMP: Recent Labs    03/02/20 0105 03/03/20 0733 03/04/20 0337 03/11/20 0813 03/18/20 0858 03/25/20 0941 04/01/20 0937 04/08/20 0846 05/18/20 0835 05/20/20 0807  NA 135 134* 134* 134*   < > 134* 135 135  --  139  K 5.1 4.8 4.1 4.8   < > 4.3 4.2 4.4  --  3.9  CL 98 98 100 98   < > 101 102 103  --  104  CO2 28 25 26  32   < > 26 27 27   --  29  GLUCOSE 119* 121* 131* 94   < > 99 102* 96  --  117*  BUN 21 18 13 14    < > 18 14 12   --  16  CALCIUM 9.3 9.3 8.7* 10.2   < > 9.5 9.5 9.3  --  10.2  CREATININE 0.94 0.74 0.69 0.72   < > 0.76 0.61 0.66 0.80 0.89  GFRNONAA >60 >60 >60 >60   < > >60 >60 >60  --  >60  GFRAA >60 >60 >60 >60  --   --   --   --   --   --    < > = values in this interval not displayed.    LIVER FUNCTION TESTS: Recent Labs    03/25/20 0941 04/01/20 0937 04/08/20 0846 05/20/20 0807  BILITOT 0.4 0.3 0.4 0.3  AST 11* 12* 11* 15  ALT 18 16 14 15   ALKPHOS 74 82 77 42  PROT 6.7 7.0 6.8 7.0  ALBUMIN 3.6 3.7 3.3* 4.1    Assessment and Plan: Patient familiar to IR service from left hemipelvic mass biopsy on 03/03/2020 and recent consultation with Dr. 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  He has a history of metastatic bladder carcinoma initially diagnosed in 2016 with prior TURBT and chemoradiation.  Recent  PET scan has revealed:   Hypermetabolic tissue in the LEFT retroperitoneum adjacent to the LEFT sacrum and iliac vein stent consistent residual with residual carcinoma. Mass is reduced significantly in size from CT 02/26/2020.  Focal activity in the colon at the level of the hepatic flexure with associated bowel wall thickening. This is favored unrelated to the  urologic carcinoma but could represent colorectal neoplasm. Recommend endoscopy if not current for screening.  He presents today for Port-A-Cath placement for additional chemotherapy.Risks and benefits of image guided port-a-catheter placement was discussed with the patient including, but not limited to bleeding, infection, pneumothorax, or fibrin sheath development and need for additional procedures.  All of the patient's questions were answered, patient is agreeable to proceed. Consent signed and in chart.     Electronically Signed: D. Rowe Robert, PA-C 06/01/2020, 1:11 PM   I spent a total of 25 minutes at the the patient's bedside AND on the patient's hospital floor or unit, greater than 50% of which was counseling/coordinating care for Port-A-Cath placement

## 2020-06-01 NOTE — Procedures (Signed)
Pre Procedure Dx: Poor venous access Post Procedural Dx: Same  Successful placement of right IJ approach port-a-cath with tip at the superior caval atrial junction. The catheter is ready for immediate use.  Estimated Blood Loss: Minimal  Complications: None immediate.  Jay Marnell Mcdaniel, MD Pager #: [phone]   

## 2020-06-01 NOTE — Progress Notes (Signed)
Oncology Nurse Navigator Documentation  Oncology Nurse Navigator Flowsheets 06/01/2020  Abnormal Finding Date -  Confirmed Diagnosis Date -  Diagnosis Status -  Planned Course of Treatment -  Phase of Treatment -  Chemotherapy Pending- Reason: -  Chemo/Radiation Concurrent Actual Start Date: -  Navigator Follow Up Date: 06/02/2020  Navigator Follow Up Reason: Chemotherapy  Navigator Location [facility]  Referral Date to RadOnc/MedOnc -  Navigator Encounter Type Scan Review  Telephone -  Treatment Initiated Date -  Patient Visit Type MedOnc  Treatment Phase Active Tx  Barriers/Navigation Needs Coordination of Care;Education  Education -  Interventions None Required  Acuity Level 2-Minimal Needs (1-2 Barriers Identified)  Referrals -  Coordination of Care -  Education Method -  Support Groups/Services Friends and Family  Time Spent with Patient 15

## 2020-06-02 ENCOUNTER — Inpatient Hospital Stay: Payer: 59

## 2020-06-02 ENCOUNTER — Other Ambulatory Visit (HOSPITAL_COMMUNITY): Payer: Self-pay | Admitting: Interventional Radiology

## 2020-06-02 ENCOUNTER — Other Ambulatory Visit: Payer: Self-pay | Admitting: Hematology & Oncology

## 2020-06-02 ENCOUNTER — Other Ambulatory Visit: Payer: Self-pay

## 2020-06-02 ENCOUNTER — Other Ambulatory Visit: Payer: Self-pay | Admitting: *Deleted

## 2020-06-02 ENCOUNTER — Encounter: Payer: Self-pay | Admitting: *Deleted

## 2020-06-02 ENCOUNTER — Telehealth (HOSPITAL_COMMUNITY): Payer: Self-pay | Admitting: Radiology

## 2020-06-02 VITALS — BP 131/70 | HR 80 | Temp 97.8°F | Resp 16

## 2020-06-02 DIAGNOSIS — R222 Localized swelling, mass and lump, trunk: Secondary | ICD-10-CM

## 2020-06-02 DIAGNOSIS — C679 Malignant neoplasm of bladder, unspecified: Secondary | ICD-10-CM

## 2020-06-02 DIAGNOSIS — I82402 Acute embolism and thrombosis of unspecified deep veins of left lower extremity: Secondary | ICD-10-CM

## 2020-06-02 DIAGNOSIS — C799 Secondary malignant neoplasm of unspecified site: Secondary | ICD-10-CM

## 2020-06-02 DIAGNOSIS — Z5111 Encounter for antineoplastic chemotherapy: Secondary | ICD-10-CM | POA: Diagnosis not present

## 2020-06-02 LAB — CBC WITH DIFFERENTIAL (CANCER CENTER ONLY)
Abs Immature Granulocytes: 0.01 10*3/uL (ref 0.00–0.07)
Basophils Absolute: 0.1 10*3/uL (ref 0.0–0.1)
Basophils Relative: 1 %
Eosinophils Absolute: 0.4 10*3/uL (ref 0.0–0.5)
Eosinophils Relative: 8 %
HCT: 33.3 % — ABNORMAL LOW (ref 39.0–52.0)
Hemoglobin: 11.1 g/dL — ABNORMAL LOW (ref 13.0–17.0)
Immature Granulocytes: 0 %
Lymphocytes Relative: 15 %
Lymphs Abs: 0.8 10*3/uL (ref 0.7–4.0)
MCH: 33.3 pg (ref 26.0–34.0)
MCHC: 33.3 g/dL (ref 30.0–36.0)
MCV: 100 fL (ref 80.0–100.0)
Monocytes Absolute: 0.6 10*3/uL (ref 0.1–1.0)
Monocytes Relative: 11 %
Neutro Abs: 3.7 10*3/uL (ref 1.7–7.7)
Neutrophils Relative %: 65 %
Platelet Count: 188 10*3/uL (ref 150–400)
RBC: 3.33 MIL/uL — ABNORMAL LOW (ref 4.22–5.81)
RDW: 17.2 % — ABNORMAL HIGH (ref 11.5–15.5)
WBC Count: 5.6 10*3/uL (ref 4.0–10.5)
nRBC: 0 % (ref 0.0–0.2)

## 2020-06-02 LAB — CMP (CANCER CENTER ONLY)
ALT: 13 U/L (ref 0–44)
AST: 13 U/L — ABNORMAL LOW (ref 15–41)
Albumin: 4 g/dL (ref 3.5–5.0)
Alkaline Phosphatase: 37 U/L — ABNORMAL LOW (ref 38–126)
Anion gap: 5 (ref 5–15)
BUN: 15 mg/dL (ref 8–23)
CO2: 28 mmol/L (ref 22–32)
Calcium: 10.2 mg/dL (ref 8.9–10.3)
Chloride: 105 mmol/L (ref 98–111)
Creatinine: 0.91 mg/dL (ref 0.61–1.24)
GFR, Estimated: >60 mL/min (ref >60)
Glucose, Bld: 83 mg/dL (ref 70–99)
Potassium: 3.9 mmol/L (ref 3.5–5.1)
Sodium: 138 mmol/L (ref 135–145)
Total Bilirubin: 0.3 mg/dL (ref 0.3–1.2)
Total Protein: 6.8 g/dL (ref 6.5–8.1)

## 2020-06-02 MED ORDER — LORAZEPAM 0.5 MG PO TABS: 0.5000 mg | ORAL_TABLET | Freq: Four times a day (QID) | ORAL | 0 refills | Status: DC | PRN

## 2020-06-02 MED ORDER — SODIUM CHLORIDE 0.9 % IV SOLN
16.0000 mg | Freq: Once | INTRAVENOUS | Status: AC
  Administered 2020-06-02: 12:00:00 16 mg via INTRAVENOUS
  Filled 2020-06-02: qty 8

## 2020-06-02 MED ORDER — SODIUM CHLORIDE 0.9 % IV SOLN
10.0000 mg | Freq: Once | INTRAVENOUS | Status: AC
  Administered 2020-06-02: 12:00:00 10 mg via INTRAVENOUS
  Filled 2020-06-02: qty 10

## 2020-06-02 MED ORDER — SODIUM CHLORIDE 0.9 % IV SOLN
30.0000 mg/m2 | Freq: Once | INTRAVENOUS | Status: AC
  Administered 2020-06-02: 13:00:00 58.25 mg via INTRAVENOUS
  Filled 2020-06-02: qty 2.33

## 2020-06-02 MED ORDER — SODIUM CHLORIDE 0.9% FLUSH
10.0000 mL | INTRAVENOUS | Status: DC | PRN
  Administered 2020-06-02: 15:00:00 10 mL
  Filled 2020-06-02: qty 10

## 2020-06-02 MED ORDER — SODIUM CHLORIDE 0.9 % IV SOLN
35.0000 mg/m2 | Freq: Once | INTRAVENOUS | Status: AC
  Administered 2020-06-02: 14:00:00 68 mg via INTRAVENOUS
  Filled 2020-06-02: qty 68

## 2020-06-02 MED ORDER — SODIUM CHLORIDE 0.9 % IV SOLN
Freq: Once | INTRAVENOUS | Status: AC
  Filled 2020-06-02: qty 10

## 2020-06-02 MED ORDER — SODIUM CHLORIDE 0.9 % IV SOLN
150.0000 mg | Freq: Once | INTRAVENOUS | Status: AC
  Administered 2020-06-02: 12:00:00 150 mg via INTRAVENOUS
  Filled 2020-06-02: qty 5

## 2020-06-02 MED ORDER — SODIUM CHLORIDE 0.9 % IV SOLN
Freq: Once | INTRAVENOUS | Status: AC
  Filled 2020-06-02: qty 250

## 2020-06-02 MED ORDER — HEPARIN SOD (PORK) LOCK FLUSH 100 UNIT/ML IV SOLN
500.0000 [IU] | Freq: Once | INTRAVENOUS | Status: AC | PRN
  Administered 2020-06-02: 15:00:00 500 [IU]
  Filled 2020-06-02: qty 5

## 2020-06-02 MED ORDER — LIDOCAINE-PRILOCAINE 2.5-2.5 % EX CREA: TOPICAL_CREAM | CUTANEOUS | 3 refills | Status: DC

## 2020-06-02 MED FILL — LIDOCAINE-PRILOCAINE CREAM: 2.5-2.5 | 30 days supply | Qty: 30 | Fill #0

## 2020-06-02 NOTE — Progress Notes (Signed)
Patient here to begin a new treatment regimen after completion of ChemoRT. He had his port placed yesterday and patient states procedure went very well without any discomfort. He declined chemo education for new regimen, but states he has no questions at this time.   Oncology Nurse Navigator Documentation  Oncology Nurse Navigator Flowsheets 06/02/2020  Abnormal Finding Date -  Confirmed Diagnosis Date -  Diagnosis Status -  Planned Course of Treatment -  Phase of Treatment -  Chemotherapy Pending- Reason: -  Chemo/Radiation Concurrent Actual Start Date: -  Navigator Follow Up Date: 06/16/2020  Navigator Follow Up Reason: Follow-up Appointment;Chemotherapy  Navigator Location [facility]  Referral Date to RadOnc/MedOnc -  Navigator Encounter Type Treatment  Telephone -  Treatment Initiated Date -  Patient Visit Type MedOnc  Treatment Phase Active Tx  Barriers/Navigation Needs Coordination of Care;Education  Education Other  Interventions Education;Psycho-Social Support  Acuity Level 2-Minimal Needs (1-2 Barriers Identified)  Referrals -  Coordination of Care -  Education Method Verbal  Support Groups/Services Friends and Family  Time Spent with Patient 30

## 2020-06-02 NOTE — Patient Instructions (Signed)

## 2020-06-02 NOTE — Progress Notes (Signed)
Pt discharged in no apparent distress. Pt left ambulatory without assistance. Pt aware of discharge instructions and verbalized understanding and had no further questions.  

## 2020-06-02 NOTE — Patient Instructions (Signed)
[facility] Discharge Instructions for Patients Receiving Chemotherapy  Today you received the following chemotherapy agents methotrexate and cisplatin   To help prevent nausea and vomiting after your treatment, we encourage you to take your nausea medicationas directed   If you develop nausea and vomiting that is not controlled by your nausea medication, call the clinic.   BELOW ARE SYMPTOMS THAT SHOULD BE REPORTED IMMEDIATELY:  *FEVER GREATER THAN 100.5 F  *CHILLS WITH OR WITHOUT FEVER  NAUSEA AND VOMITING THAT IS NOT CONTROLLED WITH YOUR NAUSEA MEDICATION  *UNUSUAL SHORTNESS OF BREATH  *UNUSUAL BRUISING OR BLEEDING  TENDERNESS IN MOUTH AND THROAT WITH OR WITHOUT PRESENCE OF ULCERS  *URINARY PROBLEMS  *BOWEL PROBLEMS  UNUSUAL RASH Items with * indicate a potential emergency and should be followed up as soon as possible.  Feel free to call the clinic should you have any questions or concerns. The clinic phone number is [phone].  Please show the [place] at check-in to the Emergency Department and triage nurse.

## 2020-06-02 NOTE — Telephone Encounter (Signed)
Called pt, wife, brother, and finally got in touch with his daughter. Scheduling procedure for 12/27. JM

## 2020-06-03 ENCOUNTER — Inpatient Hospital Stay: Payer: 59

## 2020-06-03 ENCOUNTER — Other Ambulatory Visit: Payer: Self-pay | Admitting: Radiology

## 2020-06-03 VITALS — BP 116/64 | HR 64 | Temp 98.0°F | Resp 17

## 2020-06-03 DIAGNOSIS — Z5111 Encounter for antineoplastic chemotherapy: Secondary | ICD-10-CM | POA: Diagnosis not present

## 2020-06-03 DIAGNOSIS — C799 Secondary malignant neoplasm of unspecified site: Secondary | ICD-10-CM

## 2020-06-03 MED ORDER — SODIUM CHLORIDE 0.9 % IV SOLN
10.0000 mg | Freq: Once | INTRAVENOUS | Status: AC
  Administered 2020-06-03: 11:00:00 10 mg via INTRAVENOUS
  Filled 2020-06-03: qty 10

## 2020-06-03 MED ORDER — PALONOSETRON HCL INJECTION 0.25 MG/5ML: 0.2500 mg | Freq: Once | INTRAVENOUS | Status: DC

## 2020-06-03 MED ORDER — SODIUM CHLORIDE 0.9 % IV SOLN
Freq: Once | INTRAVENOUS | Status: AC
  Filled 2020-06-03: qty 10

## 2020-06-03 MED ORDER — PALONOSETRON HCL INJECTION 0.25 MG/5ML
INTRAVENOUS | Status: AC
  Filled 2020-06-03: qty 5

## 2020-06-03 MED ORDER — SODIUM CHLORIDE 0.9 % IV SOLN
35.0000 mg/m2 | Freq: Once | INTRAVENOUS | Status: AC
  Administered 2020-06-03: 12:00:00 68 mg via INTRAVENOUS
  Filled 2020-06-03: qty 68

## 2020-06-03 MED ORDER — HEPARIN SOD (PORK) LOCK FLUSH 100 UNIT/ML IV SOLN
500.0000 [IU] | Freq: Once | INTRAVENOUS | Status: AC | PRN
  Administered 2020-06-03: 15:00:00 500 [IU]
  Filled 2020-06-03: qty 5

## 2020-06-03 MED ORDER — SODIUM CHLORIDE 0.9% FLUSH
10.0000 mL | INTRAVENOUS | Status: DC | PRN
  Administered 2020-06-03: 15:00:00 10 mL
  Filled 2020-06-03: qty 10

## 2020-06-03 MED ORDER — PALONOSETRON HCL INJECTION 0.25 MG/5ML
0.2500 mg | Freq: Once | INTRAVENOUS | Status: AC
  Administered 2020-06-03: 11:00:00 0.25 mg via INTRAVENOUS

## 2020-06-03 MED ORDER — VINBLASTINE SULFATE CHEMO INJECTION 1 MG/ML
3.0000 mg/m2 | Freq: Once | INTRAVENOUS | Status: AC
  Administered 2020-06-03: 12:00:00 5.8 mg via INTRAVENOUS
  Filled 2020-06-03: qty 5.8

## 2020-06-03 MED ORDER — DOXORUBICIN HCL CHEMO IV INJECTION 2 MG/ML
30.0000 mg/m2 | Freq: Once | INTRAVENOUS | Status: AC
Start: 2020-06-03 — End: 2020-06-03
  Administered 2020-06-03: 12:00:00 58 mg via INTRAVENOUS
  Filled 2020-06-03: qty 29

## 2020-06-03 MED ORDER — SODIUM CHLORIDE 0.9 % IV SOLN
Freq: Once | INTRAVENOUS | Status: AC
  Filled 2020-06-03: qty 250

## 2020-06-03 NOTE — Patient Instructions (Signed)
[facility] Discharge Instructions for Patients Receiving Chemotherapy  Today you received the following chemotherapy agents: Adriamycin, Vinblastine, Cisplatin **NO ZOFRAN FOR 3 DAYS FOLLOWING CHEMOTHERAPY TREATMENT**  To help prevent nausea and vomiting after your treatment, we encourage you to take your nausea medicationas directed   If you develop nausea and vomiting that is not controlled by your nausea medication, call the clinic.   BELOW ARE SYMPTOMS THAT SHOULD BE REPORTED IMMEDIATELY:  *FEVER GREATER THAN 100.5 F  *CHILLS WITH OR WITHOUT FEVER  NAUSEA AND VOMITING THAT IS NOT CONTROLLED WITH YOUR NAUSEA MEDICATION  *UNUSUAL SHORTNESS OF BREATH  *UNUSUAL BRUISING OR BLEEDING  TENDERNESS IN MOUTH AND THROAT WITH OR WITHOUT PRESENCE OF ULCERS  *URINARY PROBLEMS  *BOWEL PROBLEMS  UNUSUAL RASH Items with * indicate a potential emergency and should be followed up as soon as possible.  Feel free to call the clinic should you have any questions or concerns. The clinic phone number is [phone].  Please show the [name] at check-in to the Emergency Department and triage nurse.

## 2020-06-03 NOTE — Progress Notes (Signed)
Ok to release nausea premeds without 200 cc UOP.  Ok to run post hydration fluids with Cisplatin per Dr Marin Olp.

## 2020-06-04 ENCOUNTER — Inpatient Hospital Stay: Payer: 59

## 2020-06-04 ENCOUNTER — Encounter (HOSPITAL_COMMUNITY): Payer: Self-pay | Admitting: Interventional Radiology

## 2020-06-04 ENCOUNTER — Other Ambulatory Visit: Payer: Self-pay

## 2020-06-04 VITALS — BP 123/67 | HR 66 | Temp 98.6°F | Resp 17

## 2020-06-04 DIAGNOSIS — C799 Secondary malignant neoplasm of unspecified site: Secondary | ICD-10-CM

## 2020-06-04 DIAGNOSIS — Z5111 Encounter for antineoplastic chemotherapy: Secondary | ICD-10-CM | POA: Diagnosis not present

## 2020-06-04 MED ORDER — PEGFILGRASTIM INJECTION 6 MG/0.6ML ~~LOC~~
6.0000 mg | PREFILLED_SYRINGE | Freq: Once | SUBCUTANEOUS | Status: AC
  Administered 2020-06-04: 16:00:00 6 mg via SUBCUTANEOUS
  Filled 2020-06-04: qty 0.6

## 2020-06-04 MED FILL — ZOLPIDEM TARTRATE 10 MG TAB: 10 | 30 days supply | Qty: 30 | Fill #0

## 2020-06-04 MED FILL — GABAPENTIN 600 MG TABLET: 600 | 30 days supply | Qty: 120 | Fill #2

## 2020-06-04 NOTE — Progress Notes (Addendum)
[name] denies chest pain or shortness of breath. [name] does not have any s/s of Covid, no has patient been around anyone who has.  [name] will be tested on the morning of surgery.  [name] is on Loveonox, he takes it mid am, he will not take the morning of procedure.  Patient  Reports that he was not given instructions to hold.

## 2020-06-04 NOTE — Patient Instructions (Signed)

## 2020-06-07 NOTE — Anesthesia Preprocedure Evaluation (Addendum)
Anesthesia Evaluation  Patient identified by MRN, date of birth, ID band Patient awake    Reviewed: Allergy & Precautions, H&P , NPO status , Patient's Chart, lab work & pertinent test results  Airway Mallampati: II  TM Distance: >3 FB Neck ROM: Full    Dental  (+) Dental Advisory Given, Edentulous Upper, Partial Lower   Pulmonary Current Smoker,  Quit about a week ago  Heavy smoker in the past    Pulmonary exam normal breath sounds clear to auscultation       Cardiovascular hypertension, Pt. on medications + DVT (recurrent DVT LLE, on lovenox)  Normal cardiovascular exam Rhythm:Regular Rate:Normal     Neuro/Psych negative neurological ROS  negative psych ROS   GI/Hepatic negative GI ROS, Neg liver ROS,   Endo/Other  negative endocrine ROS  Renal/GU negative Renal ROS Bladder dysfunction (hx bladder ca s/p TURBT 2016)      Musculoskeletal negative musculoskeletal ROS (+)   Abdominal Normal abdominal exam  (+)   Peds negative pediatric ROS (+)  Hematology  (+) Blood dyscrasia, anemia , hct 33.3, plt 188   Anesthesia Other Findings   Reproductive/Obstetrics negative OB ROS                          Anesthesia Physical Anesthesia Plan  ASA: III  Anesthesia Plan: General   Post-op Pain Management:    Induction: Intravenous  PONV Risk Score and Plan: 1 and Ondansetron, Dexamethasone, Treatment may vary due to age or medical condition and Midazolam  Airway Management Planned: Oral ETT  Additional Equipment: None  Intra-op Plan:   Post-operative Plan: Extubation in OR  Informed Consent: I have reviewed the patients History and Physical, chart, labs and discussed the procedure including the risks, benefits and alternatives for the proposed anesthesia with the patient or authorized representative who has indicated his/her understanding and acceptance.     Dental advisory  given  Plan Discussed with: CRNA  Anesthesia Plan Comments:       Anesthesia Quick Evaluation

## 2020-06-08 ENCOUNTER — Encounter (HOSPITAL_COMMUNITY): Payer: Self-pay

## 2020-06-08 ENCOUNTER — Ambulatory Visit (HOSPITAL_COMMUNITY)
Admission: RE | Admit: 2020-06-08 | Discharge: 2020-06-08 | Disposition: A | Discharge status: Home / Self Care | Payer: 59 | Source: Ambulatory Visit | Attending: Interventional Radiology | Admitting: Interventional Radiology

## 2020-06-08 ENCOUNTER — Ambulatory Visit (HOSPITAL_COMMUNITY): Payer: 59 | Admitting: Anesthesiology

## 2020-06-08 ENCOUNTER — Ambulatory Visit (HOSPITAL_COMMUNITY)
Admission: RE | Admit: 2020-06-08 | Discharge: 2020-06-09 | Disposition: A | Discharge status: Home / Self Care | Payer: 59 | Attending: Interventional Radiology | Admitting: Interventional Radiology

## 2020-06-08 ENCOUNTER — Encounter (HOSPITAL_COMMUNITY)
Admission: RE | Disposition: A | Discharge status: Home / Self Care | Payer: Self-pay | Source: Home / Self Care | Attending: Interventional Radiology

## 2020-06-08 ENCOUNTER — Other Ambulatory Visit: Payer: Self-pay

## 2020-06-08 ENCOUNTER — Encounter (HOSPITAL_COMMUNITY): Payer: Self-pay | Admitting: Interventional Radiology

## 2020-06-08 DIAGNOSIS — Z95828 Presence of other vascular implants and grafts: Secondary | ICD-10-CM | POA: Diagnosis not present

## 2020-06-08 DIAGNOSIS — I82402 Acute embolism and thrombosis of unspecified deep veins of left lower extremity: Secondary | ICD-10-CM | POA: Diagnosis not present

## 2020-06-08 DIAGNOSIS — Z7982 Long term (current) use of aspirin: Secondary | ICD-10-CM | POA: Diagnosis not present

## 2020-06-08 DIAGNOSIS — C801 Malignant (primary) neoplasm, unspecified: Secondary | ICD-10-CM | POA: Insufficient documentation

## 2020-06-08 DIAGNOSIS — Z20822 Contact with and (suspected) exposure to covid-19: Secondary | ICD-10-CM | POA: Diagnosis not present

## 2020-06-08 DIAGNOSIS — Z8551 Personal history of malignant neoplasm of bladder: Secondary | ICD-10-CM | POA: Diagnosis not present

## 2020-06-08 DIAGNOSIS — C7989 Secondary malignant neoplasm of other specified sites: Secondary | ICD-10-CM | POA: Insufficient documentation

## 2020-06-08 DIAGNOSIS — Z79899 Other long term (current) drug therapy: Secondary | ICD-10-CM | POA: Insufficient documentation

## 2020-06-08 HISTORY — PX: IR IVC FILTER PLMT / S&I /IMG GUID/MOD SED: IMG701

## 2020-06-08 HISTORY — PX: IR VENO/EXT/UNI LEFT: IMG675

## 2020-06-08 HISTORY — PX: RADIOLOGY WITH ANESTHESIA: SHX6223

## 2020-06-08 HISTORY — PX: IR PTA VENOUS EXCEPT DIALYSIS CIRCUIT: IMG6126

## 2020-06-08 HISTORY — PX: IR TRANSCATH PLC STENT 1ST ART NOT LE CV CAR VERT CAR: IMG5443

## 2020-06-08 HISTORY — PX: IR US GUIDE VASC ACCESS LEFT: IMG2389

## 2020-06-08 HISTORY — PX: IR INTRAVASCULAR ULTRASOUND NON CORONARY: IMG6085

## 2020-06-08 LAB — CBC WITH DIFFERENTIAL/PLATELET
Abs Immature Granulocytes: 1.44 10*3/uL — ABNORMAL HIGH (ref 0.00–0.07)
Basophils Absolute: 0.1 10*3/uL (ref 0.0–0.1)
Basophils Relative: 1 %
Eosinophils Absolute: 0.4 10*3/uL (ref 0.0–0.5)
Eosinophils Relative: 2 %
HCT: 30.4 % — ABNORMAL LOW (ref 39.0–52.0)
Hemoglobin: 10.4 g/dL — ABNORMAL LOW (ref 13.0–17.0)
Immature Granulocytes: 8 %
Lymphocytes Relative: 4 %
Lymphs Abs: 0.7 10*3/uL (ref 0.7–4.0)
MCH: 34 pg (ref 26.0–34.0)
MCHC: 34.2 g/dL (ref 30.0–36.0)
MCV: 99.3 fL (ref 80.0–100.0)
Monocytes Absolute: 0.3 10*3/uL (ref 0.1–1.0)
Monocytes Relative: 2 %
Neutro Abs: 16.1 10*3/uL — ABNORMAL HIGH (ref 1.7–7.7)
Neutrophils Relative %: 83 %
Platelets: 111 10*3/uL — ABNORMAL LOW (ref 150–400)
RBC: 3.06 MIL/uL — ABNORMAL LOW (ref 4.22–5.81)
RDW: 17.2 % — ABNORMAL HIGH (ref 11.5–15.5)
WBC: 19.1 10*3/uL — ABNORMAL HIGH (ref 4.0–10.5)
nRBC: 0 % (ref 0.0–0.2)

## 2020-06-08 LAB — BASIC METABOLIC PANEL
Anion gap: 10 (ref 5–15)
BUN: 13 mg/dL (ref 8–23)
CO2: 26 mmol/L (ref 22–32)
Calcium: 9.1 mg/dL (ref 8.9–10.3)
Chloride: 101 mmol/L (ref 98–111)
Creatinine, Ser: 0.84 mg/dL (ref 0.61–1.24)
GFR, Estimated: >60 mL/min (ref >60)
Glucose, Bld: 100 mg/dL — ABNORMAL HIGH (ref 70–99)
Potassium: 3.8 mmol/L (ref 3.5–5.1)
Sodium: 137 mmol/L (ref 135–145)

## 2020-06-08 LAB — POCT ACTIVATED CLOTTING TIME
Activated Clotting Time: 166 seconds
Activated Clotting Time: 208 seconds
Activated Clotting Time: 190 seconds
Activated Clotting Time: 225 seconds

## 2020-06-08 LAB — SARS CORONAVIRUS 2 BY RT PCR (HOSPITAL ORDER, PERFORMED IN ~~LOC~~ HOSPITAL LAB): SARS Coronavirus 2: NEGATIVE

## 2020-06-08 LAB — PROTIME-INR
INR: 0.9 (ref 0.8–1.2)
Prothrombin Time: 11.9 seconds (ref 11.4–15.2)

## 2020-06-08 SURGERY — IR WITH ANESTHESIA
Anesthesia: General

## 2020-06-08 MED ORDER — FENTANYL CITRATE (PF) 100 MCG/2ML IJ SOLN
INTRAMUSCULAR | Status: DC | PRN
  Administered 2020-06-08 (×5): 50 ug via INTRAVENOUS

## 2020-06-08 MED ORDER — MIDAZOLAM HCL 5 MG/5ML IJ SOLN
INTRAMUSCULAR | Status: DC | PRN
  Administered 2020-06-08: 2 mg via INTRAVENOUS

## 2020-06-08 MED ORDER — ORAL CARE MOUTH RINSE: 15.0000 mL | Freq: Once | OROMUCOSAL | Status: AC

## 2020-06-08 MED ORDER — ZOLPIDEM TARTRATE 5 MG PO TABS
10.0000 mg | ORAL_TABLET | Freq: Every evening | ORAL | Status: DC | PRN
  Administered 2020-06-08: 22:00:00 10 mg via ORAL
  Filled 2020-06-08: qty 2

## 2020-06-08 MED ORDER — ONDANSETRON HCL 4 MG/2ML IJ SOLN
INTRAMUSCULAR | Status: DC | PRN
  Administered 2020-06-08: 4 mg via INTRAVENOUS

## 2020-06-08 MED ORDER — ENOXAPARIN SODIUM 80 MG/0.8ML ~~LOC~~ SOLN
80.0000 mg | Freq: Two times a day (BID) | SUBCUTANEOUS | Status: DC
  Administered 2020-06-08 – 2020-06-09 (×2): 80 mg via SUBCUTANEOUS
  Filled 2020-06-08 (×3): qty 0.8

## 2020-06-08 MED ORDER — PROPOFOL 10 MG/ML IV BOLUS
INTRAVENOUS | Status: DC | PRN
  Administered 2020-06-08: 130 mg via INTRAVENOUS
  Administered 2020-06-08: 20 mg via INTRAVENOUS
  Administered 2020-06-08: 30 mg via INTRAVENOUS
  Administered 2020-06-08: 20 mg via INTRAVENOUS

## 2020-06-08 MED ORDER — ENOXAPARIN SODIUM 80 MG/0.8ML ~~LOC~~ SOLN
80.0000 mg | Freq: Once | SUBCUTANEOUS | Status: AC
  Administered 2020-06-08: 13:00:00 80 mg via SUBCUTANEOUS
  Filled 2020-06-08: qty 0.8

## 2020-06-08 MED ORDER — PHENYLEPHRINE 40 MCG/ML (10ML) SYRINGE FOR IV PUSH (FOR BLOOD PRESSURE SUPPORT)
PREFILLED_SYRINGE | INTRAVENOUS | Status: DC | PRN
  Administered 2020-06-08: 80 ug via INTRAVENOUS

## 2020-06-08 MED ORDER — HEPARIN SODIUM (PORCINE) 1000 UNIT/ML IJ SOLN
INTRAMUSCULAR | Status: DC | PRN
  Administered 2020-06-08 (×2): 6000 [IU] via INTRAVENOUS
  Administered 2020-06-08: 8000 [IU] via INTRAVENOUS
  Administered 2020-06-08: 3000 [IU] via INTRAVENOUS

## 2020-06-08 MED ORDER — IODIXANOL 320 MG/ML IV SOLN
100.0000 mL | Freq: Once | INTRAVENOUS | Status: AC | PRN
  Administered 2020-06-08: 12:00:00 80 mL via INTRAVENOUS

## 2020-06-08 MED ORDER — PHENYLEPHRINE HCL-NACL 10-0.9 MG/250ML-% IV SOLN
INTRAVENOUS | Status: DC | PRN
  Administered 2020-06-08: 15 ug/min via INTRAVENOUS

## 2020-06-08 MED ORDER — FENTANYL 50 MCG/HR TD PT72
1.0000 | MEDICATED_PATCH | TRANSDERMAL | Status: DC
  Administered 2020-06-08: 18:00:00 1 via TRANSDERMAL
  Filled 2020-06-08: qty 1

## 2020-06-08 MED ORDER — IOHEXOL 300 MG/ML  SOLN
100.0000 mL | Freq: Once | INTRAMUSCULAR | Status: AC | PRN
  Administered 2020-06-08: 12:00:00 30 mL

## 2020-06-08 MED ORDER — ASPIRIN EC 81 MG PO TBEC
81.0000 mg | DELAYED_RELEASE_TABLET | Freq: Every day | ORAL | Status: DC
  Administered 2020-06-09: 08:00:00 81 mg via ORAL
  Filled 2020-06-08: qty 1

## 2020-06-08 MED ORDER — LACTATED RINGERS IV SOLN: INTRAVENOUS | Status: DC

## 2020-06-08 MED ORDER — IOHEXOL 300 MG/ML  SOLN
100.0000 mL | Freq: Once | INTRAMUSCULAR | Status: AC | PRN
  Administered 2020-06-08: 12:00:00 80 mL

## 2020-06-08 MED ORDER — ROCURONIUM BROMIDE 10 MG/ML (PF) SYRINGE
PREFILLED_SYRINGE | INTRAVENOUS | Status: DC | PRN
  Administered 2020-06-08: 20 mg via INTRAVENOUS
  Administered 2020-06-08: 100 mg via INTRAVENOUS
  Administered 2020-06-08: 30 mg via INTRAVENOUS
  Administered 2020-06-08: 50 mg via INTRAVENOUS

## 2020-06-08 MED ORDER — GABAPENTIN 600 MG PO TABS
600.0000 mg | ORAL_TABLET | Freq: Four times a day (QID) | ORAL | Status: DC
  Administered 2020-06-08 – 2020-06-09 (×3): 600 mg via ORAL
  Filled 2020-06-08 (×3): qty 1

## 2020-06-08 MED ORDER — CHLORHEXIDINE GLUCONATE 0.12 % MT SOLN
15.0000 mL | Freq: Once | OROMUCOSAL | Status: AC
  Administered 2020-06-08: 08:00:00 15 mL via OROMUCOSAL
  Filled 2020-06-08: qty 15

## 2020-06-08 MED ORDER — LIDOCAINE 2% (20 MG/ML) 5 ML SYRINGE
INTRAMUSCULAR | Status: DC | PRN
  Administered 2020-06-08: 40 mg via INTRAVENOUS

## 2020-06-08 MED ORDER — DEXAMETHASONE SODIUM PHOSPHATE 10 MG/ML IJ SOLN
INTRAMUSCULAR | Status: DC | PRN
  Administered 2020-06-08: 10 mg via INTRAVENOUS

## 2020-06-08 MED ORDER — SUGAMMADEX SODIUM 200 MG/2ML IV SOLN
INTRAVENOUS | Status: DC | PRN
  Administered 2020-06-08: 200 mg via INTRAVENOUS

## 2020-06-08 MED ORDER — HYDROCODONE-ACETAMINOPHEN 5-325 MG PO TABS
1.0000 | ORAL_TABLET | Freq: Four times a day (QID) | ORAL | Status: DC | PRN
  Administered 2020-06-08 – 2020-06-09 (×3): 1 via ORAL
  Filled 2020-06-08 (×3): qty 1

## 2020-06-08 NOTE — H&P (Deleted)
  The note originally documented on this encounter has been moved the the encounter in which it belongs.  

## 2020-06-08 NOTE — Plan of Care (Signed)
°  Problem: Education: Goal: Required Educational Video(s) Outcome: Progressing

## 2020-06-08 NOTE — H&P (Signed)
Chief Complaint: Patient was seen in consultation today for left lower extremity DVT management  Referring Physician(s): Corrie Mckusick  Supervising Physician: Dr. Serafina Royals  Patient Status: [facility] - Out-pt  History of Present Illness: Brian Benton is a 64 y.o. 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 He has had a prior thrombectomy with iliac stent placement.   Brian Benton is familiar to IR from a left hemi-pelvic mass biopsy 03/03/20 and port-a-catheter placement 06/01/20. He met with Dr. Earleen Newport to discuss possible DVT treatment options on 04/27/20.   A duplex of the left leg was performed on 05/12/20 confirming a mix of sub-acute and chronic DVT changes of the iliac and femoral system. A CTA of the abdomen/pelvis was done on 05/18/20 which demonstrated left iliac vein/stent occlusion, non-occlusive IVC thrombus and improving appearance of the left pelvic mass/malignancy.    After another consultation with Dr. Earleen Newport on 05/27/20, the patient was in agreement to proceed with an image-guided recanalization of the left iliofemoral vein with possible inferior vena cava filter placement, possible angioplasty, thrombectomy, stent placement and filter removal.  Past Medical History:  Diagnosis Date  . Cancer ([place])    bladder  . History of bladder cancer    s/p  TURBT 07-25-2014  . Hyperlipidemia   . Mass of left hand   . 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 BLADDER SURGERY     [place]  . COLONOSCOPY    . CYSTECTOMY    . CYSTOSCOPY WITH BIOPSY N/A 07/25/2014   Procedure: CYSTOSCOPY WITH BLADDER BIOPSY AND FULGERATION;  Surgeon: [name], MD;  Location: [facility];  Service: Urology;  Laterality: N/A;  . CYSTOSCOPY WITH BIOPSY N/A 11/28/2014   Procedure: CYSTOSCOPY WITH  BLADDER BIOPSY;  Surgeon:  [name], MD;  Location: [facility];  Service: Urology;  Laterality: N/A;  . HEMORRHOID SURGERY  03/15/2012   Procedure: HEMORRHOIDECTOMY;  Surgeon: [name], MD;  Location: [facility];  Service: General;  Laterality: N/A;  . HERNIA REPAIR    . INGUINAL HERNIA REPAIR Bilateral 2006  . INTRAVASCULAR ULTRASOUND/IVUS N/A 03/13/2020   Procedure: Intravascular Ultrasound/IVUS;  Surgeon: Elam Dutch, MD;  Location: [place] [facility];  Service: Cardiovascular;  Laterality: N/A;  . IR IMAGING GUIDED PORT INSERTION  06/01/2020  . IR RADIOLOGIST EVAL & MGMT  04/30/2020  . IR RADIOLOGIST EVAL & MGMT  05/27/2020  . IVC VENOGRAPHY N/A 03/13/2020   Procedure: IVC Venography;  Surgeon: Elam Dutch, MD;  Location: [place] [facility];  Service: Cardiovascular;  Laterality: N/A;  . LOWER EXTREMITY VENOGRAPHY Left 03/13/2020   Procedure: LOWER EXTREMITY VENOGRAPHY;  Surgeon: Elam Dutch, MD;  Location: [place] [facility];  Service: Cardiovascular;  Laterality: Left;  Marland Kitchen MASS EXCISION Left 11/12/2018   Procedure: EXCISION MASS LEFT HAND;  Surgeon: Daryll Brod, MD;  Location: [place];  Service: Orthopedics;  Laterality: Left;  FAB  . PERIPHERAL VASCULAR INTERVENTION  03/13/2020   Procedure: PERIPHERAL VASCULAR INTERVENTION;  Surgeon: Elam Dutch, MD;  Location: [place] [facility];  Service: Cardiovascular;;  . PERIPHERAL VASCULAR THROMBECTOMY N/A 03/13/2020   Procedure: PERIPHERAL VASCULAR THROMBECTOMY;  Surgeon: Elam Dutch, MD;  Location: [place] [facility];  Service: Cardiovascular;  Laterality: N/A;  . RADIOLOGY WITH ANESTHESIA N/A 02/20/2020  Procedure: MRI LUMBAR W/O CONTRAST  WITH ANESTHESIA;  Surgeon: Radiologist, Medication, MD;  Location: [place];  Service: Radiology;  Laterality: N/A;  . TESTICLE SURGERY  2004   Ruptured Undescended Right testicle   . TRANSURETHRAL RESECTION OF BLADDER TUMOR WITH GYRUS (TURBT-GYRUS) N/A 05/23/2014    Procedure: TRANSURETHRAL RESECTION OF BLADDER TUMOR WITH GYRUS (TURBT-GYRUS);  Surgeon: [name], MD;  Location: [facility];  Service: Urology;  Laterality: N/A;  . WISDOM TOOTH EXTRACTION      Allergies: Patient has no known allergies.  Medications: Prior to Admission medications   Medication Sig Start Date End Date Taking? Authorizing Provider  ASPIRIN ADULT LOW STRENGTH 81 MG EC tablet TAKE 1 TABLET BY MOUTH EVERY DAY Patient taking differently: Take 81 mg by mouth daily. 10/03/19  Yes Patwardhan, Manish J, MD  Calcium Carbonate-Vit D-Min (CALTRATE PLUS PO) Take 1 tablet by mouth daily.   Yes [provider]  enoxaparin (LOVENOX) 80 MG/0.8ML injection Inject 0.8 mLs (80 mg total) into the skin every 12 (twelve) hours. 05/20/20  Yes Volanda Napoleon, MD  fentaNYL (DURAGESIC) 50 MCG/HR PLACE 1 PATCH ONTO THE SKIN EVERY 3 (THREE) DAYS. 06/01/20  Yes Volanda Napoleon, MD  gabapentin (NEURONTIN) 600 MG tablet Take 1 tablet (600 mg total) by mouth in the morning, at noon, in the evening, and at bedtime. 04/13/20  Yes Ennever, Rudell Cobb, MD  LORazepam (ATIVAN) 0.5 MG tablet Take 1 tablet (0.5 mg total) by mouth every 6 (six) hours as needed (Nausea or vomiting). 06/02/20  Yes Volanda Napoleon, MD  meloxicam (MOBIC) 15 MG tablet Take 1 tablet (15 mg total) by mouth daily. 03/20/20  Yes Ennever, Rudell Cobb, MD  Multiple Vitamins-Minerals (MULTIVITAMIN WITH MINERALS) tablet Take 1 tablet by mouth daily.   Yes [provider]  Oxycodone HCl 10 MG TABS TAKE 1 TABLET (10 MG TOTAL) BY MOUTH EVERY 4 (FOUR) HOURS AS NEEDED. Patient taking differently: Take 10 mg by mouth every 6 (six) hours as needed (pain). 05/01/20  Yes Ennever, Rudell Cobb, MD  sodium chloride (OCEAN) 0.65 % SOLN nasal spray Place 1 spray into both nostrils daily as needed for congestion (Allergies).   Yes [provider]  zolpidem (AMBIEN) 10 MG tablet TAKE 1 TABLET (10 MG TOTAL) BY MOUTH AT BEDTIME  AS NEEDED FOR SLEEP. 06/01/20 07/01/20 Yes Ennever, Rudell Cobb, MD  lidocaine-prilocaine (EMLA) cream Apply to affected area once Patient taking differently: Apply 1 application topically once. Apply to affected area once 06/02/20   Volanda Napoleon, MD     Family History  Problem Relation Age of Onset  . Cancer Mother        leukemia  . Diabetes Brother     Social History   Socioeconomic History  . Marital status: Married    Spouse name: Not on file  . Number of children: 2  . Years of education: Not on file  . Highest education level: Not on file  Occupational History  . Not on file  Tobacco Use  . Smoking status: Light Tobacco Smoker    Packs/day: 0.25    Years: 30.00    Pack years: 7.50    Types: Cigarettes  . Smokeless tobacco: Never Used  Vaping Use  . Vaping Use: Never used  Substance and Sexual Activity  . Alcohol use: Not Currently    Comment: many 4 times a years a social drink  . Drug use: No  . Sexual activity: Not on  file  Other Topics Concern  . Not on file  Social History Narrative  . Not on file   Social Determinants of Health   Financial Resource Strain: Not on file  Food Insecurity: Not on file  Transportation Needs: Not on file  Physical Activity: Not on file  Stress: Not on file  Social Connections: Not on file    Review of Systems: A 12 point ROS discussed and pertinent positives are indicated in the HPI above.  All other systems are negative.  Review of Systems  Constitutional: Negative for appetite change and fatigue.  Respiratory: Negative for cough and shortness of breath.   Cardiovascular: Positive for leg swelling. Negative for chest pain.  Gastrointestinal: Negative for abdominal pain, diarrhea, nausea and vomiting.  Musculoskeletal: Negative for back pain.  Neurological: Negative for dizziness and headaches.    Vital Signs: Temp: 98.2, BP 141/64, HR 72, RR 17, O2 Sats 98% on Room Air  Physical Exam Constitutional:       General: He is not in acute distress. HENT:     Mouth/Throat:     Mouth: Mucous membranes are moist.     Pharynx: Oropharynx is clear.  Cardiovascular:     Rate and Rhythm: Normal rate and regular rhythm.     Pulses: Normal pulses.     Heart sounds: Normal heart sounds.  Pulmonary:     Effort: Pulmonary effort is normal.     Breath sounds: Normal breath sounds.  Abdominal:     General: Bowel sounds are normal.     Palpations: Abdomen is soft.  Musculoskeletal:        General: Normal range of motion.     Cervical back: Normal range of motion.     Left lower leg: Edema present.  Skin:    General: Skin is warm and dry.     Comments: Left lower back 50 mcg fentanyl patch   Neurological:     Mental Status: He is alert and oriented to person, place, and time.  Psychiatric:        Mood and Affect: Mood normal.        Behavior: Behavior normal.        Thought Content: Thought content normal.        Judgment: Judgment normal.     Imaging: MR Lumbar Spine W Wo Contrast  Result Date: 05/15/2020 CLINICAL DATA:  Metastatic bladder cell carcinoma. Evaluate response to chemo and radiation. EXAM: MRI LUMBAR SPINE WITHOUT AND WITH CONTRAST TECHNIQUE: Multiplanar and multiecho pulse sequences of the lumbar spine were obtained without and with intravenous contrast. CONTRAST:  69m GADAVIST GADOBUTROL 1 MMOL/ML IV SOLN COMPARISON:  Lumbar MRI 02/20/2020 FINDINGS: Segmentation:  Normal Alignment:  Mild retrolisthesis L2-3 and L3-4 Vertebrae: Progressive tumor invasion of the left sacrum. Mild involvement of the lateral L5 vertebral body and left L5 transverse process appears unchanged. No new bony involvement. No fracture. Conus medullaris and cauda equina: Conus extends to the L2 level. Conus and cauda equina appear normal. Paraspinal and other soft tissues: Left iliac lymph node mass appears smaller. There is more cystic change and decrease in size. This encases the left sciatic nerve. Adjacent bony  invasion of the left sacral ala has progressed. Involvement of the left iliacus muscle unchanged. Tumor in the left psoas muscle improved in the interval. Disc levels: L1-2: Negative L2-3: Mild retrolisthesis. Mild disc and facet degeneration. Mild subarticular stenosis bilaterally L3-4: Mild disc and mild facet degeneration. Mild subarticular stenosis bilaterally L4-5: Disc  degeneration with disc bulging. Shallow left foraminal disc protrusion with annular fissure unchanged from the prior study bilateral facet degeneration and mild subarticular stenosis bilaterally L5-S1: Negative IMPRESSION: Metastatic lymph node mass in the left iliac lymph nodes has decreased in size. Decreased involvement of the left psoas muscle. No change in involvement of the left iliacus muscle Bony involvement involving the left sacrum has progressed. No change in minor involvement of the left lateral L5 vertebral body and transverse process. No new metastatic deposits. Electronically Signed   By: Franchot Gallo M.D.   On: 05/15/2020 14:01   NM PET Image Restag (PS) Skull Base To Thigh  Result Date: 06/01/2020 CLINICAL DATA:  Subsequent treatment strategy for urologic carcinoma. Assess response to treatment. EXAM: NUCLEAR MEDICINE PET SKULL BASE TO THIGH TECHNIQUE: 8.1 mCi F-18 FDG was injected intravenously. Full-ring PET imaging was performed from the skull base to thigh after the radiotracer. CT data was obtained and used for attenuation correction and anatomic localization. Fasting blood glucose: 98 mg/dl COMPARISON:  CT 05/18/2020, 03/02/2020 FINDINGS: Mediastinal blood pool activity: SUV max 2.0 Liver activity: SUV max 2.7 NECK: No hypermetabolic lymph nodes in the neck. Incidental CT findings: none CHEST: No hypermetabolic mediastinal or hilar nodes. No suspicious pulmonary nodules on the CT scan. Incidental CT findings: Paraseptal emphysema and hyperinflated lungs. ABDOMEN/PELVIS: Hypermetabolic mass in the LEFT paraspinal  tissue anterior to the sacrum with SUV max equal 7.6. Ill-defined mass at this level measures 2.6 by 2.0 cm. This is site of previously large retroperitoneal mass. A second focus of activity just anterior to the larger mass and adjacent to the venous stent SUV max equal 5.0. Focal activity in the RIGHT upper quadrant associated with the colon with SUV max equal 5.9 on image 135. On the CT portion exam there is mild bowel wall thickening of the colon at this level. No abnormal activity liver. No additional adenopathy. Diffuse activity in stomach is favored physiologic Incidental CT findings: LEFT common iliac vein stent SKELETON: Again noted erosion of the anterior margin of the LEFT sacrum adjacent to hypermetabolic mass. No additional evidence skeletal metastasis. Incidental CT findings: none IMPRESSION: Hypermetabolic tissue in the LEFT retroperitoneum adjacent to the LEFT sacrum and iliac vein stent consistent residual with residual carcinoma. Mass is reduced significantly in size from CT 02/26/2020. Focal activity in the colon at the level of the hepatic flexure with associated bowel wall thickening. This is favored unrelated to the urologic carcinoma but could represent colorectal neoplasm. Recommend endoscopy if not current for screening. Electronically Signed   By: Suzy Bouchard M.D.   On: 06/01/2020 08:21   US Venous Img Lower Unilateral Left (DVT)  Result Date: 05/12/2020 CLINICAL DATA:  64 year old male with a history of left-sided DVT, history of malignancy of the left pelvis EXAM: LEFT LOWER EXTREMITY VENOUS DOPPLER ULTRASOUND TECHNIQUE: Gray-scale sonography with graded compression, as well as color Doppler and duplex ultrasound were performed to evaluate the lower extremity deep venous systems from the level of the common femoral vein and including the common femoral, femoral, profunda femoral, popliteal and calf veins including the posterior tibial, peroneal and gastrocnemius veins when  visible. The superficial great saphenous vein was also interrogated. Spectral Doppler was utilized to evaluate flow at rest and with distal augmentation maneuvers in the common femoral, femoral and popliteal veins. COMPARISON:  None. FINDINGS: Contralateral Common Femoral Vein: No evidence of thrombus. Normal compressibility, respiratory phasicity and response to augmentation. Common Femoral Vein: Stent changes of the left common  femoral vein, with no significant flow and incompressible vein. Saphenofemoral Junction: Partially compressible junction, with flow maintained in the saphenous vein. Profunda Femoral Vein: Flow maintained within the profunda, with partially compressible junction. Femoral Vein: Partially compressible femoral vein with minimal flow maintained. Popliteal Vein: Popliteal vein partially compressible with flow maintained. Calf Veins: Posterior tibial veins with flow maintained. Peroneal veins with flow maintained. Superficial Great Saphenous Vein: No evidence of thrombus. Normal compressibility and flow on color Doppler imaging. Other Findings:  None. IMPRESSION: Sonographic survey of the left lower extremity demonstrates stent changes of the common femoral vein, with persisting DVT of the left common femoral vein stent, occlusive. Positive duplex for DVT at the SFJ, junction of the profunda vein, and through the femoral vein segment. Electronically Signed   By: Corrie Mckusick D.O.   On: 05/12/2020 12:58   ECHOCARDIOGRAM COMPLETE  Result Date: 05/28/2020    ECHOCARDIOGRAM REPORT   Patient Name:   MARE LUDTKE Date of Exam: 05/28/2020 Medical Rec #:  [id number]       Height:       72.0 in Accession #:    [id number]      Weight:       163.0 lb Date of Birth:  1955/11/14       BSA:          1.953 m Patient Age:    66 years        BP:           131/78 mmHg Patient Gender: M               HR:           65 bpm. Exam Location:  Outpatient Procedure: 2D Echo, 3D Echo, Cardiac Doppler, Color Doppler  and Strain Analysis Indications:    Z51.11 Encounter for antineoplastic chemotheraphy  History:        Patient has no prior history of Echocardiogram examinations.                 Risk Factors:Hypertension, Current Smoker and Dyslipidemia.                 Cancer.  Sonographer:    Roseanna Rainbow Referring Phys: [place] Comments: Technically difficult study due to poor echo windows and suboptimal parasternal window. Global longitudinal strain was attempted. IMPRESSIONS  1. Left ventricular ejection fraction, by estimation, is 50 to 55%. The left ventricle has low normal function. The left ventricle has no regional wall motion abnormalities. Left ventricular diastolic parameters were normal.  2. Right ventricular systolic function is normal. The right ventricular size is normal.  3. The mitral valve is grossly normal. Trivial mitral valve regurgitation.  4. The aortic valve is grossly normal. Aortic valve regurgitation is not visualized. No aortic stenosis is present. FINDINGS  Left Ventricle: Left ventricular ejection fraction, by estimation, is 50 to 55%. The left ventricle has low normal function. The left ventricle has no regional wall motion abnormalities. The left ventricular internal cavity size was normal in size. There is no left ventricular hypertrophy. Left ventricular diastolic parameters were normal. Right Ventricle: The right ventricular size is normal. No increase in right ventricular wall thickness. Right ventricular systolic function is normal. Left Atrium: Left atrial size was normal in size. Right Atrium: Right atrial size was normal in size. Pericardium: There is no evidence of pericardial effusion. Mitral Valve: The mitral valve is grossly normal. Trivial mitral valve regurgitation. Tricuspid Valve: The tricuspid  valve is grossly normal. Tricuspid valve regurgitation is trivial. Aortic Valve: The aortic valve is grossly normal. Aortic valve regurgitation is not visualized. No  aortic stenosis is present. Pulmonic Valve: The pulmonic valve was grossly normal. Pulmonic valve regurgitation is not visualized. Aorta: The aortic root and ascending aorta are structurally normal, with no evidence of dilitation. IAS/Shunts: The atrial septum is grossly normal.  LEFT VENTRICLE PLAX 2D LVIDd:         4.55 cm      Diastology LVIDs:         3.20 cm      LV e' medial:    7.18 cm/s LV PW:         1.40 cm      LV E/e' medial:  7.6 LV IVS:        1.10 cm      LV e' lateral:   7.94 cm/s                             LV E/e' lateral: 6.9  LV Volumes (MOD)            2D Longitudinal Strain LV vol d, MOD A2C: 108.0 ml 2D Strain GLS Avg:     -20.7 % LV vol d, MOD A4C: 119.0 ml LV vol s, MOD A2C: 44.8 ml LV vol s, MOD A4C: 55.7 ml LV SV MOD A2C:     63.2 ml LV SV MOD A4C:     119.0 ml LV SV MOD BP:      63.0 ml RIGHT VENTRICLE             IVC RV S prime:     10.20 cm/s  IVC diam: 1.50 cm TAPSE (M-mode): 2.8 cm LEFT ATRIUM             Index       RIGHT ATRIUM           Index LA diam:        4.30 cm 2.20 cm/m  RA Area:     15.50 cm LA Vol (A2C):   60.2 ml 30.82 ml/m RA Volume:   41.10 ml  21.04 ml/m LA Vol (A4C):   34.1 ml 17.46 ml/m LA Biplane Vol: 44.2 ml 22.63 ml/m  AORTIC VALVE LVOT Vmax:   105.00 cm/s LVOT Vmean:  73.800 cm/s LVOT VTI:    0.199 m  AORTA Ao Root diam: 3.80 cm Ao Asc diam:  3.60 cm MITRAL VALVE MV Area (PHT): 2.99 cm    SHUNTS MV Decel Time: 254 msec    Systemic VTI: 0.20 m MV E velocity: 54.80 cm/s MV A velocity: 65.10 cm/s MV E/A ratio:  0.84 Mertie Moores MD Electronically signed by Mertie Moores MD Signature Date/Time: 05/28/2020/10:25:30 AM    Final    IR IMAGING GUIDED PORT INSERTION  Result Date: 06/01/2020 INDICATION: History of metastatic bladder cancer. In need of durable intravenous access for chemotherapy administration. EXAM: IMPLANTED PORT A CATH PLACEMENT WITH ULTRASOUND AND FLUOROSCOPIC GUIDANCE COMPARISON:  PET-CT-03/19/2020 MEDICATIONS: Ancef 2 gm IV; The antibiotic  was administered within an appropriate time interval prior to skin puncture. ANESTHESIA/SEDATION: Moderate (conscious) sedation was employed during this procedure. A total of Versed 2 mg and Fentanyl 100 mcg was administered intravenously. Moderate Sedation Time: 25 minutes. The patient's level of consciousness and vital signs were monitored continuously by radiology nursing throughout the procedure under my direct supervision. CONTRAST:  None FLUOROSCOPY TIME:  18 seconds (3 mGy) COMPLICATIONS: None immediate. PROCEDURE: The procedure, risks, benefits, and alternatives were explained to the patient. Questions regarding the procedure were encouraged and answered. The patient understands and consents to the procedure. The right neck and chest were prepped with chlorhexidine in a sterile fashion, and a sterile drape was applied covering the operative field. Maximum barrier sterile technique with sterile gowns and gloves were used for the procedure. A timeout was performed prior to the initiation of the procedure. Local anesthesia was provided with 1% lidocaine with epinephrine. After creating a small venotomy incision, a micropuncture kit was utilized to access the internal jugular vein. Real-time ultrasound guidance was utilized for vascular access including the acquisition of a permanent ultrasound image documenting patency of the accessed vessel. The microwire was utilized to measure appropriate catheter length. A subcutaneous port pocket was then created along the upper chest wall utilizing a combination of sharp and blunt dissection. The pocket was irrigated with sterile saline. A single lumen "Slim" sized power injectable port was chosen for placement. The 8 Fr catheter was tunneled from the port pocket site to the venotomy incision. The port was placed in the pocket. The external catheter was trimmed to appropriate length. At the venotomy, an 8 Fr peel-away sheath was placed over a guidewire under fluoroscopic  guidance. The catheter was then placed through the sheath and the sheath was removed. Final catheter positioning was confirmed and documented with a fluoroscopic spot radiograph. The port was accessed with a Huber needle, aspirated and flushed with heparinized saline. The venotomy site was closed with an interrupted 4-0 Vicryl suture. The port pocket incision was closed with interrupted 2-0 Vicryl suture. Dermabond and Steri-strips were applied to both incisions. Dressings were applied. The patient tolerated the procedure well without immediate post procedural complication. FINDINGS: After catheter placement, the tip lies within the superior cavoatrial junction. The catheter aspirates and flushes normally and is ready for immediate use. IMPRESSION: Successful placement of a right internal jugular approach power injectable Port-A-Cath. The catheter is ready for immediate use. Electronically Signed   By: Sandi Mariscal M.D.   On: 06/01/2020 15:57   IR Radiologist Eval & Mgmt  Result Date: 05/27/2020 Please refer to notes tab for details about interventional procedure. (Op Note)  CT Angio Abd/Pel w/ and/or w/o  Result Date: 05/18/2020 CLINICAL DATA:  64 year old male with history of left lower extremity deep vein thrombosis secondary to extrinsic compression from metastatic pelvic malignancy presenting with post thrombotic syndrome after left ileo femoral thrombectomy and stent placement. EXAM: CTA ABDOMEN AND PELVIS WITH CONTRAST TECHNIQUE: Multidetector CT imaging of the abdomen and pelvis was performed using the standard protocol during and after bolus administration of intravenous contrast. Multiplanar reconstructed images and MIPs were obtained and reviewed to evaluate the vascular anatomy. CONTRAST:  132m OMNIPAQUE IOHEXOL 350 MG/ML SOLN COMPARISON:  03/02/2020, 03/20/2019 FINDINGS: VASCULAR IVC: The suprarenal inferior vena cava is patent and normal in caliber. Approximately 4.5 cm inferior to the level  of the left renal vein is nonocclusive thrombus extending from the left iliac vein into the inferior vena cava, eccentricly favoring the left side of the IVC. Renal veins: Widely patent. Right iliofemoral veins: Normal caliber and patent throughout. Left iliofemoral veins: Indwelling stents throughout the left common iliac, external iliac, and common femoral vein terminating just central to the saphenofemoral junction. This stents are occluded throughout. In the proximal external iliac vein, there is a free-floating wall of the indwelling stent, best visualized on sagittal series  4, images 99-103. The central superficial and profunda femoral veins appear patent, slightly enlarged compared to the right, likely secondary to congestion. There are no appreciable major intrapelvic or ascending lumbar collateral veins appreciated. Portal system: Widely patent common normal caliber. Hepatic veins: Widely patent. Arterial system: Scattered atherosclerotic calcifications throughout the abdominal aorta, most prominent in the infrarenal portion with mild fusiform ectasia of the infrarenal abdominal aorta measuring up to 2.6 cm. The inflow proximal outflow vessels appear patent. No evidence of visceral ostial occlusion. Review of the MIP images confirms the above findings. NON-VASCULAR Lower chest: Similar appearing linear atelectasis in the bilateral lower lobes. Moderate centrilobular emphysema. The heart is normal in size. No pericardial effusion. Hepatobiliary: No focal liver abnormality is seen. No gallstones, gallbladder wall thickening, or biliary dilatation. Pancreas: Unremarkable. No pancreatic ductal dilatation or surrounding inflammatory changes. Spleen: Normal in size without focal abnormality. Adrenals/Urinary Tract: Adrenal glands are unremarkable. Simple appearing multifocal simple renal cysts, the largest about the left inferior pole, partially exophytic medially, measuring up to 2.5 cm. Kidneys are otherwise  normal, without renal calculi, focal lesion, or hydronephrosis. Bladder is nondistended. Stomach/Bowel: Stomach is within normal limits. Appendix appears normal. Large stool burden. No evidence of bowel wall thickening, distention, or inflammatory changes. Lymphatic: No abdominopelvic adenopathy. Reproductive: Prostate is unremarkable. Other: Continued decreased size of previously visualized left lower paraspinal mass now measuring up to approximately 5.6 x 2.6 cm in maximum axial dimension, approximately 6.7 x 3.4 cm at the same location on 03/19/2020 PET-CT by my measurements. There is similar appearing associated cortical destruction of the superior, anterior sacral ala at this level. No ascites. Musculoskeletal: No acute abnormality. IMPRESSION: VASCULAR Complete occlusion of the indwelling left iliofemoral venous stent with extension of nonocclusive thrombus into the infrarenal inferior vena cava. Aortic Atherosclerosis (ICD10-I70.0). NON-VASCULAR Suggestion favorable treatment response with continued decreased size of the previously visualized left lower paraspinal mass. Unchanged associated osseous destruction of the anterior aspect superior left sacral ala. Ruthann Cancer, MD [facility] Radiology Electronically Signed   By: Ruthann Cancer MD   On: 05/18/2020 09:42    Labs:  CBC: Recent Labs    05/20/20 0807 06/01/20 1217 06/02/20 0815 06/08/20 0725  WBC 5.1 4.9 5.6 19.1*  HGB 11.6* 11.6* 11.1* 10.4*  HCT 35.6* 36.2* 33.3* 30.4*  PLT 287 190 188 111*    COAGS: Recent Labs    03/02/20 0105 06/01/20 1217 06/08/20 0725  INR 1.0 1.1 0.9    BMP: Recent Labs    03/02/20 0105 03/03/20 0733 03/04/20 0337 03/11/20 0813 03/18/20 0858 04/01/20 0937 04/08/20 0846 05/18/20 0835 05/20/20 0807 06/02/20 0815  NA 135 134* 134* 134*   < > 135 135  --  139 138  K 5.1 4.8 4.1 4.8   < > 4.2 4.4  --  3.9 3.9  CL 98 98 100 98   < > 102 103  --   104 105  CO2 _0 32   < > 27 27  --  29 28  GLUCOSE 119* 121* 131* 94   < > 102* 96  --  117* 83  BUN _1 < > 14 12  --  16 15  CALCIUM 9.3 9.3 8.7* 10.2   < > 9.5 9.3  --  10.2 10.2  CREATININE 0.94 0.74 0.69 0.72   < > 0.61 0.66 0.80 0.89 0.91  GFRNONAA >60 >60 >60 >60   < > >  60 >60  --  >60 >60  GFRAA >60 >60 >60 >60  --   --   --   --   --   --    < > = values in this interval not displayed.    LIVER FUNCTION TESTS: Recent Labs    04/01/20 0937 04/08/20 0846 05/20/20 0807 06/02/20 0815  BILITOT 0.3 0.4 0.3 0.3  AST 12* 11* 15 13*  ALT _0 ALKPHOS 82 77 42 37*  PROT 7.0 6.8 7.0 6.8  ALBUMIN 3.7 3.3* 4.1 4.0    TUMOR MARKERS: No results for input(s): AFPTM, CEA, CA199, CHROMGRNA in the last 8760 hours.  Assessment and Plan:  Metastatic bladder cancer; severe post-thrombotic syndrome/left lower extremity DVT: Percell Miller T. 77, 64 year old male, presents today to the [facility] department for an image-guided recanalization of the left iliofemoral vein with possible inferior vena cava filter placement, possible angioplasty, thrombectomy, stent placement and filter removal. This procedure will be done under general anesthesia and Mr. Ocallaghan will be admitted for overnight observation.   Risks and benefits of an image-guided recanalization of the left iliofemoral vein with possible inferior vena cava filter placement, possible angioplasty, thrombectomy, stent placement and filter removal were discussed with the patient including, but not limited to bleeding, infection, contrast induced renal failure, filter fracture or migration which can lead to emergency surgery or even death, strut penetration with damage or irritation to adjacent structures and caval thrombosis; possible vascular injury.   This interventional procedure involves the use of X-rays and because of the nature of the planned procedure, it is possible that we will have  prolonged use of X-ray fluoroscopy.  Potential radiation risks to you include (but are not limited to) the following: - A slightly elevated risk for cancer  several years later in life. This risk is typically less than 0.5% percent. This risk is low in comparison to the normal incidence of human cancer, which is 33% for women and 50% for men according to the [employer]. - Radiation induced injury can include skin redness, resembling a rash, tissue breakdown / ulcers and hair loss (which can be temporary or permanent).   The likelihood of either of these occurring depends on the difficulty of the procedure and whether you are sensitive to radiation due to previous procedures, disease, or genetic conditions.   IF your procedure requires a prolonged use of radiation, you will be notified and given written instructions for further action.  It is your responsibility to monitor the irradiated area for the 2 weeks following the procedure and to notify your physician if you are concerned that you have suffered a radiation induced injury.    All of the patient's questions were answered, patient is agreeable to proceed.  Brian Benton has been  NPO. Labs and vitals have been reviewed. He takes lovenox and aspiring daily.   Consent signed and in chart.  Thank you for this interesting consult.  I greatly enjoyed meeting Brian Benton and look forward to participating in their care.  A copy of this report was sent to the requesting provider on this date.  Electronically Signed: Soyla Dryer, AGACNP-BC [phone] 06/08/2020, 8:16 AM   I spent a total of 40 Minutes    in face to face in clinical consultation, greater than 50% of which was counseling/coordinating care for severe post-thrombotic syndrome; left lower extremity DVT

## 2020-06-08 NOTE — Procedures (Signed)
Interventional Radiology Procedure Note  Procedure:   US guided right IJ access US guided left GSV access IVC and left iliac venogram IVUS, left iliac vein Temporary IVC filter placement and retrieval PTA and stenting of occluded left iliac stent system, with overlapping 55mm x 6cm wallstent (proximal) and 34mm x 9cm wallstent (distal)  Anesthesia: General  Operators: Dr. Ruthann Cancer Dr. Corrie Mckusick  Complications: None  Recommendations:  - 23 observation overnight - Stable to PACU - local wound care for right IJ and left femoral access sites - continue TED hose bilateral - continue SCD's bilateral for remainder of today, until ambulating - remove foley.  Bladder scan if difficulty urinating.  Call if significant residual - Do not submerge for 7 days - Continue lovenox dosing, weight-based, BID dosing - continue anti-platelet - Follow up with Dr. Earleen Benton in clinic in 2 weeks  Signed,  Brian Benton. Brian Newport, DO

## 2020-06-08 NOTE — Anesthesia Procedure Notes (Signed)
Procedure Name: Intubation Date/Time: 06/08/2020 8:47 AM Performed by: Harden Mo, CRNA Pre-anesthesia Checklist: Patient identified, Emergency Drugs available, Suction available and Patient being monitored Patient Re-evaluated:Patient Re-evaluated prior to induction Oxygen Delivery Method: Circle System Utilized Preoxygenation: Pre-oxygenation with 100% oxygen Induction Type: IV induction Ventilation: Mask ventilation without difficulty Laryngoscope Size: Miller and 2 Grade View: Grade I Tube type: Oral Tube size: 7.5 mm Number of attempts: 1 Airway Equipment and Method: Stylet and Oral airway Placement Confirmation: ETT inserted through vocal cords under direct vision,  positive ETCO2 and breath sounds checked- equal and bilateral Secured at: 23 cm Tube secured with: Tape Dental Injury: Teeth and Oropharynx as per pre-operative assessment

## 2020-06-08 NOTE — Transfer of Care (Signed)
Immediate Anesthesia Transfer of Care Note  Patient: Brian Benton  Procedure(s) Performed: IR WITH ANESTHESIA VENOGRAM (N/A )  Patient Location: PACU  Anesthesia Type:General  Level of Consciousness: awake, alert  and oriented  Airway & Oxygen Therapy: Patient Spontanous Breathing  Post-op Assessment: Report given to RN, Post -op Vital signs reviewed and stable and Patient moving all extremities X 4  Post vital signs: Reviewed and stable  Last Vitals:  Vitals Value Taken Time  BP 115/62 06/08/20 1246  Temp    Pulse 75 06/08/20 1249  Resp 13 06/08/20 1249  SpO2 95 % 06/08/20 1249  Vitals shown include unvalidated device data.  Last Pain:  Vitals:   06/08/20 0636  TempSrc: Oral         Complications: No complications documented.

## 2020-06-08 NOTE — Sedation Documentation (Signed)
Procedure complete. Transported to PACU via stretcher by CRNA Sarah.  Bedside handoff completed, report given to [facility], PACU RN.  Left DP/ PT distal pulses palpated. Dressings on right IJ venous access and left femoral venous access are clean, dry, and intact.

## 2020-06-08 NOTE — Sedation Documentation (Signed)
ACT result [id number]  Call back confirmed/ verified with Dr. Loreta Ave.   ACT recheck in 30 minutes.

## 2020-06-08 NOTE — Progress Notes (Addendum)
ANTICOAGULATION CONSULT NOTE - Initial Consult  Pharmacy Consult for Enoxaparin Indication: DVT  No Known Allergies  Patient Measurements: Height: 6' (182.9 cm) Weight: 74.8 kg (165 lb) IBW/kg (Calculated) : 77.6  Vital Signs: Temp: 97.7 F (36.5 C) (12/27 1245) Temp Source: Oral (12/27 0636) BP: 122/65 (12/27 1415) Pulse Rate: 70 (12/27 1415)  Labs: Recent Labs    06/08/20 0725  HGB 10.4*  HCT 30.4*  PLT 111*  LABPROT 11.9  INR 0.9  CREATININE 0.84    Estimated Creatinine Clearance: 92.8 mL/min (by C-G formula based on SCr of 0.84 mg/dL).   Medical History: Past Medical History:  Diagnosis Date  . Cancer (HCC)    bladder  . History of bladder cancer    s/p  TURBT 07-25-2014  . Hyperlipidemia   . Mass of left hand   . 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 (HCC) 03/06/2020    Medications:  Medications Prior to Admission  Medication Sig Dispense Refill Last Dose  . ASPIRIN ADULT LOW STRENGTH 81 MG EC tablet TAKE 1 TABLET BY MOUTH EVERY DAY (Patient taking differently: Take 81 mg by mouth daily.) 30 tablet 11   . Calcium Carbonate-Vit D-Min (CALTRATE PLUS PO) Take 1 tablet by mouth daily.     Marland Kitchen enoxaparin (LOVENOX) 80 MG/0.8ML injection Inject 0.8 mLs (80 mg total) into the skin every 12 (twelve) hours. 19.2 mL 6   . fentaNYL (DURAGESIC) 50 MCG/HR PLACE 1 PATCH ONTO THE SKIN EVERY 3 (THREE) DAYS. 10 patch 0   . gabapentin (NEURONTIN) 600 MG tablet Take 1 tablet (600 mg total) by mouth in the morning, at noon, in the evening, and at bedtime. 120 tablet 3   . lidocaine-prilocaine (EMLA) cream Apply to affected area once (Patient taking differently: Apply 1 application topically once. Apply to affected area once) 30 g 3   . LORazepam (ATIVAN) 0.5 MG tablet Take 1 tablet (0.5 mg total) by mouth every 6 (six) hours as needed (Nausea or vomiting). 30 tablet 0   . meloxicam (MOBIC) 15 MG tablet Take 1 tablet (15 mg total) by mouth daily. 30 tablet 3   . Multiple  Vitamins-Minerals (MULTIVITAMIN WITH MINERALS) tablet Take 1 tablet by mouth daily.     . Oxycodone HCl 10 MG TABS TAKE 1 TABLET (10 MG TOTAL) BY MOUTH EVERY 4 (FOUR) HOURS AS NEEDED. (Patient taking differently: Take 10 mg by mouth every 6 (six) hours as needed (pain).) 90 tablet 0   . sodium chloride (OCEAN) 0.65 % SOLN nasal spray Place 1 spray into both nostrils daily as needed for congestion (Allergies).     . zolpidem (AMBIEN) 10 MG tablet TAKE 1 TABLET (10 MG TOTAL) BY MOUTH AT BEDTIME AS NEEDED FOR SLEEP. 30 tablet 0     Assessment: 64 yo male presented on 06/08/2020 for L iliocaval stent recanalization with thrombectomy and stent placement. Patient has metastatic bladder cancer and left lower extremity DVT. Patient is on enoxaparin 80mg  q12h prior to admission administered at 0800 and 2000. Enoxaparin 80mg  was given on at 1230 on 12/27. Pharmacy consulted to resume enoxaparin with next dose tonight.   RN called with noting bleeding from both access sites - Dr. aware and discussed plan to continue with enoxaparin dose tonight.  Goal of Therapy:  Anti-Xa level 0.6-1 units/ml 4hrs after LMWH dose given Monitor platelets by anticoagulation protocol: Yes   Plan:  Resume enoxaparin 80mg  tonight at 2200 (discussed with Dr. 1/28) and resume prior to admission 0800 and  2000 schedule tomorrow Pharmacy will sign off - please reconsult if we can be of assistance.  Cristela Felt, PharmD Clinical Pharmacist  06/08/2020,2:51 PM

## 2020-06-08 NOTE — Progress Notes (Signed)
R IJ dressing saturated. Pressure dressing reapplied. Site continues to ooze blood.

## 2020-06-08 NOTE — Anesthesia Postprocedure Evaluation (Signed)
Anesthesia Post Note  Patient: JENNIFER HOLLAND  Procedure(s) Performed: IR WITH ANESTHESIA VENOGRAM (N/A )     Patient location during evaluation: PACU Anesthesia Type: General Level of consciousness: awake and alert, oriented and patient cooperative Pain management: pain level controlled Vital Signs Assessment: post-procedure vital signs reviewed and stable Respiratory status: spontaneous breathing, nonlabored ventilation and respiratory function stable Cardiovascular status: blood pressure returned to baseline and stable Postop Assessment: no apparent nausea or vomiting Anesthetic complications: no   No complications documented.  Last Vitals:  Vitals:   06/08/20 1400 06/08/20 1415  BP: 122/72 122/65  Pulse: 70 70  Resp: 13 15  Temp:    SpO2: 97% 97%    Last Pain:  Vitals:   06/08/20 1415  TempSrc:   PainSc: 0-No pain                 Pervis Hocking

## 2020-06-08 NOTE — Progress Notes (Signed)
Attempted to call daughter Burnett Harry, number on file says it has been disconnected. Volunteers walked the hall for [place] family with no answer.  Hermina Barters, RN

## 2020-06-09 ENCOUNTER — Other Ambulatory Visit (HOSPITAL_COMMUNITY): Payer: Self-pay | Admitting: Interventional Radiology

## 2020-06-09 ENCOUNTER — Encounter (HOSPITAL_COMMUNITY): Payer: Self-pay

## 2020-06-09 ENCOUNTER — Telehealth (HOSPITAL_COMMUNITY): Payer: Self-pay

## 2020-06-09 DIAGNOSIS — I82402 Acute embolism and thrombosis of unspecified deep veins of left lower extremity: Secondary | ICD-10-CM | POA: Diagnosis not present

## 2020-06-09 DIAGNOSIS — I82419 Acute embolism and thrombosis of unspecified femoral vein: Secondary | ICD-10-CM

## 2020-06-09 NOTE — Telephone Encounter (Signed)
Called to schedule leg intervention with Dr. Loreta Ave, no answer, no vm. AW

## 2020-06-09 NOTE — Progress Notes (Signed)
Right neck incision continues to ooze blood, Dr Loreta Ave on call notified with new orders.

## 2020-06-09 NOTE — Discharge Planning (Signed)
Patient discharged home in stable condition. Verbalizes understanding of all discharge instructions, including home medications and follow up appointments. 

## 2020-06-09 NOTE — Discharge Summary (Signed)
Patient ID: Brian Benton MRN: [id number] DOB/AGE: December 26, 1955 64 y.o.  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 Earleen Newport and Dr. Serafina Royals. Procedure occurred without major complications and patient was transferred to floor for overnight observation.  Overnight, patient had oozing from right IJ puncture site. Manual pressure along with new pressure dressing was applied with hemostasis of right IJ.  This AM, patient awake and alert sitting in bed. Wife at bedside. Complains of mild back pain, as expected following long procedure yesterday. Right IJ and left GSV puncture sites stable. Plan to discharge home today and follow-up for staged revascularization of left femoral vein in 06/2020 (IR schedulers to call patient to set up this procedure).   Consults: None  Significant Diagnostic Studies: MR Lumbar Spine W Wo Contrast  Result Date: 05/15/2020 CLINICAL DATA:  Metastatic bladder cell carcinoma. Evaluate response to chemo and radiation. EXAM: MRI LUMBAR SPINE WITHOUT AND WITH CONTRAST TECHNIQUE: Multiplanar and multiecho pulse sequences of the lumbar spine were obtained without and with intravenous contrast. CONTRAST:  83m GADAVIST GADOBUTROL 1 MMOL/ML  IV SOLN COMPARISON:  Lumbar MRI 02/20/2020 FINDINGS: Segmentation:  Normal Alignment:  Mild retrolisthesis L2-3 and L3-4 Vertebrae: Progressive tumor invasion of the left sacrum. Mild involvement of the lateral L5 vertebral body and left L5 transverse process appears unchanged. No new bony involvement. No fracture. Conus medullaris and cauda equina: Conus extends to the L2 level. Conus and cauda equina appear normal. Paraspinal and other soft tissues: Left iliac lymph node mass appears smaller. There is more cystic change and decrease in size. This encases the left sciatic nerve. Adjacent bony invasion of the left sacral ala has progressed. Involvement of the left iliacus muscle unchanged. Tumor in the left psoas muscle improved in the interval. Disc levels: L1-2: Negative L2-3: Mild retrolisthesis. Mild disc and facet degeneration. Mild subarticular stenosis bilaterally L3-4: Mild disc and mild facet degeneration. Mild subarticular stenosis bilaterally L4-5: Disc degeneration with disc bulging. Shallow left foraminal disc protrusion with annular fissure unchanged from the prior study bilateral facet degeneration and mild subarticular stenosis bilaterally L5-S1: Negative IMPRESSION: Metastatic lymph node mass in the left iliac lymph nodes has decreased in size. Decreased involvement of the left psoas muscle. No change in involvement of the left iliacus muscle Bony involvement involving the left sacrum has progressed. No change in minor involvement of the left lateral L5 vertebral body and transverse process. No new metastatic deposits. Electronically Signed   By: CFranchot GalloM.D.   On: 05/15/2020 14:01   IR Veno/Ext/Uni Left  Result Date: 06/08/2020 CLINICAL DATA:  64year old male with 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 He  presents today for attempted left iliocaval recanalization. EXAM: 1. Ultrasound-guided venous access of the  right internal jugular vein. 2. Inferior vena cavogram. 3. Placement of inferior vena cava filter. 4. Ultrasound-guided venous access of the left greater saphenous vein. 5. Catheter and wire recanalization of left iliofemoral venous stents. 6. Balloon angioplasty of thrombosed left iliofemoral venous stents. 7. Intravascular ultrasound. 8. Left iliac vein stent relining. 9. Balloon angioplasty of inferior vena cava. 10. Completion venography. ANESTHESIA/SEDATION: The procedure was performed under general anesthesia. CONTRAST:  21mL OMNIPAQUE IOHEXOL 300 MG/ML SOLN, 52mL OMNIPAQUE IOHEXOL 300 MG/ML SOLN, 51mL VISIPAQUE IODIXANOL 320 MG/ML IV SOLN, 50mL VISIPAQUE IODIXANOL 320 MG/ML IV SOLN FLUOROSCOPY TIME:  Forty-seven minutes, 836 mGy COMPLICATIONS: None. PROCEDURE: The procedure was performed in concert with Dr. Corrie Mckusick. The procedure, risks, benefits, and alternatives were explained to the patient. Questions regarding the procedure were encouraged and answered. The patient understands and consents to the procedure. The patient was placed supine on the procedure table. The right IJ, bilateral groins, and left ankle were prepped and draped in standard fashion. A sterile gown and sterile gloves were used for the procedure. Under direct ultrasound guidance, a 21 gauge needle was advanced into the right internal jugular vein with ultrasound image documentation performed. After securing access with a micropuncture dilator, a guidewire was advanced into the inferior vena cava. A deployment sheath was advanced over the guidewire. This was utilized to perform IVC venography. The deployment sheath was further positioned in an appropriate location for filter deployment. A Denali IVC filter was then advanced in the sheath. This was then fully deployed in the infrarenal IVC. Final filter position was confirmed with  a fluoroscopic spot image. Contrast injection was also performed through the sheath under fluoroscopy to confirm patency of the IVC at the level of the filter. An exchange length stiff glidewire was then inserted through the IVC filter delivery sheath and into the right common femoral vein. The sheath was exchanged for a 12 [place], 80 cm Ansel sheath. The sheath tip was advanced over the wire to just inferior to the level of the filter struts. Under fluoroscopic guidance, the Glidewire and an angled tip 100 cm 5 French catheter were directed into the central aspect of the indwelling left iliac stents. Catheter wire recanalization was performed without difficulty. Intraluminal position and avoidance of transgression of stent interstices was confirmed with multiple fluoroscopic obliquities. The wire was directed into the central aspect of the greater saphenous vein. Hand injection venogram was performed. Venogram demonstrated multiple collateral veins in the left groin including ileal lumbar, profundal, and pelvic collaterals. No superficial femoral vein was identified. The indwelling stents were occluded. Given inability to cannulate the femoral vein from the IJ approach, antegrade access was obtained via the left greater saphenous vein. Under direct ultrasound visualization with permanent documentation, a 21 gauge micropuncture needle was used to access the central aspect of the greater saphenous vein. Micropuncture sheath was inserted through which a Glidewire was placed and directed into the central greater saphenous vein. An 8 [place] by 25 cm vascular sheath was then placed over the Glidewire which in combination with an angled tip catheter, antegrade recanalization of the occluded stents was successful. Over the Glidewire, intravascular ultrasound was performed to evaluate the indwelling stented portion of the left iliac veins. IVUS demonstrated echogenic occlusive thrombus throughout the entirety of the stents.  The central aspect of the stent terminated in the central common iliac vein, just peripheral to the confluence. Next, from the IJ approach, balloon angioplasty was performed of the peripheral aspect of the indwelling stents  within 8 mm x 40 mm mustang balloon at multiple stations. Next, an exchange length Glidewire was inserted through the antegrade sheath into the retrograde IJ sheath and through and through access was obtained. The left GSV sheath was removed and upsized for a 45 cm 12 French sheath. A Glidewire was then inserted through the antegrade GSV sheath directly into the IJ sheath to establish through and through vascular access. Over the through and through wire balloon angioplasty was performed centrally with a 20 mm x 40 mm atlas balloon in multiple stations. Repeat venogram from the groin access demonstrated improved patency of the indwelling stents with persistent irregular luminal filling defects and sluggish flow. From the IJ access, a angled tip 5 French catheter was positioned in the right common iliac vein. Simultaneous venogram from the bilateral common iliac veins was performed. This venogram demonstrated patent right iliac system, residual chronic thrombus in the un stented portion of the left common iliac vein stent, and well position temporary IVC filter in unchanged position without evidence of thrombus. Next, a 18 mm by 60 mm wall stent was deployed under fluoroscopic guidance in the common iliac vein. Next, an 18 mm by 90 mm Wallstent was deployed under fluoroscopic guidance from the common iliac vein to the common femoral vein. Balloon molding was then performed with a 20 mm by 40 mm atlas balloon centrally followed by a 12 mm x 40 mm mustang balloon peripherally. Venogram demonstrated significantly improved patency inflow with good wall apposition of the indwelling stents. There was complete absence of the previously visualized preferential collateral outflow veins. There is some  sluggish flow in the dependent portion of the external iliac vein, likely due to limited inflow from the occlusive sheath in the greater saphenous vein. Additional attempt at retrograde catheter in wire recanalization of the superficial femoral vein was attempted without success. The left greater saphenous vein sheath was then removed and manual compression was applied until hemostasis was achieved. From the IJ approach, and IVC filter retrieval kit was inserted through the indwelling 12 French sheath. The filter apex was snared without complication and over sheath. The IVC filter was removed intact. Repeat inferior vena cavagram was performed which demonstrated widely patent inferior vena cava without evidence of thrombus or wall injury. The right internal jugular vein sheath was then removed and manual compression was applied until hemostasis was achieved. The patient was extubated and transferred to the postanesthesia care unit in stable condition. IMPRESSION: 1. Complete occlusion of the indwelling left iliofemoral stents. Wall adherent thrombus extends into the peripheral inferior vena cava. 2. Successful placement and retrieval of temporary inferior vena cava filter for intra procedural prophylaxis. 3. Successful catheter and wire recanalization of the occluded indwelling left iliofemoral stents followed by multi station balloon angioplasty and stent renal lining with central extension to cover the remaining uncovered portion of the left common iliac vein with an 18 mm x 60 mm Wallstent followed by a 18 mm x 90 mm Wallstent to extend coverage to the level of the common femoral vein. 4. Unsuccessful recanalization of the left femoral vein. PLAN: Short-term follow-up with repeat staged recanalization of the left lower extremity venous inflow. Continue therapeutic Lovenox and compression stockings until next procedure. Ruthann Cancer, MD [facility] Radiology  Electronically Signed   By: Ruthann Cancer MD   On: 06/08/2020 16:48   IR IVC FILTER PLMT / S&I Burke Keels GUID/MOD SED  Result Date: 06/08/2020 CLINICAL DATA:  64 year old male with  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 He presents today for attempted left iliocaval recanalization. EXAM: 1. Ultrasound-guided venous access of the right internal jugular vein. 2. Inferior vena cavogram. 3. Placement of inferior vena cava filter. 4. Ultrasound-guided venous access of the left greater saphenous vein. 5. Catheter and wire recanalization of left iliofemoral venous stents. 6. Balloon angioplasty of thrombosed left iliofemoral venous stents. 7. Intravascular ultrasound. 8. Left iliac vein stent relining. 9. Balloon angioplasty of inferior vena cava. 10. Completion venography. ANESTHESIA/SEDATION: The procedure was performed under general anesthesia. CONTRAST:  66mL OMNIPAQUE IOHEXOL 300 MG/ML SOLN, 22mL OMNIPAQUE IOHEXOL 300 MG/ML SOLN, 33mL VISIPAQUE IODIXANOL 320 MG/ML IV SOLN, 59mL VISIPAQUE IODIXANOL 320 MG/ML IV SOLN FLUOROSCOPY TIME:  Forty-seven minutes, 161 mGy COMPLICATIONS: None. PROCEDURE: The procedure was performed in concert with Dr. Corrie Mckusick. The procedure, risks, benefits, and alternatives were explained to the patient. Questions regarding the procedure were encouraged and answered. The patient understands and consents to the procedure. The patient was placed supine on the procedure table. The right IJ, bilateral groins, and left ankle were prepped and draped in standard fashion. A sterile gown and sterile gloves were used for the procedure. Under direct ultrasound guidance, a 21 gauge needle was advanced into the right internal jugular vein with ultrasound image documentation performed. After securing access with a  micropuncture dilator, a guidewire was advanced into the inferior vena cava. A deployment sheath was advanced over the guidewire. This was utilized to perform IVC venography. The deployment sheath was further positioned in an appropriate location for filter deployment. A Denali IVC filter was then advanced in the sheath. This was then fully deployed in the infrarenal IVC. Final filter position was confirmed with a fluoroscopic spot image. Contrast injection was also performed through the sheath under fluoroscopy to confirm patency of the IVC at the level of the filter. An exchange length stiff glidewire was then inserted through the IVC filter delivery sheath and into the right common femoral vein. The sheath was exchanged for a 12 [place], 80 cm Ansel sheath. The sheath tip was advanced over the wire to just inferior to the level of the filter struts. Under fluoroscopic guidance, the Glidewire and an angled tip 100 cm 5 French catheter were directed into the central aspect of the indwelling left iliac stents. Catheter wire recanalization was performed without difficulty. Intraluminal position and avoidance of transgression of stent interstices was confirmed with multiple fluoroscopic obliquities. The wire was directed into the central aspect of the greater saphenous vein. Hand injection venogram was performed. Venogram demonstrated multiple collateral veins in the left groin including ileal lumbar, profundal, and pelvic collaterals. No superficial femoral vein was identified. The indwelling stents were occluded. Given inability to cannulate the femoral vein from the IJ approach, antegrade access was obtained via the left greater saphenous vein. Under direct ultrasound visualization with permanent documentation, a 21 gauge micropuncture needle was used to access the central aspect of the greater saphenous vein. Micropuncture sheath was inserted through which a Glidewire was placed and directed into the central  greater saphenous vein. An 8 [place] by 25 cm vascular sheath was then placed over the Glidewire which in combination with an angled tip catheter, antegrade recanalization of the occluded stents was successful. Over the Glidewire, intravascular ultrasound was performed to evaluate the indwelling stented portion of the left iliac veins. IVUS demonstrated echogenic  occlusive thrombus throughout the entirety of the stents. The central aspect of the stent terminated in the central common iliac vein, just peripheral to the confluence. Next, from the IJ approach, balloon angioplasty was performed of the peripheral aspect of the indwelling stents within 8 mm x 40 mm mustang balloon at multiple stations. Next, an exchange length Glidewire was inserted through the antegrade sheath into the retrograde IJ sheath and through and through access was obtained. The left GSV sheath was removed and upsized for a 45 cm 12 French sheath. A Glidewire was then inserted through the antegrade GSV sheath directly into the IJ sheath to establish through and through vascular access. Over the through and through wire balloon angioplasty was performed centrally with a 20 mm x 40 mm atlas balloon in multiple stations. Repeat venogram from the groin access demonstrated improved patency of the indwelling stents with persistent irregular luminal filling defects and sluggish flow. From the IJ access, a angled tip 5 French catheter was positioned in the right common iliac vein. Simultaneous venogram from the bilateral common iliac veins was performed. This venogram demonstrated patent right iliac system, residual chronic thrombus in the un stented portion of the left common iliac vein stent, and well position temporary IVC filter in unchanged position without evidence of thrombus. Next, a 18 mm by 60 mm wall stent was deployed under fluoroscopic guidance in the common iliac vein. Next, an 18 mm by 90 mm Wallstent was deployed under fluoroscopic  guidance from the common iliac vein to the common femoral vein. Balloon molding was then performed with a 20 mm by 40 mm atlas balloon centrally followed by a 12 mm x 40 mm mustang balloon peripherally. Venogram demonstrated significantly improved patency inflow with good wall apposition of the indwelling stents. There was complete absence of the previously visualized preferential collateral outflow veins. There is some sluggish flow in the dependent portion of the external iliac vein, likely due to limited inflow from the occlusive sheath in the greater saphenous vein. Additional attempt at retrograde catheter in wire recanalization of the superficial femoral vein was attempted without success. The left greater saphenous vein sheath was then removed and manual compression was applied until hemostasis was achieved. From the IJ approach, and IVC filter retrieval kit was inserted through the indwelling 12 French sheath. The filter apex was snared without complication and over sheath. The IVC filter was removed intact. Repeat inferior vena cavagram was performed which demonstrated widely patent inferior vena cava without evidence of thrombus or wall injury. The right internal jugular vein sheath was then removed and manual compression was applied until hemostasis was achieved. The patient was extubated and transferred to the postanesthesia care unit in stable condition. IMPRESSION: 1. Complete occlusion of the indwelling left iliofemoral stents. Wall adherent thrombus extends into the peripheral inferior vena cava. 2. Successful placement and retrieval of temporary inferior vena cava filter for intra procedural prophylaxis. 3. Successful catheter and wire recanalization of the occluded indwelling left iliofemoral stents followed by multi station balloon angioplasty and stent renal lining with central extension to cover the remaining uncovered portion of the left common iliac vein with an 18 mm x 60 mm Wallstent  followed by a 18 mm x 90 mm Wallstent to extend coverage to the level of the common femoral vein. 4. Unsuccessful recanalization of the left femoral vein. PLAN: Short-term follow-up with repeat staged recanalization of the left lower extremity venous inflow. Continue therapeutic Lovenox and compression stockings until next procedure. Ruthann Cancer,  MD [facility] Radiology Electronically Signed   By: Ruthann Cancer MD   On: 06/08/2020 16:48   NM PET Image Restag (PS) Skull Base To Thigh  Result Date: 06/01/2020 CLINICAL DATA:  Subsequent treatment strategy for urologic carcinoma. Assess response to treatment. EXAM: NUCLEAR MEDICINE PET SKULL BASE TO THIGH TECHNIQUE: 8.1 mCi F-18 FDG was injected intravenously. Full-ring PET imaging was performed from the skull base to thigh after the radiotracer. CT data was obtained and used for attenuation correction and anatomic localization. Fasting blood glucose: 98 mg/dl COMPARISON:  CT 05/18/2020, 03/02/2020 FINDINGS: Mediastinal blood pool activity: SUV max 2.0 Liver activity: SUV max 2.7 NECK: No hypermetabolic lymph nodes in the neck. Incidental CT findings: none CHEST: No hypermetabolic mediastinal or hilar nodes. No suspicious pulmonary nodules on the CT scan. Incidental CT findings: Paraseptal emphysema and hyperinflated lungs. ABDOMEN/PELVIS: Hypermetabolic mass in the LEFT paraspinal tissue anterior to the sacrum with SUV max equal 7.6. Ill-defined mass at this level measures 2.6 by 2.0 cm. This is site of previously large retroperitoneal mass. A second focus of activity just anterior to the larger mass and adjacent to the venous stent SUV max equal 5.0. Focal activity in the RIGHT upper quadrant associated with the colon with SUV max equal 5.9 on image 135. On the CT portion exam there is mild bowel wall thickening of the colon at this level. No abnormal activity liver. No additional adenopathy. Diffuse activity in  stomach is favored physiologic Incidental CT findings: LEFT common iliac vein stent SKELETON: Again noted erosion of the anterior margin of the LEFT sacrum adjacent to hypermetabolic mass. No additional evidence skeletal metastasis. Incidental CT findings: none IMPRESSION: Hypermetabolic tissue in the LEFT retroperitoneum adjacent to the LEFT sacrum and iliac vein stent consistent residual with residual carcinoma. Mass is reduced significantly in size from CT 02/26/2020. Focal activity in the colon at the level of the hepatic flexure with associated bowel wall thickening. This is favored unrelated to the urologic carcinoma but could represent colorectal neoplasm. Recommend endoscopy if not current for screening. Electronically Signed   By: Suzy Bouchard M.D.   On: 06/01/2020 08:21   US Venous Img Lower Unilateral Left (DVT)  Result Date: 05/12/2020 CLINICAL DATA:  64 year old male with a history of left-sided DVT, history of malignancy of the left pelvis EXAM: LEFT LOWER EXTREMITY VENOUS DOPPLER ULTRASOUND TECHNIQUE: Gray-scale sonography with graded compression, as well as color Doppler and duplex ultrasound were performed to evaluate the lower extremity deep venous systems from the level of the common femoral vein and including the common femoral, femoral, profunda femoral, popliteal and calf veins including the posterior tibial, peroneal and gastrocnemius veins when visible. The superficial great saphenous vein was also interrogated. Spectral Doppler was utilized to evaluate flow at rest and with distal augmentation maneuvers in the common femoral, femoral and popliteal veins. COMPARISON:  None. FINDINGS: Contralateral Common Femoral Vein: No evidence of thrombus. Normal compressibility, respiratory phasicity and response to augmentation. Common Femoral Vein: Stent changes of the left common femoral vein, with no significant flow and incompressible vein. Saphenofemoral Junction: Partially compressible  junction, with flow maintained in the saphenous vein. Profunda Femoral Vein: Flow maintained within the profunda, with partially compressible junction. Femoral Vein: Partially compressible femoral vein with minimal flow maintained. Popliteal Vein: Popliteal vein partially compressible with flow maintained. Calf Veins: Posterior tibial veins with flow maintained. Peroneal veins with flow maintained. Superficial Great Saphenous Vein: No evidence of thrombus. Normal compressibility and flow  on color Doppler imaging. Other Findings:  None. IMPRESSION: Sonographic survey of the left lower extremity demonstrates stent changes of the common femoral vein, with persisting DVT of the left common femoral vein stent, occlusive. Positive duplex for DVT at the SFJ, junction of the profunda vein, and through the femoral vein segment. Electronically Signed   By: Corrie Mckusick D.O.   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 He presents today for attempted left iliocaval recanalization. EXAM: 1. Ultrasound-guided venous access of the right internal jugular vein. 2. Inferior vena cavogram. 3. Placement of inferior vena cava filter. 4. Ultrasound-guided venous access of the left greater saphenous vein. 5. Catheter and wire recanalization of left iliofemoral venous stents. 6. Balloon angioplasty of thrombosed left iliofemoral venous stents. 7. Intravascular ultrasound. 8. Left iliac vein stent relining. 9. Balloon angioplasty of inferior vena cava. 10. Completion venography. ANESTHESIA/SEDATION: The procedure was performed under general anesthesia. CONTRAST:  89m OMNIPAQUE IOHEXOL 300 MG/ML SOLN, 856mOMNIPAQUE IOHEXOL 300 MG/ML SOLN, 8062mVISIPAQUE IODIXANOL 320 MG/ML IV SOLN, 25m8mSIPAQUE IODIXANOL 320 MG/ML IV SOLN FLUOROSCOPY TIME:  Forty-seven minutes, 503 [id number] COMPLICATIONS: None. PROCEDURE: The procedure was performed in concert with Dr. JaimCorrie Mckusicke procedure, risks, benefits, and alternatives were explained to the patient. Questions regarding the procedure were encouraged and answered. The patient understands and consents to the procedure. The patient was placed supine on the procedure table. The right IJ, bilateral groins, and left ankle were prepped and draped in standard fashion. A sterile gown and sterile gloves were used for the procedure. Under direct ultrasound guidance, a 21 gauge needle was advanced into the right internal jugular vein with ultrasound image documentation performed. After securing access with a micropuncture dilator, a guidewire was advanced into the inferior vena cava. A deployment sheath was advanced over the guidewire. This was utilized to perform IVC venography. The deployment sheath was further positioned in an appropriate location for filter deployment. A Denali IVC filter was then advanced in the sheath. This was then fully deployed in the infrarenal IVC. Final filter position was confirmed with a fluoroscopic spot image. Contrast injection was also performed through the sheath under fluoroscopy to confirm patency of the IVC at the level of the filter. An exchange length stiff glidewire was then inserted through the IVC filter delivery sheath and into the right common femoral vein. The sheath was exchanged for a 12 [place] cm Ansel sheath. The sheath tip was advanced over the wire to just inferior to the level of the filter struts. Under fluoroscopic guidance, the Glidewire and an angled tip 100 cm 5 French catheter were directed into the central aspect of the indwelling left iliac stents. Catheter wire recanalization was performed without difficulty. Intraluminal position and avoidance of  transgression of stent interstices was confirmed with multiple fluoroscopic obliquities. The wire was directed into the central aspect of the greater saphenous vein. Hand injection venogram was performed. Venogram demonstrated multiple collateral veins in the left groin including ileal lumbar, profundal, and pelvic collaterals. No superficial femoral vein was identified. The indwelling stents were occluded. Given inability to cannulate the femoral vein from the IJ approach, antegrade access was obtained via the left greater saphenous vein. Under direct ultrasound visualization with permanent documentation, a  21 gauge micropuncture needle was used to access the central aspect of the greater saphenous vein. Micropuncture sheath was inserted through which a Glidewire was placed and directed into the central greater saphenous vein. An 8 [place] by 25 cm vascular sheath was then placed over the Glidewire which in combination with an angled tip catheter, antegrade recanalization of the occluded stents was successful. Over the Glidewire, intravascular ultrasound was performed to evaluate the indwelling stented portion of the left iliac veins. IVUS demonstrated echogenic occlusive thrombus throughout the entirety of the stents. The central aspect of the stent terminated in the central common iliac vein, just peripheral to the confluence. Next, from the IJ approach, balloon angioplasty was performed of the peripheral aspect of the indwelling stents within 8 mm x 40 mm mustang balloon at multiple stations. Next, an exchange length Glidewire was inserted through the antegrade sheath into the retrograde IJ sheath and through and through access was obtained. The left GSV sheath was removed and upsized for a 45 cm 12 French sheath. A Glidewire was then inserted through the antegrade GSV sheath directly into the IJ sheath to establish through and through vascular access. Over the through and through wire balloon angioplasty was  performed centrally with a 20 mm x 40 mm atlas balloon in multiple stations. Repeat venogram from the groin access demonstrated improved patency of the indwelling stents with persistent irregular luminal filling defects and sluggish flow. From the IJ access, a angled tip 5 French catheter was positioned in the right common iliac vein. Simultaneous venogram from the bilateral common iliac veins was performed. This venogram demonstrated patent right iliac system, residual chronic thrombus in the un stented portion of the left common iliac vein stent, and well position temporary IVC filter in unchanged position without evidence of thrombus. Next, a 18 mm by 60 mm wall stent was deployed under fluoroscopic guidance in the common iliac vein. Next, an 18 mm by 90 mm Wallstent was deployed under fluoroscopic guidance from the common iliac vein to the common femoral vein. Balloon molding was then performed with a 20 mm by 40 mm atlas balloon centrally followed by a 12 mm x 40 mm mustang balloon peripherally. Venogram demonstrated significantly improved patency inflow with good wall apposition of the indwelling stents. There was complete absence of the previously visualized preferential collateral outflow veins. There is some sluggish flow in the dependent portion of the external iliac vein, likely due to limited inflow from the occlusive sheath in the greater saphenous vein. Additional attempt at retrograde catheter in wire recanalization of the superficial femoral vein was attempted without success. The left greater saphenous vein sheath was then removed and manual compression was applied until hemostasis was achieved. From the IJ approach, and IVC filter retrieval kit was inserted through the indwelling 12 French sheath. The filter apex was snared without complication and over sheath. The IVC filter was removed intact. Repeat inferior vena cavagram was performed which demonstrated widely patent inferior vena cava without  evidence of thrombus or wall injury. The right internal jugular vein sheath was then removed and manual compression was applied until hemostasis was achieved. The patient was extubated and transferred to the postanesthesia care unit in stable condition. IMPRESSION: 1. Complete occlusion of the indwelling left iliofemoral stents. Wall adherent thrombus extends into the peripheral inferior vena cava. 2. Successful placement and retrieval of temporary inferior vena cava filter for intra procedural prophylaxis. 3. Successful catheter and wire recanalization of the occluded indwelling left iliofemoral stents followed  by multi station balloon angioplasty and stent renal lining with central extension to cover the remaining uncovered portion of the left common iliac vein with an 18 mm x 60 mm Wallstent followed by a 18 mm x 90 mm Wallstent to extend coverage to the level of the common femoral vein. 4. Unsuccessful recanalization of the left femoral vein. PLAN: Short-term follow-up with repeat staged recanalization of the left lower extremity venous inflow. Continue therapeutic Lovenox and compression stockings until next procedure. 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 He presents today for attempted left iliocaval recanalization.  EXAM: 1. Ultrasound-guided venous access of the right internal jugular vein. 2. Inferior vena cavogram. 3. Placement of inferior vena cava filter. 4. Ultrasound-guided venous access of the left greater saphenous vein. 5. Catheter and  wire recanalization of left iliofemoral venous stents. 6. Balloon angioplasty of thrombosed left iliofemoral venous stents. 7. Intravascular ultrasound. 8. Left iliac vein stent relining. 9. Balloon angioplasty of inferior vena cava. 10. Completion venography.  ANESTHESIA/SEDATION: The procedure was performed under general anesthesia.  CONTRAST:  26m OMNIPAQUE IOHEXOL 300 MG/ML SOLN, 851mOMNIPAQUE IOHEXOL 300 MG/ML SOLN, 8045mISIPAQUE IODIXANOL 320 MG/ML IV SOLN, 75m29mSIPAQUE IODIXANOL 320 MG/ML IV SOLN  FLUOROSCOPY TIME:  Forty-seven minutes, 503 [id number]  COMPLICATIONS: None.  PROCEDURE: The procedure was performed in concert with Dr. JaimCorrie Mckusickhe procedure, risks, benefits, and alternatives were explained to the patient. Questions regarding the procedure were encouraged and answered. The patient understands and consents to the procedure.  The patient was placed supine on the procedure table. The right IJ, bilateral groins, and left ankle were prepped and draped in standard fashion. A sterile gown and sterile gloves were used for the procedure.  Under direct ultrasound guidance, a 21 gauge needle was advanced into the right internal jugular vein with ultrasound image documentation performed. After securing access with a micropuncture dilator, a guidewire was advanced into the inferior vena cava. A deployment sheath was advanced over the guidewire. This was utilized to perform IVC venography.  The deployment sheath was further positioned in an appropriate location for filter deployment. A Denali IVC filter was then advanced in the sheath. This was then fully deployed in the infrarenal IVC. Final filter position was confirmed with a fluoroscopic spot image. Contrast injection was also performed through the sheath under fluoroscopy to confirm patency of the IVC at the level of the filter.  An exchange length stiff glidewire was then inserted through the IVC filter delivery sheath and into the right  common femoral vein. The sheath was exchanged for a 12 [place] cm Ansel sheath. The sheath tip was advanced over the wire to just inferior to the level of the filter struts. Under fluoroscopic guidance, the Glidewire and an angled tip 100 cm 5 French catheter were directed into the central aspect of the indwelling left iliac stents. Catheter wire recanalization was performed without difficulty. Intraluminal position and avoidance of transgression of stent interstices was confirmed with multiple fluoroscopic obliquities. The wire was directed into the central aspect of the greater saphenous vein. Hand injection venogram was performed. Venogram demonstrated  multiple collateral veins in the left groin including ileal lumbar, profundal, and pelvic collaterals. No superficial femoral vein was identified. The indwelling stents were occluded.  Given inability to cannulate the femoral vein from the IJ approach, antegrade access was obtained via the left greater saphenous vein. Under direct ultrasound visualization with permanent documentation, a 21 gauge micropuncture needle was used to access the central aspect of the greater saphenous vein. Micropuncture sheath was inserted through which a Glidewire was placed and directed into the central greater saphenous vein. An 8 [place] by 25 cm vascular sheath was then placed over the Glidewire which in combination with an angled tip catheter, antegrade recanalization of the occluded stents was successful.  Over the Glidewire, intravascular ultrasound was performed to evaluate the indwelling stented portion of the left iliac veins. IVUS demonstrated echogenic occlusive thrombus throughout the entirety of the stents. The central aspect of the stent terminated in the central common iliac vein, just peripheral to the confluence.  Next, from the IJ approach, balloon angioplasty was performed of the peripheral aspect of the indwelling stents within 8 mm x 40 mm mustang balloon at  multiple stations. Next, an exchange length Glidewire was inserted through the antegrade sheath into the retrograde IJ sheath and through and through access was obtained. The left GSV sheath was removed and upsized for a 45 cm 12 French sheath. A Glidewire was then inserted through the antegrade GSV sheath directly into the IJ sheath to establish through and through vascular access. Over the through and through wire balloon angioplasty was performed centrally with a 20 mm x 40 mm atlas balloon in multiple stations.  Repeat venogram from the groin access demonstrated improved patency of the indwelling stents with persistent irregular luminal filling defects and sluggish flow. From the IJ access, a angled tip 5 French catheter was positioned in the right common iliac vein. Simultaneous venogram from the bilateral common iliac veins was performed. This venogram demonstrated patent right iliac system, residual chronic thrombus in the un stented portion of the left common iliac vein stent, and well position temporary IVC filter in unchanged position without evidence of thrombus.  Next, a 18 mm by 60 mm wall stent was deployed under fluoroscopic guidance in the common iliac vein. Next, an 18 mm by 90 mm Wallstent was deployed under fluoroscopic guidance from the common iliac vein to the common femoral vein. Balloon molding was then performed with a 20 mm by 40 mm atlas balloon centrally followed by a 12 mm x 40 mm mustang balloon peripherally. Venogram demonstrated significantly improved patency inflow with good wall apposition of the indwelling stents. There was complete absence of the previously visualized preferential collateral outflow veins. There is some sluggish flow in the dependent portion of the external iliac vein, likely due to limited inflow from the occlusive sheath in the greater saphenous vein.  Additional attempt at retrograde catheter in wire recanalization of the superficial femoral vein was  attempted without success. The left greater saphenous vein sheath was then removed and manual compression was applied until hemostasis was achieved.  From the IJ approach, and IVC filter retrieval kit was inserted through the indwelling 12 French sheath. The filter apex was snared without complication and over sheath. The IVC filter was removed intact. Repeat inferior vena cavagram was performed which demonstrated widely patent inferior vena cava without evidence of thrombus or wall injury. The right internal jugular vein sheath was then removed and manual compression was applied until hemostasis was achieved.  The  patient was extubated and transferred to the postanesthesia care unit in stable condition.  IMPRESSION: 1. Complete occlusion of the indwelling left iliofemoral stents. Wall adherent thrombus extends into the peripheral inferior vena cava. 2. Successful placement and retrieval of temporary inferior vena cava filter for intra procedural prophylaxis. 3. Successful catheter and wire recanalization of the occluded indwelling left iliofemoral stents followed by multi station balloon angioplasty and stent renal lining with central extension to cover the remaining uncovered portion of the left common iliac vein with an 18 mm x 60 mm Wallstent followed by a 18 mm x 90 mm Wallstent to extend coverage to the level of the common femoral vein. 4. Unsuccessful recanalization of the left femoral vein.  PLAN: Short-term follow-up with repeat staged recanalization of the left lower extremity venous inflow. Continue therapeutic Lovenox and compression stockings until next procedure.  Ruthann Cancer, MD  [facility]  [facility]   Electronically Signed   By: Ruthann Cancer MD   On: 06/08/2020 16:48    ECHOCARDIOGRAM COMPLETE  Result Date: 05/28/2020    ECHOCARDIOGRAM REPORT   Patient Name:   Brian Benton Date of Exam: 05/28/2020 Medical Rec #:  [id number]        Height:       72.0 in Accession #:    [id number]      Weight:       163.0 lb Date of Birth:  07-Jul-1955       BSA:          1.953 m Patient Age:    32 years        BP:           131/78 mmHg Patient Gender: M               HR:           65 bpm. Exam Location:  Outpatient Procedure: 2D Echo, 3D Echo, Cardiac Doppler, Color Doppler and Strain Analysis Indications:    Z51.11 Encounter for antineoplastic chemotheraphy  History:        Patient has no prior history of Echocardiogram examinations.                 Risk Factors:Hypertension, Current Smoker and Dyslipidemia.                 Cancer.  Sonographer:    Roseanna Rainbow Referring Phys: [place] Comments: Technically difficult study due to poor echo windows and suboptimal parasternal window. Global longitudinal strain was attempted. IMPRESSIONS  1. Left ventricular ejection fraction, by estimation, is 50 to 55%. The left ventricle has low normal function. The left ventricle has no regional wall motion abnormalities. Left ventricular diastolic parameters were normal.  2. Right ventricular systolic function is normal. The right ventricular size is normal.  3. The mitral valve is grossly normal. Trivial mitral valve regurgitation.  4. The aortic valve is grossly normal. Aortic valve regurgitation is not visualized. No aortic stenosis is present. FINDINGS  Left Ventricle: Left ventricular ejection fraction, by estimation, is 50 to 55%. The left ventricle has low normal function. The left ventricle has no regional wall motion abnormalities. The left ventricular internal cavity size was normal in size. There is no left ventricular hypertrophy. Left ventricular diastolic parameters were normal. Right Ventricle: The right ventricular size is normal. No increase in right ventricular wall thickness. Right ventricular systolic function is normal. Left Atrium: Left atrial size was normal in size.  Right Atrium: Right atrial size was normal in size. Pericardium:  There is no evidence of pericardial effusion. Mitral Valve: The mitral valve is grossly normal. Trivial mitral valve regurgitation. Tricuspid Valve: The tricuspid valve is grossly normal. Tricuspid valve regurgitation is trivial. Aortic Valve: The aortic valve is grossly normal. Aortic valve regurgitation is not visualized. No aortic stenosis is present. Pulmonic Valve: The pulmonic valve was grossly normal. Pulmonic valve regurgitation is not visualized. Aorta: The aortic root and ascending aorta are structurally normal, with no evidence of dilitation. IAS/Shunts: The atrial septum is grossly normal.  LEFT VENTRICLE PLAX 2D LVIDd:         4.55 cm      Diastology LVIDs:         3.20 cm      LV e' medial:    7.18 cm/s LV PW:         1.40 cm      LV E/e' medial:  7.6 LV IVS:        1.10 cm      LV e' lateral:   7.94 cm/s                             LV E/e' lateral: 6.9  LV Volumes (MOD)            2D Longitudinal Strain LV vol d, MOD A2C: 108.0 ml 2D Strain GLS Avg:     -20.7 % LV vol d, MOD A4C: 119.0 ml LV vol s, MOD A2C: 44.8 ml LV vol s, MOD A4C: 55.7 ml LV SV MOD A2C:     63.2 ml LV SV MOD A4C:     119.0 ml LV SV MOD BP:      63.0 ml RIGHT VENTRICLE             IVC RV S prime:     10.20 cm/s  IVC diam: 1.50 cm TAPSE (M-mode): 2.8 cm LEFT ATRIUM             Index       RIGHT ATRIUM           Index LA diam:        4.30 cm 2.20 cm/m  RA Area:     15.50 cm LA Vol (A2C):   60.2 ml 30.82 ml/m RA Volume:   41.10 ml  21.04 ml/m LA Vol (A4C):   34.1 ml 17.46 ml/m LA Biplane Vol: 44.2 ml 22.63 ml/m  AORTIC VALVE LVOT Vmax:   105.00 cm/s LVOT Vmean:  73.800 cm/s LVOT VTI:    0.199 m  AORTA Ao Root diam: 3.80 cm Ao Asc diam:  3.60 cm MITRAL VALVE MV Area (PHT): 2.99 cm    SHUNTS MV Decel Time: 254 msec    Systemic VTI: 0.20 m MV E velocity: 54.80 cm/s MV A velocity: 65.10 cm/s MV E/A ratio:  0.84 Mertie Moores MD Electronically signed by Mertie Moores MD Signature Date/Time: 05/28/2020/10:25:30 AM    Final    IR  INTRAVASCULAR ULTRASOUND NON CORONARY  Result Date: 06/09/2020 Narrative & Impression CLINICAL DATA:  64 year old male with 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 He presents today for attempted left iliocaval recanalization.  EXAM: 1. Ultrasound-guided venous access of the right internal jugular vein. 2. Inferior vena cavogram. 3. Placement of inferior vena cava  filter. 4. Ultrasound-guided venous access of the left greater saphenous vein. 5. Catheter and wire recanalization of left iliofemoral venous stents. 6. Balloon angioplasty of thrombosed left iliofemoral venous stents. 7. Intravascular ultrasound. 8. Left iliac vein stent relining. 9. Balloon angioplasty of inferior vena cava. 10. Completion venography.  ANESTHESIA/SEDATION: The procedure was performed under general anesthesia.  CONTRAST:  59m OMNIPAQUE IOHEXOL 300 MG/ML SOLN, 826mOMNIPAQUE IOHEXOL 300 MG/ML SOLN, 8023mISIPAQUE IODIXANOL 320 MG/ML IV SOLN, 32m60mSIPAQUE IODIXANOL 320 MG/ML IV SOLN  FLUOROSCOPY TIME:  Forty-seven minutes, 503 102  COMPLICATIONS: None.  PROCEDURE: The procedure was performed in concert with Dr. JaimCorrie Mckusickhe procedure, risks, benefits, and alternatives were explained to the patient. Questions regarding the procedure were encouraged and answered. The patient understands and consents to the procedure.  The patient was placed supine on the procedure table. The right IJ, bilateral groins, and left ankle were prepped and draped in standard fashion. A sterile gown and sterile gloves were used for the procedure.  Under direct ultrasound guidance, a 21 gauge needle was advanced into the right internal jugular vein with ultrasound image documentation performed. After securing access with a micropuncture dilator, a guidewire was advanced  into the inferior vena cava. A deployment sheath was advanced over the guidewire. This was utilized to perform IVC venography.  The deployment sheath was further positioned in an appropriate location for filter deployment. A Denali IVC filter was then advanced in the sheath. This was then fully deployed in the infrarenal IVC. Final filter position was confirmed with a fluoroscopic spot image. Contrast injection was also performed through the sheath under fluoroscopy to confirm patency of the IVC at the level of the filter.  An exchange length stiff glidewire was then inserted through the IVC filter delivery sheath and into the right common femoral vein. The sheath was exchanged for a 12 [place] cm Ansel sheath. The sheath tip was advanced over the wire to just inferior to the level of the filter struts. Under fluoroscopic guidance, the Glidewire and an angled tip 100 cm 5 French catheter were directed into the central aspect of the indwelling left iliac stents. Catheter wire recanalization was performed without difficulty. Intraluminal position and avoidance of transgression of stent interstices was confirmed with multiple fluoroscopic obliquities. The wire was directed into the central aspect of the greater saphenous vein. Hand injection venogram was performed. Venogram demonstrated multiple collateral veins in the left groin including ileal lumbar, profundal, and pelvic collaterals. No superficial femoral vein was identified. The indwelling stents were occluded.  Given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

## 2020-06-10 ENCOUNTER — Other Ambulatory Visit: Payer: 59

## 2020-06-10 ENCOUNTER — Ambulatory Visit: Payer: 59

## 2020-06-10 ENCOUNTER — Other Ambulatory Visit: Payer: Self-pay | Admitting: Hematology & Oncology

## 2020-06-10 ENCOUNTER — Other Ambulatory Visit: Payer: Self-pay | Admitting: *Deleted

## 2020-06-10 ENCOUNTER — Encounter: Payer: Self-pay | Admitting: *Deleted

## 2020-06-10 ENCOUNTER — Ambulatory Visit: Payer: 59 | Admitting: Hematology & Oncology

## 2020-06-10 DIAGNOSIS — C679 Malignant neoplasm of bladder, unspecified: Secondary | ICD-10-CM

## 2020-06-10 MED ORDER — ONDANSETRON 8 MG PO TBDP: 8.0000 mg | ORAL_TABLET | Freq: Three times a day (TID) | ORAL | 0 refills | Status: DC | PRN

## 2020-06-10 MED FILL — ENOXAPARIN SODIUM 80 MG/0.8: 80 | 12 days supply | Qty: 19 | Fill #0

## 2020-06-10 MED FILL — LORazepam 0.5 MG TABS: 0.5 | 23 days supply | Qty: 90 | Fill #1

## 2020-06-10 MED FILL — ONDANSETRON ODT 8 MG TABLET: 8 | 10 days supply | Qty: 30 | Fill #0

## 2020-06-10 NOTE — Progress Notes (Signed)
Patient requires additional procedures for his occluded stent which are to be scheduled on 06/17/2020. She was originally scheduled for day 2 of chemo that day. He'd like to know if his chemo can be shifted for that week.   Spoke to Dr Myna Hidalgo and he would like the chemo to be rescheduled for the following week. (week of 06/22/20). Patient notified and message sent to scheduling.   Oncology Nurse Navigator Documentation  Oncology Nurse Navigator Flowsheets 06/10/2020  Abnormal Finding Date -  Confirmed Diagnosis Date -  Diagnosis Status -  Planned Course of Treatment -  Phase of Treatment -  Chemotherapy Pending- Reason: -  Chemo/Radiation Concurrent Actual Start Date: -  Navigator Follow Up Date: 06/24/2020  Navigator Follow Up Reason: Follow-up Appointment;Chemotherapy  Navigator Location [facility]  Referral Date to RadOnc/MedOnc -  Navigator Encounter Type Telephone;Appt/Treatment Plan Review  Telephone Incoming Call;Appt Confirmation/Clarification  Treatment Initiated Date -  Patient Visit Type MedOnc  Treatment Phase Active Tx  Barriers/Navigation Needs Coordination of Care;Education  Education -  Interventions Coordination of Care;Psycho-Social Support  Acuity Level 2-Minimal Needs (1-2 Barriers Identified)  Referrals -  Coordination of Care Appts  Education Method -  Support Groups/Services Friends and Family  Time Spent with Patient 30

## 2020-06-16 ENCOUNTER — Inpatient Hospital Stay: Payer: 59 | Admitting: Hematology & Oncology

## 2020-06-16 ENCOUNTER — Other Ambulatory Visit: Payer: Self-pay | Admitting: Student

## 2020-06-16 ENCOUNTER — Ambulatory Visit: Payer: 59 | Admitting: Hematology & Oncology

## 2020-06-16 ENCOUNTER — Inpatient Hospital Stay: Payer: 59

## 2020-06-16 ENCOUNTER — Other Ambulatory Visit: Payer: Self-pay | Admitting: Hematology & Oncology

## 2020-06-16 MED FILL — oxyCODONE HCL 10 MG TABS: 10 | 15 days supply | Qty: 90 | Fill #0

## 2020-06-16 NOTE — H&P (Signed)
Chief Complaint: Patient was seen in consultation today for left lower extremity DVT management  Referring Physician(s): Corrie Mckusick  Supervising Physician: Corrie Mckusick  Patient Status: [facility] - Out-pt  History of Present Illness: Brian Benton is a 65 y.o. 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 He has had a prior thrombectomy with iliac stent placement.  Brian Benton is familiar to IR from a left hemi-pelvic mass biopsy 03/03/20 and port-a-catheter placement 06/01/20. He met with Dr. Earleen Newport to discuss possible DVT treatment options on 04/27/20.   A duplex of the left leg was performed on 05/12/20 confirming a mix of sub-acute and chronic DVT changes of the iliac and femoral system. A CTA of the abdomen/pelvis was done on 05/18/20 which demonstrated left iliac vein/stent occlusion, non-occlusive IVC thrombus and improving appearance of the left pelvic mass/malignancy.    The patient presented to IR 06/08/20 for percutaneous transluminal angioplasty and stenting of the occluded left iliac stent system. A temporary IVC filter was placed for the procedure and retrieved afterwards. The procedure was done under general anesthesia and he was admitted for overnight observation.   Brian Benton returns today for the second stage of his treatment which will target the left femoral vein. This procedure will be done with moderate sedation and he will be discharged home after a sufficient recovery period. He states he is very pleased with the outcome of the 06/08/20 procedure in that he has no pain, swelling or mobility limitations in his legs. He is walking daily, at least a mile or more.    Past Medical History:  Diagnosis Date  . Cancer ([place])    bladder  . History of bladder cancer    s/p  TURBT 07-25-2014  . Hyperlipidemia   . Mass of left hand   . 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 BLADDER SURGERY     [place]  . COLONOSCOPY    . CYSTECTOMY    . CYSTOSCOPY WITH BIOPSY N/A 07/25/2014   Procedure: CYSTOSCOPY WITH BLADDER BIOPSY AND FULGERATION;  Surgeon: [name], MD;  Location: [facility];  Service: Urology;  Laterality: N/A;  . CYSTOSCOPY WITH BIOPSY N/A 11/28/2014   Procedure: CYSTOSCOPY WITH  BLADDER BIOPSY;  Surgeon: [name], MD;  Location: [facility];  Service: Urology;  Laterality: N/A;  . HEMORRHOID SURGERY  03/15/2012   Procedure: HEMORRHOIDECTOMY;  Surgeon: [name], MD;  Location: [facility];  Service: General;  Laterality: N/A;  . HERNIA REPAIR    . INGUINAL HERNIA REPAIR Bilateral 2006  . INTRAVASCULAR ULTRASOUND/IVUS N/A 03/13/2020   Procedure: Intravascular Ultrasound/IVUS;  Surgeon: Elam Dutch, MD;  Location: [place] [facility];  Service: Cardiovascular;  Laterality: N/A;  . IR IMAGING GUIDED PORT INSERTION  06/01/2020  . IR INTRAVASCULAR ULTRASOUND NON CORONARY  06/08/2020  . IR IVC FILTER PLMT / S&I /IMG GUID/MOD SED  06/08/2020  . IR PTA VENOUS EXCEPT DIALYSIS CIRCUIT  06/08/2020  . IR RADIOLOGIST EVAL & MGMT  04/30/2020  . IR RADIOLOGIST EVAL & MGMT  05/27/2020  . IR TRANSCATH PLC STENT 1ST ART NOT LE CV CAR VERT CAR  06/08/2020  . IR US GUIDE VASC ACCESS LEFT  06/08/2020  . IR VENO/EXT/UNI LEFT  06/08/2020  . IVC VENOGRAPHY N/A 03/13/2020   Procedure: IVC Venography;  Surgeon: Elam Dutch, MD;  Location: [place] [facility];  Service: Cardiovascular;  Laterality: N/A;  . LOWER EXTREMITY VENOGRAPHY Left 03/13/2020   Procedure: LOWER EXTREMITY VENOGRAPHY;  Surgeon: Elam Dutch, MD;  Location: [place] [facility];  Service: Cardiovascular;  Laterality: Left;  Marland Kitchen MASS EXCISION Left 11/12/2018   Procedure: EXCISION MASS LEFT HAND;  Surgeon: Daryll Brod, MD;  Location: [place];  Service: Orthopedics;   Laterality: Left;  FAB  . PERIPHERAL VASCULAR INTERVENTION  03/13/2020   Procedure: PERIPHERAL VASCULAR INTERVENTION;  Surgeon: Elam Dutch, MD;  Location: [place] [facility];  Service: Cardiovascular;;  . PERIPHERAL VASCULAR THROMBECTOMY N/A 03/13/2020   Procedure: PERIPHERAL VASCULAR THROMBECTOMY;  Surgeon: Elam Dutch, MD;  Location: [place] [facility];  Service: Cardiovascular;  Laterality: N/A;  . RADIOLOGY WITH ANESTHESIA N/A 02/20/2020   Procedure: MRI LUMBAR W/O CONTRAST  WITH ANESTHESIA;  Surgeon: Radiologist, Medication, MD;  Location: [place];  Service: Radiology;  Laterality: N/A;  . RADIOLOGY WITH ANESTHESIA N/A 06/08/2020   Procedure: IR WITH ANESTHESIA VENOGRAM;  Surgeon: Radiologist, Medication, MD;  Location: [place];  Service: Radiology;  Laterality: N/A;  . TESTICLE SURGERY  2004   Ruptured Undescended Right testicle   . TRANSURETHRAL RESECTION OF BLADDER TUMOR WITH GYRUS (TURBT-GYRUS) N/A 05/23/2014   Procedure: TRANSURETHRAL RESECTION OF BLADDER TUMOR WITH GYRUS (TURBT-GYRUS);  Surgeon: [name], MD;  Location: [facility];  Service: Urology;  Laterality: N/A;  . WISDOM TOOTH EXTRACTION      Allergies: Patient has no known allergies.  Medications: Prior to Admission medications   Medication Sig Start Date End Date Taking? Authorizing Provider  ASPIRIN ADULT LOW STRENGTH 81 MG EC tablet TAKE 1 TABLET BY MOUTH EVERY DAY Patient taking differently: Take 81 mg by mouth daily. 10/03/19   Patwardhan, Reynold Bowen, MD  Calcium Carbonate-Vit D-Min (CALTRATE PLUS PO) Take 1 tablet by mouth daily.    [provider]  enoxaparin (LOVENOX) 80 MG/0.8ML injection Inject 0.8 mLs (80 mg total) into the skin every 12 (twelve) hours. 05/20/20   Volanda Napoleon, MD  fentaNYL (DURAGESIC) 50 MCG/HR PLACE 1 PATCH ONTO THE SKIN EVERY 3 (THREE) DAYS. 06/01/20   Volanda Napoleon, MD  gabapentin (NEURONTIN) 600 MG tablet Take 1 tablet (600 mg total) by mouth in the  morning, at noon, in the evening, and at bedtime. 04/13/20   Volanda Napoleon, MD  lidocaine-prilocaine (EMLA) cream Apply to affected area once Patient taking differently: Apply 1 application topically once. Apply to affected area once 06/02/20   Volanda Napoleon, MD  LORazepam (ATIVAN) 0.5 MG tablet Take 1 tablet (0.5 mg total) by mouth every 6 (six) hours as needed (Nausea or vomiting). 06/02/20   Volanda Napoleon, MD  meloxicam (MOBIC) 15 MG tablet Take 1 tablet (15 mg total) by mouth daily. 03/20/20   Volanda Napoleon, MD  Multiple Vitamins-Minerals (MULTIVITAMIN WITH MINERALS) tablet Take 1 tablet by mouth daily.    [provider]  ondansetron (ZOFRAN ODT) 8 MG disintegrating tablet Take 1 tablet (8 mg total) by mouth every 8 (eight) hours as needed for nausea or vomiting. 06/10/20   Volanda Napoleon, MD  Oxycodone HCl 10 MG TABS TAKE 1 TABLET (10 MG TOTAL) BY MOUTH EVERY 4 (FOUR) HOURS AS NEEDED. Patient taking differently: Take 10 mg by mouth every 6 (six) hours as needed (pain). 05/01/20   Volanda Napoleon, MD  sodium chloride (OCEAN) 0.65 %  SOLN nasal spray Place 1 spray into both nostrils daily as needed for congestion (Allergies).    [provider]  zolpidem (AMBIEN) 10 MG tablet TAKE 1 TABLET (10 MG TOTAL) BY MOUTH AT BEDTIME AS NEEDED FOR SLEEP. 06/01/20 07/01/20  Volanda Napoleon, MD     Family History  Problem Relation Age of Onset  . Cancer Mother        leukemia  . Diabetes Brother     Social History   Socioeconomic History  . Marital status: Married    Spouse name: Not on file  . Number of children: 2  . Years of education: Not on file  . Highest education level: Not on file  Occupational History  . Not on file  Tobacco Use  . Smoking status: Light Tobacco Smoker    Packs/day: 0.25    Years: 30.00    Pack years: 7.50    Types: Cigarettes  . Smokeless tobacco: Never Used  Vaping Use  . Vaping Use: Never used  Substance and Sexual Activity  .  Alcohol use: Not Currently    Comment: many 4 times a years a social drink  . Drug use: No  . Sexual activity: Not on file  Other Topics Concern  . Not on file  Social History Narrative  . Not on file   Social Determinants of Health   Financial Resource Strain: Not on file  Food Insecurity: Not on file  Transportation Needs: Not on file  Physical Activity: Not on file  Stress: Not on file  Social Connections: Not on file    Review of Systems: A 12 point ROS discussed and pertinent positives are indicated in the HPI above.  All other systems are negative.  Review of Systems  Constitutional: Negative for appetite change and fatigue.  Respiratory: Negative for cough and shortness of breath.   Cardiovascular: Negative for chest pain and leg swelling.  Gastrointestinal: Negative for abdominal pain, diarrhea, nausea and vomiting.  Musculoskeletal: Positive for back pain.       Lower back/buttock pain; chronic  Neurological: Negative for dizziness and headaches.  Hematological: Does not bruise/bleed easily.    Vital Signs: BP 113/65   Pulse 77   Temp 98.5 F (36.9 C) (Oral)   Resp 20   Ht 6' (1.829 m)   Wt 165 lb (74.8 kg)   SpO2 99%   BMI 22.38 kg/m   Physical Exam Constitutional:      General: He is not in acute distress. HENT:     Mouth/Throat:     Mouth: Mucous membranes are moist.     Pharynx: Oropharynx is clear.  Cardiovascular:     Rate and Rhythm: Normal rate and regular rhythm.     Pulses: Normal pulses.     Heart sounds: Normal heart sounds.     Comments: +2 pedal pulses bilaterally. Lower extremities are warm with good color and capillary refill.  Pulmonary:     Effort: Pulmonary effort is normal.     Breath sounds: Normal breath sounds.  Abdominal:     General: Bowel sounds are normal.     Palpations: Abdomen is soft.  Musculoskeletal:     Right lower leg: No edema.     Left lower leg: No edema.     Comments: Patient states he is walking one mile  daily without any difficulty  Skin:    General: Skin is warm and dry.  Neurological:     Mental Status: He is alert  and oriented to person, place, and time.  Psychiatric:        Mood and Affect: Mood normal.        Behavior: Behavior normal.        Thought Content: Thought content normal.        Judgment: Judgment normal.     Imaging: IR Veno/Ext/Uni Left  Result Date: 06/08/2020 CLINICAL DATA:  65 year old male with 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 He presents today for attempted left iliocaval recanalization. EXAM: 1. Ultrasound-guided venous access of the right internal jugular vein. 2. Inferior vena cavogram. 3. Placement of inferior vena cava filter. 4. Ultrasound-guided venous access of the left greater saphenous vein. 5. Catheter and wire recanalization of left iliofemoral venous stents. 6. Balloon angioplasty of thrombosed left iliofemoral venous stents. 7. Intravascular ultrasound. 8. Left iliac vein stent relining. 9. Balloon angioplasty of inferior vena cava. 10. Completion venography. ANESTHESIA/SEDATION: The procedure was performed under general anesthesia. CONTRAST:  32m OMNIPAQUE IOHEXOL 300 MG/ML SOLN, 811mOMNIPAQUE IOHEXOL 300 MG/ML SOLN, 8078mISIPAQUE IODIXANOL 320 MG/ML IV SOLN, 31m49mSIPAQUE IODIXANOL 320 MG/ML IV SOLN FLUOROSCOPY TIME:  Forty-seven minutes, 503 [id number] COMPLICATIONS: None. PROCEDURE: The procedure was performed in concert with Dr. JaimCorrie Mckusicke procedure, risks, benefits, and alternatives were explained to the patient. Questions regarding the procedure were encouraged and answered. The patient understands and consents to the procedure. The patient was placed supine on the procedure table. The right IJ, bilateral groins, and left ankle were prepped and draped in standard  fashion. A sterile gown and sterile gloves were used for the procedure. Under direct ultrasound guidance, a 21 gauge needle was advanced into the right internal jugular vein with ultrasound image documentation performed. After securing access with a micropuncture dilator, a guidewire was advanced into the inferior vena cava. A deployment sheath was advanced over the guidewire. This was utilized to perform IVC venography. The deployment sheath was further positioned in an appropriate location for filter deployment. A Denali IVC filter was then advanced in the sheath. This was then fully deployed in the infrarenal IVC. Final filter position was confirmed with a fluoroscopic spot image. Contrast injection was also performed through the sheath under fluoroscopy to confirm patency of the IVC at the level of the filter. An exchange length stiff glidewire was then inserted through the IVC filter delivery sheath and into the right common femoral vein. The sheath was exchanged for a 12 [place] cm Ansel sheath. The sheath tip was advanced over the wire to just inferior to the level of the filter struts. Under fluoroscopic guidance, the Glidewire and an angled tip 100 cm 5 French catheter were directed into the central aspect of the indwelling left iliac stents. Catheter wire recanalization was performed without difficulty. Intraluminal position and avoidance of transgression of stent interstices was confirmed with multiple fluoroscopic obliquities. The wire was directed into the central aspect of the greater saphenous vein. Hand injection venogram was performed. Venogram demonstrated multiple collateral veins in the left groin including ileal lumbar, profundal, and pelvic collaterals. No superficial femoral vein was identified. The indwelling stents were occluded. Given inability to cannulate the femoral vein from the IJ approach, antegrade access was obtained via the left greater saphenous vein. Under direct ultrasound  visualization with permanent documentation, a 21 gauge micropuncture needle was used to access the central aspect of the greater  saphenous vein. Micropuncture sheath was inserted through which a Glidewire was placed and directed into the central greater saphenous vein. An 8 [place] by 25 cm vascular sheath was then placed over the Glidewire which in combination with an angled tip catheter, antegrade recanalization of the occluded stents was successful. Over the Glidewire, intravascular ultrasound was performed to evaluate the indwelling stented portion of the left iliac veins. IVUS demonstrated echogenic occlusive thrombus throughout the entirety of the stents. The central aspect of the stent terminated in the central common iliac vein, just peripheral to the confluence. Next, from the IJ approach, balloon angioplasty was performed of the peripheral aspect of the indwelling stents within 8 mm x 40 mm mustang balloon at multiple stations. Next, an exchange length Glidewire was inserted through the antegrade sheath into the retrograde IJ sheath and through and through access was obtained. The left GSV sheath was removed and upsized for a 45 cm 12 French sheath. A Glidewire was then inserted through the antegrade GSV sheath directly into the IJ sheath to establish through and through vascular access. Over the through and through wire balloon angioplasty was performed centrally with a 20 mm x 40 mm atlas balloon in multiple stations. Repeat venogram from the groin access demonstrated improved patency of the indwelling stents with persistent irregular luminal filling defects and sluggish flow. From the IJ access, a angled tip 5 French catheter was positioned in the right common iliac vein. Simultaneous venogram from the bilateral common iliac veins was performed. This venogram demonstrated patent right iliac system, residual chronic thrombus in the un stented portion of the left common iliac vein stent, and well position  temporary IVC filter in unchanged position without evidence of thrombus. Next, a 18 mm by 60 mm wall stent was deployed under fluoroscopic guidance in the common iliac vein. Next, an 18 mm by 90 mm Wallstent was deployed under fluoroscopic guidance from the common iliac vein to the common femoral vein. Balloon molding was then performed with a 20 mm by 40 mm atlas balloon centrally followed by a 12 mm x 40 mm mustang balloon peripherally. Venogram demonstrated significantly improved patency inflow with good wall apposition of the indwelling stents. There was complete absence of the previously visualized preferential collateral outflow veins. There is some sluggish flow in the dependent portion of the external iliac vein, likely due to limited inflow from the occlusive sheath in the greater saphenous vein. Additional attempt at retrograde catheter in wire recanalization of the superficial femoral vein was attempted without success. The left greater saphenous vein sheath was then removed and manual compression was applied until hemostasis was achieved. From the IJ approach, and IVC filter retrieval kit was inserted through the indwelling 12 French sheath. The filter apex was snared without complication and over sheath. The IVC filter was removed intact. Repeat inferior vena cavagram was performed which demonstrated widely patent inferior vena cava without evidence of thrombus or wall injury. The right internal jugular vein sheath was then removed and manual compression was applied until hemostasis was achieved. The patient was extubated and transferred to the postanesthesia care unit in stable condition. IMPRESSION: 1. Complete occlusion of the indwelling left iliofemoral stents. Wall adherent thrombus extends into the peripheral inferior vena cava. 2. Successful placement and retrieval of temporary inferior vena cava filter for intra procedural prophylaxis. 3. Successful catheter and wire recanalization of the  occluded indwelling left iliofemoral stents followed by multi station balloon angioplasty and stent renal lining with central extension to cover  the remaining uncovered portion of the left common iliac vein with an 18 mm x 60 mm Wallstent followed by a 18 mm x 90 mm Wallstent to extend coverage to the level of the common femoral vein. 4. Unsuccessful recanalization of the left femoral vein. PLAN: Short-term follow-up with repeat staged recanalization of the left lower extremity venous inflow. Continue therapeutic Lovenox and compression stockings until next procedure. Ruthann Cancer, MD [facility] Radiology Electronically Signed   By: Ruthann Cancer MD   On: 06/08/2020 16:48   IR IVC FILTER PLMT / S&I Burke Keels GUID/MOD SED  Result Date: 06/08/2020 CLINICAL DATA:  65 year old male with 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 He presents today for attempted left iliocaval recanalization. EXAM: 1. Ultrasound-guided venous access of the right internal jugular vein. 2. Inferior vena cavogram. 3. Placement of inferior vena cava filter. 4. Ultrasound-guided venous access of the left greater saphenous vein. 5. Catheter and wire recanalization of left iliofemoral venous stents. 6. Balloon angioplasty of thrombosed left iliofemoral venous stents. 7. Intravascular ultrasound. 8. Left iliac vein stent relining. 9. Balloon angioplasty of inferior vena cava. 10. Completion venography. ANESTHESIA/SEDATION: The procedure was performed under general anesthesia. CONTRAST:  59m OMNIPAQUE IOHEXOL 300 MG/ML SOLN, 848mOMNIPAQUE IOHEXOL 300 MG/ML SOLN, 8075mISIPAQUE IODIXANOL 320 MG/ML IV SOLN, 55m15mSIPAQUE IODIXANOL 320 MG/ML IV SOLN FLUOROSCOPY TIME:  Forty-seven minutes, 503 [id number] COMPLICATIONS: None.  PROCEDURE: The procedure was performed in concert with Dr. JaimCorrie Mckusicke procedure, risks, benefits, and alternatives were explained to the patient. Questions regarding the procedure were encouraged and answered. The patient understands and consents to the procedure. The patient was placed supine on the procedure table. The right IJ, bilateral groins, and left ankle were prepped and draped in standard fashion. A sterile gown and sterile gloves were used for the procedure. Under direct ultrasound guidance, a 21 gauge needle was advanced into the right internal jugular vein with ultrasound image documentation performed. After securing access with a micropuncture dilator, a guidewire was advanced into the inferior vena cava. A deployment sheath was advanced over the guidewire. This was utilized to perform IVC venography. The deployment sheath was further positioned in an appropriate location for filter deployment. A Denali IVC filter was then advanced in the sheath. This was then fully deployed in the infrarenal IVC. Final filter position was confirmed with a fluoroscopic spot image. Contrast injection was also performed through the sheath under fluoroscopy to confirm patency of the IVC at the level of the filter. An exchange length stiff glidewire was then inserted through the IVC filter delivery sheath and into the right common femoral vein. The sheath was exchanged for a 12 [place] cm Ansel sheath. The sheath tip was advanced over the wire to just inferior to the level of the filter struts. Under fluoroscopic guidance, the Glidewire and an angled tip 100 cm 5 French catheter were directed into the central aspect of the indwelling left iliac stents. Catheter wire recanalization was performed without difficulty. Intraluminal position and avoidance of transgression of stent interstices was confirmed with multiple fluoroscopic obliquities. The wire was directed into the central aspect of the greater saphenous  vein. Hand injection venogram was performed. Venogram demonstrated multiple collateral veins in the left groin including ileal lumbar, profundal, and pelvic collaterals. No superficial femoral vein was identified. The indwelling stents were occluded.  Given inability to cannulate the femoral vein from the IJ approach, antegrade access was obtained via the left greater saphenous vein. Under direct ultrasound visualization with permanent documentation, a 21 gauge micropuncture needle was used to access the central aspect of the greater saphenous vein. Micropuncture sheath was inserted through which a Glidewire was placed and directed into the central greater saphenous vein. An 8 [place] by 25 cm vascular sheath was then placed over the Glidewire which in combination with an angled tip catheter, antegrade recanalization of the occluded stents was successful. Over the Glidewire, intravascular ultrasound was performed to evaluate the indwelling stented portion of the left iliac veins. IVUS demonstrated echogenic occlusive thrombus throughout the entirety of the stents. The central aspect of the stent terminated in the central common iliac vein, just peripheral to the confluence. Next, from the IJ approach, balloon angioplasty was performed of the peripheral aspect of the indwelling stents within 8 mm x 40 mm mustang balloon at multiple stations. Next, an exchange length Glidewire was inserted through the antegrade sheath into the retrograde IJ sheath and through and through access was obtained. The left GSV sheath was removed and upsized for a 45 cm 12 French sheath. A Glidewire was then inserted through the antegrade GSV sheath directly into the IJ sheath to establish through and through vascular access. Over the through and through wire balloon angioplasty was performed centrally with a 20 mm x 40 mm atlas balloon in multiple stations. Repeat venogram from the groin access demonstrated improved patency of the indwelling  stents with persistent irregular luminal filling defects and sluggish flow. From the IJ access, a angled tip 5 French catheter was positioned in the right common iliac vein. Simultaneous venogram from the bilateral common iliac veins was performed. This venogram demonstrated patent right iliac system, residual chronic thrombus in the un stented portion of the left common iliac vein stent, and well position temporary IVC filter in unchanged position without evidence of thrombus. Next, a 18 mm by 60 mm wall stent was deployed under fluoroscopic guidance in the common iliac vein. Next, an 18 mm by 90 mm Wallstent was deployed under fluoroscopic guidance from the common iliac vein to the common femoral vein. Balloon molding was then performed with a 20 mm by 40 mm atlas balloon centrally followed by a 12 mm x 40 mm mustang balloon peripherally. Venogram demonstrated significantly improved patency inflow with good wall apposition of the indwelling stents. There was complete absence of the previously visualized preferential collateral outflow veins. There is some sluggish flow in the dependent portion of the external iliac vein, likely due to limited inflow from the occlusive sheath in the greater saphenous vein. Additional attempt at retrograde catheter in wire recanalization of the superficial femoral vein was attempted without success. The left greater saphenous vein sheath was then removed and manual compression was applied until hemostasis was achieved. From the IJ approach, and IVC filter retrieval kit was inserted through the indwelling 12 French sheath. The filter apex was snared without complication and over sheath. The IVC filter was removed intact. Repeat inferior vena cavagram was performed which demonstrated widely patent inferior vena cava without evidence of thrombus or wall injury. The right internal jugular vein sheath was then removed and manual compression was applied until hemostasis was achieved. The  patient was extubated and transferred to the postanesthesia care unit in stable condition. IMPRESSION: 1. Complete occlusion of the indwelling left iliofemoral stents. Wall adherent thrombus extends into the peripheral inferior vena cava.  2. Successful placement and retrieval of temporary inferior vena cava filter for intra procedural prophylaxis. 3. Successful catheter and wire recanalization of the occluded indwelling left iliofemoral stents followed by multi station balloon angioplasty and stent renal lining with central extension to cover the remaining uncovered portion of the left common iliac vein with an 18 mm x 60 mm Wallstent followed by a 18 mm x 90 mm Wallstent to extend coverage to the level of the common femoral vein. 4. Unsuccessful recanalization of the left femoral vein. PLAN: Short-term follow-up with repeat staged recanalization of the left lower extremity venous inflow. Continue therapeutic Lovenox and compression stockings until next procedure. Ruthann Cancer, MD [facility] Radiology Electronically Signed   By: Ruthann Cancer MD   On: 06/08/2020 16:48   NM PET Image Restag (PS) Skull Base To Thigh  Result Date: 06/01/2020 CLINICAL DATA:  Subsequent treatment strategy for urologic carcinoma. Assess response to treatment. EXAM: NUCLEAR MEDICINE PET SKULL BASE TO THIGH TECHNIQUE: 8.1 mCi F-18 FDG was injected intravenously. Full-ring PET imaging was performed from the skull base to thigh after the radiotracer. CT data was obtained and used for attenuation correction and anatomic localization. Fasting blood glucose: 98 mg/dl COMPARISON:  CT 05/18/2020, 03/02/2020 FINDINGS: Mediastinal blood pool activity: SUV max 2.0 Liver activity: SUV max 2.7 NECK: No hypermetabolic lymph nodes in the neck. Incidental CT findings: none CHEST: No hypermetabolic mediastinal or hilar nodes. No suspicious pulmonary nodules on the CT scan. Incidental CT findings:  Paraseptal emphysema and hyperinflated lungs. ABDOMEN/PELVIS: Hypermetabolic mass in the LEFT paraspinal tissue anterior to the sacrum with SUV max equal 7.6. Ill-defined mass at this level measures 2.6 by 2.0 cm. This is site of previously large retroperitoneal mass. A second focus of activity just anterior to the larger mass and adjacent to the venous stent SUV max equal 5.0. Focal activity in the RIGHT upper quadrant associated with the colon with SUV max equal 5.9 on image 135. On the CT portion exam there is mild bowel wall thickening of the colon at this level. No abnormal activity liver. No additional adenopathy. Diffuse activity in stomach is favored physiologic Incidental CT findings: LEFT common iliac vein stent SKELETON: Again noted erosion of the anterior margin of the LEFT sacrum adjacent to hypermetabolic mass. No additional evidence skeletal metastasis. Incidental CT findings: none IMPRESSION: Hypermetabolic tissue in the LEFT retroperitoneum adjacent to the LEFT sacrum and iliac vein stent consistent residual with residual carcinoma. Mass is reduced significantly in size from CT 02/26/2020. Focal activity in the colon at the level of the hepatic flexure with associated bowel wall thickening. This is favored unrelated to the urologic carcinoma but could represent colorectal neoplasm. Recommend endoscopy if not current for screening. Electronically Signed   By: Suzy Bouchard M.D.   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 He presents today for attempted left iliocaval recanalization. EXAM: 1. Ultrasound-guided venous access of the right internal jugular vein. 2. Inferior vena cavogram. 3. Placement of  inferior vena cava filter. 4. Ultrasound-guided venous access of the left greater saphenous vein. 5. Catheter and wire recanalization of left iliofemoral venous stents. 6. Balloon angioplasty of thrombosed left iliofemoral venous stents. 7. Intravascular ultrasound. 8. Left iliac vein stent relining.  9. Balloon angioplasty of inferior vena cava. 10. Completion venography. ANESTHESIA/SEDATION: The procedure was performed under general anesthesia. CONTRAST:  72m OMNIPAQUE IOHEXOL 300 MG/ML SOLN, 829mOMNIPAQUE IOHEXOL 300 MG/ML SOLN, 8018mISIPAQUE IODIXANOL 320 MG/ML IV SOLN, 62m51mSIPAQUE IODIXANOL 320 MG/ML IV SOLN FLUOROSCOPY TIME:  Forty-seven minutes, [id number] COMPLICATIONS: None. PROCEDURE: The procedure was performed in concert with Dr. JaimCorrie Mckusicke procedure, risks, benefits, and alternatives were explained to the patient. Questions regarding the procedure were encouraged and answered. The patient understands and consents to the procedure. The patient was placed supine on the procedure table. The right IJ, bilateral groins, and left ankle were prepped and draped in standard fashion. A sterile gown and sterile gloves were used for the procedure. Under direct ultrasound guidance, a 21 gauge needle was advanced into the right internal jugular vein with ultrasound image documentation performed. After securing access with a micropuncture dilator, a guidewire was advanced into the inferior vena cava. A deployment sheath was advanced over the guidewire. This was utilized to perform IVC venography. The deployment sheath was further positioned in an appropriate location for filter deployment. A Denali IVC filter was then advanced in the sheath. This was then fully deployed in the infrarenal IVC. Final filter position was confirmed with a fluoroscopic spot image. Contrast injection was also performed through the sheath under fluoroscopy to confirm patency of the IVC at the level of the filter. An exchange length  stiff glidewire was then inserted through the IVC filter delivery sheath and into the right common femoral vein. The sheath was exchanged for a 12 [place] cm Ansel sheath. The sheath tip was advanced over the wire to just inferior to the level of the filter struts. Under fluoroscopic guidance, the Glidewire and an angled tip 100 cm 5 French catheter were directed into the central aspect of the indwelling left iliac stents. Catheter wire recanalization was performed without difficulty. Intraluminal position and avoidance of transgression of stent interstices was confirmed with multiple fluoroscopic obliquities. The wire was directed into the central aspect of the greater saphenous vein. Hand injection venogram was performed. Venogram demonstrated multiple collateral veins in the left groin including ileal lumbar, profundal, and pelvic collaterals. No superficial femoral vein was identified. The indwelling stents were occluded. Given inability to cannulate the femoral vein from the IJ approach, antegrade access was obtained via the left greater saphenous vein. Under direct ultrasound visualization with permanent documentation, a 21 gauge micropuncture needle was used to access the central aspect of the greater saphenous vein. Micropuncture sheath was inserted through which a Glidewire was placed and directed into the central greater saphenous vein. An 8 Fren[place]25 cm vascular sheath was then placed over the Glidewire which in combination with an angled tip catheter, antegrade recanalization of the occluded stents was successful. Over the Glidewire, intravascular ultrasound was performed to evaluate the indwelling stented portion of the left iliac veins. IVUS demonstrated echogenic occlusive thrombus throughout the entirety of the stents. The central aspect of the stent terminated in the central common iliac vein, just peripheral to the confluence. Next, from the IJ approach, balloon angioplasty was performed of  the peripheral aspect of the indwelling stents within 8 mm x 40 mm mustang balloon at multiple stations. Next, an exchange length Glidewire was inserted through the antegrade sheath into the retrograde IJ sheath and through and through access was obtained. The left GSV sheath was removed and upsized for a 45 cm 12 French sheath. A Glidewire was then  inserted through the antegrade GSV sheath directly into the IJ sheath to establish through and through vascular access. Over the through and through wire balloon angioplasty was performed centrally with a 20 mm x 40 mm atlas balloon in multiple stations. Repeat venogram from the groin access demonstrated improved patency of the indwelling stents with persistent irregular luminal filling defects and sluggish flow. From the IJ access, a angled tip 5 French catheter was positioned in the right common iliac vein. Simultaneous venogram from the bilateral common iliac veins was performed. This venogram demonstrated patent right iliac system, residual chronic thrombus in the un stented portion of the left common iliac vein stent, and well position temporary IVC filter in unchanged position without evidence of thrombus. Next, a 18 mm by 60 mm wall stent was deployed under fluoroscopic guidance in the common iliac vein. Next, an 18 mm by 90 mm Wallstent was deployed under fluoroscopic guidance from the common iliac vein to the common femoral vein. Balloon molding was then performed with a 20 mm by 40 mm atlas balloon centrally followed by a 12 mm x 40 mm mustang balloon peripherally. Venogram demonstrated significantly improved patency inflow with good wall apposition of the indwelling stents. There was complete absence of the previously visualized preferential collateral outflow veins. There is some sluggish flow in the dependent portion of the external iliac vein, likely due to limited inflow from the occlusive sheath in the greater saphenous vein. Additional attempt at  retrograde catheter in wire recanalization of the superficial femoral vein was attempted without success. The left greater saphenous vein sheath was then removed and manual compression was applied until hemostasis was achieved. From the IJ approach, and IVC filter retrieval kit was inserted through the indwelling 12 French sheath. The filter apex was snared without complication and over sheath. The IVC filter was removed intact. Repeat inferior vena cavagram was performed which demonstrated widely patent inferior vena cava without evidence of thrombus or wall injury. The right internal jugular vein sheath was then removed and manual compression was applied until hemostasis was achieved. The patient was extubated and transferred to the postanesthesia care unit in stable condition. IMPRESSION: 1. Complete occlusion of the indwelling left iliofemoral stents. Wall adherent thrombus extends into the peripheral inferior vena cava. 2. Successful placement and retrieval of temporary inferior vena cava filter for intra procedural prophylaxis. 3. Successful catheter and wire recanalization of the occluded indwelling left iliofemoral stents followed by multi station balloon angioplasty and stent renal lining with central extension to cover the remaining uncovered portion of the left common iliac vein with an 18 mm x 60 mm Wallstent followed by a 18 mm x 90 mm Wallstent to extend coverage to the level of the common femoral vein. 4. Unsuccessful recanalization of the left femoral vein. PLAN: Short-term follow-up with repeat staged recanalization of the left lower extremity venous inflow. Continue therapeutic Lovenox and compression stockings until next procedure. Ruthann Cancer, MD [facility] Radiology Electronically Signed   By: Ruthann Cancer MD   On: 06/08/2020 16:48   IR PTA VENOUS EXCEPT DIALYSIS CIRCUIT  Result Date: 06/09/2020 INDICATION: History of metastatic bladder  cancer. In need of durable intravenous access for chemotherapy administration. EXAM: IMPLANTED PORT A CATH PLACEMENT WITH ULTRASOUND AND FLUOROSCOPIC GUIDANCE COMPARISON:  PET-CT-03/19/2020 MEDICATIONS: Ancef 2 gm IV; The antibiotic was administered within an appropriate time interval prior to skin puncture. ANESTHESIA/SEDATION: Moderate (conscious) sedation was employed during this procedure. A total of Versed 2 mg  and Fentanyl 100 mcg was administered intravenously. Moderate Sedation Time: 25 minutes. The patient's level of consciousness and vital signs were monitored continuously by radiology nursing throughout the procedure under my direct supervision. CONTRAST:  None FLUOROSCOPY TIME:  18 seconds (3 mGy) COMPLICATIONS: None immediate. PROCEDURE: The procedure, risks, benefits, and alternatives were explained to the patient. Questions regarding the procedure were encouraged and answered. The patient understands and consents to the procedure. The right neck and chest were prepped with chlorhexidine in a sterile fashion, and a sterile drape was applied covering the operative field. Maximum barrier sterile technique with sterile gowns and gloves were used for the procedure. A timeout was performed prior to the initiation of the procedure. Local anesthesia was provided with 1% lidocaine with epinephrine. After creating a small venotomy incision, a micropuncture kit was utilized to access the internal jugular vein. Real-time ultrasound guidance was utilized for vascular access including the acquisition of a permanent ultrasound image documenting patency of the accessed vessel. The microwire was utilized to measure appropriate catheter length. A subcutaneous port pocket was then created along the upper chest wall utilizing a combination of sharp and blunt dissection. The pocket was irrigated with sterile saline. A single lumen "Slim" sized power injectable port was chosen for placement. The 8 Fr catheter was tunneled  from the port pocket site to the venotomy incision. The port was placed in the pocket. The external catheter was trimmed to appropriate length. At the venotomy, an 8 Fr peel-away sheath was placed over a guidewire under fluoroscopic guidance. The catheter was then placed through the sheath and the sheath was removed. Final catheter positioning was confirmed and documented with a fluoroscopic spot radiograph. The port was accessed with a Huber needle, aspirated and flushed with heparinized saline. The venotomy site was closed with an interrupted 4-0 Vicryl suture. The port pocket incision was closed with interrupted 2-0 Vicryl suture. Dermabond and Steri-strips were applied to both incisions. Dressings were applied. The patient tolerated the procedure well without immediate post procedural complication. FINDINGS: After catheter placement, the tip lies within the superior cavoatrial junction. The catheter aspirates and flushes normally and is ready for immediate use. IMPRESSION: Successful placement of a right internal jugular approach power injectable Port-A-Cath. The catheter is ready for immediate use. Electronically Signed   By: Sandi Mariscal M.D.   On: 06/01/2020 15:57   ECHOCARDIOGRAM COMPLETE  Result Date: 05/28/2020    ECHOCARDIOGRAM REPORT   Patient Name:   Brian Benton Date of Exam: 05/28/2020 Medical Rec #:  [id number]       Height:       72.0 in Accession #:    [id number]      Weight:       163.0 lb Date of Birth:  05-14-1956       BSA:          1.953 m Patient Age:    54 years        BP:           131/78 mmHg Patient Gender: M               HR:           65 bpm. Exam Location:  Outpatient Procedure: 2D Echo, 3D Echo, Cardiac Doppler, Color Doppler and Strain Analysis Indications:    Z51.11 Encounter for antineoplastic chemotheraphy  History:        Patient has no prior history of Echocardiogram examinations.  Risk Factors:Hypertension, Current Smoker and Dyslipidemia.                  Cancer.  Sonographer:    Roseanna Rainbow Referring Phys: Sabine Comments: Technically difficult study due to poor echo windows and suboptimal parasternal window. Global longitudinal strain was attempted. IMPRESSIONS  1. Left ventricular ejection fraction, by estimation, is 50 to 55%. The left ventricle has low normal function. The left ventricle has no regional wall motion abnormalities. Left ventricular diastolic parameters were normal.  2. Right ventricular systolic function is normal. The right ventricular size is normal.  3. The mitral valve is grossly normal. Trivial mitral valve regurgitation.  4. The aortic valve is grossly normal. Aortic valve regurgitation is not visualized. No aortic stenosis is present. FINDINGS  Left Ventricle: Left ventricular ejection fraction, by estimation, is 50 to 55%. The left ventricle has low normal function. The left ventricle has no regional wall motion abnormalities. The left ventricular internal cavity size was normal in size. There is no left ventricular hypertrophy. Left ventricular diastolic parameters were normal. Right Ventricle: The right ventricular size is normal. No increase in right ventricular wall thickness. Right ventricular systolic function is normal. Left Atrium: Left atrial size was normal in size. Right Atrium: Right atrial size was normal in size. Pericardium: There is no evidence of pericardial effusion. Mitral Valve: The mitral valve is grossly normal. Trivial mitral valve regurgitation. Tricuspid Valve: The tricuspid valve is grossly normal. Tricuspid valve regurgitation is trivial. Aortic Valve: The aortic valve is grossly normal. Aortic valve regurgitation is not visualized. No aortic stenosis is present. Pulmonic Valve: The pulmonic valve was grossly normal. Pulmonic valve regurgitation is not visualized. Aorta: The aortic root and ascending aorta are structurally normal, with no evidence of dilitation. IAS/Shunts: The atrial  septum is grossly normal.  LEFT VENTRICLE PLAX 2D LVIDd:         4.55 cm      Diastology LVIDs:         3.20 cm      LV e' medial:    7.18 cm/s LV PW:         1.40 cm      LV E/e' medial:  7.6 LV IVS:        1.10 cm      LV e' lateral:   7.94 cm/s                             LV E/e' lateral: 6.9  LV Volumes (MOD)            2D Longitudinal Strain LV vol d, MOD A2C: 108.0 ml 2D Strain GLS Avg:     -20.7 % LV vol d, MOD A4C: 119.0 ml LV vol s, MOD A2C: 44.8 ml LV vol s, MOD A4C: 55.7 ml LV SV MOD A2C:     63.2 ml LV SV MOD A4C:     119.0 ml LV SV MOD BP:      63.0 ml RIGHT VENTRICLE             IVC RV S prime:     10.20 cm/s  IVC diam: 1.50 cm TAPSE (M-mode): 2.8 cm LEFT ATRIUM             Index       RIGHT ATRIUM           Index LA diam:  4.30 cm 2.20 cm/m  RA Area:     15.50 cm LA Vol (A2C):   60.2 ml 30.82 ml/m RA Volume:   41.10 ml  21.04 ml/m LA Vol (A4C):   34.1 ml 17.46 ml/m LA Biplane Vol: 44.2 ml 22.63 ml/m  AORTIC VALVE LVOT Vmax:   105.00 cm/s LVOT Vmean:  73.800 cm/s LVOT VTI:    0.199 m  AORTA Ao Root diam: 3.80 cm Ao Asc diam:  3.60 cm MITRAL VALVE MV Area (PHT): 2.99 cm    SHUNTS MV Decel Time: 254 msec    Systemic VTI: 0.20 m MV E velocity: 54.80 cm/s MV A velocity: 65.10 cm/s MV E/A ratio:  0.84 Mertie Moores MD Electronically signed by Mertie Moores MD Signature Date/Time: 05/28/2020/10:25:30 AM    Final    IR INTRAVASCULAR ULTRASOUND NON CORONARY  Result Date: 06/09/2020 INDICATION: History of metastatic bladder cancer. In need of durable intravenous access for chemotherapy administration. EXAM: IMPLANTED PORT A CATH PLACEMENT WITH ULTRASOUND AND FLUOROSCOPIC GUIDANCE COMPARISON:  PET-CT-03/19/2020 MEDICATIONS: Ancef 2 gm IV; The antibiotic was administered within an appropriate time interval prior to skin puncture. ANESTHESIA/SEDATION: Moderate (conscious) sedation was employed during this procedure. A total of Versed 2 mg and Fentanyl 100 mcg was administered intravenously.  Moderate Sedation Time: 25 minutes. The patient's level of consciousness and vital signs were monitored continuously by radiology nursing throughout the procedure under my direct supervision. CONTRAST:  None FLUOROSCOPY TIME:  18 seconds (3 mGy) COMPLICATIONS: None immediate. PROCEDURE: The procedure, risks, benefits, and alternatives were explained to the patient. Questions regarding the procedure were encouraged and answered. The patient understands and consents to the procedure. The right neck and chest were prepped with chlorhexidine in a sterile fashion, and a sterile drape was applied covering the operative field. Maximum barrier sterile technique with sterile gowns and gloves were used for the procedure. A timeout was performed prior to the initiation of the procedure. Local anesthesia was provided with 1% lidocaine with epinephrine. After creating a small venotomy incision, a micropuncture kit was utilized to access the internal jugular vein. Real-time ultrasound guidance was utilized for vascular access including the acquisition of a permanent ultrasound image documenting patency of the accessed vessel. The microwire was utilized to measure appropriate catheter length. A subcutaneous port pocket was then created along the upper chest wall utilizing a combination of sharp and blunt dissection. The pocket was irrigated with sterile saline. A single lumen "Slim" sized power injectable port was chosen for placement. The 8 Fr catheter was tunneled from the port pocket site to the venotomy incision. The port was placed in the pocket. The external catheter was trimmed to appropriate length. At the venotomy, an 8 Fr peel-away sheath was placed over a guidewire under fluoroscopic guidance. The catheter was then placed through the sheath and the sheath was removed. Final catheter positioning was confirmed and documented with a fluoroscopic spot radiograph. The port was accessed with a Huber needle, aspirated and  flushed with heparinized saline. The venotomy site was closed with an interrupted 4-0 Vicryl suture. The port pocket incision was closed with interrupted 2-0 Vicryl suture. Dermabond and Steri-strips were applied to both incisions. Dressings were applied. The patient tolerated the procedure well without immediate post procedural complication. FINDINGS: After catheter placement, the tip lies within the superior cavoatrial junction. The catheter aspirates and flushes normally and is ready for immediate use. IMPRESSION: Successful placement of a right internal jugular approach power injectable Port-A-Cath. The catheter is ready for  immediate use. Electronically Signed   By: Sandi Mariscal M.D.   On: 06/01/2020 15:57   IR IMAGING GUIDED PORT INSERTION  Result Date: 06/01/2020 INDICATION: History of metastatic bladder cancer. In need of durable intravenous access for chemotherapy administration. EXAM: IMPLANTED PORT A CATH PLACEMENT WITH ULTRASOUND AND FLUOROSCOPIC GUIDANCE COMPARISON:  PET-CT-03/19/2020 MEDICATIONS: Ancef 2 gm IV; The antibiotic was administered within an appropriate time interval prior to skin puncture. ANESTHESIA/SEDATION: Moderate (conscious) sedation was employed during this procedure. A total of Versed 2 mg and Fentanyl 100 mcg was administered intravenously. Moderate Sedation Time: 25 minutes. The patient's level of consciousness and vital signs were monitored continuously by radiology nursing throughout the procedure under my direct supervision. CONTRAST:  None FLUOROSCOPY TIME:  18 seconds (3 mGy) COMPLICATIONS: None immediate. PROCEDURE: The procedure, risks, benefits, and alternatives were explained to the patient. Questions regarding the procedure were encouraged and answered. The patient understands and consents to the procedure. The right neck and chest were prepped with chlorhexidine in a sterile fashion, and a sterile drape was applied covering the operative field. Maximum barrier  sterile technique with sterile gowns and gloves were used for the procedure. A timeout was performed prior to the initiation of the procedure. Local anesthesia was provided with 1% lidocaine with epinephrine. After creating a small venotomy incision, a micropuncture kit was utilized to access the internal jugular vein. Real-time ultrasound guidance was utilized for vascular access including the acquisition of a permanent ultrasound image documenting patency of the accessed vessel. The microwire was utilized to measure appropriate catheter length. A subcutaneous port pocket was then created along the upper chest wall utilizing a combination of sharp and blunt dissection. The pocket was irrigated with sterile saline. A single lumen "Slim" sized power injectable port was chosen for placement. The 8 Fr catheter was tunneled from the port pocket site to the venotomy incision. The port was placed in the pocket. The external catheter was trimmed to appropriate length. At the venotomy, an 8 Fr peel-away sheath was placed over a guidewire under fluoroscopic guidance. The catheter was then placed through the sheath and the sheath was removed. Final catheter positioning was confirmed and documented with a fluoroscopic spot radiograph. The port was accessed with a Huber needle, aspirated and flushed with heparinized saline. The venotomy site was closed with an interrupted 4-0 Vicryl suture. The port pocket incision was closed with interrupted 2-0 Vicryl suture. Dermabond and Steri-strips were applied to both incisions. Dressings were applied. The patient tolerated the procedure well without immediate post procedural complication. FINDINGS: After catheter placement, the tip lies within the superior cavoatrial junction. The catheter aspirates and flushes normally and is ready for immediate use. IMPRESSION: Successful placement of a right internal jugular approach power injectable Port-A-Cath. The catheter is ready for immediate  use. Electronically Signed   By: Sandi Mariscal M.D.   On: 06/01/2020 15:57   IR Radiologist Eval & Mgmt  Result Date: 05/27/2020 Please refer to notes tab for details about interventional procedure. 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 EXAM: CTA ABDOMEN AND PELVIS WITH CONTRAST TECHNIQUE: Multidetector CT imaging of the abdomen and pelvis was performed using the standard protocol during and after bolus administration of intravenous contrast. Multiplanar reconstructed images and MIPs were  obtained and reviewed to evaluate the vascular anatomy. CONTRAST:  169m OMNIPAQUE IOHEXOL 350 MG/ML SOLN COMPARISON:  03/02/2020, 03/20/2019 FINDINGS: VASCULAR IVC: The suprarenal inferior vena cava is patent and normal in caliber. Approximately 4.5 cm inferior to the level of the left renal vein is nonocclusive thrombus extending from the left iliac vein into the inferior vena cava, eccentricly favoring the left side of the IVC. Renal veins: Widely patent. Right iliofemoral veins: Normal caliber and patent throughout. Left iliofemoral veins: Indwelling stents throughout the left common iliac, external iliac, and common femoral vein terminating just central to the saphenofemoral junction. This stents are occluded throughout. In the proximal external iliac vein, there is a free-floating wall of the indwelling stent, best visualized on sagittal series 4, images 99-103. The central superficial and profunda femoral veins appear patent, slightly enlarged compared to the right, likely secondary to congestion. There are no appreciable major intrapelvic or ascending lumbar collateral veins appreciated. Portal system: Widely patent common normal  caliber. Hepatic veins: Widely patent. Arterial system: Scattered atherosclerotic calcifications throughout the abdominal aorta, most prominent in the infrarenal portion with mild fusiform ectasia of the infrarenal abdominal aorta measuring up to 2.6 cm. The inflow proximal outflow vessels appear patent. No evidence of visceral ostial occlusion. Review of the MIP images confirms the above findings. NON-VASCULAR Lower chest: Similar appearing linear atelectasis in the bilateral lower lobes. Moderate centrilobular emphysema. The heart is normal in size. No pericardial effusion. Hepatobiliary: No focal liver abnormality is seen. No gallstones, gallbladder wall thickening, or biliary dilatation. Pancreas: Unremarkable. No pancreatic ductal dilatation or surrounding inflammatory changes. Spleen: Normal in size without focal abnormality. Adrenals/Urinary Tract: Adrenal glands are unremarkable. Simple appearing multifocal simple renal cysts, the largest about the left inferior pole, partially exophytic medially, measuring up to 2.5 cm. Kidneys are otherwise normal, without renal calculi, focal lesion, or hydronephrosis. Bladder is nondistended. Stomach/Bowel: Stomach is within normal limits. Appendix appears normal. Large stool burden. No evidence of bowel wall thickening, distention, or inflammatory changes. Lymphatic: No abdominopelvic adenopathy. Reproductive: Prostate is unremarkable. Other: Continued decreased size of previously visualized left lower paraspinal mass now measuring up to approximately 5.6 x 2.6 cm in maximum axial dimension, approximately 6.7 x 3.4 cm at the same location on 03/19/2020 PET-CT by my measurements. There is similar appearing associated cortical destruction of the superior, anterior sacral ala at this level. No ascites. Musculoskeletal: No acute abnormality. IMPRESSION: VASCULAR Complete occlusion of the indwelling left iliofemoral venous stent with extension of nonocclusive thrombus into  the infrarenal inferior vena cava. Aortic Atherosclerosis (ICD10-I70.0). NON-VASCULAR Suggestion favorable treatment response with continued decreased size of the previously visualized left lower paraspinal mass. Unchanged associated osseous destruction of the anterior aspect superior left sacral ala. DRuthann Cancer MD [facility] Electronically Signed   By: DRuthann CancerMD   On: 05/18/2020 09:42    Labs:  CBC: Recent Labs    05/20/20 0807 06/01/20 1217 06/02/20 0815 06/08/20 0725  WBC 5.1 4.9 5.6 19.1*  HGB 11.6* 11.6* 11.1* 10.4*  HCT 35.6* 36.2* 33.3* 30.4*  PLT 287 190 188 111*    COAGS: Recent Labs    03/02/20 0105 06/01/20 1217 06/08/20 0725  INR 1.0 1.1 0.9    BMP: Recent Labs    03/02/20 0105 03/03/20 0733 03/04/20 0337 03/11/20 0813 03/18/20 0858 04/08/20 0846 05/18/20 0835 05/20/20 0807 06/02/20 0815 06/08/20 0725  NA 135 134* 134* 134*   < > 135  --  139 138  137  K 5.1 4.8 4.1 4.8   < > 4.4  --  3.9 3.9 3.8  CL 98 98 100 98   < > 103  --  104 105 101  CO2 '28 25 26 ' 32   < > 27  --  '29 28 26  ' GLUCOSE 119* 121* 131* 94   < > 96  --  117* 83 100*  BUN '21 18 13 14   ' < > 12  --  '16 15 13  ' CALCIUM 9.3 9.3 8.7* 10.2   < > 9.3  --  10.2 10.2 9.1  CREATININE 0.94 0.74 0.69 0.72   < > 0.66 0.80 0.89 0.91 0.84  GFRNONAA >60 >60 >60 >60   < > >60  --  >60 >60 >60  GFRAA >60 >60 >60 >60  --   --   --   --   --   --    < > = values in this interval not displayed.    LIVER FUNCTION TESTS: Recent Labs    04/01/20 0937 04/08/20 0846 05/20/20 0807 06/02/20 0815  BILITOT 0.3 0.4 0.3 0.3  AST 12* 11* 15 13*  ALT '16 14 15 13  ' ALKPHOS 82 77 42 37*  PROT 7.0 6.8 7.0 6.8  ALBUMIN 3.7 3.3* 4.1 4.0    TUMOR MARKERS: No results for input(s): AFPTM, CEA, CA199, CHROMGRNA in the last 8760 hours.  Assessment and Plan:  Metastatic bladder cancer; severe post-thrombotic syndrome/left lower extremity DVT:  Percell Miller T. 16, 65 year old male, presents today to the [facility] department for an image-guided recanalization of the left femoral vein with possible angioplasty, thrombectomy and stent placement.   Risks and benefits of an image-guided recanalization of the left femoral vein with possible angioplasty, thrombectomy and stent placement were discussed with the patient including, but not limited to bleeding, infection, contrast induced renal failure, damage or irritation to adjacent structures and possible vascular injury.   This interventional procedure involves the use of X-rays and because of the nature of the planned procedure, it is possible that we will have prolonged use of X-ray fluoroscopy.  Potential radiation risks to you include (but are not limited to) the following: - A slightly elevated risk for cancer several years later in life. This risk is typically less than  0.5% percent. This risk is low in comparison to the normal incidence of human cancer, which is 33% for women and 50% for men according to the [place]. - Radiation induced injury can include skin redness, resembling a rash, tissue breakdown/ulcers and hair loss (which can be temporary or permanent).   The likelihood of either of these occurring depends on the difficulty of the procedure and whether you are sensitive to radiation due to previous procedures, disease, or genetic conditions.   IF your procedure requires a prolonged use of radiation, you will be notified and given written instructions for further action.  It is your responsibility to monitor the irradiated area for the 2 weeks following the procedure and to notify your physician if you are concerned that you have suffered a radiation induced injury.    All of the patient's questions were answered, patient is agreeable to proceed.  Brian Benton has been  NPO. Vitals have been reviewed. Labs are pending but will be  reviewed prior to the start of the procedure. He takes lovenox and aspirin daily, last dose 06/16/20.  Consent signed and in chart.  Thank you for this interesting  consult.  I greatly enjoyed meeting Brian Benton and look forward to participating in their care.  A copy of this report was sent to the requesting provider on this date.  Electronically Signed: Soyla Dryer, AGACNP-BC [phone] 06/17/2020, 7:57 AM   I spent a total of    25 Minutes in face to face in clinical consultation, greater than 50% of which was counseling/coordinating care for severe post-thrombotic syndrome; left lower extremity DVT

## 2020-06-17 ENCOUNTER — Other Ambulatory Visit: Payer: Self-pay

## 2020-06-17 ENCOUNTER — Other Ambulatory Visit: Payer: Self-pay | Admitting: Family

## 2020-06-17 ENCOUNTER — Other Ambulatory Visit: Payer: Self-pay | Admitting: *Deleted

## 2020-06-17 ENCOUNTER — Ambulatory Visit (HOSPITAL_COMMUNITY)
Admission: RE | Admit: 2020-06-17 | Discharge: 2020-06-17 | Disposition: A | Discharge status: Home / Self Care | Payer: 59 | Source: Ambulatory Visit | Attending: Interventional Radiology | Admitting: Interventional Radiology

## 2020-06-17 ENCOUNTER — Inpatient Hospital Stay: Payer: 59

## 2020-06-17 ENCOUNTER — Encounter: Payer: Self-pay | Admitting: *Deleted

## 2020-06-17 ENCOUNTER — Other Ambulatory Visit (HOSPITAL_COMMUNITY): Payer: Self-pay | Admitting: Interventional Radiology

## 2020-06-17 ENCOUNTER — Inpatient Hospital Stay: Payer: 59 | Attending: Hematology & Oncology

## 2020-06-17 DIAGNOSIS — F17210 Nicotine dependence, cigarettes, uncomplicated: Secondary | ICD-10-CM | POA: Insufficient documentation

## 2020-06-17 DIAGNOSIS — D649 Anemia, unspecified: Secondary | ICD-10-CM

## 2020-06-17 DIAGNOSIS — I82422 Acute embolism and thrombosis of left iliac vein: Secondary | ICD-10-CM | POA: Diagnosis present

## 2020-06-17 DIAGNOSIS — I82419 Acute embolism and thrombosis of unspecified femoral vein: Secondary | ICD-10-CM

## 2020-06-17 DIAGNOSIS — Z7982 Long term (current) use of aspirin: Secondary | ICD-10-CM | POA: Insufficient documentation

## 2020-06-17 DIAGNOSIS — C7911 Secondary malignant neoplasm of bladder: Secondary | ICD-10-CM | POA: Insufficient documentation

## 2020-06-17 DIAGNOSIS — C679 Malignant neoplasm of bladder, unspecified: Secondary | ICD-10-CM | POA: Insufficient documentation

## 2020-06-17 DIAGNOSIS — Z5111 Encounter for antineoplastic chemotherapy: Secondary | ICD-10-CM | POA: Insufficient documentation

## 2020-06-17 DIAGNOSIS — Z5189 Encounter for other specified aftercare: Secondary | ICD-10-CM | POA: Insufficient documentation

## 2020-06-17 DIAGNOSIS — C799 Secondary malignant neoplasm of unspecified site: Secondary | ICD-10-CM | POA: Diagnosis not present

## 2020-06-17 DIAGNOSIS — Z79899 Other long term (current) drug therapy: Secondary | ICD-10-CM | POA: Insufficient documentation

## 2020-06-17 HISTORY — PX: IR THROMBECT VENO MECH MOD SED: IMG2300

## 2020-06-17 HISTORY — PX: IR INTRAVASCULAR ULTRASOUND NON CORONARY: IMG6085

## 2020-06-17 HISTORY — PX: IR TRANSCATH PLC STENT  INITIAL VEIN  INC ANGIOPLASTY: IMG5445

## 2020-06-17 HISTORY — PX: IR US GUIDE VASC ACCESS LEFT: IMG2389

## 2020-06-17 HISTORY — PX: IR PTA VENOUS EXCEPT DIALYSIS CIRCUIT: IMG6126

## 2020-06-17 HISTORY — PX: IR VENO/EXT/UNI LEFT: IMG675

## 2020-06-17 LAB — PROTIME-INR
INR: 0.9 (ref 0.8–1.2)
Prothrombin Time: 11.7 seconds (ref 11.4–15.2)

## 2020-06-17 LAB — BASIC METABOLIC PANEL
Anion gap: 10 (ref 5–15)
BUN: 17 mg/dL (ref 8–23)
CO2: 23 mmol/L (ref 22–32)
Calcium: 8.9 mg/dL (ref 8.9–10.3)
Chloride: 103 mmol/L (ref 98–111)
Creatinine, Ser: 1.1 mg/dL (ref 0.61–1.24)
GFR, Estimated: >60 mL/min (ref >60)
Glucose, Bld: 105 mg/dL — ABNORMAL HIGH (ref 70–99)
Potassium: 3.8 mmol/L (ref 3.5–5.1)
Sodium: 136 mmol/L (ref 135–145)

## 2020-06-17 LAB — CBC
HCT: 23 % — ABNORMAL LOW (ref 39.0–52.0)
Hemoglobin: 7.3 g/dL — ABNORMAL LOW (ref 13.0–17.0)
MCH: 34.8 pg — ABNORMAL HIGH (ref 26.0–34.0)
MCHC: 31.7 g/dL (ref 30.0–36.0)
MCV: 109.5 fL — ABNORMAL HIGH (ref 80.0–100.0)
Platelets: 284 10*3/uL (ref 150–400)
RBC: 2.1 MIL/uL — ABNORMAL LOW (ref 4.22–5.81)
RDW: 19.8 % — ABNORMAL HIGH (ref 11.5–15.5)
WBC: 13.6 10*3/uL — ABNORMAL HIGH (ref 4.0–10.5)
nRBC: 0.2 % (ref 0.0–0.2)

## 2020-06-17 LAB — POCT ACTIVATED CLOTTING TIME
Activated Clotting Time: 208 seconds
Activated Clotting Time: 214 seconds

## 2020-06-17 LAB — PREPARE RBC (CROSSMATCH)

## 2020-06-17 MED ORDER — SODIUM CHLORIDE 0.9 % IV SOLN: INTRAVENOUS | Status: DC

## 2020-06-17 MED ORDER — IOHEXOL 300 MG/ML  SOLN
100.0000 mL | Freq: Once | INTRAMUSCULAR | Status: AC | PRN
  Administered 2020-06-17: 50 mL via INTRAVENOUS

## 2020-06-17 MED ORDER — HEPARIN SODIUM (PORCINE) 1000 UNIT/ML IJ SOLN
INTRAMUSCULAR | Status: AC
  Administered 2020-06-17: 10000 [IU] via INTRAVENOUS
  Filled 2020-06-17: qty 2

## 2020-06-17 MED ORDER — FENTANYL CITRATE (PF) 100 MCG/2ML IJ SOLN
INTRAMUSCULAR | Status: AC
  Filled 2020-06-17: qty 4

## 2020-06-17 MED ORDER — SODIUM CHLORIDE 0.9% FLUSH
10.0000 mL | Freq: Once | INTRAVENOUS | Status: AC
  Administered 2020-06-17: 10 mL via INTRAVENOUS
  Filled 2020-06-17: qty 10

## 2020-06-17 MED ORDER — HEPARIN SOD (PORK) LOCK FLUSH 100 UNIT/ML IV SOLN
500.0000 [IU] | Freq: Once | INTRAVENOUS | Status: AC
  Administered 2020-06-17: 500 [IU] via INTRAVENOUS
  Filled 2020-06-17: qty 5

## 2020-06-17 MED ORDER — MIDAZOLAM HCL 2 MG/2ML IJ SOLN
INTRAMUSCULAR | Status: AC | PRN
Start: 2020-06-17 — End: 2020-06-17
  Administered 2020-06-17 (×3): 0.5 mg via INTRAVENOUS

## 2020-06-17 MED ORDER — MIDAZOLAM HCL 2 MG/2ML IJ SOLN
INTRAMUSCULAR | Status: AC
  Filled 2020-06-17: qty 4

## 2020-06-17 MED ORDER — HEPARIN SODIUM (PORCINE) 1000 UNIT/ML IJ SOLN
INTRAMUSCULAR | Status: AC | PRN
  Administered 2020-06-17: 2000 [IU] via INTRAVENOUS

## 2020-06-17 MED ORDER — SODIUM CHLORIDE 0.9 % IV SOLN: INTRAVENOUS | Status: AC

## 2020-06-17 MED ORDER — IOHEXOL 300 MG/ML  SOLN
100.0000 mL | Freq: Once | INTRAMUSCULAR | Status: AC | PRN
  Administered 2020-06-17: 20 mL via INTRAVENOUS

## 2020-06-17 MED ORDER — ENOXAPARIN SODIUM 80 MG/0.8ML ~~LOC~~ SOLN
80.0000 mg | Freq: Once | SUBCUTANEOUS | Status: AC
  Administered 2020-06-17: 80 mg via SUBCUTANEOUS
  Filled 2020-06-17: qty 0.8

## 2020-06-17 MED ORDER — LIDOCAINE HCL 1 % IJ SOLN
INTRAMUSCULAR | Status: AC
  Filled 2020-06-17: qty 20

## 2020-06-17 MED ORDER — FENTANYL CITRATE (PF) 100 MCG/2ML IJ SOLN
INTRAMUSCULAR | Status: AC | PRN
  Administered 2020-06-17 (×3): 25 ug via INTRAVENOUS

## 2020-06-17 NOTE — Procedures (Signed)
Interventional Radiology Procedure Note  Procedure:   US guided left posterior tibial vein/popliteal vein access. LLE Venogram Prolonged PTA occluded left femoral vein Angiojet thrombectomy in-stent thrombus PTA left iliac venous system  Findings: Occluded left femoral vein on initial image. Patent left femoral vein at conclusion with in-line flow from PT vein to the IVC.  44m prolonged PTA Patent iliac stent system  Complications: None  Recommendations:  - Weight based lovenox dose now, on completion, with continuation of home dose this evening on schedule - VIR recommends at least 30 days of continued [facility]enox [facility] then consideration of transition to oral AC of choice, per Heme/Onc - Continue daily ASA 819m- 2 hour recovery - NS 150cc/hr x 2 hour - advance diet - encourage PO fluids, targeting 84 oz per day water - DC home 2 hours when goals met - Do not submerge for 7 days - Follow up with Dr. WaEarleen Newportn clinic 4-6 weeks.  No imaging needed  Signed,  JaDulcy FannyWaEarleen NewportDO

## 2020-06-17 NOTE — Sedation Documentation (Signed)
Procedure complete. Called Short Stay unit to attempt to call report, no RN available. Awaiting RN to call back to have patient transfer to recovery unit.

## 2020-06-17 NOTE — Sedation Documentation (Signed)
Transported patient to [facility] Unit, bedside handoff given to [place], Charity fundraiser. Dressing assessed by both RNs. Dressing is clean, dry, and intact. No hematoma noted. Distal pulses palpable.

## 2020-06-17 NOTE — Progress Notes (Signed)
Received a message from RN in IR, Ardeen Fillers, stating that patient was in IR for a procedure and that his hemoglobin had dropped from 10.4 to 7.3 over the course of a week. There was no report of patient symptoms.   Spoke to [facility] NP and she would like patient to come in today after his procedure for a T&C for blood transfusion tomorrow.   After several attempts, contact with patient was made. He will be discharged from short stay around 1330 and then come to our office for T&C. He knows he needs a blood transfusion due to anemia. We will discuss with him in further detail when he comes to the office.   Oncology Nurse Navigator Documentation  Oncology Nurse Navigator Flowsheets 06/17/2020  Abnormal Finding Date -  Confirmed Diagnosis Date -  Diagnosis Status -  Planned Course of Treatment -  Phase of Treatment -  Chemotherapy Pending- Reason: -  Chemo/Radiation Concurrent Actual Start Date: -  Navigator Follow Up Date: 06/24/2020  Navigator Follow Up Reason: Follow-up Appointment;Chemotherapy  Navigator Location [facility]  Referral Date to RadOnc/MedOnc -  Navigator Encounter Type Diagnostic Results;MyChart;Telephone  Telephone Outgoing Call  Treatment Initiated Date -  Patient Visit Type MedOnc  Treatment Phase Active Tx  Barriers/Navigation Needs Coordination of Care;Education  Education Pain/ Symptom Management  Interventions Coordination of Care;Education;Psycho-Social Support  Acuity Level 2-Minimal Needs (1-2 Barriers Identified)  Referrals -  Coordination of Care Appts  Education Method Verbal  Support Groups/Services Friends and Family  Time Spent with Patient 60

## 2020-06-17 NOTE — Discharge Instructions (Signed)
Venogram A venogram, or venography, is a procedure that uses an X-ray and dye (contrast) to examine how well the veins work and how blood flows through them. Contrast helps the veins show up on X-rays. A venogram may be done:  To evaluate abnormalities in the vein.  To identify clots within veins, such as deep vein thrombosis (DVT).  To map out the veins that might be needed for another procedure. Tell a health care provider about:  Any allergies you have, especially to medicines, shellfish, iodine, and contrast.  All medicines you are taking, including vitamins, herbs, eye drops, creams, and over-the-counter medicines.  Any problems you or family members have had with anesthetic medicines.  Any blood disorders you have.  Any surgeries you have had and any complications that occurred.  Any medical conditions you have.  Whether you are pregnant, may be pregnant, or are breastfeeding.  Any history of smoking or tobacco use. What are the risks? Generally, this is a safe procedure. However, problems may occur, including:  Infection.  Bleeding.  Blood clots.  Allergic reaction to medicines or contrast.  Damage to other structures or organs.  Kidney problems.  Increased risk of cancer. Being exposed to too much radiation over a lifetime can increase the risk of cancer. The risk is small. What happens before the procedure? Medicines Ask your health care provider about:  Changing or stopping your regular medicines. This is especially important if you are taking diabetes medicines or blood thinners.  Taking medicines such as aspirin and ibuprofen. These medicines can thin your blood. Do not take these medicines unless your health care provider tells you to take them.  Taking over-the-counter medicines, vitamins, herbs, and supplements. General instructions  Follow instructions from your health care provider about eating or drinking restrictions.  You may have blood tests  to check how well your kidneys and liver are working and how well your blood can clot.  Plan to have someone take you home from the hospital or clinic. What happens during the procedure?   An IV will be inserted into one of your veins.  You may be given a medicine to help you relax (sedative).  You will lie down on an X-ray table. The table may be tilted in different directions during the procedure to help the contrast move throughout your body. Safety straps will keep you secure if the table is tilted.  If veins in your arm or leg will be examined, a band may be wrapped around that arm or leg to keep the veins full of blood. This may cause your arm or leg to feel numb.  The contrast will be injected into your IV. You may have a hot, flushed feeling as it moves throughout your body. You may also have a metallic taste in your mouth. Both of these sensations will go away after the test is complete.  You may be asked to lie in different positions or place your legs or arms in different positions.  At the end of the procedure, you may be given IV fluids to help wash or flush the contrast out of your veins.  The IV will be removed, and pressure will be applied to the IV site to prevent bleeding. A bandage (dressing) may be applied to the IV site. The exact procedure may vary among health care providers and hospitals. What can I expect after the procedure?  Your blood pressure, heart rate, breathing rate, and blood oxygen level will be monitored until you   leave the hospital or clinic.  You may be given something to eat and drink.  You may have bruising or mild discomfort in the area where the IV was inserted. Follow these instructions at home: Eating and drinking   Follow instructions from your health care provider about eating or drinking restrictions.  Drink a lot of water for the first several days after the procedure, as directed by your health care provider. This helps to flush the  contrast out of your body. Activity  Rest as told by your health care provider.  Return to your normal activities as told by your health care provider. Ask your health care provider what activities are safe for you.  If you were given a sedative during your procedure, do not drive for 24 hours or until your health care provider approves. General instructions  Check your IV insertion area every day for signs of infection. Check for: ? Redness, swelling, or pain. ? Fluid or blood. ? Warmth. ? Pus or a bad smell.  Take over-the-counter and prescription medicines only as told by your health care provider.  Keep all follow-up visits as told by your health care provider. This is important. Contact a health care provider if:  Your skin becomes itchy or you develop a rash or hives.  You have a fever that does not get better with medicine.  You feel nauseous or you vomit.  You have redness, swelling, or pain around the insertion site.  You have fluid or blood coming from the insertion site.  Your insertion area feels warm to the touch.  You have pus or a bad smell coming from the insertion site. Get help right away if you:  Have shortness of breath or difficulty breathing.  Develop chest pain.  Faint.  Feel very dizzy. These symptoms may represent a serious problem that is an emergency. Do not wait to see if the symptoms will go away. Get medical help right away. Call your local emergency services (911 in the [place] Do not drive yourself to the hospital. Summary  A venogram, or venography, is a procedure that uses an X-ray and contrast dye to check how well the veins work and how blood flows through them.  An IV will be inserted into one of your veins in order to inject the contrast.  During the exam, you will lie on an X-ray table. The table may be tilted in different directions during the procedure to help the contrast move throughout your body. Safety straps will keep you  secure.  After the procedure, you will need to drink a lot of water to help wash or flush the contrast out of your body. This information is not intended to replace advice given to you by your health care provider. Make sure you discuss any questions you have with your health care provider. Document Revised: 01/05/2019 Document Reviewed: 01/05/2019 [employer] Patient Education  2020 [employer]  

## 2020-06-17 NOTE — Patient Instructions (Signed)

## 2020-06-18 ENCOUNTER — Inpatient Hospital Stay (HOSPITAL_BASED_OUTPATIENT_CLINIC_OR_DEPARTMENT_OTHER): Payer: 59 | Admitting: Hematology & Oncology

## 2020-06-18 ENCOUNTER — Inpatient Hospital Stay: Payer: 59

## 2020-06-18 ENCOUNTER — Encounter: Payer: Self-pay | Admitting: Hematology & Oncology

## 2020-06-18 ENCOUNTER — Other Ambulatory Visit: Payer: Self-pay | Admitting: Family

## 2020-06-18 DIAGNOSIS — C679 Malignant neoplasm of bladder, unspecified: Secondary | ICD-10-CM

## 2020-06-18 DIAGNOSIS — D649 Anemia, unspecified: Secondary | ICD-10-CM

## 2020-06-18 DIAGNOSIS — C799 Secondary malignant neoplasm of unspecified site: Secondary | ICD-10-CM

## 2020-06-18 DIAGNOSIS — Z5111 Encounter for antineoplastic chemotherapy: Secondary | ICD-10-CM | POA: Diagnosis not present

## 2020-06-18 LAB — ABO/RH: ABO/RH(D): A POS

## 2020-06-18 MED ORDER — SODIUM CHLORIDE 0.9% FLUSH
10.0000 mL | Freq: Once | INTRAVENOUS | Status: AC
  Administered 2020-06-18: 10 mL via INTRAVENOUS
  Filled 2020-06-18: qty 10

## 2020-06-18 MED ORDER — HEPARIN SOD (PORK) LOCK FLUSH 100 UNIT/ML IV SOLN
500.0000 [IU] | Freq: Once | INTRAVENOUS | Status: AC
  Administered 2020-06-18: 500 [IU] via INTRAVENOUS
  Filled 2020-06-18: qty 5

## 2020-06-18 NOTE — Progress Notes (Signed)
Hematology and Oncology Follow Up Visit  DENG MCNEFF [id number] [birth year] 65 y.o. 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 Lana is back for an earlier visit.  He looks fantastic.  He was seen by interventional radiology the past week or so.  The left leg looks incredible.  He had work done on the thrombus.  He had a thrombectomy done via one of the radiology "tools."  There is really no swelling in the leg.  He had a lot of bleeding afterwards.  He is on Lovenox.  His hemoglobin was down to 7.3.  Because of this, we have been for a transfusion.  I just want to make sure that he was doing okay.  He actually looks tremendous.  He is not having any problems pain wise.  He is walking.  Not using a walker.  Again, the left leg really feels good.  I am just incredibly impressed with the abilities of interventional radiology.  They were able to improve his quality of life so nicely.  He is eating better.  Had a nice Christmas.  He is not having any problems with nausea or vomiting.  He actually tolerated the first cycle of chemotherapy pretty well.  He did have a PET scan done on 05/29/2020.  This showed significantly reduced mass in the left retroperitoneum adjacent to the left sacrum.  The mass measured 2.6 x 2 cm.  The SUV of 7.6.  There is some erosion of the left sacrum.    Overall, I would say his performance status is ECOG 1.  Medications:  Current Outpatient Medications:  .  ASPIRIN ADULT LOW STRENGTH 81 MG EC tablet, TAKE 1 TABLET BY MOUTH EVERY DAY (Patient taking differently: Take 81 mg by mouth daily.), Disp: 30  tablet, Rfl: 11 .  Calcium Carbonate-Vit D-Min (CALTRATE PLUS PO), Take 1 tablet by mouth daily., Disp: , Rfl:  .  enoxaparin (LOVENOX) 80 MG/0.8ML injection, Inject 0.8 mLs (80 mg total) into the skin every 12 (twelve) hours., Disp: 19.2 mL, Rfl: 6 .  fentaNYL (DURAGESIC) 50 MCG/HR, PLACE 1 PATCH ONTO THE SKIN EVERY 3 (THREE) DAYS., Disp: 10 patch, Rfl: 0 .  gabapentin (NEURONTIN) 600 MG tablet, Take 1 tablet (600 mg total) by mouth in the morning, at noon, in the evening, and at bedtime., Disp: 120 tablet, Rfl: 3 .  lidocaine-prilocaine (EMLA) cream, Apply to affected area once (Patient taking differently: Apply 1 application topically once. Apply to affected area once), Disp: 30 g, Rfl: 3 .  LORazepam (ATIVAN) 0.5 MG tablet, Take 1 tablet (0.5 mg total) by mouth every 6 (six) hours as needed (Nausea or vomiting)., Disp: 30 tablet, Rfl: 0 .  meloxicam (MOBIC) 15 MG tablet, Take 1 tablet (15 mg total) by mouth daily., Disp: 30 tablet, Rfl: 3 .  Multiple Vitamins-Minerals (MULTIVITAMIN WITH MINERALS) tablet, Take 1 tablet by mouth daily., Disp: , Rfl:  .  ondansetron (ZOFRAN ODT) 8 MG disintegrating tablet, Take 1 tablet (8 mg total) by mouth every 8 (eight) hours as needed for nausea or vomiting., Disp: 30 tablet, Rfl: 0 .  Oxycodone HCl 10  MG TABS, TAKE 1 TABLET (10 MG TOTAL) BY MOUTH EVERY 4 (FOUR) HOURS AS NEEDED., Disp: 90 tablet, Rfl: 0 .  sodium chloride (OCEAN) 0.65 % SOLN nasal spray, Place 1 spray into both nostrils daily as needed for congestion (Allergies)., Disp: , Rfl:  .  zolpidem (AMBIEN) 10 MG tablet, TAKE 1 TABLET (10 MG TOTAL) BY MOUTH AT BEDTIME AS NEEDED FOR SLEEP., Disp: 30 tablet, Rfl: 0  Allergies: No Known Allergies  Past Medical History, Surgical history, Social history, and Family History were reviewed and updated.  Review of Systems: Review of Systems  Constitutional: Negative.   HENT:  Negative.   Eyes: Negative.   Respiratory: Negative.   Cardiovascular: Positive  for leg swelling.  Gastrointestinal: Negative.   Endocrine: Negative.   Musculoskeletal: Positive for flank pain.  Skin: Negative.   Neurological: Negative.   Hematological: Negative.   Psychiatric/Behavioral: Negative.     Physical Exam:  vitals were not taken for this visit.   Wt Readings from Last 3 Encounters:  06/17/20 165 lb (74.8 kg)  06/08/20 165 lb (74.8 kg)  05/20/20 163 lb (73.9 kg)    Physical Exam Vitals reviewed.  HENT:     Head: Normocephalic and atraumatic.  Eyes:     Pupils: Pupils are equal, round, and reactive to light.  Cardiovascular:     Rate and Rhythm: Normal rate and regular rhythm.     Heart sounds: Normal heart sounds.  Pulmonary:     Effort: Pulmonary effort is normal.     Breath sounds: Normal breath sounds.  Abdominal:     General: Bowel sounds are normal.     Palpations: Abdomen is soft.  Musculoskeletal:        General: No tenderness or deformity. Normal range of motion.     Cervical back: Normal range of motion.     Comments: Extremities shows minimal swelling of the left leg.  He has good range of motion of the leg.  He has good pulses in his distal extremities.  The right leg is unremarkable.  Lymphadenopathy:     Cervical: No cervical adenopathy.  Skin:    General: Skin is warm and dry.     Findings: No erythema or rash.  Neurological:     Mental Status: He is alert and oriented to person, place, and time.  Psychiatric:        Behavior: Behavior normal.        Thought Content: Thought content normal.        Judgment: Judgment normal.    Lab Results  Component Value Date   WBC 13.6 (H) 06/17/2020   HGB 7.3 (L) 06/17/2020   HCT 23.0 (L) 06/17/2020   MCV 109.5 (H) 06/17/2020   PLT 284 06/17/2020     Chemistry      Component Value Date/Time   NA 136 06/17/2020 0730   K 3.8 06/17/2020 0730   CL 103 06/17/2020 0730   CO2 23 06/17/2020 0730   BUN 17 06/17/2020 0730   CREATININE 1.10 06/17/2020 0730   CREATININE 0.91  06/02/2020 0815      Component Value Date/Time   CALCIUM 8.9 06/17/2020 0730   ALKPHOS 37 (L) 06/02/2020 0815   AST 13 (L) 06/02/2020 0815   ALT 13 06/02/2020 0815   BILITOT 0.3 06/02/2020 0815       Impression and Plan: Mr. Tesfaye is a really nice 65 year old white male.  He has a very interesting problem.  He had in situ bladder  cancer about 5 years ago.  Now, he has a recurrence that I would consider metastatic.  This is outside of the bladder.  It is not operable as it is appear to be invading to the psoas muscle and also involving his lower spine.  He just had the interventional radiology procedure for his left leg and the thromboembolic disease.  We will keep him on Lovenox for right now.  I would like him on Lovenox for the duration of his chemotherapy and then switch him over to an oral agent.  Again, I am just happy that his quality of life is doing so well right now.  I think he comes back to see [place] for his cycle #2 of chemotherapy next week.  I am just happy for him and that he is able to do more and enjoy his life.  This clearly has been a "team effort."    Josph Macho, MD 1/6/20225:26 PM

## 2020-06-18 NOTE — Patient Instructions (Signed)

## 2020-06-19 ENCOUNTER — Telehealth: Payer: Self-pay | Admitting: Hematology & Oncology

## 2020-06-19 ENCOUNTER — Other Ambulatory Visit: Payer: Self-pay | Admitting: Hematology & Oncology

## 2020-06-19 LAB — BPAM RBC
Blood Product Expiration Date: 202201212359
Blood Product Expiration Date: 202201242359
ISSUE DATE / TIME: 202201060735
ISSUE DATE / TIME: 202201060735
Unit Type and Rh: 6200
Unit Type and Rh: 6200

## 2020-06-19 LAB — TYPE AND SCREEN
ABO/RH(D): A POS
Antibody Screen: NEGATIVE
Unit division: 0
Unit division: 0

## 2020-06-19 NOTE — Telephone Encounter (Signed)
No los 1/6 

## 2020-06-22 ENCOUNTER — Other Ambulatory Visit: Payer: Self-pay | Admitting: Interventional Radiology

## 2020-06-22 DIAGNOSIS — I87002 Postthrombotic syndrome without complications of left lower extremity: Secondary | ICD-10-CM

## 2020-06-22 MED FILL — ENOXAPARIN SODIUM 80 MG/0.8: 80 | 12 days supply | Qty: 19 | Fill #1

## 2020-06-23 ENCOUNTER — Other Ambulatory Visit (HOSPITAL_COMMUNITY): Payer: Self-pay | Admitting: Interventional Radiology

## 2020-06-24 ENCOUNTER — Other Ambulatory Visit: Payer: Self-pay

## 2020-06-24 ENCOUNTER — Inpatient Hospital Stay (HOSPITAL_BASED_OUTPATIENT_CLINIC_OR_DEPARTMENT_OTHER): Payer: 59 | Admitting: Hematology & Oncology

## 2020-06-24 ENCOUNTER — Encounter: Payer: Self-pay | Admitting: Hematology & Oncology

## 2020-06-24 ENCOUNTER — Inpatient Hospital Stay: Payer: 59

## 2020-06-24 ENCOUNTER — Telehealth: Payer: Self-pay

## 2020-06-24 ENCOUNTER — Encounter: Payer: Self-pay | Admitting: *Deleted

## 2020-06-24 VITALS — BP 117/69 | HR 79 | Resp 18

## 2020-06-24 VITALS — Temp 98.7°F | Wt 156.2 lb

## 2020-06-24 DIAGNOSIS — C799 Secondary malignant neoplasm of unspecified site: Secondary | ICD-10-CM | POA: Diagnosis not present

## 2020-06-24 DIAGNOSIS — C679 Malignant neoplasm of bladder, unspecified: Secondary | ICD-10-CM

## 2020-06-24 DIAGNOSIS — Z5111 Encounter for antineoplastic chemotherapy: Secondary | ICD-10-CM | POA: Diagnosis not present

## 2020-06-24 LAB — CBC WITH DIFFERENTIAL (CANCER CENTER ONLY)
Abs Immature Granulocytes: 0.06 10*3/uL (ref 0.00–0.07)
Basophils Absolute: 0.1 10*3/uL (ref 0.0–0.1)
Basophils Relative: 1 %
Eosinophils Absolute: 0 10*3/uL (ref 0.0–0.5)
Eosinophils Relative: 0 %
HCT: 28.4 % — ABNORMAL LOW (ref 39.0–52.0)
Hemoglobin: 9.3 g/dL — ABNORMAL LOW (ref 13.0–17.0)
Immature Granulocytes: 1 %
Lymphocytes Relative: 6 %
Lymphs Abs: 0.5 10*3/uL — ABNORMAL LOW (ref 0.7–4.0)
MCH: 33.3 pg (ref 26.0–34.0)
MCHC: 32.7 g/dL (ref 30.0–36.0)
MCV: 101.8 fL — ABNORMAL HIGH (ref 80.0–100.0)
Monocytes Absolute: 0.8 10*3/uL (ref 0.1–1.0)
Monocytes Relative: 9 %
Neutro Abs: 7 10*3/uL (ref 1.7–7.7)
Neutrophils Relative %: 83 %
Platelet Count: 265 10*3/uL (ref 150–400)
RBC: 2.79 MIL/uL — ABNORMAL LOW (ref 4.22–5.81)
RDW: 18.5 % — ABNORMAL HIGH (ref 11.5–15.5)
WBC Count: 8.5 10*3/uL (ref 4.0–10.5)
nRBC: 0 % (ref 0.0–0.2)

## 2020-06-24 LAB — MAGNESIUM: Magnesium: 1.9 mg/dL (ref 1.7–2.4)

## 2020-06-24 LAB — CMP (CANCER CENTER ONLY)
ALT: 10 U/L (ref 0–44)
AST: 10 U/L — ABNORMAL LOW (ref 15–41)
Albumin: 4.1 g/dL (ref 3.5–5.0)
Alkaline Phosphatase: 58 U/L (ref 38–126)
Anion gap: 7 (ref 5–15)
BUN: 23 mg/dL (ref 8–23)
CO2: 25 mmol/L (ref 22–32)
Calcium: 9.9 mg/dL (ref 8.9–10.3)
Chloride: 104 mmol/L (ref 98–111)
Creatinine: 1 mg/dL (ref 0.61–1.24)
GFR, Estimated: >60 mL/min (ref >60)
Glucose, Bld: 107 mg/dL — ABNORMAL HIGH (ref 70–99)
Potassium: 3.9 mmol/L (ref 3.5–5.1)
Sodium: 136 mmol/L (ref 135–145)
Total Bilirubin: 0.2 mg/dL — ABNORMAL LOW (ref 0.3–1.2)
Total Protein: 6.6 g/dL (ref 6.5–8.1)

## 2020-06-24 LAB — LACTATE DEHYDROGENASE: LDH: 148 U/L (ref 98–192)

## 2020-06-24 MED ORDER — POTASSIUM CHLORIDE IN NACL 20-0.9 MEQ/L-% IV SOLN
Freq: Once | INTRAVENOUS | Status: AC
  Filled 2020-06-24: qty 1000

## 2020-06-24 MED ORDER — SODIUM CHLORIDE 0.9 % IV SOLN
150.0000 mg | Freq: Once | INTRAVENOUS | Status: AC
  Administered 2020-06-24: 150 mg via INTRAVENOUS
  Filled 2020-06-24: qty 150

## 2020-06-24 MED ORDER — SODIUM CHLORIDE 0.9 % IV SOLN
35.0000 mg/m2 | Freq: Once | INTRAVENOUS | Status: AC
  Administered 2020-06-24: 68 mg via INTRAVENOUS
  Filled 2020-06-24: qty 68

## 2020-06-24 MED ORDER — SODIUM CHLORIDE 0.9 % IV SOLN
16.0000 mg | Freq: Once | INTRAVENOUS | Status: AC
  Administered 2020-06-24: 16 mg via INTRAVENOUS
  Filled 2020-06-24: qty 8

## 2020-06-24 MED ORDER — SODIUM CHLORIDE 0.9% FLUSH
10.0000 mL | INTRAVENOUS | Status: DC | PRN
  Administered 2020-06-24: 10 mL
  Filled 2020-06-24: qty 10

## 2020-06-24 MED ORDER — SODIUM CHLORIDE 0.9 % IV SOLN
30.0000 mg/m2 | Freq: Once | INTRAVENOUS | Status: AC
  Administered 2020-06-24: 58.25 mg via INTRAVENOUS
  Filled 2020-06-24: qty 2.33

## 2020-06-24 MED ORDER — SODIUM CHLORIDE 0.9 % IV SOLN
10.0000 mg | Freq: Once | INTRAVENOUS | Status: AC
  Administered 2020-06-24: 10 mg via INTRAVENOUS
  Filled 2020-06-24: qty 10

## 2020-06-24 MED ORDER — HEPARIN SOD (PORK) LOCK FLUSH 100 UNIT/ML IV SOLN
500.0000 [IU] | Freq: Once | INTRAVENOUS | Status: AC | PRN
  Administered 2020-06-24: 500 [IU]
  Filled 2020-06-24: qty 5

## 2020-06-24 MED ORDER — SODIUM CHLORIDE 0.9 % IV SOLN
INTRAVENOUS | Status: DC
  Filled 2020-06-24: qty 250

## 2020-06-24 MED ORDER — MAGNESIUM SULFATE 2 GM/50ML IV SOLN
2.0000 g | Freq: Once | INTRAVENOUS | Status: AC
  Administered 2020-06-24: 2 g via INTRAVENOUS
  Filled 2020-06-24: qty 50

## 2020-06-24 MED ORDER — SODIUM CHLORIDE 0.9 % IV SOLN
Freq: Once | INTRAVENOUS | Status: AC
  Filled 2020-06-24: qty 250

## 2020-06-24 NOTE — Patient Instructions (Signed)
[facility] Discharge Instructions for Patients Receiving Chemotherapy  Today you received the following chemotherapy agents Methotrexate, Cisplatin.  To help prevent nausea and vomiting after your treatment, we encourage you to take your nausea medication as indicated by your MD.   If you develop nausea and vomiting that is not controlled by your nausea medication, call the clinic.   BELOW ARE SYMPTOMS THAT SHOULD BE REPORTED IMMEDIATELY:  *FEVER GREATER THAN 100.5 F  *CHILLS WITH OR WITHOUT FEVER  NAUSEA AND VOMITING THAT IS NOT CONTROLLED WITH YOUR NAUSEA MEDICATION  *UNUSUAL SHORTNESS OF BREATH  *UNUSUAL BRUISING OR BLEEDING  TENDERNESS IN MOUTH AND THROAT WITH OR WITHOUT PRESENCE OF ULCERS  *URINARY PROBLEMS  *BOWEL PROBLEMS  UNUSUAL RASH Items with * indicate a potential emergency and should be followed up as soon as possible.  Feel free to call the clinic should you have any questions or concerns. The clinic phone number is [phone].  Please show the [facility] at check-in to the Emergency Department and triage nurse.

## 2020-06-24 NOTE — Addendum Note (Signed)
Addended by: Cordelia Poche on: 06/24/2020 12:08 PM   Modules accepted: Orders

## 2020-06-24 NOTE — Progress Notes (Signed)
Patient has had weight loss since last appointment. Will reach out to our dietician for follow up.   Oncology Nurse Navigator Documentation  Oncology Nurse Navigator Flowsheets 06/24/2020  Abnormal Finding Date -  Confirmed Diagnosis Date -  Diagnosis Status -  Planned Course of Treatment -  Phase of Treatment -  Chemotherapy Pending- Reason: -  Chemo/Radiation Concurrent Actual Start Date: -  Navigator Follow Up Date: 07/08/2020  Navigator Follow Up Reason: Follow-up Appointment;Chemotherapy  Navigator Location [facility]  Referral Date to RadOnc/MedOnc -  Navigator Encounter Type Treatment  Telephone -  Treatment Initiated Date -  Patient Visit Type MedOnc  Treatment Phase Active Tx  Barriers/Navigation Needs Coordination of Care;Education  Education -  Interventions Psycho-Social Support  Acuity Level 2-Minimal Needs (1-2 Barriers Identified)  Referrals Nutrition/dietician  Coordination of Care -  Education Method -  Support Groups/Services Friends and Family  Time Spent with Patient 30

## 2020-06-24 NOTE — Telephone Encounter (Signed)
appts made per 06/24/20 los and pt will receive sch in chemo/avs

## 2020-06-24 NOTE — Progress Notes (Signed)
Hematology and Oncology Follow Up Visit  NATHANAEL KRIST [id number] 06-22-1955 65 y.o. 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 Guice is back for a follow-up.  We did see him last week because of some anemia.  He has some bleeding after having a thrombectomy done.  He is on Lovenox.  He is doing quite well.  He is exercising more.  He is eating well.  His weight is still going down but I suspect his weight will will start coming back up.  He has had no problems with pain.  He has had no problems with bowels or bladder.  There is no cough or shortness of breath.  Overall, his performance status is ECOG 1.    Medications:  Current Outpatient Medications:  .  ASPIRIN ADULT LOW STRENGTH 81 MG EC tablet, TAKE 1 TABLET BY MOUTH EVERY DAY (Patient taking differently: Take 81 mg by mouth daily.), Disp: 30 tablet, Rfl: 11 .  Calcium Carbonate-Vit D-Min (CALTRATE PLUS PO), Take 1 tablet by mouth daily., Disp: , Rfl:  .  enoxaparin (LOVENOX) 80 MG/0.8ML injection, Inject 0.8 mLs (80 mg total) into the skin every 12 (twelve) hours., Disp: 19.2 mL, Rfl: 6 .  fentaNYL (DURAGESIC) 50 MCG/HR, PLACE 1 PATCH ONTO THE SKIN EVERY 3 (THREE) DAYS., Disp: 10 patch, Rfl: 0 .  lidocaine-prilocaine (EMLA) cream, Apply to affected area once (Patient taking differently: Apply 1 application topically once. Apply to affected area once), Disp: 30 g, Rfl: 3 .  LORazepam (ATIVAN) 0.5 MG tablet, Take 1 tablet (0.5 mg total) by mouth every 6 (six) hours as needed (Nausea or vomiting)., Disp: 30 tablet, Rfl: 0 .  Multiple Vitamins-Minerals (MULTIVITAMIN WITH  MINERALS) tablet, Take 1 tablet by mouth daily., Disp: , Rfl:  .  ondansetron (ZOFRAN ODT) 8 MG disintegrating tablet, Take 1 tablet (8 mg total) by mouth every 8 (eight) hours as needed for nausea or vomiting., Disp: 30 tablet, Rfl: 0 .  sodium chloride (OCEAN) 0.65 % SOLN nasal spray, Place 1 spray into both nostrils daily as needed for congestion (Allergies)., Disp: , Rfl:  .  gabapentin (NEURONTIN) 600 MG tablet, Take 1 tablet (600 mg total) by mouth in the morning, at noon, in the evening, and at bedtime. (Patient not taking: Reported on 06/24/2020), Disp: 120 tablet, Rfl: 3 .  meloxicam (MOBIC) 15 MG tablet, Take 1 tablet (15 mg total) by mouth daily. (Patient not taking: Reported on 06/24/2020), Disp: 30 tablet, Rfl: 3 .  Oxycodone HCl 10 MG TABS, TAKE 1 TABLET (10 MG TOTAL) BY MOUTH EVERY 4 (FOUR) HOURS AS NEEDED. (Patient not taking: Reported on 06/24/2020), Disp: 90 tablet, Rfl: 0 .  zolpidem (AMBIEN) 10 MG tablet, TAKE 1 TABLET (10 MG TOTAL) BY MOUTH AT BEDTIME AS NEEDED FOR SLEEP. (Patient not taking: Reported on 06/24/2020), Disp: 30 tablet, Rfl: 0  Allergies: No Known Allergies  Past Medical History, Surgical history, Social history, and Family History were reviewed and updated.  Review of Systems: Review of Systems  Constitutional: Negative.   HENT:  Negative.   Eyes: Negative.   Respiratory: Negative.   Cardiovascular: Positive  for leg swelling.  Gastrointestinal: Negative.   Endocrine: Negative.   Musculoskeletal: Positive for flank pain.  Skin: Negative.   Neurological: Negative.   Hematological: Negative.   Psychiatric/Behavioral: Negative.     Physical Exam:  weight is 156 lb 3.2 oz (70.9 kg). His oral temperature is 98.7 F (37.1 C).   Wt Readings from Last 3 Encounters:  06/24/20 156 lb 3.2 oz (70.9 kg)  06/17/20 165 lb (74.8 kg)  06/08/20 165 lb (74.8 kg)    Physical Exam Vitals reviewed.  HENT:     Head: Normocephalic and atraumatic.  Eyes:     Pupils:  Pupils are equal, round, and reactive to light.  Cardiovascular:     Rate and Rhythm: Normal rate and regular rhythm.     Heart sounds: Normal heart sounds.  Pulmonary:     Effort: Pulmonary effort is normal.     Breath sounds: Normal breath sounds.  Abdominal:     General: Bowel sounds are normal.     Palpations: Abdomen is soft.  Musculoskeletal:        General: No tenderness or deformity. Normal range of motion.     Cervical back: Normal range of motion.     Comments: Extremities shows minimal swelling of the left leg.  He has good range of motion of the leg.  He has good pulses in his distal extremities.  The right leg is unremarkable.  Lymphadenopathy:     Cervical: No cervical adenopathy.  Skin:    General: Skin is warm and dry.     Findings: No erythema or rash.  Neurological:     Mental Status: He is alert and oriented to person, place, and time.  Psychiatric:        Behavior: Behavior normal.        Thought Content: Thought content normal.        Judgment: Judgment normal.    Lab Results  Component Value Date   WBC 8.5 06/24/2020   HGB 9.3 (L) 06/24/2020   HCT 28.4 (L) 06/24/2020   MCV 101.8 (H) 06/24/2020   PLT 265 06/24/2020     Chemistry      Component Value Date/Time   NA 136 06/17/2020 0730   K 3.8 06/17/2020 0730   CL 103 06/17/2020 0730   CO2 23 06/17/2020 0730   BUN 17 06/17/2020 0730   CREATININE 1.10 06/17/2020 0730   CREATININE 0.91 06/02/2020 0815      Component Value Date/Time   CALCIUM 8.9 06/17/2020 0730   ALKPHOS 37 (L) 06/02/2020 0815   AST 13 (L) 06/02/2020 0815   ALT 13 06/02/2020 0815   BILITOT 0.3 06/02/2020 0815       Impression and Plan: Mr. Rosero is a really nice 65 year old white male.  He has a very interesting problem.  He had in situ bladder cancer about 5 years ago.  Now, he has a recurrence that I would consider metastatic.  This is outside of the bladder.  It is not operable as it is appear to be invading to the psoas  muscle and also involving his lower spine.  He completed initial radiation and chemotherapy.  He did very well with this.  He had a very nice response by PET scan and by MRI.  We have systemic therapy right now since he is at high risk for systemic disease.  Hopefully, with systemic therapy we will eradicate any micrometastatic disease.  I am planning on a total of 6 cycles of  dd-MVAC.  I think this would be very reasonable.  We will go ahead with the second cycle today.  He is still on Lovenox.  We will keep on Lovenox until he sees interventional radiology again.  Once he sees them, and we might be able to switch over to an oral agent.   Volanda Napoleon, MD 1/12/20228:57 AM

## 2020-06-25 ENCOUNTER — Other Ambulatory Visit (HOSPITAL_COMMUNITY): Payer: Self-pay | Admitting: Interventional Radiology

## 2020-06-25 ENCOUNTER — Inpatient Hospital Stay: Payer: 59

## 2020-06-25 ENCOUNTER — Encounter (HOSPITAL_COMMUNITY): Payer: Self-pay

## 2020-06-25 VITALS — BP 120/62 | HR 68 | Temp 98.3°F | Resp 20

## 2020-06-25 DIAGNOSIS — C679 Malignant neoplasm of bladder, unspecified: Secondary | ICD-10-CM

## 2020-06-25 DIAGNOSIS — I82419 Acute embolism and thrombosis of unspecified femoral vein: Secondary | ICD-10-CM

## 2020-06-25 DIAGNOSIS — C799 Secondary malignant neoplasm of unspecified site: Secondary | ICD-10-CM

## 2020-06-25 DIAGNOSIS — Z5111 Encounter for antineoplastic chemotherapy: Secondary | ICD-10-CM | POA: Diagnosis not present

## 2020-06-25 MED ORDER — SODIUM CHLORIDE 0.9 % IV SOLN
Freq: Once | INTRAVENOUS | Status: AC
  Filled 2020-06-25: qty 250

## 2020-06-25 MED ORDER — MAGNESIUM SULFATE 2 GM/50ML IV SOLN
2.0000 g | Freq: Once | INTRAVENOUS | Status: AC
  Administered 2020-06-25: 2 g via INTRAVENOUS
  Filled 2020-06-25: qty 50

## 2020-06-25 MED ORDER — PALONOSETRON HCL INJECTION 0.25 MG/5ML
INTRAVENOUS | Status: AC
  Filled 2020-06-25: qty 5

## 2020-06-25 MED ORDER — DOXORUBICIN HCL CHEMO IV INJECTION 2 MG/ML
30.0000 mg/m2 | Freq: Once | INTRAVENOUS | Status: AC
  Administered 2020-06-25: 58 mg via INTRAVENOUS
  Filled 2020-06-25: qty 29

## 2020-06-25 MED ORDER — SODIUM CHLORIDE 0.9% FLUSH
10.0000 mL | INTRAVENOUS | Status: DC | PRN
  Administered 2020-06-25: 10 mL
  Filled 2020-06-25: qty 10

## 2020-06-25 MED ORDER — SODIUM CHLORIDE 0.9 % IV SOLN
10.0000 mg | Freq: Once | INTRAVENOUS | Status: AC
  Administered 2020-06-25: 10 mg via INTRAVENOUS
  Filled 2020-06-25: qty 10

## 2020-06-25 MED ORDER — HEPARIN SOD (PORK) LOCK FLUSH 100 UNIT/ML IV SOLN
500.0000 [IU] | Freq: Once | INTRAVENOUS | Status: AC | PRN
  Administered 2020-06-25: 500 [IU]
  Filled 2020-06-25: qty 5

## 2020-06-25 MED ORDER — POTASSIUM CHLORIDE IN NACL 20-0.9 MEQ/L-% IV SOLN
Freq: Once | INTRAVENOUS | Status: AC
  Filled 2020-06-25: qty 1000

## 2020-06-25 MED ORDER — VINBLASTINE SULFATE CHEMO INJECTION 1 MG/ML
3.0000 mg/m2 | Freq: Once | INTRAVENOUS | Status: AC
  Administered 2020-06-25: 5.8 mg via INTRAVENOUS
  Filled 2020-06-25: qty 5.8

## 2020-06-25 MED ORDER — PALONOSETRON HCL INJECTION 0.25 MG/5ML
0.2500 mg | Freq: Once | INTRAVENOUS | Status: AC
  Administered 2020-06-25: 0.25 mg via INTRAVENOUS

## 2020-06-25 MED ORDER — SODIUM CHLORIDE 0.9 % IV SOLN
35.0000 mg/m2 | Freq: Once | INTRAVENOUS | Status: AC
  Administered 2020-06-25: 68 mg via INTRAVENOUS
  Filled 2020-06-25: qty 68

## 2020-06-25 NOTE — Patient Instructions (Signed)
[facility] Discharge Instructions for Patients Receiving Chemotherapy  Today you received the following chemotherapy agents:  Adriamycin, Velban and Cisplatin  To help prevent nausea and vomiting after your treatment, we encourage you to take your nausea medication as ordered per MD.    If you develop nausea and vomiting that is not controlled by your nausea medication, call the clinic.   BELOW ARE SYMPTOMS THAT SHOULD BE REPORTED IMMEDIATELY:  *FEVER GREATER THAN 100.5 F  *CHILLS WITH OR WITHOUT FEVER  NAUSEA AND VOMITING THAT IS NOT CONTROLLED WITH YOUR NAUSEA MEDICATION  *UNUSUAL SHORTNESS OF BREATH  *UNUSUAL BRUISING OR BLEEDING  TENDERNESS IN MOUTH AND THROAT WITH OR WITHOUT PRESENCE OF ULCERS  *URINARY PROBLEMS  *BOWEL PROBLEMS  UNUSUAL RASH Items with * indicate a potential emergency and should be followed up as soon as possible.  Feel free to call the clinic should you have any questions or concerns. The clinic phone number is [phone].  Please show the CHEMO ALERT CARD at check-in to the Emergency Department and triage nurse.   

## 2020-06-25 NOTE — Progress Notes (Signed)
OK to run post hydration fluids with Cisplatin per order of Dr. Ennever.  

## 2020-06-26 ENCOUNTER — Encounter: Payer: Self-pay | Admitting: *Deleted

## 2020-06-26 ENCOUNTER — Other Ambulatory Visit: Payer: Self-pay | Admitting: *Deleted

## 2020-06-26 ENCOUNTER — Inpatient Hospital Stay: Payer: 59

## 2020-06-26 ENCOUNTER — Other Ambulatory Visit: Payer: Self-pay

## 2020-06-26 ENCOUNTER — Other Ambulatory Visit (HOSPITAL_BASED_OUTPATIENT_CLINIC_OR_DEPARTMENT_OTHER): Payer: Self-pay | Admitting: Hematology & Oncology

## 2020-06-26 VITALS — BP 127/53 | HR 71 | Temp 98.3°F | Resp 18

## 2020-06-26 DIAGNOSIS — Z5111 Encounter for antineoplastic chemotherapy: Secondary | ICD-10-CM | POA: Diagnosis not present

## 2020-06-26 DIAGNOSIS — C799 Secondary malignant neoplasm of unspecified site: Secondary | ICD-10-CM

## 2020-06-26 DIAGNOSIS — C679 Malignant neoplasm of bladder, unspecified: Secondary | ICD-10-CM

## 2020-06-26 MED ORDER — LACTULOSE 20 GM/30ML PO SOLN: 30.0000 mL | ORAL | 2 refills | Status: DC | PRN

## 2020-06-26 MED ORDER — PEGFILGRASTIM INJECTION 6 MG/0.6ML ~~LOC~~
6.0000 mg | PREFILLED_SYRINGE | Freq: Once | SUBCUTANEOUS | Status: AC
  Administered 2020-06-26: 6 mg via SUBCUTANEOUS
  Filled 2020-06-26: qty 0.6

## 2020-06-26 MED FILL — GENERLAC SOLUTION 10G/15ML: 10 | 3 days supply | Qty: 450 | Fill #0

## 2020-06-26 NOTE — Progress Notes (Signed)
Patient calling with 2 concerns.   After each chemo cycle he experience BLE swelling, however this is worse in his LEFT leg which is the leg which is stented. He states this swelling lasts for about 1 week and causes pain in his L leg. He would prefer for this not to happen.  Explained to the patient that he gets a chemo therapy regimen which requires hydration and a large volume of IVF. Also explained that due to the chemo effects, we cannot diurese him as this would place him at risk for kidney injury. He asked if changing treatment to radiation would be possible. I explained to him that it is not.   Patient complains of significant constipation without relief from Miralax. Prescription for Lactulose sent.   Dr Marin Olp notified of patient concern regarding his BLE swelling. We will have the RN assess him when he comes in today for his injection.   Oncology Nurse Navigator Documentation  Oncology Nurse Navigator Flowsheets 06/26/2020  Abnormal Finding Date -  Confirmed Diagnosis Date -  Diagnosis Status -  Planned Course of Treatment -  Phase of Treatment -  Chemotherapy Pending- Reason: -  Chemo/Radiation Concurrent Actual Start Date: -  Navigator Follow Up Date: 07/08/2020  Navigator Follow Up Reason: Follow-up Appointment;Chemotherapy  Navigator Location [facility]  Referral Date to RadOnc/MedOnc -  Navigator Encounter Type Telephone  Telephone Symptom Mgt;Incoming Call  Treatment Initiated Date -  Patient Visit Type MedOnc  Treatment Phase Active Tx  Barriers/Navigation Needs Coordination of Care;Education  Education Pain/ Symptom Management  Interventions Education;Psycho-Social Support  Acuity Level 2-Minimal Needs (1-2 Barriers Identified)  Referrals -  Coordination of Care -  Education Method Verbal  Support Groups/Services Friends and Family  Time Spent with Patient 33

## 2020-06-26 NOTE — Patient Instructions (Signed)
Pegfilgrastim injection What is this medicine? PEGFILGRASTIM (PEG fil gra stim) is a long-acting granulocyte colony-stimulating factor that stimulates the growth of neutrophils, a type of white blood cell important in the body's fight against infection. It is used to reduce the incidence of fever and infection in patients with certain types of cancer who are receiving chemotherapy that affects the bone marrow, and to increase survival after being exposed to high doses of radiation. This medicine may be used for other purposes; ask your health care provider or pharmacist if you have questions. COMMON BRAND NAME(S): Fulphila, Neulasta, Nyvepria, UDENYCA, Ziextenzo What should I tell my health care provider before I take this medicine? They need to know if you have any of these conditions:  kidney disease  latex allergy  ongoing radiation therapy  sickle cell disease  skin reactions to acrylic adhesives (On-Body Injector only)  an unusual or allergic reaction to pegfilgrastim, filgrastim, other medicines, foods, dyes, or preservatives  pregnant or trying to get pregnant  breast-feeding How should I use this medicine? This medicine is for injection under the skin. If you get this medicine at home, you will be taught how to prepare and give the pre-filled syringe or how to use the On-body Injector. Refer to the patient Instructions for Use for detailed instructions. Use exactly as directed. Tell your healthcare provider immediately if you suspect that the On-body Injector may not have performed as intended or if you suspect the use of the On-body Injector resulted in a missed or partial dose. It is important that you put your used needles and syringes in a special sharps container. Do not put them in a trash can. If you do not have a sharps container, call your pharmacist or healthcare provider to get one. Talk to your pediatrician regarding the use of this medicine in children. While this drug  may be prescribed for selected conditions, precautions do apply. Overdosage: If you think you have taken too much of this medicine contact a poison control center or emergency room at once. NOTE: This medicine is only for you. Do not share this medicine with others. What if I miss a dose? It is important not to miss your dose. Call your doctor or health care professional if you miss your dose. If you miss a dose due to an On-body Injector failure or leakage, a new dose should be administered as soon as possible using a single prefilled syringe for manual use. What may interact with this medicine? Interactions have not been studied. This list may not describe all possible interactions. Give your health care provider a list of all the medicines, herbs, non-prescription drugs, or dietary supplements you use. Also tell them if you smoke, drink alcohol, or use illegal drugs. Some items may interact with your medicine. What should I watch for while using this medicine? Your condition will be monitored carefully while you are receiving this medicine. You may need blood work done while you are taking this medicine. Talk to your health care provider about your risk of cancer. You may be more at risk for certain types of cancer if you take this medicine. If you are going to need a MRI, CT scan, or other procedure, tell your doctor that you are using this medicine (On-Body Injector only). What side effects may I notice from receiving this medicine? Side effects that you should report to your doctor or health care professional as soon as possible:  allergic reactions (skin rash, itching or hives, swelling of   the face, lips, or tongue)  back pain  dizziness  fever  pain, redness, or irritation at site where injected  pinpoint red spots on the skin  red or dark-brown urine  shortness of breath or breathing problems  stomach or side pain, or pain at the shoulder  swelling  tiredness  trouble  passing urine or change in the amount of urine  unusual bruising or bleeding Side effects that usually do not require medical attention (report to your doctor or health care professional if they continue or are bothersome):  bone pain  muscle pain This list may not describe all possible side effects. Call your doctor for medical advice about side effects. You may report side effects to FDA at 1-800-FDA-1088. Where should I keep my medicine? Keep out of the reach of children. If you are using this medicine at home, you will be instructed on how to store it. Throw away any unused medicine after the expiration date on the label. NOTE: This sheet is a summary. It may not cover all possible information. If you have questions about this medicine, talk to your doctor, pharmacist, or health care provider.  2021 Elsevier/Gold Standard (2019-06-21 13:20:51)  

## 2020-06-29 ENCOUNTER — Other Ambulatory Visit: Payer: Self-pay | Admitting: Hematology & Oncology

## 2020-06-29 DIAGNOSIS — C799 Secondary malignant neoplasm of unspecified site: Secondary | ICD-10-CM

## 2020-06-29 MED FILL — PANTOPRAZOLE SOD DR 40 MG T: 40 | 30 days supply | Qty: 30 | Fill #1

## 2020-07-01 ENCOUNTER — Other Ambulatory Visit: Payer: Self-pay | Admitting: Hematology & Oncology

## 2020-07-01 MED FILL — LORazepam 0.5 MG TABS: 0.5 | 23 days supply | Qty: 90 | Fill #0

## 2020-07-01 MED FILL — fentaNYL 50 MCG/HR PT72: 50 | 30 days supply | Qty: 10 | Fill #0

## 2020-07-02 MED FILL — ENOXAPARIN SODIUM 80 MG/0.8: 80 | 12 days supply | Qty: 19 | Fill #2

## 2020-07-08 ENCOUNTER — Inpatient Hospital Stay: Payer: 59

## 2020-07-08 ENCOUNTER — Telehealth: Payer: Self-pay

## 2020-07-08 ENCOUNTER — Other Ambulatory Visit: Payer: Self-pay

## 2020-07-08 ENCOUNTER — Telehealth: Payer: Self-pay | Admitting: Nutrition

## 2020-07-08 ENCOUNTER — Encounter: Payer: Self-pay | Admitting: *Deleted

## 2020-07-08 ENCOUNTER — Inpatient Hospital Stay (HOSPITAL_BASED_OUTPATIENT_CLINIC_OR_DEPARTMENT_OTHER): Payer: 59 | Admitting: Hematology & Oncology

## 2020-07-08 ENCOUNTER — Encounter: Payer: Self-pay | Admitting: Hematology & Oncology

## 2020-07-08 ENCOUNTER — Inpatient Hospital Stay: Payer: 59 | Admitting: Nutrition

## 2020-07-08 VITALS — Wt 160.1 lb

## 2020-07-08 DIAGNOSIS — C799 Secondary malignant neoplasm of unspecified site: Secondary | ICD-10-CM

## 2020-07-08 DIAGNOSIS — Z5111 Encounter for antineoplastic chemotherapy: Secondary | ICD-10-CM | POA: Diagnosis not present

## 2020-07-08 DIAGNOSIS — C679 Malignant neoplasm of bladder, unspecified: Secondary | ICD-10-CM | POA: Diagnosis not present

## 2020-07-08 LAB — CBC WITH DIFFERENTIAL (CANCER CENTER ONLY)
Abs Immature Granulocytes: 0.29 10*3/uL — ABNORMAL HIGH (ref 0.00–0.07)
Basophils Absolute: 0.1 10*3/uL (ref 0.0–0.1)
Basophils Relative: 1 %
Eosinophils Absolute: 0.1 10*3/uL (ref 0.0–0.5)
Eosinophils Relative: 1 %
HCT: 28.5 % — ABNORMAL LOW (ref 39.0–52.0)
Hemoglobin: 9.5 g/dL — ABNORMAL LOW (ref 13.0–17.0)
Immature Granulocytes: 2 %
Lymphocytes Relative: 6 %
Lymphs Abs: 0.7 10*3/uL (ref 0.7–4.0)
MCH: 33.8 pg (ref 26.0–34.0)
MCHC: 33.3 g/dL (ref 30.0–36.0)
MCV: 101.4 fL — ABNORMAL HIGH (ref 80.0–100.0)
Monocytes Absolute: 0.8 10*3/uL (ref 0.1–1.0)
Monocytes Relative: 6 %
Neutro Abs: 11.3 10*3/uL — ABNORMAL HIGH (ref 1.7–7.7)
Neutrophils Relative %: 84 %
Platelet Count: 188 10*3/uL (ref 150–400)
RBC: 2.81 MIL/uL — ABNORMAL LOW (ref 4.22–5.81)
RDW: 17.1 % — ABNORMAL HIGH (ref 11.5–15.5)
WBC Count: 13.2 10*3/uL — ABNORMAL HIGH (ref 4.0–10.5)
nRBC: 0 % (ref 0.0–0.2)

## 2020-07-08 LAB — CMP (CANCER CENTER ONLY)
ALT: 10 U/L (ref 0–44)
AST: 10 U/L — ABNORMAL LOW (ref 15–41)
Albumin: 3.9 g/dL (ref 3.5–5.0)
Alkaline Phosphatase: 70 U/L (ref 38–126)
Anion gap: 8 (ref 5–15)
BUN: 17 mg/dL (ref 8–23)
CO2: 26 mmol/L (ref 22–32)
Calcium: 9.7 mg/dL (ref 8.9–10.3)
Chloride: 103 mmol/L (ref 98–111)
Creatinine: 1.07 mg/dL (ref 0.61–1.24)
GFR, Estimated: >60 mL/min (ref >60)
Glucose, Bld: 109 mg/dL — ABNORMAL HIGH (ref 70–99)
Potassium: 4.3 mmol/L (ref 3.5–5.1)
Sodium: 137 mmol/L (ref 135–145)
Total Bilirubin: 0.2 mg/dL — ABNORMAL LOW (ref 0.3–1.2)
Total Protein: 6.8 g/dL (ref 6.5–8.1)

## 2020-07-08 LAB — LACTATE DEHYDROGENASE: LDH: 146 U/L (ref 98–192)

## 2020-07-08 MED ORDER — SODIUM CHLORIDE 0.9% FLUSH
10.0000 mL | INTRAVENOUS | Status: DC | PRN
  Administered 2020-07-08: 10 mL
  Filled 2020-07-08: qty 10

## 2020-07-08 MED ORDER — SODIUM CHLORIDE 0.9 % IV SOLN
Freq: Once | INTRAVENOUS | Status: AC
  Filled 2020-07-08: qty 250

## 2020-07-08 MED ORDER — HEPARIN SOD (PORK) LOCK FLUSH 100 UNIT/ML IV SOLN
500.0000 [IU] | Freq: Once | INTRAVENOUS | Status: AC | PRN
  Administered 2020-07-08: 500 [IU]
  Filled 2020-07-08: qty 5

## 2020-07-08 MED ORDER — SODIUM CHLORIDE 0.9 % IV SOLN
16.0000 mg | Freq: Once | INTRAVENOUS | Status: AC
  Administered 2020-07-08: 16 mg via INTRAVENOUS
  Filled 2020-07-08: qty 8

## 2020-07-08 MED ORDER — POTASSIUM CHLORIDE IN NACL 20-0.9 MEQ/L-% IV SOLN
Freq: Once | INTRAVENOUS | Status: AC
  Filled 2020-07-08: qty 1000

## 2020-07-08 MED ORDER — SODIUM CHLORIDE 0.9 % IV SOLN
31.5000 mg/m2 | Freq: Once | INTRAVENOUS | Status: AC
  Administered 2020-07-08: 61 mg via INTRAVENOUS
  Filled 2020-07-08: qty 61

## 2020-07-08 MED ORDER — SODIUM CHLORIDE 0.9 % IV SOLN
30.0000 mg/m2 | Freq: Once | INTRAVENOUS | Status: AC
  Administered 2020-07-08: 58.25 mg via INTRAVENOUS
  Filled 2020-07-08: qty 2.33

## 2020-07-08 MED ORDER — MAGNESIUM SULFATE 2 GM/50ML IV SOLN
2.0000 g | Freq: Once | INTRAVENOUS | Status: AC
Start: 2020-07-08 — End: 2020-07-08
  Administered 2020-07-08: 2 g via INTRAVENOUS
  Filled 2020-07-08: qty 50

## 2020-07-08 MED ORDER — SODIUM CHLORIDE 0.9 % IV SOLN
10.0000 mg | Freq: Once | INTRAVENOUS | Status: AC
  Administered 2020-07-08: 10 mg via INTRAVENOUS
  Filled 2020-07-08: qty 10

## 2020-07-08 MED ORDER — SODIUM CHLORIDE 0.9 % IV SOLN
150.0000 mg | Freq: Once | INTRAVENOUS | Status: AC
  Administered 2020-07-08: 150 mg via INTRAVENOUS
  Filled 2020-07-08: qty 150

## 2020-07-08 MED ORDER — POTASSIUM CHLORIDE IN NACL 20-0.9 MEQ/L-% IV SOLN: Freq: Once | INTRAVENOUS | Status: DC

## 2020-07-08 NOTE — Progress Notes (Signed)
Hematology and Oncology Follow Up Visit  LEGEN EICHEL [id number] [birth year] 65 y.o. 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  He is starting to feel some effects of the chemotherapy.  I realize this is quite intensive chemotherapy.  We'll make a little bit of a dosage adjustment to see if this may not help him a little bit.  He has had no vomiting.  He has had little of nausea.  One of his big problems is constipation.  We gave him some lactulose but has not yet taken this.  Hopefully he will start taking this.  He continues on the Lovenox for his thromboembolic disease.  We'll keep on Lovenox until we do our next set of scans and see how everything looks.  He is exercising.  He is walking more.  He gets more active.  Para he says that he does have swelling in the left leg after each treatment.  I'm sure this is from all the fluid that he gets with treatment.  He says that in a few days, the swelling goes down.  He has no bleeding.  There is no cough or shortness of breath.  Overall, his performance status is ECOG 1.    Medications:  Current Outpatient Medications:  .  ASPIRIN ADULT LOW STRENGTH 81 MG EC tablet, TAKE 1 TABLET BY MOUTH EVERY DAY (Patient taking differently: Take 81 mg by mouth daily.), Disp: 30 tablet, Rfl: 11 .  Calcium Carbonate-Vit D-Min (CALTRATE PLUS PO), Take 1 tablet by mouth daily., Disp: , Rfl:  .  enoxaparin (LOVENOX) 80 MG/0.8ML injection, Inject 0.8 mLs (80 mg total) into the skin every 12 (twelve) hours., Disp: 19.2 mL, Rfl: 6 .  fentaNYL (DURAGESIC) 50 MCG/HR,  PLACE 1 PATCH ONTO THE SKIN EVERY 3 (THREE) DAYS., Disp: 10 patch, Rfl: 0 .  lidocaine-prilocaine (EMLA) cream, Apply to affected area once (Patient taking differently: Apply 1 application topically once. Apply to affected area once), Disp: 30 g, Rfl: 3 .  LORazepam (ATIVAN) 0.5 MG tablet, TAKE 1 TABLET (0.5 MG TOTAL) BY MOUTH EVERY 6 (SIX) HOURS AS NEEDED FOR ANXIETY., Disp: 90 tablet, Rfl: 1 .  Multiple Vitamins-Minerals (MULTIVITAMIN WITH MINERALS) tablet, Take 1 tablet by mouth daily., Disp: , Rfl:  .  ondansetron (ZOFRAN ODT) 8 MG disintegrating tablet, Take 1 tablet (8 mg total) by mouth every 8 (eight) hours as needed for nausea or vomiting., Disp: 30 tablet, Rfl: 0 .  Oxycodone HCl 10 MG TABS, TAKE 1 TABLET (10 MG TOTAL) BY MOUTH EVERY 4 (FOUR) HOURS AS NEEDED., Disp: 90 tablet, Rfl: 0 .  sodium chloride (OCEAN) 0.65 % SOLN nasal spray, Place 1 spray into both nostrils daily as needed for congestion (Allergies)., Disp: , Rfl:  .  gabapentin (NEURONTIN) 600 MG tablet, Take 1 tablet (600 mg total) by mouth in the morning, at noon, in the evening, and at bedtime. (Patient not taking: No sig reported), Disp: 120 tablet, Rfl: 3 .  Lactulose 20 GM/30ML SOLN, Take 30 mLs (20 g total) by mouth every 4 (four) hours as needed. (Patient not  taking: Reported on 07/08/2020), Disp: 450 mL, Rfl: 2 .  meloxicam (MOBIC) 15 MG tablet, Take 1 tablet (15 mg total) by mouth daily. (Patient not taking: No sig reported), Disp: 30 tablet, Rfl: 3 .  pantoprazole (PROTONIX) 40 MG tablet, Take 40 mg by mouth daily. (Patient not taking: Reported on 07/08/2020), Disp: , Rfl:  .  zolpidem (AMBIEN) 10 MG tablet, TAKE 1 TABLET (10 MG TOTAL) BY MOUTH AT BEDTIME AS NEEDED FOR SLEEP. (Patient not taking: Reported on 06/24/2020), Disp: 30 tablet, Rfl: 0  Allergies: No Known Allergies  Past Medical History, Surgical history, Social history, and Family History were reviewed and updated.  Review of Systems: Review of Systems   Constitutional: Negative.   HENT:  Negative.   Eyes: Negative.   Respiratory: Negative.   Cardiovascular: Positive for leg swelling.  Gastrointestinal: Negative.   Endocrine: Negative.   Musculoskeletal: Positive for flank pain.  Skin: Negative.   Neurological: Negative.   Hematological: Negative.   Psychiatric/Behavioral: Negative.     Physical Exam:  weight is 160 lb 1.3 oz (72.6 kg).   Wt Readings from Last 3 Encounters:  07/08/20 160 lb 1.3 oz (72.6 kg)  06/24/20 156 lb 3.2 oz (70.9 kg)  06/17/20 165 lb (74.8 kg)    Physical Exam Vitals reviewed.  HENT:     Head: Normocephalic and atraumatic.  Eyes:     Pupils: Pupils are equal, round, and reactive to light.  Cardiovascular:     Rate and Rhythm: Normal rate and regular rhythm.     Heart sounds: Normal heart sounds.  Pulmonary:     Effort: Pulmonary effort is normal.     Breath sounds: Normal breath sounds.  Abdominal:     General: Bowel sounds are normal.     Palpations: Abdomen is soft.  Musculoskeletal:        General: No tenderness or deformity. Normal range of motion.     Cervical back: Normal range of motion.     Comments: Extremities shows minimal swelling of the left leg.  He has good range of motion of the leg.  He has good pulses in his distal extremities.  The right leg is unremarkable.  Lymphadenopathy:     Cervical: No cervical adenopathy.  Skin:    General: Skin is warm and dry.     Findings: No erythema or rash.  Neurological:     Mental Status: He is alert and oriented to person, place, and time.  Psychiatric:        Behavior: Behavior normal.        Thought Content: Thought content normal.        Judgment: Judgment normal.    Lab Results  Component Value Date   WBC 13.2 (H) 07/08/2020   HGB 9.5 (L) 07/08/2020   HCT 28.5 (L) 07/08/2020   MCV 101.4 (H) 07/08/2020   PLT 188 07/08/2020     Chemistry      Component Value Date/Time   NA 136 06/24/2020 0830   K 3.9 06/24/2020 0830    CL 104 06/24/2020 0830   CO2 25 06/24/2020 0830   BUN 23 06/24/2020 0830   CREATININE 1.00 06/24/2020 0830      Component Value Date/Time   CALCIUM 9.9 06/24/2020 0830   ALKPHOS 58 06/24/2020 0830   AST 10 (L) 06/24/2020 0830   ALT 10 06/24/2020 0830   BILITOT 0.2 (L) 06/24/2020 0830       Impression and Plan: Brian Benton is a really  nice 65 year old white male.  He has a very interesting problem.  He had in situ bladder cancer about 5 years ago.  Now, he has a recurrence that I would consider metastatic.  This is outside of the bladder.  It is not operable as it is appear to be invading to the psoas muscle and also involving his lower spine.  He completed initial radiation and chemotherapy.  He did very well with this.  He had a very nice response by PET scan and by MRI.  We have systemic therapy right now since he is at high risk for systemic disease.  Hopefully, with systemic therapy we will eradicate any micrometastatic disease.  I am planning on a total of 6 cycles of dd-MVAC.  I think this would be very reasonable.  We will go ahead with the 3rd cycle today.  After this cycle, I will repeat his scans so we can see how everything looks.  If the scans looks like he is responding nicely, then we will see about switching him from Lovenox to one of the oral anticoagulants.   Brian Napoleon, MD 1/26/20228:27 AM

## 2020-07-08 NOTE — Telephone Encounter (Signed)
Telephone consult completed with patient. We had a poor connection.  65 year old male diagnosed with Metastatic Bladder cancer and is followed by Dr. Marin Olp receiving chemo and radiation.  PMH includes HLD.  Medications include lactulose, MVI, Zofran.  Labs were reviewed.  Height: 6 feet 0 inches. Weight: 160 pounds. Usual body weight: 176 pounds in October 2021. BMI: 21.18. ECOG: 1  Patient denies nutrition impact symptoms. Reports he is eating well and is pleased with weight gain.   Weight has improved by 4 pounds over the last couple weeks. Reports he eats 3 meals daily and is exercising.   He has no questions or concerns at this time.  Nutrition diagnosis:  Unintended weight loss related to inadequate oral intake as evidenced by 9% weight loss over 3 months. (Significant)  Intervention: Educated continuing small frequent meals with adequate calories and protein to promote weight maintenance/weight stabilization. Will mail fact sheets to patient at home address. Will include contact information for further questions.  Monitoring, evaluation, goals: Patient will tolerate adequate calories and protein for minimal weight loss.  Follow-up as needed.  **Disclaimer: This note was dictated with voice recognition software. Similar sounding words can inadvertently be transcribed and this note may contain transcription errors which may not have been corrected upon publication of note.**

## 2020-07-08 NOTE — Patient Instructions (Signed)

## 2020-07-08 NOTE — Progress Notes (Signed)
Oncology Nurse Navigator Documentation  Oncology Nurse Navigator Flowsheets 07/08/2020  Abnormal Finding Date -  Confirmed Diagnosis Date -  Diagnosis Status -  Planned Course of Treatment -  Phase of Treatment -  Chemotherapy Pending- Reason: -  Chemo/Radiation Concurrent Actual Start Date: -  Navigator Follow Up Date: 07/29/2020  Navigator Follow Up Reason: Follow-up Appointment;Chemotherapy  Navigator Location [facility]  Referral Date to RadOnc/MedOnc -  Navigator Encounter Type Treatment  Telephone -  Treatment Initiated Date -  Patient Visit Type MedOnc  Treatment Phase Active Tx  Barriers/Navigation Needs Coordination of Care;Education  Education -  Interventions Psycho-Social Support  Acuity Level 2-Minimal Needs (1-2 Barriers Identified)  Referrals -  Coordination of Care -  Education Method Verbal  Support Groups/Services Friends and Family  Time Spent with Patient 30

## 2020-07-08 NOTE — Progress Notes (Signed)
See telephone note.

## 2020-07-08 NOTE — Patient Instructions (Signed)
[facility] Discharge Instructions for Patients Receiving Chemotherapy  Today you received the following chemotherapy agents: Methotrexate and Cisplatin **NO ZOFRAN FOR 3 DAYS FOLLOWING CHEMOTHERAPY TREATMENT**  To help prevent nausea and vomiting after your treatment, we encourage you to take your nausea medicationas directed   If you develop nausea and vomiting that is not controlled by your nausea medication, call the clinic.   BELOW ARE SYMPTOMS THAT SHOULD BE REPORTED IMMEDIATELY:  *FEVER GREATER THAN 100.5 F  *CHILLS WITH OR WITHOUT FEVER  NAUSEA AND VOMITING THAT IS NOT CONTROLLED WITH YOUR NAUSEA MEDICATION  *UNUSUAL SHORTNESS OF BREATH  *UNUSUAL BRUISING OR BLEEDING  TENDERNESS IN MOUTH AND THROAT WITH OR WITHOUT PRESENCE OF ULCERS  *URINARY PROBLEMS  *BOWEL PROBLEMS  UNUSUAL RASH Items with * indicate a potential emergency and should be followed up as soon as possible.  Feel free to call the clinic should you have any questions or concerns. The clinic phone number is [phone].  Please show the [facility] at check-in to the Emergency Department and triage nurse.

## 2020-07-08 NOTE — Telephone Encounter (Signed)
appts made per 07/08/20 los and pt will rec appts in tx/avs-my chart    Brian Benton

## 2020-07-09 ENCOUNTER — Inpatient Hospital Stay: Payer: 59

## 2020-07-09 VITALS — BP 119/58 | HR 75 | Temp 98.4°F | Resp 17

## 2020-07-09 DIAGNOSIS — C799 Secondary malignant neoplasm of unspecified site: Secondary | ICD-10-CM

## 2020-07-09 DIAGNOSIS — C679 Malignant neoplasm of bladder, unspecified: Secondary | ICD-10-CM

## 2020-07-09 DIAGNOSIS — Z5111 Encounter for antineoplastic chemotherapy: Secondary | ICD-10-CM | POA: Diagnosis not present

## 2020-07-09 MED ORDER — PALONOSETRON HCL INJECTION 0.25 MG/5ML
0.2500 mg | Freq: Once | INTRAVENOUS | Status: AC
  Administered 2020-07-09: 0.25 mg via INTRAVENOUS

## 2020-07-09 MED ORDER — VINBLASTINE SULFATE CHEMO INJECTION 1 MG/ML
3.0000 mg/m2 | Freq: Once | INTRAVENOUS | Status: AC
  Administered 2020-07-09: 5.8 mg via INTRAVENOUS
  Filled 2020-07-09: qty 5.8

## 2020-07-09 MED ORDER — SODIUM CHLORIDE 0.9 % IV SOLN
10.0000 mg | Freq: Once | INTRAVENOUS | Status: AC
  Administered 2020-07-09: 10 mg via INTRAVENOUS
  Filled 2020-07-09: qty 10

## 2020-07-09 MED ORDER — SODIUM CHLORIDE 0.9 % IV SOLN
31.5000 mg/m2 | Freq: Once | INTRAVENOUS | Status: AC
  Administered 2020-07-09: 61 mg via INTRAVENOUS
  Filled 2020-07-09: qty 61

## 2020-07-09 MED ORDER — HEPARIN SOD (PORK) LOCK FLUSH 100 UNIT/ML IV SOLN
500.0000 [IU] | Freq: Once | INTRAVENOUS | Status: AC | PRN
  Administered 2020-07-09: 500 [IU]
  Filled 2020-07-09: qty 5

## 2020-07-09 MED ORDER — MAGNESIUM SULFATE 2 GM/50ML IV SOLN
2.0000 g | Freq: Once | INTRAVENOUS | Status: AC
  Administered 2020-07-09: 2 g via INTRAVENOUS
  Filled 2020-07-09: qty 50

## 2020-07-09 MED ORDER — POTASSIUM CHLORIDE IN NACL 20-0.9 MEQ/L-% IV SOLN
Freq: Once | INTRAVENOUS | Status: AC
  Filled 2020-07-09: qty 1000

## 2020-07-09 MED ORDER — SODIUM CHLORIDE 0.9 % IV SOLN
Freq: Once | INTRAVENOUS | Status: AC
  Filled 2020-07-09: qty 250

## 2020-07-09 MED ORDER — PALONOSETRON HCL INJECTION 0.25 MG/5ML
INTRAVENOUS | Status: AC
  Filled 2020-07-09: qty 5

## 2020-07-09 MED ORDER — DOXORUBICIN HCL CHEMO IV INJECTION 2 MG/ML
27.0000 mg/m2 | Freq: Once | INTRAVENOUS | Status: AC
  Administered 2020-07-09: 52 mg via INTRAVENOUS
  Filled 2020-07-09: qty 26

## 2020-07-09 MED ORDER — SODIUM CHLORIDE 0.9% FLUSH
10.0000 mL | INTRAVENOUS | Status: DC | PRN
  Administered 2020-07-09: 10 mL
  Filled 2020-07-09: qty 10

## 2020-07-09 NOTE — Patient Instructions (Signed)
Cisplatin injection What is this medicine? CISPLATIN (SIS pla tin) is a chemotherapy drug. It targets fast dividing cells, like cancer cells, and causes these cells to die. This medicine is used to treat many types of cancer like bladder, ovarian, and testicular cancers. This medicine may be used for other purposes; ask your health care provider or pharmacist if you have questions. COMMON BRAND NAME(S): Platinol, Platinol -AQ What should I tell my health care provider before I take this medicine? They need to know if you have any of these conditions:  eye disease, vision problems  hearing problems  kidney disease  low blood counts, like white cells, platelets, or red blood cells  tingling of the fingers or toes, or other nerve disorder  an unusual or allergic reaction to cisplatin, carboplatin, oxaliplatin, other medicines, foods, dyes, or preservatives  pregnant or trying to get pregnant  breast-feeding How should I use this medicine? This drug is given as an infusion into a vein. It is administered in a hospital or clinic by a specially trained health care professional. Talk to your pediatrician regarding the use of this medicine in children. Special care may be needed. Overdosage: If you think you have taken too much of this medicine contact a poison control center or emergency room at once. NOTE: This medicine is only for you. Do not share this medicine with others. What if I miss a dose? It is important not to miss a dose. Call your doctor or health care professional if you are unable to keep an appointment. What may interact with this medicine? This medicine may interact with the following medications:  foscarnet  certain antibiotics like amikacin, gentamicin, neomycin, polymyxin B, streptomycin, tobramycin, vancomycin This list may not describe all possible interactions. Give your health care provider a list of all the medicines, herbs, non-prescription drugs, or dietary  supplements you use. Also tell them if you smoke, drink alcohol, or use illegal drugs. Some items may interact with your medicine. What should I watch for while using this medicine? Your condition will be monitored carefully while you are receiving this medicine. You will need important blood work done while you are taking this medicine. This drug may make you feel generally unwell. This is not uncommon, as chemotherapy can affect healthy cells as well as cancer cells. Report any side effects. Continue your course of treatment even though you feel ill unless your doctor tells you to stop. This medicine may increase your risk of getting an infection. Call your healthcare professional for advice if you get a fever, chills, or sore throat, or other symptoms of a cold or flu. Do not treat yourself. Try to avoid being around people who are sick. Avoid taking medicines that contain aspirin, acetaminophen, ibuprofen, naproxen, or ketoprofen unless instructed by your healthcare professional. These medicines may hide a fever. This medicine may increase your risk to bruise or bleed. Call your doctor or health care professional if you notice any unusual bleeding. Be careful brushing and flossing your teeth or using a toothpick because you may get an infection or bleed more easily. If you have any dental work done, tell your dentist you are receiving this medicine. Do not become pregnant while taking this medicine or for 14 months after stopping it. Women should inform their healthcare professional if they wish to become pregnant or think they might be pregnant. Men should not father a child while taking this medicine and for 11 months after stopping it. There is potential for   serious side effects to an unborn child. Talk to your healthcare professional for more information. Do not breast-feed an infant while taking this medicine. This medicine has caused ovarian failure in some women. This medicine may make it more  difficult to get pregnant. Talk to your healthcare professional if you are concerned about your fertility. This medicine has caused decreased sperm counts in some men. This may make it more difficult to father a child. Talk to your healthcare professional if you are concerned about your fertility. Drink fluids as directed while you are taking this medicine. This will help protect your kidneys. Call your doctor or health care professional if you get diarrhea. Do not treat yourself. What side effects may I notice from receiving this medicine? Side effects that you should report to your doctor or health care professional as soon as possible:  allergic reactions like skin rash, itching or hives, swelling of the face, lips, or tongue  blurred vision  changes in vision  decreased hearing or ringing of the ears  nausea, vomiting  pain, redness, or irritation at site where injected  pain, tingling, numbness in the hands or feet  signs and symptoms of bleeding such as bloody or black, tarry stools; red or dark brown urine; spitting up blood or brown material that looks like coffee grounds; red spots on the skin; unusual bruising or bleeding from the eyes, gums, or nose  signs and symptoms of infection like fever; chills; cough; sore throat; pain or trouble passing urine  signs and symptoms of kidney injury like trouble passing urine or change in the amount of urine  signs and symptoms of low red blood cells or anemia such as unusually weak or tired; feeling faint or lightheaded; falls; breathing problems Side effects that usually do not require medical attention (report to your doctor or health care professional if they continue or are bothersome):  loss of appetite  mouth sores  muscle cramps This list may not describe all possible side effects. Call your doctor for medical advice about side effects. You may report side effects to FDA at 1-800-FDA-1088. Where should I keep my  medicine? This drug is given in a hospital or clinic and will not be stored at home. NOTE: This sheet is a summary. It may not cover all possible information. If you have questions about this medicine, talk to your doctor, pharmacist, or health care provider.  2021 Elsevier/Gold Standard (2018-05-25 15:59:17) Vinblastine injection What is this medicine? VINBLASTINE (vin BLAS teen) is a chemotherapy drug. It slows the growth of cancer cells. This medicine is used to treat many types of cancer like breast cancer, testicular cancer, Hodgkin's disease, non-Hodgkin's lymphoma, and sarcoma. This medicine may be used for other purposes; ask your health care provider or pharmacist if you have questions. COMMON BRAND NAME(S): Velban What should I tell my health care provider before I take this medicine? They need to know if you have any of these conditions:  blood disorders  dental disease  gout  infection (especially a virus infection such as chickenpox, cold sores, or herpes)  liver disease  lung disease  nervous system disease  recent or ongoing radiation therapy  an unusual or allergic reaction to vinblastine, other chemotherapy agents, other medicines, foods, dyes, or preservatives  pregnant or trying to get pregnant  breast-feeding How should I use this medicine? This drug is given as an infusion into a vein. It is administered in a hospital or clinic by a specially trained health  care professional. If you have pain, swelling, burning or any unusual feeling around the site of your injection, tell your health care professional right away. Talk to your pediatrician regarding the use of this medicine in children. While this drug may be prescribed for selected conditions, precautions do apply. Overdosage: If you think you have taken too much of this medicine contact a poison control center or emergency room at once. NOTE: This medicine is only for you. Do not share this medicine with  others. What if I miss a dose? It is important not to miss your dose. Call your doctor or health care professional if you are unable to keep an appointment. What may interact with this medicine?  erythromycin  certain medicines for fungal infections like itraconazole, ketoconazole, posaconazole, voriconazole  certain medicines for seizures like phenytoin This list may not describe all possible interactions. Give your health care provider a list of all the medicines, herbs, non-prescription drugs, or dietary supplements you use. Also tell them if you smoke, drink alcohol, or use illegal drugs. Some items may interact with your medicine. What should I watch for while using this medicine? Your condition will be monitored carefully while you are receiving this medicine. You will need important blood work done while you are taking this medicine. This drug may make you feel generally unwell. This is not uncommon, as chemotherapy can affect healthy cells as well as cancer cells. Report any side effects. Continue your course of treatment even though you feel ill unless your doctor tells you to stop. In some cases, you may be given additional medicines to help with side effects. Follow all directions for their use. Call your doctor or health care professional for advice if you get a fever, chills or sore throat, or other symptoms of a cold or flu. Do not treat yourself. This drug decreases your body's ability to fight infections. Try to avoid being around people who are sick. This medicine may increase your risk to bruise or bleed. Call your doctor or health care professional if you notice any unusual bleeding. Be careful brushing and flossing your teeth or using a toothpick because you may get an infection or bleed more easily. If you have any dental work done, tell your dentist you are receiving this medicine. Avoid taking products that contain aspirin, acetaminophen, ibuprofen, naproxen, or ketoprofen  unless instructed by your doctor. These medicines may hide a fever. Do not become pregnant while taking this medicine. Women should inform their doctor if they wish to become pregnant or think they might be pregnant. There is a potential for serious side effects to an unborn child. Talk to your health care professional or pharmacist for more information. Do not breast-feed an infant while taking this medicine. Men may have a lower sperm count while taking this medicine. Talk to your doctor if you plan to father a child. What side effects may I notice from receiving this medicine? Side effects that you should report to your doctor or health care professional as soon as possible:  allergic reactions like skin rash, itching or hives, swelling of the face, lips, or tongue  low blood counts - This drug may decrease the number of white blood cells, red blood cells and platelets. You may be at increased risk for infections and bleeding.  signs of infection - fever or chills, cough, sore throat, pain or difficulty passing urine  signs of decreased platelets or bleeding - bruising, pinpoint red spots on the skin, black, tarry  stools, nosebleeds  signs of decreased red blood cells - unusually weak or tired, fainting spells, lightheadedness  breathing problems  changes in hearing  change in the amount of urine  chest pain  high blood pressure  mouth sores  nausea and vomiting  pain, swelling, redness or irritation at the injection site  pain, tingling, numbness in the hands or feet  problems with balance, dizziness  seizures Side effects that usually do not require medical attention (report to your doctor or health care professional if they continue or are bothersome):  constipation  hair loss  jaw pain  loss of appetite  sensitivity to light  stomach pain  tumor pain This list may not describe all possible side effects. Call your doctor for medical advice about side effects.  You may report side effects to FDA at 1-800-FDA-1088. Where should I keep my medicine? This drug is given in a hospital or clinic and will not be stored at home. NOTE: This sheet is a summary. It may not cover all possible information. If you have questions about this medicine, talk to your doctor, pharmacist, or health care provider.  2021 Elsevier/Gold Standard (2019-04-30 16:42:23) Doxorubicin injection What is this medicine? DOXORUBICIN (dox oh ROO bi sin) is a chemotherapy drug. It is used to treat many kinds of cancer like leukemia, lymphoma, neuroblastoma, sarcoma, and Wilms' tumor. It is also used to treat bladder cancer, breast cancer, lung cancer, ovarian cancer, stomach cancer, and thyroid cancer. This medicine may be used for other purposes; ask your health care provider or pharmacist if you have questions. COMMON BRAND NAME(S): Adriamycin, Adriamycin PFS, Adriamycin RDF, Rubex What should I tell my health care provider before I take this medicine? They need to know if you have any of these conditions:  heart disease  history of low blood counts caused by a medicine  liver disease  recent or ongoing radiation therapy  an unusual or allergic reaction to doxorubicin, other chemotherapy agents, other medicines, foods, dyes, or preservatives  pregnant or trying to get pregnant  breast-feeding How should I use this medicine? This drug is given as an infusion into a vein. It is administered in a hospital or clinic by a specially trained health care professional. If you have pain, swelling, burning or any unusual feeling around the site of your injection, tell your health care professional right away. Talk to your pediatrician regarding the use of this medicine in children. Special care may be needed. Overdosage: If you think you have taken too much of this medicine contact a poison control center or emergency room at once. NOTE: This medicine is only for you. Do not share this  medicine with others. What if I miss a dose? It is important not to miss your dose. Call your doctor or health care professional if you are unable to keep an appointment. What may interact with this medicine? This medicine may interact with the following medications:  6-mercaptopurine  paclitaxel  phenytoin  St. John's Wort  trastuzumab  verapamil This list may not describe all possible interactions. Give your health care provider a list of all the medicines, herbs, non-prescription drugs, or dietary supplements you use. Also tell them if you smoke, drink alcohol, or use illegal drugs. Some items may interact with your medicine. What should I watch for while using this medicine? This drug may make you feel generally unwell. This is not uncommon, as chemotherapy can affect healthy cells as well as cancer cells. Report any side effects.  Continue your course of treatment even though you feel ill unless your doctor tells you to stop. There is a maximum amount of this medicine you should receive throughout your life. The amount depends on the medical condition being treated and your overall health. Your doctor will watch how much of this medicine you receive in your lifetime. Tell your doctor if you have taken this medicine before. You may need blood work done while you are taking this medicine. Your urine may turn red for a few days after your dose. This is not blood. If your urine is dark or brown, call your doctor. In some cases, you may be given additional medicines to help with side effects. Follow all directions for their use. Call your doctor or health care professional for advice if you get a fever, chills or sore throat, or other symptoms of a cold or flu. Do not treat yourself. This drug decreases your body's ability to fight infections. Try to avoid being around people who are sick. This medicine may increase your risk to bruise or bleed. Call your doctor or health care professional if  you notice any unusual bleeding. Talk to your doctor about your risk of cancer. You may be more at risk for certain types of cancers if you take this medicine. Do not become pregnant while taking this medicine or for 6 months after stopping it. Women should inform their doctor if they wish to become pregnant or think they might be pregnant. Men should not father a child while taking this medicine and for 6 months after stopping it. There is a potential for serious side effects to an unborn child. Talk to your health care professional or pharmacist for more information. Do not breast-feed an infant while taking this medicine. This medicine has caused ovarian failure in some women and reduced sperm counts in some men This medicine may interfere with the ability to have a child. Talk with your doctor or health care professional if you are concerned about your fertility. This medicine may cause a decrease in Co-Enzyme Q-10. You should make sure that you get enough Co-Enzyme Q-10 while you are taking this medicine. Discuss the foods you eat and the vitamins you take with your health care professional. What side effects may I notice from receiving this medicine? Side effects that you should report to your doctor or health care professional as soon as possible:  allergic reactions like skin rash, itching or hives, swelling of the face, lips, or tongue  breathing problems  chest pain  fast or irregular heartbeat  low blood counts - this medicine may decrease the number of white blood cells, red blood cells and platelets. You may be at increased risk for infections and bleeding.  pain, redness, or irritation at site where injected  signs of infection - fever or chills, cough, sore throat, pain or difficulty passing urine  signs of decreased platelets or bleeding - bruising, pinpoint red spots on the skin, black, tarry stools, blood in the urine  swelling of the ankles, feet,  hands  tiredness  weakness Side effects that usually do not require medical attention (report to your doctor or health care professional if they continue or are bothersome):  diarrhea  hair loss  mouth sores  nail discoloration or damage  nausea  red colored urine  vomiting This list may not describe all possible side effects. Call your doctor for medical advice about side effects. You may report side effects to FDA at 1-800-FDA-1088.  Where should I keep my medicine? This drug is given in a hospital or clinic and will not be stored at home. NOTE: This sheet is a summary. It may not cover all possible information. If you have questions about this medicine, talk to your doctor, pharmacist, or health care provider.  2021 Elsevier/Gold Standard (2017-01-11 11:01:26)

## 2020-07-10 ENCOUNTER — Ambulatory Visit: Payer: 59

## 2020-07-10 ENCOUNTER — Other Ambulatory Visit: Payer: Self-pay | Admitting: Hematology & Oncology

## 2020-07-10 ENCOUNTER — Other Ambulatory Visit: Payer: Self-pay

## 2020-07-10 ENCOUNTER — Inpatient Hospital Stay: Payer: 59

## 2020-07-10 VITALS — BP 131/80 | HR 73 | Temp 98.9°F | Resp 17

## 2020-07-10 DIAGNOSIS — C799 Secondary malignant neoplasm of unspecified site: Secondary | ICD-10-CM

## 2020-07-10 DIAGNOSIS — C679 Malignant neoplasm of bladder, unspecified: Secondary | ICD-10-CM

## 2020-07-10 DIAGNOSIS — Z5111 Encounter for antineoplastic chemotherapy: Secondary | ICD-10-CM | POA: Diagnosis not present

## 2020-07-10 MED ORDER — PEGFILGRASTIM INJECTION 6 MG/0.6ML ~~LOC~~
6.0000 mg | PREFILLED_SYRINGE | Freq: Once | SUBCUTANEOUS | Status: AC
  Administered 2020-07-10: 6 mg via SUBCUTANEOUS
  Filled 2020-07-10: qty 0.6

## 2020-07-10 MED FILL — DEXAMETHASONE 4 MG TABLET: 4 | 15 days supply | Qty: 30 | Fill #0

## 2020-07-10 MED FILL — PROCHLORPERAZINE 10 MG TAB: 10 | 8 days supply | Qty: 30 | Fill #1

## 2020-07-10 MED FILL — ZOLPIDEM TARTRATE 10 MG TAB: 10 | 30 days supply | Qty: 30 | Fill #0

## 2020-07-10 MED FILL — GABAPENTIN 600 MG TABLET: 600 | 30 days supply | Qty: 120 | Fill #3

## 2020-07-10 NOTE — Patient Instructions (Signed)
Neutropenia Neutropenia is a condition that occurs when you have a lower-than-normal level of a type of white blood cell (neutrophil) in your body. Neutrophils are made in the spongy center of large bones (bone marrow), and they fight infections. Neutrophils are your body's main defense against bacterial and fungal infections. The fewer neutrophils you have and the longer your body remains without them, the greater your risk of getting a severe infection. What are the causes? This condition can occur if your body uses up or destroys neutrophils faster than your bone marrow can make them. Neutropenia may be caused by:  A bacterial or fungal infection.  Allergic disorders.  Reactions to some medicines.  An autoimmune disease.  An enlarged spleen. This condition can also occur if your bone marrow does not produce enough neutrophils. This problem may be caused by:  Cancer.  Cancer treatments, such as radiation or chemotherapy.  Viral infections.  Medicines, such as phenytoin.  Vitamin B12 deficiency.  Diseases of the bone marrow.  Environmental toxins, such as insecticides. What are the signs or symptoms? This condition does not usually cause symptoms. If symptoms are present, they are usually caused by an underlying infection. Symptoms of an infection may include:  Fever.  Chills.  Swollen glands.  Oral or anal ulcers.  Cough and shortness of breath.  Rash.  Skin infection.  Fatigue. How is this diagnosed? Your health care provider may suspect neutropenia if you have:  A condition that may cause neutropenia.  Symptoms during or after treatment for cancer.  Symptoms of infection, especially fever.  Frequent and unusual infections. This condition is diagnosed based on your medical history and a physical exam. Tests will also be done, such as:  A complete blood count (CBC).  A procedure to collect a sample of bone marrow for examination (bone marrow  biopsy).  A chest X-ray.  A urine culture.  A blood culture. How is this treated? Treatment depends on the underlying cause and severity of your condition. Mild neutropenia may not require treatment. Treatment may include medicines, such as:  Antibiotic medicine given through an IV.  Antiviral medicines.  Antifungal medicines.  A medicine to increase neutrophil production (colony-stimulating factor). You may get this drug through an IV or by injection.  Steroids given through an IV. If an underlying condition is causing neutropenia, you may need treatment for that condition. If medicines or cancer treatments are causing neutropenia, your health care provider may have you stop the medicines or treatment. Follow these instructions at home: Medicines  Take over-the-counter and prescription medicines only as told by your health care provider.  Get a seasonal flu shot (influenza vaccine).  Avoid people who received a vaccine in the past 30 days if that vaccine contained a live version of the germ (live vaccine). You should not get a live vaccine. Common live vaccines are polio, MMR, chicken pox, and shingles vaccines.   Eating and drinking  Do not share food utensils.  Do not eat unpasteurized foods.  Do not eat raw or undercooked meat, eggs, or seafood.  Do not eat unwashed, raw fruits or vegetables. Lifestyle  Avoid exposure to groups of people or children.  Avoid being around people who are sick.  Avoid being around dirt or dust, such as in construction areas or gardens.  Do not provide direct care for pets. Avoid animal droppings. Do not clean litter boxes and bird cages.  Do not have sex unless your health care provider has approved. Hygiene  Bathe daily.  Clean the area between the genitals and the anus (perineal area) after you urinate or have a bowel movement. If you are male, wipe from front to back.  Brush your teeth with a soft toothbrush before and after  meals.  Do not use a regular razor. Use an electric razor to remove hair.  Wash your hands often. Make sure others who come in contact with you also wash their hands. If soap and water are not available, use hand sanitizer.   General instructions  Follow any precautions as told by your health care provider to reduce your risk for injury or infection.  Take actions to avoid cuts and burns. For example: ? Be cautious when you use knives. Always cut away from yourself. ? Keep knives in protective sheaths or guards when not in use. ? Use oven mitts when you cook with a hot stove, oven, or grill. ? Stand a safe distance away from open fires.  Do not use tampons, enemas, or rectal suppositories unless your health care provider has approved.  Keep all follow-up visits as told by your health care provider. This is important. Contact a health care provider if:  You have: ? A sore throat. ? A warm, red, or tender area on your skin. ? A cough. ? Frequent or painful urination. ? Vaginal discharge or itching.  You develop: ? Sores in your mouth or anus. ? Swollen lymph nodes. ? Red streaks on the skin. ? A rash. Get help right away if:  You have: ? A fever. ? Chills, or you start to shake.  You feel: ? Nauseous, or you vomit. ? Very fatigued. ? Short of breath. Summary  Neutropenia is a condition that occurs when you have a lower-than-normal level of a type of white blood cell (neutrophil) in your body.  This condition can occur if your body uses up or destroys neutrophils faster than your bone marrow can make them.  Treatment depends on the underlying cause and severity of your condition. Mild neutropenia may not require treatment.  Follow any precautions as told by your health care provider to reduce your risk for injury or infection. This information is not intended to replace advice given to you by your health care provider. Make sure you discuss any questions you have with  your health care provider. Document Revised: 03/15/2018 Document Reviewed: 03/15/2018 Elsevier Patient Education  2021 [employer].

## 2020-07-13 MED FILL — ENOXAPARIN SODIUM 80 MG/0.8: 80 | 12 days supply | Qty: 19 | Fill #3

## 2020-07-15 ENCOUNTER — Telehealth: Payer: Self-pay

## 2020-07-15 NOTE — Telephone Encounter (Signed)
S/w pt and he is aware of his mri/pet appts 07/27/20 and to reg at 6:30, npo       Brian Benton

## 2020-07-21 ENCOUNTER — Ambulatory Visit
Admission: RE | Admit: 2020-07-21 | Discharge: 2020-07-21 | Disposition: A | Discharge status: Home / Self Care | Payer: 59 | Source: Ambulatory Visit | Attending: Interventional Radiology | Admitting: Interventional Radiology

## 2020-07-21 DIAGNOSIS — I87002 Postthrombotic syndrome without complications of left lower extremity: Secondary | ICD-10-CM

## 2020-07-21 HISTORY — PX: IR RADIOLOGIST EVAL & MGMT: IMG5224

## 2020-07-21 NOTE — Progress Notes (Addendum)
Chief Complaint: Left Leg Swelling  Referring Physician(s): Jake Seats, MD  History of Present Illness: Brian Benton is a 65 y.o. 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    He is here today in the clinic by himself for our appointment.   He is a gentleman with a left pelvic mass diagnosed as metastatic urothelial carcinoma -- biopsy proven 03/03/20 -- contributing to left iliofemoral DVT.  His first treatment was 03/13/20 with thrombectomy and iliac vein stent (Wallstent).  Given persisting symptoms of pain, swelling, leg heaviness, discoloration, pitting edema, he had follow up DVT study.  This revealed persisting femoral vein DVT, and evidence of iliac stent thrombosis.    He then underwent a staged procedure on 06/08/20 and 06/17/20, with re-lining of the occluded left iliac stents, then crossing and prolonged balloon angioplasty of the left FV, restoring in-line venous flow from the left calf to the IVC.    Today, [name] tells me that he is essentially symptom free.  He continues to receive chemotherapy with Dr. Antonieta Pert team.  He has resumed working out at a low level comfortably, and denies any residual swelling, pain, discoloration, or asymmetry.  He does state that some activity results in "pins-and-needles".    He continues on lovenox at this time, and baby aspirin daily. He continues to use both a knee-high and thigh-high compression stocking during the day.   Past Medical History:  Diagnosis Date  . Cancer ([place])    bladder  . History of bladder cancer    s/p  TURBT 07-25-2014  . Hyperlipidemia   . Mass of left hand   . 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 BLADDER SURGERY     [place]  . COLONOSCOPY    . CYSTECTOMY    . CYSTOSCOPY WITH BIOPSY N/A 07/25/2014   Procedure: CYSTOSCOPY WITH BLADDER BIOPSY AND  FULGERATION;  Surgeon: [name], MD;  Location: [facility];  Service: Urology;  Laterality: N/A;  . CYSTOSCOPY WITH BIOPSY N/A 11/28/2014   Procedure: CYSTOSCOPY WITH  BLADDER BIOPSY;  Surgeon: [name], MD;  Location: [facility];  Service: Urology;  Laterality: N/A;  . HEMORRHOID SURGERY  03/15/2012   Procedure: HEMORRHOIDECTOMY;  Surgeon: [name], MD;  Location: [facility];  Service: General;  Laterality: N/A;  . HERNIA REPAIR    . INGUINAL HERNIA REPAIR Bilateral 2006  . INTRAVASCULAR ULTRASOUND/IVUS N/A 03/13/2020   Procedure: Intravascular Ultrasound/IVUS;  Surgeon: Elam Dutch, MD;  Location: [place] [facility];  Service: Cardiovascular;  Laterality: N/A;  . IR IMAGING GUIDED PORT INSERTION  06/01/2020  . IR INTRAVASCULAR ULTRASOUND NON CORONARY  06/08/2020  . IR INTRAVASCULAR ULTRASOUND NON CORONARY  06/17/2020  . IR IVC FILTER PLMT / S&I /IMG GUID/MOD SED  06/08/2020  . IR PTA VENOUS EXCEPT DIALYSIS CIRCUIT  06/08/2020  . IR RADIOLOGIST EVAL & MGMT  04/30/2020  . IR RADIOLOGIST EVAL & MGMT  05/27/2020  . IR TRANSCATH PLC STENT  INITIAL VEIN  INC ANGIOPLASTY  06/17/2020  . IR TRANSCATH PLC STENT 1ST ART NOT LE CV CAR VERT CAR  06/08/2020  . IR US GUIDE VASC ACCESS LEFT  06/08/2020  . IR US GUIDE VASC ACCESS LEFT  06/17/2020  . IR VENO/EXT/UNI LEFT  06/08/2020  . IR VENO/EXT/UNI LEFT  06/17/2020  .  IVC VENOGRAPHY N/A 03/13/2020   Procedure: IVC Venography;  Surgeon: Elam Dutch, MD;  Location: [place] [facility];  Service: Cardiovascular;  Laterality: N/A;  . LOWER EXTREMITY VENOGRAPHY Left 03/13/2020   Procedure: LOWER EXTREMITY VENOGRAPHY;  Surgeon: Elam Dutch, MD;  Location: [place] [facility];  Service: Cardiovascular;  Laterality: Left;  Marland Kitchen MASS EXCISION Left 11/12/2018   Procedure: EXCISION MASS LEFT HAND;  Surgeon: Daryll Brod, MD;  Location: [place];  Service: Orthopedics;  Laterality: Left;  FAB  .  PERIPHERAL VASCULAR INTERVENTION  03/13/2020   Procedure: PERIPHERAL VASCULAR INTERVENTION;  Surgeon: Elam Dutch, MD;  Location: [place] [facility];  Service: Cardiovascular;;  . PERIPHERAL VASCULAR THROMBECTOMY N/A 03/13/2020   Procedure: PERIPHERAL VASCULAR THROMBECTOMY;  Surgeon: Elam Dutch, MD;  Location: [place] [facility];  Service: Cardiovascular;  Laterality: N/A;  . RADIOLOGY WITH ANESTHESIA N/A 02/20/2020   Procedure: MRI LUMBAR W/O CONTRAST  WITH ANESTHESIA;  Surgeon: Radiologist, Medication, MD;  Location: [place];  Service: Radiology;  Laterality: N/A;  . RADIOLOGY WITH ANESTHESIA N/A 06/08/2020   Procedure: IR WITH ANESTHESIA VENOGRAM;  Surgeon: Radiologist, Medication, MD;  Location: [place];  Service: Radiology;  Laterality: N/A;  . TESTICLE SURGERY  2004   Ruptured Undescended Right testicle   . TRANSURETHRAL RESECTION OF BLADDER TUMOR WITH GYRUS (TURBT-GYRUS) N/A 05/23/2014   Procedure: TRANSURETHRAL RESECTION OF BLADDER TUMOR WITH GYRUS (TURBT-GYRUS);  Surgeon: [name], MD;  Location: [facility];  Service: Urology;  Laterality: N/A;  . WISDOM TOOTH EXTRACTION      Allergies: Patient has no known allergies.  Medications: Prior to Admission medications   Medication Sig Start Date End Date Taking? Authorizing Provider  ASPIRIN ADULT LOW STRENGTH 81 MG EC tablet TAKE 1 TABLET BY MOUTH EVERY DAY Patient taking differently: Take 81 mg by mouth daily. 10/03/19   Patwardhan, Reynold Bowen, MD  Calcium Carbonate-Vit D-Min (CALTRATE PLUS PO) Take 1 tablet by mouth daily.    [provider]  dexamethasone (DECADRON) 4 MG tablet TAKE TWO TABLETS (8 MG TOTAL) BY MOUTH DAILY WITH FOOD FOR THREE DAYS STARTING THE DAY AFTER CISPLATIN CHEMOTHERAPY. 07/10/20   Volanda Napoleon, MD  enoxaparin (LOVENOX) 80 MG/0.8ML injection Inject 0.8 mLs (80 mg total) into the skin every 12 (twelve) hours. 05/20/20   Volanda Napoleon, MD  fentaNYL (DURAGESIC) 50 MCG/HR PLACE  1 PATCH ONTO THE SKIN EVERY 3 (THREE) DAYS. 07/01/20   Volanda Napoleon, MD  gabapentin (NEURONTIN) 600 MG tablet Take 1 tablet (600 mg total) by mouth in the morning, at noon, in the evening, and at bedtime. Patient not taking: No sig reported 04/13/20   Volanda Napoleon, MD  Lactulose 20 GM/30ML SOLN Take 30 mLs (20 g total) by mouth every 4 (four) hours as needed. Patient not taking: Reported on 07/08/2020 06/26/20   Volanda Napoleon, MD  lidocaine-prilocaine (EMLA) cream Apply to affected area once Patient taking differently: Apply 1 application topically once. Apply to affected area once 06/02/20   Ennever, Rudell Cobb, MD  LORazepam (ATIVAN) 0.5 MG tablet TAKE 1 TABLET (0.5 MG TOTAL) BY MOUTH EVERY 6 (SIX) HOURS AS NEEDED FOR ANXIETY. 06/29/20   Volanda Napoleon, MD  meloxicam (MOBIC) 15 MG tablet Take 1 tablet (15 mg total) by mouth daily. Patient not taking: No sig reported 03/20/20   Volanda Napoleon, MD  Multiple Vitamins-Minerals (MULTIVITAMIN WITH MINERALS) tablet Take 1 tablet by mouth daily.  [provider]  ondansetron (ZOFRAN ODT) 8 MG disintegrating tablet Take 1 tablet (8 mg total) by mouth every 8 (eight) hours as needed for nausea or vomiting. 06/10/20   Volanda Napoleon, MD  Oxycodone HCl 10 MG TABS TAKE 1 TABLET (10 MG TOTAL) BY MOUTH EVERY 4 (FOUR) HOURS AS NEEDED. 06/16/20   Volanda Napoleon, MD  pantoprazole (PROTONIX) 40 MG tablet Take 40 mg by mouth daily. Patient not taking: Reported on 07/08/2020 06/29/20   [provider]  sodium chloride (OCEAN) 0.65 % SOLN nasal spray Place 1 spray into both nostrils daily as needed for congestion (Allergies).    [provider]  zolpidem (AMBIEN) 10 MG tablet TAKE 1 TABLET (10 MG TOTAL) BY MOUTH AT BEDTIME AS NEEDED FOR SLEEP. 07/10/20 08/09/20  Volanda Napoleon, MD     Family History  Problem Relation Age of Onset  . Cancer Mother        leukemia  . Diabetes Brother     Social History   Socioeconomic History   . Marital status: Married    Spouse name: Not on file  . Number of children: 2  . Years of education: Not on file  . Highest education level: Not on file  Occupational History  . Not on file  Tobacco Use  . Smoking status: Light Tobacco Smoker    Packs/day: 0.25    Years: 30.00    Pack years: 7.50    Types: Cigarettes  . Smokeless tobacco: Never Used  . Tobacco comment: Smokes 2/day.  Vaping Use  . Vaping Use: Never used  Substance and Sexual Activity  . Alcohol use: Not Currently    Comment: many 4 times a years a social drink  . Drug use: No  . Sexual activity: Not on file  Other Topics Concern  . Not on file  Social History Narrative  . Not on file   Social Determinants of Health   Financial Resource Strain: Not on file  Food Insecurity: Not on file  Transportation Needs: Not on file  Physical Activity: Not on file  Stress: Not on file  Social Connections: Not on file      Review of Systems: A 12 point ROS discussed and pertinent positives are indicated in the HPI above.  All other systems are negative.  Review of Systems  Vital Signs: There were no vitals taken for this visit.  Physical Exam Targeted exam: Relatively Symmetric lower extremity: Right Thigh circumference: 22 inches Left Thigh circum: 19 inches Right calf circum: 13 inches Left calf circum: 13 inches No edema, redness, or wounds.  No varicose veins.  Access sites of the femoral vein and the popliteal vein/PT vein are healed.      Imaging: No results found.  Labs:  CBC: Recent Labs    06/08/20 0725 06/17/20 0730 06/24/20 0830 07/08/20 0800  WBC 19.1* 13.6* 8.5 13.2*  HGB 10.4* 7.3* 9.3* 9.5*  HCT 30.4* 23.0* 28.4* 28.5*  PLT 111* 284 265 188    COAGS: Recent Labs    03/02/20 0105 06/01/20 1217 06/08/20 0725 06/17/20 0730  INR 1.0 1.1 0.9 0.9    BMP: Recent Labs    03/02/20 0105 03/03/20 0733 03/04/20 0337 03/11/20 0813 03/18/20 0858 06/08/20 0725  06/17/20 0730 06/24/20 0830 07/08/20 0800  NA 135 134* 134* 134*   < > 137 136 136 137  K 5.1 4.8 4.1 4.8   < > 3.8 3.8 3.9 4.3  CL 98 98 100  98   < > 101 103 104 103  CO2 28 25 26  32   < > 26 23 25 26   GLUCOSE 119* 121* 131* 94   < > 100* 105* 107* 109*  BUN 21 18 13 14    < > 13 17 23 17   CALCIUM 9.3 9.3 8.7* 10.2   < > 9.1 8.9 9.9 9.7  CREATININE 0.94 0.74 0.69 0.72   < > 0.84 1.10 1.00 1.07  GFRNONAA >60 >60 >60 >60   < > >60 >60 >60 >60  GFRAA >60 >60 >60 >60  --   --   --   --   --    < > = values in this interval not displayed.    LIVER FUNCTION TESTS: Recent Labs    05/20/20 0807 06/02/20 0815 06/24/20 0830 07/08/20 0800  BILITOT 0.3 0.3 0.2* 0.2*  AST 15 13* 10* 10*  ALT 15 13 10 10   ALKPHOS 42 37* 58 70  PROT 7.0 6.8 6.6 6.8  ALBUMIN 4.1 4.0 4.1 3.9    TUMOR MARKERS: No results for input(s): AFPTM, CEA, CA199, CHROMGRNA in the last 8760 hours.  Assessment and Plan:   Mr Hutcherson is 65 yo gentleman who is SP treatment of provoked, recurrent left iliofemoral DVT, with iliac venous stenting and PTA of left femoral vein.   He is essentially symptom free at this point and very satisfied with results.   We discussed the medical therapy and other conservative measures to maintain his quality of life from the stand point of post-thrombotic syndrome.    Regarding blood thinners, we would endorse a transition now to oral [facility] of hematology's choice, now that it has been over 30 days since the last intervention.  We also endorse continuing on 81mg  ASA indefinitely.    Regarding compression stockings, these are indicated indefinitely, which he understands.    He also asked about physical therapy/massage for residual symptoms of post-thrombotic syndrome, and I am happy to refer him to our physical therapy team for evaluation.     Plan: - I have encouraged him to follow up with Dr. Antonieta Pert team to discuss transitioning from Lovenox to oral AC agent  - continue 81mg  asa  daily - compression stockings are indicated - we will refer to our physical therapy team to discuss complete decompressive/decongestive therapy (CDT) for post-thrombotic syndrome.  Discussed with Ms [facility].  Best method is Epic order for referral, with "Location: [facility]".    - he knows that he can follow up with [place] on an as-needed basis should anything change   Electronically Signed: Corrie Mckusick 07/21/2020, 8:51 AM   I spent a total of    25 Minutes in face to face in clinical consultation, greater than 50% of which was counseling/coordinating care for left leg recurrent provoked DVT, SP treatment with iliac stenting and femoral vein angioplasty

## 2020-07-27 ENCOUNTER — Encounter: Payer: Self-pay | Admitting: *Deleted

## 2020-07-27 ENCOUNTER — Ambulatory Visit (HOSPITAL_COMMUNITY)
Admission: RE | Admit: 2020-07-27 | Discharge: 2020-07-27 | Disposition: A | Discharge status: Home / Self Care | Payer: 59 | Source: Ambulatory Visit | Attending: Hematology & Oncology | Admitting: Hematology & Oncology

## 2020-07-27 ENCOUNTER — Other Ambulatory Visit: Payer: Self-pay

## 2020-07-27 DIAGNOSIS — C799 Secondary malignant neoplasm of unspecified site: Secondary | ICD-10-CM | POA: Diagnosis present

## 2020-07-27 DIAGNOSIS — C679 Malignant neoplasm of bladder, unspecified: Secondary | ICD-10-CM | POA: Insufficient documentation

## 2020-07-27 LAB — GLUCOSE, CAPILLARY: Glucose-Capillary: 94 mg/dL (ref 70–99)

## 2020-07-27 MED ORDER — FLUDEOXYGLUCOSE F - 18 (FDG) INJECTION
8.0400 | Freq: Once | INTRAVENOUS | Status: AC | PRN
  Administered 2020-07-27: 8.04 via INTRAVENOUS

## 2020-07-27 MED ORDER — GADOBUTROL 1 MMOL/ML IV SOLN
5.0000 mL | Freq: Once | INTRAVENOUS | Status: AC | PRN
  Administered 2020-07-27: 5 mL via INTRAVENOUS

## 2020-07-27 MED FILL — LORAZEPAM 0.5 MG TABS: 0.5 | 23 days supply | Qty: 90 | Fill #1

## 2020-07-27 NOTE — Progress Notes (Signed)
Oncology Nurse Navigator Documentation  Oncology Nurse Navigator Flowsheets 07/27/2020  Abnormal Finding Date -  Confirmed Diagnosis Date -  Diagnosis Status -  Planned Course of Treatment -  Phase of Treatment -  Chemotherapy Pending- Reason: -  Chemo/Radiation Concurrent Actual Start Date: -  Navigator Follow Up Date: 07/29/2020  Navigator Follow Up Reason: Follow-up Appointment  Navigator Location [facility]  Referral Date to RadOnc/MedOnc -  Navigator Encounter Type Scan Review;Telephone  Telephone Incoming Call  Treatment Initiated Date -  Patient Visit Type MedOnc  Treatment Phase Active Tx  Barriers/Navigation Needs Coordination of Care;Education  Education Other  Interventions Education;Psycho-Social Support  Acuity Level 2-Minimal Needs (1-2 Barriers Identified)  Referrals -  Coordination of Care -  Education Method Verbal  Support Groups/Services Friends and Family  Time Spent with Patient 15

## 2020-07-28 ENCOUNTER — Encounter: Payer: Self-pay | Admitting: *Deleted

## 2020-07-28 NOTE — Progress Notes (Signed)
Results given to patient.  Volanda Napoleon, MD  P Onc Nurse Hp Call and say that the PET scan is really about the same. There is still some activity noted down in the area of tumor. This might be a little more active on the PET scan but this could also be part of his treatment causing this.   Volanda Napoleon, MD  P Onc Nurse Hp Call and let him know that the MRI shows of the tumor is still shrinking. This is very encouraging. Thanks!   [facility]   Oncology Nurse Navigator Documentation  Oncology Nurse Navigator Flowsheets 07/28/2020  Abnormal Finding Date -  Confirmed Diagnosis Date -  Diagnosis Status -  Planned Course of Treatment -  Phase of Treatment -  Chemotherapy Pending- Reason: -  Chemo/Radiation Concurrent Actual Start Date: -  Navigator Follow Up Date: 07/29/2020  Navigator Follow Up Reason: Follow-up Appointment  Navigator Location [facility]  Referral Date to RadOnc/MedOnc -  Navigator Encounter Type Diagnostic Results  Telephone Outgoing Call  Treatment Initiated Date -  Patient Visit Type MedOnc  Treatment Phase Active Tx  Barriers/Navigation Needs Coordination of Care;Education  Education Other  Interventions Education;Psycho-Social Support  Acuity Level 2-Minimal Needs (1-2 Barriers Identified)  Referrals -  Coordination of Care -  Education Method Verbal  Support Groups/Services Friends and Family  Time Spent with Patient 15

## 2020-07-29 ENCOUNTER — Other Ambulatory Visit: Payer: Self-pay | Admitting: Hematology & Oncology

## 2020-07-29 ENCOUNTER — Other Ambulatory Visit: Payer: Self-pay

## 2020-07-29 ENCOUNTER — Encounter: Payer: Self-pay | Admitting: Hematology & Oncology

## 2020-07-29 ENCOUNTER — Inpatient Hospital Stay (HOSPITAL_BASED_OUTPATIENT_CLINIC_OR_DEPARTMENT_OTHER): Payer: 59 | Admitting: Hematology & Oncology

## 2020-07-29 ENCOUNTER — Inpatient Hospital Stay: Payer: 59

## 2020-07-29 ENCOUNTER — Inpatient Hospital Stay: Payer: 59 | Attending: Hematology & Oncology

## 2020-07-29 ENCOUNTER — Encounter: Payer: Self-pay | Admitting: *Deleted

## 2020-07-29 VITALS — BP 119/73 | HR 82 | Temp 99.0°F | Resp 17 | Wt 159.0 lb

## 2020-07-29 DIAGNOSIS — C679 Malignant neoplasm of bladder, unspecified: Secondary | ICD-10-CM | POA: Diagnosis present

## 2020-07-29 DIAGNOSIS — Z5111 Encounter for antineoplastic chemotherapy: Secondary | ICD-10-CM | POA: Diagnosis present

## 2020-07-29 DIAGNOSIS — C799 Secondary malignant neoplasm of unspecified site: Secondary | ICD-10-CM

## 2020-07-29 LAB — CBC WITH DIFFERENTIAL (CANCER CENTER ONLY)
Abs Immature Granulocytes: 0.13 10*3/uL — ABNORMAL HIGH (ref 0.00–0.07)
Basophils Absolute: 0.1 10*3/uL (ref 0.0–0.1)
Basophils Relative: 0 %
Eosinophils Absolute: 0.1 10*3/uL (ref 0.0–0.5)
Eosinophils Relative: 0 %
HCT: 26.7 % — ABNORMAL LOW (ref 39.0–52.0)
Hemoglobin: 8.9 g/dL — ABNORMAL LOW (ref 13.0–17.0)
Immature Granulocytes: 1 %
Lymphocytes Relative: 4 %
Lymphs Abs: 0.5 10*3/uL — ABNORMAL LOW (ref 0.7–4.0)
MCH: 33.8 pg (ref 26.0–34.0)
MCHC: 33.3 g/dL (ref 30.0–36.0)
MCV: 101.5 fL — ABNORMAL HIGH (ref 80.0–100.0)
Monocytes Absolute: 1.1 10*3/uL — ABNORMAL HIGH (ref 0.1–1.0)
Monocytes Relative: 8 %
Neutro Abs: 12.1 10*3/uL — ABNORMAL HIGH (ref 1.7–7.7)
Neutrophils Relative %: 87 %
Platelet Count: 323 10*3/uL (ref 150–400)
RBC: 2.63 MIL/uL — ABNORMAL LOW (ref 4.22–5.81)
RDW: 17.2 % — ABNORMAL HIGH (ref 11.5–15.5)
WBC Count: 13.9 10*3/uL — ABNORMAL HIGH (ref 4.0–10.5)
nRBC: 0 % (ref 0.0–0.2)

## 2020-07-29 LAB — CMP (CANCER CENTER ONLY)
ALT: 8 U/L (ref 0–44)
AST: 10 U/L — ABNORMAL LOW (ref 15–41)
Albumin: 3.8 g/dL (ref 3.5–5.0)
Alkaline Phosphatase: 58 U/L (ref 38–126)
Anion gap: 7 (ref 5–15)
BUN: 32 mg/dL — ABNORMAL HIGH (ref 8–23)
CO2: 26 mmol/L (ref 22–32)
Calcium: 9.4 mg/dL (ref 8.9–10.3)
Chloride: 103 mmol/L (ref 98–111)
Creatinine: 1.2 mg/dL (ref 0.61–1.24)
GFR, Estimated: >60 mL/min (ref >60)
Glucose, Bld: 88 mg/dL (ref 70–99)
Potassium: 4.3 mmol/L (ref 3.5–5.1)
Sodium: 136 mmol/L (ref 135–145)
Total Bilirubin: 0.2 mg/dL — ABNORMAL LOW (ref 0.3–1.2)
Total Protein: 6.7 g/dL (ref 6.5–8.1)

## 2020-07-29 LAB — IRON AND TIBC
Iron: 49 ug/dL (ref 42–163)
Saturation Ratios: 21 % (ref 20–55)
TIBC: 240 ug/dL (ref 202–409)
UIBC: 191 ug/dL (ref 117–376)

## 2020-07-29 LAB — FERRITIN: Ferritin: 161 ng/mL (ref 24–336)

## 2020-07-29 LAB — LACTATE DEHYDROGENASE: LDH: 149 U/L (ref 98–192)

## 2020-07-29 MED ORDER — SODIUM CHLORIDE 0.9% FLUSH
10.0000 mL | INTRAVENOUS | Status: DC | PRN
  Administered 2020-07-29: 10 mL via INTRAVENOUS
  Filled 2020-07-29: qty 10

## 2020-07-29 MED ORDER — APIXABAN 5 MG PO TABS: 5.0000 mg | ORAL_TABLET | Freq: Two times a day (BID) | ORAL | 5 refills | Status: DC

## 2020-07-29 MED ORDER — HEPARIN SOD (PORK) LOCK FLUSH 100 UNIT/ML IV SOLN
500.0000 [IU] | Freq: Once | INTRAVENOUS | Status: DC
  Administered 2020-07-29: 500 [IU] via INTRAVENOUS
  Filled 2020-07-29: qty 5

## 2020-07-29 MED FILL — ELIQUIS 5 MG TABLET: 5 | 30 days supply | Qty: 60 | Fill #0

## 2020-07-29 NOTE — Patient Instructions (Signed)

## 2020-07-29 NOTE — Progress Notes (Signed)
DISCONTINUE OFF PATHWAY REGIMEN - Bladder   [id number]:Dose-dense MVAC (split-dose cisplatin) q14 days:   A cycle is every 14 days:     Methotrexate      Vinblastine      Doxorubicin      Cisplatin      [id number]   **Always confirm dose/schedule in your pharmacy ordering system**  REASON: Toxicities / Adverse Event PRIOR TREATMENT: Off Pathway: Dose-dense MVAC (split-dose cisplatin) q14 days TREATMENT RESPONSE: Partial Response (PR)  START OFF PATHWAY REGIMEN - Bladder   [id number]:Gemcitabine + Cisplatin every 21 days:   A cycle is every 21 days:     Gemcitabine      Cisplatin   **Always confirm dose/schedule in your pharmacy ordering system**  Patient Characteristics: Advanced/Metastatic Disease, Second Line, FGFR2/FGFR3 Mutation Negative or Unknown, Prior Platinum-Based Therapy and No Prior PD-1/PD-L1 Inhibitor Therapeutic Status: Advanced/Metastatic Disease Line of Therapy: Second Line FGFR2/FGFR3 Mutation Status: Negative Intent of Therapy: Non-Curative / Palliative Intent, Discussed with Patient

## 2020-07-29 NOTE — Progress Notes (Signed)
Patient to start a new treatment regimen next week.   Oncology Nurse Navigator Documentation  Oncology Nurse Navigator Flowsheets 07/29/2020  Abnormal Finding Date -  Confirmed Diagnosis Date -  Diagnosis Status -  Planned Course of Treatment -  Phase of Treatment -  Chemotherapy Pending- Reason: -  Chemo/Radiation Concurrent Actual Start Date: -  Navigator Follow Up Date: 08/05/2020  Navigator Follow Up Reason: Chemotherapy  Navigator Location [facility]  Referral Date to RadOnc/MedOnc -  Navigator Encounter Type Treatment  Telephone -  Treatment Initiated Date -  Patient Visit Type MedOnc  Treatment Phase Active Tx  Barriers/Navigation Needs Coordination of Care;Education  Education -  Interventions Psycho-Social Support  Acuity Level 2-Minimal Needs (1-2 Barriers Identified)  Referrals -  Coordination of Care -  Education Method -  Support Groups/Services Friends and Family  Time Spent with Patient 30

## 2020-07-29 NOTE — Progress Notes (Signed)
Pharmacist Chemotherapy Monitoring - Initial Assessment    Anticipated start date: 08/05/20  Regimen:  . Are orders appropriate based on the patient's diagnosis, regimen, and cycle? Yes . Does the plan date match the patient's scheduled date? Yes . Is the sequencing of drugs appropriate? Yes  . Are the premedications appropriate for the patient's regimen? Yes . Prior Authorization for treatment is: Not Started o If applicable, is the correct biosimilar selected based on the patient's insurance? not applicable  Organ Function and Labs: Marland Kitchen Are dose adjustments needed based on the patient's renal function, hepatic function, or hematologic function? No . Are appropriate labs ordered prior to the start of patient's treatment? Yes . Other organ system assessment, if indicated: cisplatin: baseline audiogram . The following baseline labs, if indicated, have been ordered: cisplatin: K, Mg  Dose Assessment: . Are the drug doses appropriate? Yes . Are the following correct: o Drug concentrations Yes o IV fluid compatible with drug Yes o Administration routes Yes o Timing of therapy Yes . If applicable, does the patient have documented access for treatment and/or plans for port-a-cath placement? yes . If applicable, have lifetime cumulative doses been properly documented and assessed? not applicable Lifetime Dose Tracking  . Doxorubicin: 87.507 mg/m2 (168 mg) = 19.45 % of the maximum lifetime dose of 450 mg/m2  o   Toxicity Monitoring/Prevention: . The patient has the following take home antiemetics prescribed: Ondansetron, Prochlorperazine, Dexamethasone and Lorazepam . The patient has the following take home medications prescribed: N/A . Medication allergies and previous infusion related reactions, if applicable, have been reviewed and addressed. Yes . The patient's current medication list has been assessed for drug-drug interactions with their chemotherapy regimen. no significant drug-drug  interactions were identified on review.  Order Review: . Are the treatment plan orders signed? Yes . Is the patient scheduled to see a provider prior to their treatment? Yes  I verify that I have reviewed each item in the above checklist and answered each question accordingly.  [name], [facility], 07/29/2020  12:34 PM

## 2020-07-29 NOTE — Progress Notes (Signed)
Hematology and Oncology Follow Up Visit  Brian Benton [id number] 01/14/1956 65 y.o. 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 twice daily  Cis-platinum/Gemzar-start cycle 1 on 08/05/2020     Interim History:  Brian Benton is back for a follow-up.  We recently did scans on him.  The scans are somewhat divergent.  We did the MRI.  The MRI showed that there is some regression in the disease centered to the left retroperitoneum and involving the lumbosacral junction.  We then got a PET scan on him.  The PET scan showed everything to be relatively stable from my point of view.  The tumor measures 2.8 x 2.2 x 4.6 cm.  It has an SUV that is somewhat increased to 12.2.  It is possible that the increase in SUV could be reactionary from his chemotherapy.  I think the problem is that his chemotherapy is really caused him a lot of issues.  He is having a hard time with chemotherapy.  I think were going to have to make a change.  I think it would be reasonable to try him on cis-platinum with Gemzar.  I think this would be a good option for him.  He does have a high TMB with respect to his cancer.  As such, we might be able to add immunotherapy.  I know this might be somewhat controversial.  He did see the vascular radiologist.  Everything looked fine with respect to the blood clot in his left leg.  As such, we will switch him from Lovenox over to Eliquis.  I think this would be helpful for his quality of life.  We will put him on Eliquis at 5 mg p.o. twice daily.  He has had a decent appetite.  He is trying to exercise.  He is walking when  the weather gets warmer.  He does have a compression stocking on his left leg.  He has had no bleeding.  He has had no diarrhea.  He has had no cough.  He has had no fever.  Overall, his performance status is ECOG 1.    Medications:  Current Outpatient Medications:  .  ASPIRIN ADULT LOW STRENGTH 81 MG EC tablet, TAKE 1 TABLET BY MOUTH EVERY DAY (Patient taking differently: Take 81 mg by mouth daily.), Disp: 30 tablet, Rfl: 11 .  Calcium Carbonate-Vit D-Min (CALTRATE PLUS PO), Take 1 tablet by mouth daily., Disp: , Rfl:  .  dexamethasone (DECADRON) 4 MG tablet, TAKE TWO TABLETS (8 MG TOTAL) BY MOUTH DAILY WITH FOOD FOR THREE DAYS STARTING THE DAY AFTER CISPLATIN CHEMOTHERAPY., Disp: 30 tablet, Rfl: 1 .  enoxaparin (LOVENOX) 80 MG/0.8ML injection, Inject 0.8 mLs (80 mg total) into the skin every 12 (twelve) hours., Disp: 19.2 mL, Rfl: 6 .  fentaNYL (DURAGESIC) 50 MCG/HR, PLACE 1 PATCH ONTO THE SKIN EVERY 3 (THREE) DAYS., Disp: 10 patch, Rfl: 0 .  gabapentin (NEURONTIN) 600 MG tablet, Take 1 tablet (600 mg total) by mouth in the morning, at noon, in the evening, and at bedtime. (Patient not taking: No sig reported), Disp: 120 tablet, Rfl: 3 .  Lactulose 20 GM/30ML SOLN,  Take 30 mLs (20 g total) by mouth every 4 (four) hours as needed. (Patient not taking: Reported on 07/08/2020), Disp: 450 mL, Rfl: 2 .  lidocaine-prilocaine (EMLA) cream, Apply to affected area once (Patient taking differently: Apply 1 application topically once. Apply to affected area once), Disp: 30 g, Rfl: 3 .  LORazepam (ATIVAN) 0.5 MG tablet, TAKE 1 TABLET (0.5 MG TOTAL) BY MOUTH EVERY 6 (SIX) HOURS AS NEEDED FOR ANXIETY., Disp: 90 tablet, Rfl: 1 .  meloxicam (MOBIC) 15 MG tablet, Take 1 tablet (15 mg total) by mouth daily. (Patient not taking: No sig reported), Disp: 30 tablet, Rfl: 3 .  Multiple Vitamins-Minerals (MULTIVITAMIN WITH MINERALS) tablet, Take 1 tablet by mouth daily., Disp: , Rfl:  .  ondansetron (ZOFRAN ODT) 8 MG  disintegrating tablet, Take 1 tablet (8 mg total) by mouth every 8 (eight) hours as needed for nausea or vomiting., Disp: 30 tablet, Rfl: 0 .  Oxycodone HCl 10 MG TABS, TAKE 1 TABLET (10 MG TOTAL) BY MOUTH EVERY 4 (FOUR) HOURS AS NEEDED., Disp: 90 tablet, Rfl: 0 .  pantoprazole (PROTONIX) 40 MG tablet, Take 40 mg by mouth daily. (Patient not taking: Reported on 07/08/2020), Disp: , Rfl:  .  sodium chloride (OCEAN) 0.65 % SOLN nasal spray, Place 1 spray into both nostrils daily as needed for congestion (Allergies)., Disp: , Rfl:  .  zolpidem (AMBIEN) 10 MG tablet, TAKE 1 TABLET (10 MG TOTAL) BY MOUTH AT BEDTIME AS NEEDED FOR SLEEP., Disp: 30 tablet, Rfl: 0  Allergies: No Known Allergies  Past Medical History, Surgical history, Social history, and Family History were reviewed and updated.  Review of Systems: Review of Systems  Constitutional: Negative.   HENT:  Negative.   Eyes: Negative.   Respiratory: Negative.   Cardiovascular: Positive for leg swelling.  Gastrointestinal: Negative.   Endocrine: Negative.   Musculoskeletal: Positive for flank pain.  Skin: Negative.   Neurological: Negative.   Hematological: Negative.   Psychiatric/Behavioral: Negative.     Physical Exam:  vitals were not taken for this visit.   Wt Readings from Last 3 Encounters:  07/08/20 160 lb 1.3 oz (72.6 kg)  06/24/20 156 lb 3.2 oz (70.9 kg)  06/17/20 165 lb (74.8 kg)    Physical Exam Vitals reviewed.  HENT:     Head: Normocephalic and atraumatic.  Eyes:     Pupils: Pupils are equal, round, and reactive to light.  Cardiovascular:     Rate and Rhythm: Normal rate and regular rhythm.     Heart sounds: Normal heart sounds.  Pulmonary:     Effort: Pulmonary effort is normal.     Breath sounds: Normal breath sounds.  Abdominal:     General: Bowel sounds are normal.     Palpations: Abdomen is soft.  Musculoskeletal:        General: No tenderness or deformity. Normal range of motion.     Cervical  back: Normal range of motion.     Comments: Extremities shows minimal swelling of the left leg.  He has good range of motion of the leg.  He has good pulses in his distal extremities.  The right leg is unremarkable.  Lymphadenopathy:     Cervical: No cervical adenopathy.  Skin:    General: Skin is warm and dry.     Findings: No erythema or rash.  Neurological:     Mental Status: He is alert and oriented to person, place, and time.  Psychiatric:  Behavior: Behavior normal.        Thought Content: Thought content normal.        Judgment: Judgment normal.    Lab Results  Component Value Date   WBC 13.2 (H) 07/08/2020   HGB 9.5 (L) 07/08/2020   HCT 28.5 (L) 07/08/2020   MCV 101.4 (H) 07/08/2020   PLT 188 07/08/2020     Chemistry      Component Value Date/Time   NA 137 07/08/2020 0800   K 4.3 07/08/2020 0800   CL 103 07/08/2020 0800   CO2 26 07/08/2020 0800   BUN 17 07/08/2020 0800   CREATININE 1.07 07/08/2020 0800      Component Value Date/Time   CALCIUM 9.7 07/08/2020 0800   ALKPHOS 70 07/08/2020 0800   AST 10 (L) 07/08/2020 0800   ALT 10 07/08/2020 0800   BILITOT 0.2 (L) 07/08/2020 0800      Impression and Plan: Mr. Kostelnik is a really nice 65 year old white male.  He has a very interesting problem.  He had in situ bladder cancer about 5 years ago.  Now, he has a recurrence that I would consider metastatic.  This is outside of the bladder.  It is not operable as it is appear to be invading to the psoas muscle and also involving his lower spine.  He completed initial radiation and chemotherapy.  He did very well with this.  He had a very nice response by PET scan and by MRI.  We are going to have to make a change with his systemic therapy protocol.  He just is having a lot of toxicity.  I just hate that his quality of life is affected this way.  Again, we will try cis-platinum/Gemzar.  I think this would be very reasonable and should be active.  Whether or not we  can add immunotherapy is another question.  We will see about treatment to start next week.  I talked to him about this.  Again I think that there should be tolerable.  I do not think that it will affect his blood counts all that much.  He has decent renal function.  I will plan to see him back when he starts his second cycle of treatment in March.  I will give him 3 cycles and then we will repeat our scans.     Volanda Napoleon, MD 2/16/20228:34 AM

## 2020-07-30 ENCOUNTER — Inpatient Hospital Stay: Payer: 59

## 2020-07-31 ENCOUNTER — Other Ambulatory Visit: Payer: Self-pay | Admitting: *Deleted

## 2020-07-31 ENCOUNTER — Other Ambulatory Visit: Payer: Self-pay | Admitting: Hematology & Oncology

## 2020-07-31 ENCOUNTER — Inpatient Hospital Stay: Payer: 59

## 2020-07-31 ENCOUNTER — Encounter: Payer: Self-pay | Admitting: *Deleted

## 2020-07-31 ENCOUNTER — Ambulatory Visit: Payer: 59 | Admitting: Physical Therapy

## 2020-07-31 DIAGNOSIS — C799 Secondary malignant neoplasm of unspecified site: Secondary | ICD-10-CM

## 2020-07-31 DIAGNOSIS — C679 Malignant neoplasm of bladder, unspecified: Secondary | ICD-10-CM

## 2020-07-31 MED ORDER — PROCHLORPERAZINE MALEATE 10 MG PO TABS: 10.0000 mg | ORAL_TABLET | Freq: Four times a day (QID) | ORAL | 1 refills | Status: DC | PRN

## 2020-08-05 ENCOUNTER — Other Ambulatory Visit: Payer: Self-pay | Admitting: Hematology & Oncology

## 2020-08-05 ENCOUNTER — Inpatient Hospital Stay: Payer: 59

## 2020-08-05 ENCOUNTER — Other Ambulatory Visit: Payer: Self-pay

## 2020-08-05 ENCOUNTER — Encounter: Payer: Self-pay | Admitting: *Deleted

## 2020-08-05 VITALS — BP 106/60 | HR 70 | Temp 98.7°F | Resp 17

## 2020-08-05 DIAGNOSIS — C679 Malignant neoplasm of bladder, unspecified: Secondary | ICD-10-CM

## 2020-08-05 DIAGNOSIS — Z5111 Encounter for antineoplastic chemotherapy: Secondary | ICD-10-CM | POA: Diagnosis not present

## 2020-08-05 LAB — CBC WITH DIFFERENTIAL (CANCER CENTER ONLY)
Abs Immature Granulocytes: 0.06 10*3/uL (ref 0.00–0.07)
Basophils Absolute: 0.1 10*3/uL (ref 0.0–0.1)
Basophils Relative: 1 %
Eosinophils Absolute: 0.2 10*3/uL (ref 0.0–0.5)
Eosinophils Relative: 1 %
HCT: 29.1 % — ABNORMAL LOW (ref 39.0–52.0)
Hemoglobin: 9.8 g/dL — ABNORMAL LOW (ref 13.0–17.0)
Immature Granulocytes: 1 %
Lymphocytes Relative: 6 %
Lymphs Abs: 0.6 10*3/uL — ABNORMAL LOW (ref 0.7–4.0)
MCH: 33.7 pg (ref 26.0–34.0)
MCHC: 33.7 g/dL (ref 30.0–36.0)
MCV: 100 fL (ref 80.0–100.0)
Monocytes Absolute: 0.9 10*3/uL (ref 0.1–1.0)
Monocytes Relative: 9 %
Neutro Abs: 9.1 10*3/uL — ABNORMAL HIGH (ref 1.7–7.7)
Neutrophils Relative %: 82 %
Platelet Count: 348 10*3/uL (ref 150–400)
RBC: 2.91 MIL/uL — ABNORMAL LOW (ref 4.22–5.81)
RDW: 16.7 % — ABNORMAL HIGH (ref 11.5–15.5)
WBC Count: 10.9 10*3/uL — ABNORMAL HIGH (ref 4.0–10.5)
nRBC: 0 % (ref 0.0–0.2)

## 2020-08-05 LAB — CMP (CANCER CENTER ONLY)
ALT: 8 U/L (ref 0–44)
AST: 11 U/L — ABNORMAL LOW (ref 15–41)
Albumin: 4.1 g/dL (ref 3.5–5.0)
Alkaline Phosphatase: 63 U/L (ref 38–126)
Anion gap: 7 (ref 5–15)
BUN: 35 mg/dL — ABNORMAL HIGH (ref 8–23)
CO2: 27 mmol/L (ref 22–32)
Calcium: 10.1 mg/dL (ref 8.9–10.3)
Chloride: 102 mmol/L (ref 98–111)
Creatinine: 1.27 mg/dL — ABNORMAL HIGH (ref 0.61–1.24)
GFR, Estimated: >60 mL/min (ref >60)
Glucose, Bld: 102 mg/dL — ABNORMAL HIGH (ref 70–99)
Potassium: 4.6 mmol/L (ref 3.5–5.1)
Sodium: 136 mmol/L (ref 135–145)
Total Bilirubin: 0.3 mg/dL (ref 0.3–1.2)
Total Protein: 7.3 g/dL (ref 6.5–8.1)

## 2020-08-05 LAB — MAGNESIUM: Magnesium: 2 mg/dL (ref 1.7–2.4)

## 2020-08-05 MED ORDER — SODIUM CHLORIDE 0.9 % IV SOLN
150.0000 mg | Freq: Once | INTRAVENOUS | Status: AC
  Administered 2020-08-05: 150 mg via INTRAVENOUS
  Filled 2020-08-05: qty 150

## 2020-08-05 MED ORDER — PALONOSETRON HCL INJECTION 0.25 MG/5ML
INTRAVENOUS | Status: AC
  Filled 2020-08-05: qty 5

## 2020-08-05 MED ORDER — DEXAMETHASONE SODIUM PHOSPHATE 100 MG/10ML IJ SOLN
10.0000 mg | Freq: Once | INTRAMUSCULAR | Status: AC
  Administered 2020-08-05: 10 mg via INTRAVENOUS
  Filled 2020-08-05: qty 10

## 2020-08-05 MED ORDER — MAGNESIUM SULFATE 2 GM/50ML IV SOLN
2.0000 g | Freq: Once | INTRAVENOUS | Status: AC
  Administered 2020-08-05: 2 g via INTRAVENOUS
  Filled 2020-08-05: qty 50

## 2020-08-05 MED ORDER — HEPARIN SOD (PORK) LOCK FLUSH 100 UNIT/ML IV SOLN
500.0000 [IU] | Freq: Once | INTRAVENOUS | Status: AC | PRN
  Administered 2020-08-05: 500 [IU]
  Filled 2020-08-05: qty 5

## 2020-08-05 MED ORDER — SODIUM CHLORIDE 0.9 % IV SOLN
2000.0000 mg | Freq: Once | INTRAVENOUS | Status: AC
  Administered 2020-08-05: 2000 mg via INTRAVENOUS
  Filled 2020-08-05: qty 52.6

## 2020-08-05 MED ORDER — PALONOSETRON HCL INJECTION 0.25 MG/5ML
0.2500 mg | Freq: Once | INTRAVENOUS | Status: AC
  Administered 2020-08-05: 0.25 mg via INTRAVENOUS

## 2020-08-05 MED ORDER — POTASSIUM CHLORIDE IN NACL 20-0.9 MEQ/L-% IV SOLN
Freq: Once | INTRAVENOUS | Status: AC
  Filled 2020-08-05: qty 1000

## 2020-08-05 MED ORDER — SODIUM CHLORIDE 0.9 % IV SOLN
Freq: Once | INTRAVENOUS | Status: AC
  Filled 2020-08-05: qty 250

## 2020-08-05 MED ORDER — SODIUM CHLORIDE 0.9 % IV SOLN
70.0000 mg/m2 | Freq: Once | INTRAVENOUS | Status: AC
  Administered 2020-08-05: 134 mg via INTRAVENOUS
  Filled 2020-08-05: qty 134

## 2020-08-05 MED ORDER — SODIUM CHLORIDE 0.9% FLUSH
10.0000 mL | INTRAVENOUS | Status: DC | PRN
Start: 2020-08-05 — End: 2020-08-05
  Administered 2020-08-05: 10 mL
  Filled 2020-08-05: qty 10

## 2020-08-05 MED FILL — fentaNYL 50 MCG/HR PT72: 50 | 30 days supply | Qty: 10 | Fill #0

## 2020-08-05 NOTE — Progress Notes (Signed)
Per Dr. Marin Olp; ok to run post hydration fluids with cisplatin infusion.

## 2020-08-05 NOTE — Patient Instructions (Signed)
[facility] Discharge Instructions for Patients Receiving Chemotherapy  Today you received the following chemotherapy agents Cisplatin and Gemzar  To help prevent nausea and vomiting after your treatment, we encourage you to take your nausea medication as prescribed by MD. **DO NOT TAKE ZOFRAN FOR 3 DAYS AFTER CHEMOTHERAPY**   If you develop nausea and vomiting that is not controlled by your nausea medication, call the clinic.   BELOW ARE SYMPTOMS THAT SHOULD BE REPORTED IMMEDIATELY:  *FEVER GREATER THAN 100.5 F  *CHILLS WITH OR WITHOUT FEVER  NAUSEA AND VOMITING THAT IS NOT CONTROLLED WITH YOUR NAUSEA MEDICATION  *UNUSUAL SHORTNESS OF BREATH  *UNUSUAL BRUISING OR BLEEDING  TENDERNESS IN MOUTH AND THROAT WITH OR WITHOUT PRESENCE OF ULCERS  *URINARY PROBLEMS  *BOWEL PROBLEMS  UNUSUAL RASH Items with * indicate a potential emergency and should be followed up as soon as possible.  Feel free to call the clinic should you have any questions or concerns. The clinic phone number is [phone].  Please show the [facility] at check-in to the Emergency Department and triage nurse.

## 2020-08-05 NOTE — Progress Notes (Signed)
Patient is here to start a new treatment plan.   Oncology Nurse Navigator Documentation  Oncology Nurse Navigator Flowsheets 08/05/2020  Abnormal Finding Date -  Confirmed Diagnosis Date -  Diagnosis Status -  Planned Course of Treatment -  Phase of Treatment -  Chemotherapy Pending- Reason: -  Chemo/Radiation Concurrent Actual Start Date: -  Navigator Follow Up Date: 08/26/2020  Navigator Follow Up Reason: Follow-up Appointment;Chemotherapy  Navigator Location [facility]  Referral Date to RadOnc/MedOnc -  Navigator Encounter Type Treatment  Telephone -  Treatment Initiated Date -  Patient Visit Type MedOnc  Treatment Phase Active Tx  Barriers/Navigation Needs Coordination of Care;Education  Education -  Interventions Psycho-Social Support  Acuity Level 2-Minimal Needs (1-2 Barriers Identified)  Referrals -  Coordination of Care -  Education Method -  Support Groups/Services Friends and Family  Time Spent with Patient 30

## 2020-08-06 ENCOUNTER — Ambulatory Visit: Payer: 59 | Attending: Interventional Radiology | Admitting: Physical Therapy

## 2020-08-06 DIAGNOSIS — I890 Lymphedema, not elsewhere classified: Secondary | ICD-10-CM | POA: Insufficient documentation

## 2020-08-06 DIAGNOSIS — R262 Difficulty in walking, not elsewhere classified: Secondary | ICD-10-CM | POA: Insufficient documentation

## 2020-08-06 DIAGNOSIS — R29898 Other symptoms and signs involving the musculoskeletal system: Secondary | ICD-10-CM | POA: Diagnosis not present

## 2020-08-06 MED FILL — ZOLPIDEM TARTRATE 10 MG TAB: 10 | 30 days supply | Qty: 30 | Fill #0

## 2020-08-06 NOTE — Therapy (Signed)
[place], [place], [address] Phone: [phone]   Fax:  [phone]  Physical Therapy Evaluation  Patient Details  Name: Brian Benton MRN: [id number] Date of Birth: [birth year] Referring Provider (PT): Dr. Earleen Newport   Encounter Date: 08/06/2020   PT End of Session - 08/06/20 1732    Visit Number 1    Number of Visits 9    Date for PT Re-Evaluation 10/05/20    PT Start Time 1600    PT Stop Time 1705    PT Time Calculation (min) 65 min    Activity Tolerance Patient tolerated treatment well    Behavior During Therapy [facility] for tasks assessed/performed           Past Medical History:  Diagnosis Date  . Cancer ([place])    bladder  . History of bladder cancer    s/p  TURBT 07-25-2014  . Hyperlipidemia   . Mass of left hand   . 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 BLADDER SURGERY     [place]  . COLONOSCOPY    . CYSTECTOMY    . CYSTOSCOPY WITH BIOPSY N/A 07/25/2014   Procedure: CYSTOSCOPY WITH BLADDER BIOPSY AND FULGERATION;  Surgeon: [name], MD;  Location: [facility];  Service: Urology;  Laterality: N/A;  . CYSTOSCOPY WITH BIOPSY N/A 11/28/2014   Procedure: CYSTOSCOPY WITH  BLADDER BIOPSY;  Surgeon: [name], MD;  Location: [facility];  Service: Urology;  Laterality: N/A;  . HEMORRHOID SURGERY  03/15/2012   Procedure: HEMORRHOIDECTOMY;  Surgeon: [name], MD;  Location: [facility];  Service: General;  Laterality: N/A;  . HERNIA REPAIR    . INGUINAL HERNIA REPAIR Bilateral 2006  . INTRAVASCULAR ULTRASOUND/IVUS N/A 03/13/2020   Procedure: Intravascular Ultrasound/IVUS;  Surgeon: Elam Dutch, MD;  [facility]: [facility];  Service: Cardiovascular;  Laterality: N/A;  . IR IMAGING GUIDED PORT INSERTION  06/01/2020  . IR INTRAVASCULAR ULTRASOUND NON CORONARY  06/08/2020  .  IR INTRAVASCULAR ULTRASOUND NON CORONARY  06/17/2020  . IR IVC FILTER PLMT / S&I /IMG GUID/MOD SED  06/08/2020  . IR PTA VENOUS EXCEPT DIALYSIS CIRCUIT  06/08/2020  . IR RADIOLOGIST EVAL & MGMT  04/30/2020  . IR RADIOLOGIST EVAL & MGMT  05/27/2020  . IR RADIOLOGIST EVAL & MGMT  07/21/2020  . IR TRANSCATH PLC STENT  INITIAL VEIN  INC ANGIOPLASTY  06/17/2020  . IR TRANSCATH PLC STENT 1ST ART NOT LE CV CAR VERT CAR  06/08/2020  . IR US GUIDE VASC ACCESS LEFT  06/08/2020  . IR US GUIDE VASC ACCESS LEFT  06/17/2020  . IR VENO/EXT/UNI LEFT  06/08/2020  . IR VENO/EXT/UNI LEFT  06/17/2020  . IVC VENOGRAPHY N/A 03/13/2020   Procedure: IVC Venography;  Surgeon: Elam Dutch, MD;  Location: [place] [facility];  Service: Cardiovascular;  Laterality: N/A;  . LOWER EXTREMITY VENOGRAPHY Left 03/13/2020   Procedure: LOWER EXTREMITY VENOGRAPHY;  Surgeon: Elam Dutch, MD;  Location: [place] [facility];  Service: Cardiovascular;  Laterality: Left;  Marland Kitchen MASS EXCISION Left 11/12/2018   Procedure: EXCISION MASS LEFT HAND;  Surgeon: Daryll Brod, MD;  Location: [facility];  Service: Orthopedics;  Laterality: Left;  FAB  . PERIPHERAL VASCULAR INTERVENTION  03/13/2020   Procedure: PERIPHERAL VASCULAR INTERVENTION;  Surgeon: Elam Dutch, MD;  Location: [place] [facility];  Service: Cardiovascular;;  . PERIPHERAL VASCULAR THROMBECTOMY N/A 03/13/2020   Procedure: PERIPHERAL VASCULAR THROMBECTOMY;  Surgeon: Elam Dutch, MD;  Location: [place] [facility];  Service: Cardiovascular;  Laterality: N/A;  . RADIOLOGY WITH ANESTHESIA N/A 02/20/2020   Procedure: MRI LUMBAR W/O CONTRAST  WITH ANESTHESIA;  Surgeon: Radiologist, Medication, MD;  Location: [facility];  Service: Radiology;  Laterality: N/A;  . RADIOLOGY WITH ANESTHESIA N/A 06/08/2020   Procedure: IR WITH ANESTHESIA VENOGRAM;  Surgeon: Radiologist, Medication, MD;  Location: [place];  Service: Radiology;  Laterality: N/A;  . TESTICLE SURGERY  2004    Ruptured Undescended Right testicle   . TRANSURETHRAL RESECTION OF BLADDER TUMOR WITH GYRUS (TURBT-GYRUS) N/A 05/23/2014   Procedure: TRANSURETHRAL RESECTION OF BLADDER TUMOR WITH GYRUS (TURBT-GYRUS);  Surgeon: [name], MD;  Location: [facility];  Service: Urology;  Laterality: N/A;  . WISDOM TOOTH EXTRACTION      There were no vitals filed for this visit.    Subjective Assessment - 08/06/20 1615    Subjective Pt has greatly improved his mobility since Nov 2021 when he was pretty much bedridden to the point that he can now walk with a cane.  He is still having pain and tightness in his left leg The swelling in his leg is improved.  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 He started chemo and radiation and had treatment to help stent the blood clot and circulation is improved. He wears wears thigh high and below knee compression hose    Patient Stated Goals to get left leg stronger and to get rid of the leg pain    Currently in Pain? Yes    Pain Score 4     Pain Location Leg    Pain Orientation Left    Pain Descriptors / Indicators --   stiffness   Pain Type Chronic pain    Pain Radiating Towards pain to middle of the left thigh sometimes to knee or calf    Pain Onset More than a month ago    Pain Frequency Constant    Aggravating Factors  pain and swelling increases with chemo    Pain Relieving Factors stretch    Effect of Pain on Daily Activities can't do what he wants to do .Marland KitchenMarland Kitchenpt has always been an avid exerciser including yoga, Ambrose Pancoast do              [facility] PT Assessment - 08/06/20  0001      Assessment   Medical Diagnosis bladder cancer    Referring Provider (PT) Dr. Earleen Newport    Onset Date/Surgical Date 12/12/19   approximate time   Hand Dominance Left      Balance Screen   Has the patient fallen in the past 6 months No    Has the patient had a decrease in activity level because of a fear of falling?  No    Is the patient reluctant to leave their home because of a fear of falling?  No      Home Ecologist  residence    Living Arrangements Spouse/significant other    Available Help at Discharge Available 24 hours/day      Prior Function   Level of Independence Independent    Vocation Full time employment    Dispensing optician, working from home    Leisure has a gym at home with [employer] and [employer], full barballs uses beachbody app with functional strength      Cognition   Overall Cognitive Status Within Functional Limits for tasks assessed      Observation/Other Assessments   Observations pt comes in carrying a cane that he says he mostly uses when he walks outise      Sensation   Light Touch Appears Intact      Coordination   Gross Motor Movements are Fluid and Coordinated No   weakness in left leg apparent     Functional Tests   Functional tests Sit to Stand      Sit to Stand   Comments 10 repetitions in 30 seconds  with report that activity "loosened" his left thigh      Posture/Postural Control   Posture/Postural Control No significant limitations      ROM / Strength   AROM / PROM / Strength AROM;Strength      AROM   Overall AROM  Within functional limits for tasks performed    Overall AROM Comments bilateral hamsting tightness pt states he has tightness in his left thigh also and it moves around his leg      Strength   Overall Strength Deficits    Overall Strength Comments weakness and atrophy in left leg functionally, pt is unable to bridge on left leg    Strength Assessment Site  Hip;Knee;Ankle    Right/Left hand Right;Left    Right Hand Grip (lbs) 95    Left Hand Grip (lbs) 100    Right Hip Flexion 5/5    Right Hip Extension 5/5    Right Hip ABduction 2+/5    Right Hip ADduction 0/5    Left Hip Flexion 2+/5    Left Hip Extension 2+/5    Left Hip ABduction 2+/5    Left Hip ADduction 1/5    Right Knee Flexion 5/5    Right Knee Extension 5/5    Left Knee Flexion 2+/5    Left Knee Extension 3+/5      Palpation   Palpation comment pt with muscle tightness in left thigh      Standardized Balance Assessment   Standardized Balance Assessment Timed Up and Go Test;10 meter walk test    10 Meter Walk 5 meter walk test 6.83 seconds for .73 m/sec      Timed Up and Go Test   TUG Normal TUG    Normal TUG (seconds) 10.9   normal for age is 7.9 +/- 0.9 sec     High Level Balance   High Level Balance Comments pt cannot do tandem stance of single leg stance on left leg             LYMPHEDEMA/ONCOLOGY QUESTIONNAIRE - 08/06/20 0001      Type   Cancer Type 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  with DVT      Treatment   Active Chemotherapy Treatment Yes      Lymphedema Assessments   Lymphedema Assessments Lower extremities      Right Lower Extremity Lymphedema   20 cm Proximal to Suprapatella 55.5 cm    10 cm Proximal to Suprapatella  43 cm    At Midpatella/Popliteal Crease 37.5 cm    30 cm Proximal to Floor at Lateral Plantar Foot 32.5 cm    20 cm Proximal to Floor at Lateral Plantar Foot 32.5 1    10  cm Proximal to Floor at Lateral Malleoli 22.5 cm    5 cm Proximal to 1st MTP Joint 23 cm    Across MTP Joint 23 cm    Around Proximal Great Toe 7.5 cm      Left Lower Extremity Lymphedema   20 cm Proximal to Suprapatella 44.5 cm    10 cm Proximal to Suprapatella 42 cm    At Midpatella/Popliteal Crease 37.5 cm    30 cm Proximal to Floor at Lateral Plantar Foot 31.3 cm    20 cm Proximal to Floor at Lateral Plantar Foot 23.5 cm    10 cm Proximal to  Floor at Lateral Malleoli 24 cm    5 cm Proximal to 1st MTP Joint 25 cm    Around Proximal Great Toe 8.5 cm                   Objective measurements completed on examination: See above findings.                    PT Long Term Goals - 08/06/20 1816      PT LONG TERM GOAL #1   Title Pt will report the pain in his left leg is decrased by 75%    Time 8    Period Weeks    Status New      PT LONG TERM GOAL #2   Title Pt will increase the number of sit to stands in 30 seconds to > 14 indicating an improvment in LE functional strength    Baseline 10 on 08/06/2020    Time 8    Period Weeks    Status New      PT LONG TERM GOAL #3   Title Pt will decrease his TUG score to < 8 seconds showing an improvment in functional mobility and decrease risk of fall    Baseline 10.9 sec on 08/05/2020    Time 8    Period Weeks    Status New      PT LONG TERM GOAL #4   Title Pt will increase the strength of his right hip adduction to 2+/5    Baseline 1/5 on 08/06/2020    Time 8    Period Weeks    Status New      PT LONG TERM GOAL #5   Title Pt will be independent in a home exericise program focusing in strengthening the left leg and know how to progress it    Time 8    Period Weeks    Status New                  Plan - 08/06/20 1732    Clinical Impression Statement Pt is an avid exerciser who is undergoing treatment for a chronic metastic mass on his  left retroperitoneum  involving the iliopsoas, lumbar plexus, and L5/S1 vertebrae,  He is recovering from significant immobility and treatment for DVT to the point that he can ambulate without a cane and his lymphedema is controlled.  He does have significant weakness in proximal left hip  and thigh and has functional impairment with decreased gait speed ,30 second sit to stand and balance.  He is doing general resistive exercise at home  but will benefit from PT for more focused exercises for his leg muscles with soft  tissue work as needed and monitoring of his lymphedema    Personal Factors and Comorbidities Comorbidity 3+    Comorbidities lumbar metastatic lesion, chronic DVT, ongoing chemotherapy    Examination-Activity Limitations Locomotion Level;Stairs;Stand;Squat;Lift;Bend;Transfers    Stability/Clinical Decision Making Evolving/Moderate complexity    Clinical Decision Making Moderate    Rehab Potential Excellent    PT Frequency 1x / week    PT Duration 8 weeks    PT Treatment/Interventions ADLs/Self Care Home Management;Electrical Stimulation;Moist Heat;Gait training;Stair training;Functional mobility training;Therapeutic activities;Neuromuscular re-education;Balance training;Therapeutic exercise;Patient/family education;Manual lymph drainage    PT Next Visit Plan examine squat mechanics and work on more focused activation of left hip muscles, teach HEP with ball and theraband for facilitation  hamstring stretches  soft tissue work and manual stretching, later, consider estim if pt not able to get muscle activation           Patient will benefit from skilled therapeutic intervention in order to improve the following deficits and impairments:  Abnormal gait,Decreased balance,Decreased endurance,Decreased mobility,Difficulty walking,Increased muscle spasms,Decreased range of motion,Decreased knowledge of precautions,Decreased activity tolerance,Decreased strength,Increased fascial restricitons,Impaired flexibility,Pain  Visit Diagnosis: Weakness of left leg - Plan: PT plan of care cert/re-cert  Difficulty in walking, not elsewhere classified - Plan: PT plan of care cert/re-cert  Lymphedema, not elsewhere classified - Plan: PT plan of care cert/re-cert     Problem List Patient Active Problem List   Diagnosis Date Noted  . 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 Leg swelling 03/02/2020   . Paraspinal mass 03/02/2020  . Acute deep vein thrombosis (DVT) of left lower extremity ([place]) 03/02/2020  . Essential hypertension 10/31/2018  . 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 Mixed hyperlipidemia 10/03/2018  . NEVUS, ATYPICAL 10/23/2007  . Tobacco abuse 02/21/2007   Donato Heinz. Owens Shark PT  [place] [facility]o 08/06/2020, 6:23 PM  [place], [place], [address] Phone: [phone]   Fax:  [phone]  Name: DAEVION NAVARETTE MRN: [id number] Date of Birth: 02/13/1956

## 2020-08-11 ENCOUNTER — Telehealth: Payer: Self-pay | Admitting: *Deleted

## 2020-08-11 NOTE — Telephone Encounter (Signed)
Call received from patient stating that he would like to get a second opinion from [facility] and that his back and left leg pain is starting to slightly come back.  Dr. Marin Olp notified.  No new orders received at this time.  Pt notified that he will need to contact [facility] for second opinion.  Pt is appreciative of assistance and has no further questions at this time.

## 2020-08-12 ENCOUNTER — Inpatient Hospital Stay: Payer: 59

## 2020-08-12 ENCOUNTER — Other Ambulatory Visit: Payer: Self-pay

## 2020-08-12 ENCOUNTER — Inpatient Hospital Stay: Payer: 59 | Attending: Hematology & Oncology

## 2020-08-12 DIAGNOSIS — C679 Malignant neoplasm of bladder, unspecified: Secondary | ICD-10-CM | POA: Diagnosis present

## 2020-08-12 DIAGNOSIS — D6481 Anemia due to antineoplastic chemotherapy: Secondary | ICD-10-CM | POA: Diagnosis not present

## 2020-08-12 DIAGNOSIS — Z5111 Encounter for antineoplastic chemotherapy: Secondary | ICD-10-CM | POA: Insufficient documentation

## 2020-08-12 DIAGNOSIS — C799 Secondary malignant neoplasm of unspecified site: Secondary | ICD-10-CM | POA: Diagnosis not present

## 2020-08-12 LAB — CMP (CANCER CENTER ONLY)
ALT: 13 U/L (ref 0–44)
AST: 12 U/L — ABNORMAL LOW (ref 15–41)
Albumin: 3.9 g/dL (ref 3.5–5.0)
Alkaline Phosphatase: 49 U/L (ref 38–126)
Anion gap: 6 (ref 5–15)
BUN: 27 mg/dL — ABNORMAL HIGH (ref 8–23)
CO2: 27 mmol/L (ref 22–32)
Calcium: 9.4 mg/dL (ref 8.9–10.3)
Chloride: 102 mmol/L (ref 98–111)
Creatinine: 1.16 mg/dL (ref 0.61–1.24)
GFR, Estimated: >60 mL/min (ref >60)
Glucose, Bld: 101 mg/dL — ABNORMAL HIGH (ref 70–99)
Potassium: 4.6 mmol/L (ref 3.5–5.1)
Sodium: 135 mmol/L (ref 135–145)
Total Bilirubin: 0.3 mg/dL (ref 0.3–1.2)
Total Protein: 6.5 g/dL (ref 6.5–8.1)

## 2020-08-12 LAB — CBC WITH DIFFERENTIAL (CANCER CENTER ONLY)
Abs Immature Granulocytes: 0.01 10*3/uL (ref 0.00–0.07)
Basophils Absolute: 0 10*3/uL (ref 0.0–0.1)
Basophils Relative: 1 %
Eosinophils Absolute: 0.1 10*3/uL (ref 0.0–0.5)
Eosinophils Relative: 2 %
HCT: 26.8 % — ABNORMAL LOW (ref 39.0–52.0)
Hemoglobin: 8.9 g/dL — ABNORMAL LOW (ref 13.0–17.0)
Immature Granulocytes: 0 %
Lymphocytes Relative: 11 %
Lymphs Abs: 0.4 10*3/uL — ABNORMAL LOW (ref 0.7–4.0)
MCH: 32.8 pg (ref 26.0–34.0)
MCHC: 33.2 g/dL (ref 30.0–36.0)
MCV: 98.9 fL (ref 80.0–100.0)
Monocytes Absolute: 0.1 10*3/uL (ref 0.1–1.0)
Monocytes Relative: 3 %
Neutro Abs: 3.3 10*3/uL (ref 1.7–7.7)
Neutrophils Relative %: 83 %
Platelet Count: 165 10*3/uL (ref 150–400)
RBC: 2.71 MIL/uL — ABNORMAL LOW (ref 4.22–5.81)
RDW: 16.2 % — ABNORMAL HIGH (ref 11.5–15.5)
WBC Count: 4 10*3/uL (ref 4.0–10.5)
nRBC: 0 % (ref 0.0–0.2)

## 2020-08-12 LAB — MAGNESIUM: Magnesium: 1.7 mg/dL (ref 1.7–2.4)

## 2020-08-12 MED ORDER — PROCHLORPERAZINE MALEATE 10 MG PO TABS
ORAL_TABLET | ORAL | Status: AC
  Filled 2020-08-12: qty 1

## 2020-08-12 MED ORDER — COLD PACK MISC ONCOLOGY
1.0000 | Freq: Once | Status: DC | PRN
  Filled 2020-08-12: qty 1

## 2020-08-12 MED ORDER — SODIUM CHLORIDE 0.9% FLUSH
10.0000 mL | INTRAVENOUS | Status: DC | PRN
  Administered 2020-08-12: 10 mL
  Filled 2020-08-12: qty 10

## 2020-08-12 MED ORDER — PROCHLORPERAZINE MALEATE 10 MG PO TABS
10.0000 mg | ORAL_TABLET | Freq: Once | ORAL | Status: AC
Start: 2020-08-12 — End: 2020-08-12
  Administered 2020-08-12: 10 mg via ORAL

## 2020-08-12 MED ORDER — GEMCITABINE HCL CHEMO INJECTION 1 GM/26.3ML
2000.0000 mg | Freq: Once | INTRAVENOUS | Status: AC
  Administered 2020-08-12: 2000 mg via INTRAVENOUS
  Filled 2020-08-12: qty 52.6

## 2020-08-12 MED ORDER — HEPARIN SOD (PORK) LOCK FLUSH 100 UNIT/ML IV SOLN
500.0000 [IU] | Freq: Once | INTRAVENOUS | Status: AC | PRN
  Administered 2020-08-12: 500 [IU]
  Filled 2020-08-12: qty 5

## 2020-08-12 MED ORDER — SODIUM CHLORIDE 0.9 % IV SOLN
Freq: Once | INTRAVENOUS | Status: AC
  Filled 2020-08-12: qty 250

## 2020-08-12 NOTE — Patient Instructions (Signed)
[facility] Discharge Instructions for Patients Receiving Chemotherapy  Today you received the following chemotherapy agents Cisplatin and Gemzar  To help prevent nausea and vomiting after your treatment, we encourage you to take your nausea medication as prescribed by MD. **DO NOT TAKE ZOFRAN FOR 3 DAYS AFTER CHEMOTHERAPY**   If you develop nausea and vomiting that is not controlled by your nausea medication, call the clinic.   BELOW ARE SYMPTOMS THAT SHOULD BE REPORTED IMMEDIATELY:  *FEVER GREATER THAN 100.5 F  *CHILLS WITH OR WITHOUT FEVER  NAUSEA AND VOMITING THAT IS NOT CONTROLLED WITH YOUR NAUSEA MEDICATION  *UNUSUAL SHORTNESS OF BREATH  *UNUSUAL BRUISING OR BLEEDING  TENDERNESS IN MOUTH AND THROAT WITH OR WITHOUT PRESENCE OF ULCERS  *URINARY PROBLEMS  *BOWEL PROBLEMS  UNUSUAL RASH Items with * indicate a potential emergency and should be followed up as soon as possible.  Feel free to call the clinic should you have any questions or concerns. The clinic phone number is [phone].  Please show the [facility] at check-in to the Emergency Department and triage nurse.

## 2020-08-12 NOTE — Patient Instructions (Signed)

## 2020-08-14 ENCOUNTER — Ambulatory Visit: Payer: 59 | Attending: Interventional Radiology | Admitting: Physical Therapy

## 2020-08-14 ENCOUNTER — Other Ambulatory Visit: Payer: Self-pay

## 2020-08-14 ENCOUNTER — Encounter: Payer: Self-pay | Admitting: Physical Therapy

## 2020-08-14 DIAGNOSIS — R262 Difficulty in walking, not elsewhere classified: Secondary | ICD-10-CM | POA: Insufficient documentation

## 2020-08-14 DIAGNOSIS — R29898 Other symptoms and signs involving the musculoskeletal system: Secondary | ICD-10-CM | POA: Insufficient documentation

## 2020-08-14 DIAGNOSIS — I890 Lymphedema, not elsewhere classified: Secondary | ICD-10-CM | POA: Diagnosis present

## 2020-08-14 NOTE — Therapy (Signed)
[place], [place], [address] Phone: [phone]   Fax:  [phone]  Physical Therapy Treatment  Patient Details  Name: Brian Benton MRN: [id number] Date of Birth: [birth year] Referring Provider (PT): Dr. Earleen Newport   Encounter Date: 08/14/2020   PT End of Session - 08/14/20 0956    Visit Number 2    Number of Visits 9    Date for PT Re-Evaluation 10/05/20    PT Start Time 0904    PT Stop Time 0945    PT Time Calculation (min) 41 min    Activity Tolerance Patient tolerated treatment well    Behavior During Therapy [facility] for tasks assessed/performed           Past Medical History:  Diagnosis Date  . Cancer ([place])    bladder  . History of bladder cancer    s/p  TURBT 07-25-2014  . Hyperlipidemia   . Mass of left hand   . 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 BLADDER SURGERY     CA  . COLONOSCOPY    . CYSTECTOMY    . CYSTOSCOPY WITH BIOPSY N/A 07/25/2014   Procedure: CYSTOSCOPY WITH BLADDER BIOPSY AND FULGERATION;  Surgeon: [name], MD;  Location: [facility];  Service: Urology;  Laterality: N/A;  . CYSTOSCOPY WITH BIOPSY N/A 11/28/2014   Procedure: CYSTOSCOPY WITH  BLADDER BIOPSY;  Surgeon: [name], MD;  Location: [facility];  Service: Urology;  Laterality: N/A;  . HEMORRHOID SURGERY  03/15/2012   Procedure: HEMORRHOIDECTOMY;  Surgeon: [name], MD;  Location: [facility];  Service: General;  Laterality: N/A;  . HERNIA REPAIR    . INGUINAL HERNIA REPAIR Bilateral 2006  . INTRAVASCULAR ULTRASOUND/IVUS N/A 03/13/2020   Procedure: Intravascular Ultrasound/IVUS;  Surgeon: Elam Dutch, MD;  Location: [place] [facility];  Service: Cardiovascular;  Laterality: N/A;  . IR IMAGING GUIDED PORT INSERTION  06/01/2020  . IR INTRAVASCULAR ULTRASOUND NON CORONARY  06/08/2020  . IR  INTRAVASCULAR ULTRASOUND NON CORONARY  06/17/2020  . IR IVC FILTER PLMT / S&I /IMG GUID/MOD SED  06/08/2020  . IR PTA VENOUS EXCEPT DIALYSIS CIRCUIT  06/08/2020  . IR RADIOLOGIST EVAL & MGMT  04/30/2020  . IR RADIOLOGIST EVAL & MGMT  05/27/2020  . IR RADIOLOGIST EVAL & MGMT  07/21/2020  . IR TRANSCATH PLC STENT  INITIAL VEIN  INC ANGIOPLASTY  06/17/2020  . IR TRANSCATH PLC STENT 1ST ART NOT LE CV CAR VERT CAR  06/08/2020  . IR US GUIDE VASC ACCESS LEFT  06/08/2020  . IR US GUIDE VASC ACCESS LEFT  06/17/2020  . IR VENO/EXT/UNI LEFT  06/08/2020  . IR VENO/EXT/UNI LEFT  06/17/2020  . IVC VENOGRAPHY N/A 03/13/2020   Procedure: IVC Venography;  Surgeon: Elam Dutch, MD;  Location: [place] [facility];  Service: Cardiovascular;  Laterality: N/A;  . LOWER EXTREMITY VENOGRAPHY Left 03/13/2020   Procedure: LOWER EXTREMITY VENOGRAPHY;  Surgeon: Elam Dutch, MD;  Location: [place] [facility];  Service: Cardiovascular;  Laterality: Left;  Marland Kitchen MASS EXCISION Left 11/12/2018   Procedure: EXCISION MASS LEFT HAND;  Surgeon: Daryll Brod, MD;  Location: [place];  Service: Orthopedics;  Laterality: Left;  FAB  . PERIPHERAL VASCULAR INTERVENTION  03/13/2020   Procedure: PERIPHERAL VASCULAR INTERVENTION;  Surgeon: Elam Dutch, MD;  Location: [place] [facility];  Service: Cardiovascular;;  . PERIPHERAL VASCULAR THROMBECTOMY N/A 03/13/2020   Procedure: PERIPHERAL VASCULAR THROMBECTOMY;  Surgeon: Elam Dutch, MD;  Location: [place] [facility];  Service: Cardiovascular;  Laterality: N/A;  . RADIOLOGY WITH ANESTHESIA N/A 02/20/2020   Procedure: MRI LUMBAR W/O CONTRAST  WITH ANESTHESIA;  Surgeon: Radiologist, Medication, MD;  Location: [place];  Service: Radiology;  Laterality: N/A;  . RADIOLOGY WITH ANESTHESIA N/A 06/08/2020   Procedure: IR WITH ANESTHESIA VENOGRAM;  Surgeon: Radiologist, Medication, MD;  Location: [place];  Service: Radiology;  Laterality: N/A;  . TESTICLE SURGERY  2004   Ruptured  Undescended Right testicle   . TRANSURETHRAL RESECTION OF BLADDER TUMOR WITH GYRUS (TURBT-GYRUS) N/A 05/23/2014   Procedure: TRANSURETHRAL RESECTION OF BLADDER TUMOR WITH GYRUS (TURBT-GYRUS);  Surgeon: [name], MD;  [facility];  Service: Urology;  Laterality: N/A;  . WISDOM TOOTH EXTRACTION      There were no vitals filed for this visit.   Subjective Assessment - 08/14/20 0905    Subjective Pt says he had an infusion on Wednesday.  His hemoglobin levels are low so he does not have much energy. He is interested in getting the Flexitouch He can't sit too long or can't stand too long    Pertinent History bladder cancer in 2018 that was cured but then he developed tumor ( MRI on 07/27/2020 showed Chronic metastatic deposit along the left retroperitoneum  involving the iliopsoas, lumbar plexus, and L5/S1 vertebrae) on his spine in July 2021 that grew til it blocked the main artery in his left leg. He started chemo and radiation and had treatment to help stent the blood clot and circulation is improved. He wears wears thigh high and below knee compression hose    Patient Stated Goals to get left leg stronger and to get rid of the leg pain    Currently in Pain? Yes    Pain Score 4     Pain Location Leg    Pain Orientation Left    Pain Descriptors / Indicators Tightness    Pain Type Chronic pain    Pain Radiating Towards down to leg    Pain Onset More than a month ago    Pain Frequency Intermittent    Aggravating Factors  staying in one position for a while    Pain Relieving Factors pt says that having his wife massage his leg  every day                             [facility] Adult PT Treatment/Exercise - 08/14/20 0001      Exercises   Exercises Other Exercises    Other Exercises  Reviewed and performed all exericses in the Pt instructions section with extra emphasis on control and stability around hip.  Pt was able to do all exercised well and  muscle contraction in hip adductors able to be palpated. He was able to get to quaduped with good stability in left hip with alternate leg movments, less stability in tall kneeling. Adivised pt to stop doing stretches in half kneeling especially with left leg forward as it strethes the involved area of his low back too much. Also informed pt about the "Clock Yourself" app so that he could do reaction times exercises to his already extensive home program                  PT Education - 08/14/20 [id number]  Education Details extra emphasis on slowing down exercise and trying to isolate stabalization of muscles around left hip Pt understood and was able to adjust his exericise performance according to cues  Pt given [place] HEP via text and also paper copy    Person(s) Educated Patient    Methods Explanation;Demonstration;Handout               PT Long Term Goals - 08/06/20 1816      PT LONG TERM GOAL #1   Title Pt will report the pain in his left leg is decrased by 75%    Time 8    Period Weeks    Status New      PT LONG TERM GOAL #2   Title Pt will increase the number of sit to stands in 30 seconds to > 14 indicating an improvment in LE functional strength    Baseline 10 on 08/06/2020    Time 8    Period Weeks    Status New      PT LONG TERM GOAL #3   Title Pt will decrease his TUG score to < 8 seconds showing an improvment in functional mobility and decrease risk of fall    Baseline 10.9 sec on 08/05/2020    Time 8    Period Weeks    Status New      PT LONG TERM GOAL #4   Title Pt will increase the strength of his right hip adduction to 2+/5    Baseline 1/5 on 08/06/2020    Time 8    Period Weeks    Status New      PT LONG TERM GOAL #5   Title Pt will be independent in a home exericise program focusing in strengthening the left leg and know how to progress it    Time 8    Period Weeks    Status New                 Plan - 08/14/20 0957    Clinical  Impression Statement Pt appears to be "tired" today as he had an infusion this week and has been having some pain.  He agrees to have a Flexitouch benefit check and I will send an inbasket to Dr. Earleen Newport to see if it would be approved for him and then send demographics Pt was able to modify exercises to have more isolation to left hip weakness and will add them to his HEP    Personal Factors and Comorbidities Comorbidity 3+    Comorbidities lumbar metastatic lesion, chronic DVT, ongoing chemotherapy    Examination-Activity Limitations Locomotion Level;Stairs;Stand;Squat;Lift;Bend;Transfers    Stability/Clinical Decision Making Evolving/Moderate complexity    Rehab Potential Excellent    PT Frequency 1x / week    PT Duration 8 weeks    PT Treatment/Interventions ADLs/Self Care Home Management;Electrical Stimulation;Moist Heat;Gait training;Stair training;Functional mobility training;Therapeutic activities;Neuromuscular re-education;Balance training;Therapeutic exercise;Patient/family education;Manual lymph drainage    PT Next Visit Plan Progress exercise examine squat mechanics and work on more focused activation of left hip muscles, teach HEP with ball and theraband for facilitation  hamstring stretches  soft tissue work and manual stretching, later, consider estim if pt not able to get muscle activation    Consulted and Agree with Plan of Care Patient           Patient will benefit from skilled therapeutic intervention in order to improve the following deficits and impairments:  Abnormal gait,Decreased balance,Decreased endurance,Decreased mobility,Difficulty walking,Increased muscle spasms,Decreased range of  motion,Decreased knowledge of precautions,Decreased activity tolerance,Decreased strength,Increased fascial restricitons,Impaired flexibility,Pain  Visit Diagnosis: Weakness of left leg  Difficulty in walking, not elsewhere classified  Lymphedema, not elsewhere classified     Problem  List Patient Active Problem List   Diagnosis Date Noted  . 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 Leg swelling 03/02/2020  . Paraspinal mass 03/02/2020  . Acute deep vein thrombosis (DVT) of left lower extremity ([place]) 03/02/2020  . Essential hypertension 10/31/2018  . 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 Mixed hyperlipidemia 10/03/2018  . NEVUS, ATYPICAL 10/23/2007  . Tobacco abuse 02/21/2007   Donato Heinz. Owens Shark PT  Norwood Levo 08/14/2020, 10:00 AM  [place], [place], [address] Phone: [phone]   Fax:  [phone]  Name: Brian Benton MRN: [id number] Date of Birth: 08/04/1955

## 2020-08-14 NOTE — Patient Instructions (Signed)
Access Code: [email or web address] URL: [email or web address] Date: 08/14/2020 Prepared by: Maudry Diego  Program Notes Clock yourself app   Exercises Seated Isometric Hip Adduction with Diona Foley - 1 x daily - 7 x weekly - 3 sets - 10 reps Supine Hip Adduction Isometric with Ball - 1 x daily - 7 x weekly - 3 sets - 10 reps Bent Knee Fallouts with Alternating Legs - 1 x daily - 7 x weekly - 3 sets - 10 reps Supine Bridge with Resistance Band - 1 x daily - 7 x weekly - 3 sets - 10 reps Clam with Resistance - 1 x daily - 7 x weekly - 3 sets - 10 reps Wall Quarter Squat - 1 x daily - 7 x weekly - 3 sets - 10 reps

## 2020-08-17 ENCOUNTER — Other Ambulatory Visit: Payer: Self-pay | Admitting: Hematology & Oncology

## 2020-08-17 DIAGNOSIS — C799 Secondary malignant neoplasm of unspecified site: Secondary | ICD-10-CM

## 2020-08-17 MED FILL — LORAZEPAM 0.5 MG TABS: 0.5 | 23 days supply | Qty: 90 | Fill #0

## 2020-08-21 ENCOUNTER — Ambulatory Visit: Payer: 59 | Admitting: Physical Therapy

## 2020-08-21 ENCOUNTER — Other Ambulatory Visit: Payer: Self-pay

## 2020-08-21 ENCOUNTER — Encounter: Payer: Self-pay | Admitting: Physical Therapy

## 2020-08-21 DIAGNOSIS — R262 Difficulty in walking, not elsewhere classified: Secondary | ICD-10-CM

## 2020-08-21 DIAGNOSIS — I890 Lymphedema, not elsewhere classified: Secondary | ICD-10-CM

## 2020-08-21 DIAGNOSIS — R29898 Other symptoms and signs involving the musculoskeletal system: Secondary | ICD-10-CM | POA: Diagnosis not present

## 2020-08-21 NOTE — Therapy (Addendum)
[place], [place], [address] Phone: [phone]   Fax:  [phone]  Physical Therapy Treatment  Patient Details  Name: Brian Benton MRN: [id number] Date of Birth: 12-19-1955 Referring Provider (PT): Dr. Earleen Newport   Encounter Date: 08/21/2020   PT End of Session - 08/21/20 1033    Visit Number 3    Number of Visits 9    Date for PT Re-Evaluation 10/05/20    PT Start Time 0900    PT Stop Time 0950    PT Time Calculation (min) 50 min    Activity Tolerance Patient tolerated treatment well    Behavior During Therapy [facility] for tasks assessed/performed           Past Medical History:  Diagnosis Date  . Cancer ([place])    bladder  . History of bladder cancer    s/p  TURBT 07-25-2014  . Hyperlipidemia   . Mass of left hand   . 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 BLADDER SURGERY     CA  . COLONOSCOPY    . CYSTECTOMY    . CYSTOSCOPY WITH BIOPSY N/A 07/25/2014   Procedure: CYSTOSCOPY WITH BLADDER BIOPSY AND FULGERATION;  Surgeon: [name], MD;  Location: [facility];  Service: Urology;  Laterality: N/A;  . CYSTOSCOPY WITH BIOPSY N/A 11/28/2014   Procedure: CYSTOSCOPY WITH  BLADDER BIOPSY;  Surgeon: [name], MD;  Location: [facility];  Service: Urology;  Laterality: N/A;  . HEMORRHOID SURGERY  03/15/2012   Procedure: HEMORRHOIDECTOMY;  Surgeon: [name], MD;  Location: [facility];  Service: General;  Laterality: N/A;  . HERNIA REPAIR    . INGUINAL HERNIA REPAIR Bilateral 2006  . INTRAVASCULAR ULTRASOUND/IVUS N/A 03/13/2020   Procedure: Intravascular Ultrasound/IVUS;  Surgeon: Elam Dutch, MD;  Location: [place] [facility];  Service: Cardiovascular;  Laterality: N/A;  . IR IMAGING GUIDED PORT INSERTION  06/01/2020  . IR INTRAVASCULAR ULTRASOUND NON CORONARY  06/08/2020  . IR  INTRAVASCULAR ULTRASOUND NON CORONARY  06/17/2020  . IR IVC FILTER PLMT / S&I /IMG GUID/MOD SED  06/08/2020  . IR PTA VENOUS EXCEPT DIALYSIS CIRCUIT  06/08/2020  . IR RADIOLOGIST EVAL & MGMT  04/30/2020  . IR RADIOLOGIST EVAL & MGMT  05/27/2020  . IR RADIOLOGIST EVAL & MGMT  07/21/2020  . IR TRANSCATH PLC STENT  INITIAL VEIN  INC ANGIOPLASTY  06/17/2020  . IR TRANSCATH PLC STENT 1ST ART NOT LE CV CAR VERT CAR  06/08/2020  . IR US GUIDE VASC ACCESS LEFT  06/08/2020  . IR US GUIDE VASC ACCESS LEFT  06/17/2020  . IR VENO/EXT/UNI LEFT  06/08/2020  . IR VENO/EXT/UNI LEFT  06/17/2020  . IVC VENOGRAPHY N/A 03/13/2020   Procedure: IVC Venography;  Surgeon: Elam Dutch, MD;  Location: [place] [facility];  Service: Cardiovascular;  Laterality: N/A;  . LOWER EXTREMITY VENOGRAPHY Left 03/13/2020   Procedure: LOWER EXTREMITY VENOGRAPHY;  Surgeon: Elam Dutch, MD;  Location: [place] [facility];  Service: Cardiovascular;  Laterality: Left;  Marland Kitchen MASS EXCISION Left 11/12/2018   Procedure: EXCISION MASS LEFT HAND;  Surgeon: Daryll Brod, MD;  Location: [place];  Service: Orthopedics;  Laterality: Left;  FAB  . PERIPHERAL VASCULAR INTERVENTION  03/13/2020   Procedure: PERIPHERAL VASCULAR INTERVENTION;  Surgeon: Elam Dutch, MD;  Location: [place] [facility];  Service: Cardiovascular;;  . PERIPHERAL VASCULAR THROMBECTOMY N/A 03/13/2020   Procedure: PERIPHERAL VASCULAR THROMBECTOMY;  Surgeon: Elam Dutch, MD;  Location: [place] [facility];  Service: Cardiovascular;  Laterality: N/A;  . RADIOLOGY WITH ANESTHESIA N/A 02/20/2020   Procedure: MRI LUMBAR W/O CONTRAST  WITH ANESTHESIA;  Surgeon: Radiologist, Medication, MD;  Location: [place];  Service: Radiology;  Laterality: N/A;  . RADIOLOGY WITH ANESTHESIA N/A 06/08/2020   Procedure: IR WITH ANESTHESIA VENOGRAM;  Surgeon: Radiologist, Medication, MD;  Location: [place];  Service: Radiology;  Laterality: N/A;  . TESTICLE SURGERY  2004   Ruptured  Undescended Right testicle   . TRANSURETHRAL RESECTION OF BLADDER TUMOR WITH GYRUS (TURBT-GYRUS) N/A 05/23/2014   Procedure: TRANSURETHRAL RESECTION OF BLADDER TUMOR WITH GYRUS (TURBT-GYRUS);  Surgeon: [name], MD;  Location: [facility];  Service: Urology;  Laterality: N/A;  . WISDOM TOOTH EXTRACTION      There were no vitals filed for this visit.   Subjective Assessment - 08/21/20 0906    Subjective Pt states he had pain all week and was not able to get out of bed yesterday. He thinks it might be from the chemo.  He has had it for 3 weeks and this is his off week. He feels better today and is ready to exrecise.  He has 100% coverage for Flexitouch, and I did not hear back from Dr Earleen Newport yet so pt will call Dr. for approval before her orders it.  He was able to use the [place] for exercise and is happy with that.  He comes in wearing white TED hose thigh high, underarmor running pants for light compression that feels good to the discomfort in his left hip    Pertinent History bladder cancer in 2018 that was cured but then he developed tumor ( MRI on 07/27/2020 showed Chronic metastatic deposit along the left retroperitoneum  involving the iliopsoas, lumbar plexus, and L5/S1 vertebrae) on his spine in July 2021 that grew til it blocked the main artery in his left leg. He started chemo and radiation and had treatment to help stent the blood clot and circulation is improved. He wears wears thigh high and below knee compression hose    Patient Stated Goals to get left leg stronger and to get rid of the leg pain    Currently in Pain? No/denies                             [facility] Adult PT Treatment/Exercise - 08/21/20 0001      Exercises   Exercises Knee/Hip      Knee/Hip Exercises: Stretches   Other Knee/Hip Stretches passive stretching into left hip abduction and external rotation with 30 sec holds within pain tolerance      Knee/Hip Exercises: Plyometrics    Other Plyometric Exercises mini hops at counter top x 10 with cues to land more on left hip  Pt was able to generate force to clear the ground      Knee/Hip Exercises: Standing   Lateral Step Up 10 reps    Lateral Step Up Limitations left leg on airex holding 10 pound weight at chest    Forward Step Up 10 reps    Forward Step Up Limitations with left leg on airex mat holding 10 pound weight at chest and then raising it overhead to help with trunk extension and full weight shift onto left leg    Functional Squat  10 reps;Limitations    Functional Squat Limitations Sumo squat position with cues to drop down only a few inches to assure that he has good weight shift onto left leg and recruitment of hip adductors pt able to hold 10# dumbbell    Other Standing Knee Exercises clock yourself appx 50 seconds to colors. Pt did well with this and will download it so he can use it for training at home      Manual Therapy   Manual Therapy Soft tissue mobilization    Manual therapy comments pt has some darkening of left leg compared to right after removal of compression pants  and left leg is a lttle cooler. Pt aware and says his symptoms come and  go and sometimes he has swelling  . He elevates his leg at home    Soft tissue mobilization to left thigh and lateral hip.  Prolonged pressure at tight adductor tendon with some pain relief                  PT Education - 08/21/20 1029    Education Details consider using airex mat for different challenges for exericse, make sure not to squat too low to avoid substitution and target activation of adductors, use clock yourself app for reaction training timing    Person(s) Educated Patient    Methods Explanation;Demonstration    Comprehension Verbalized understanding;Returned demonstration               PT Long Term Goals - 08/06/20 1816      PT LONG TERM GOAL #1   Title Pt will report the pain in his left leg is decrased by 75%    Time 8     Period Weeks    Status New      PT LONG TERM GOAL #2   Title Pt will increase the number of sit to stands in 30 seconds to > 14 indicating an improvment in LE functional strength    Baseline 10 on 08/06/2020    Time 8    Period Weeks    Status New      PT LONG TERM GOAL #3   Title Pt will decrease his TUG score to < 8 seconds showing an improvment in functional mobility and decrease risk of fall    Baseline 10.9 sec on 08/05/2020    Time 8    Period Weeks    Status New      PT LONG TERM GOAL #4   Title Pt will increase the strength of his right hip adduction to 2+/5    Baseline 1/5 on 08/06/2020    Time 8    Period Weeks    Status New      PT LONG TERM GOAL #5   Title Pt will be independent in a home exericise program focusing in strengthening the left leg and know how to progress it    Time 8    Period Weeks    Status New                 Plan - 08/21/20 1034    Clinical Impression Statement Pt had difficulty week with pain and fatigue, but is feeling better today and thinks his leg is getting a little stronger. He will check with Dr. Earleen Newport to see if it is ok for him to have a Flexitouch as I have not heard back. Upgraded exercise focus on more recruitment of adductor and hip rotator with variations as outlined  in note and pt was able to repeat demonstration and understood them    Personal Factors and Comorbidities Comorbidity 3+    Comorbidities lumbar metastatic lesion, chronic DVT, ongoing chemotherapy    Examination-Activity Limitations Locomotion Level;Stairs;Stand;Squat;Lift;Bend;Transfers    PT Frequency 1x / week    PT Duration 8 weeks    PT Treatment/Interventions ADLs/Self Care Home Management;Electrical Stimulation;Moist Heat;Gait training;Stair training;Functional mobility training;Therapeutic activities;Neuromuscular re-education;Balance training;Therapeutic exercise;Patient/family education;Manual lymph drainage    PT Next Visit Plan Progress exercise  examine squat mechanics and work on more focused activation of left hip muscles, teach HEP with ball and theraband for facilitation  hamstring stretches  soft tissue work and manual stretching, later, consider estim if pt not able to get muscle activation    Consulted and Agree with Plan of Care Patient           Patient will benefit from skilled therapeutic intervention in order to improve the following deficits and impairments:  Abnormal gait,Decreased balance,Decreased endurance,Decreased mobility,Difficulty walking,Increased muscle spasms,Decreased range of motion,Decreased knowledge of precautions,Decreased activity tolerance,Decreased strength,Increased fascial restricitons,Impaired flexibility,Pain  Visit Diagnosis: Weakness of left leg  Difficulty in walking, not elsewhere classified  Lymphedema, not elsewhere classified     Problem List Patient Active Problem List   Diagnosis Date Noted  . 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 Leg swelling 03/02/2020  . Paraspinal mass 03/02/2020  . Acute deep vein thrombosis (DVT) of left lower extremity ([place]) 03/02/2020  . Essential hypertension 10/31/2018  . 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 Mixed hyperlipidemia 10/03/2018  . NEVUS, ATYPICAL 10/23/2007  . Tobacco abuse 02/21/2007   Donato Heinz. Owens Shark, PT  Norwood Levo 08/21/2020, 10:38 AM   Addendum:  Dr Earleen Newport replied and said that there is no problem with  the Flexitouch. Will forward information to [place] and Derek.  Donato Heinz. Owens Shark, [place], [place], [address] Phone: [phone]   Fax:  [phone]  Name: ANTRELL TIPLER MRN: [id number] Date of Birth: 09-08-1955

## 2020-08-26 ENCOUNTER — Encounter: Payer: Self-pay | Admitting: *Deleted

## 2020-08-26 ENCOUNTER — Inpatient Hospital Stay: Payer: 59

## 2020-08-26 ENCOUNTER — Ambulatory Visit: Payer: 59 | Admitting: Family

## 2020-08-26 ENCOUNTER — Encounter: Payer: Self-pay | Admitting: Hematology & Oncology

## 2020-08-26 ENCOUNTER — Telehealth: Payer: Self-pay

## 2020-08-26 ENCOUNTER — Other Ambulatory Visit: Payer: Self-pay

## 2020-08-26 ENCOUNTER — Other Ambulatory Visit: Payer: Self-pay | Admitting: *Deleted

## 2020-08-26 ENCOUNTER — Inpatient Hospital Stay (HOSPITAL_BASED_OUTPATIENT_CLINIC_OR_DEPARTMENT_OTHER): Payer: 59 | Admitting: Hematology & Oncology

## 2020-08-26 VITALS — BP 132/72 | HR 86 | Temp 98.6°F | Resp 18 | Ht 72.0 in | Wt 163.8 lb

## 2020-08-26 DIAGNOSIS — C799 Secondary malignant neoplasm of unspecified site: Secondary | ICD-10-CM

## 2020-08-26 DIAGNOSIS — C679 Malignant neoplasm of bladder, unspecified: Secondary | ICD-10-CM

## 2020-08-26 DIAGNOSIS — R222 Localized swelling, mass and lump, trunk: Secondary | ICD-10-CM

## 2020-08-26 DIAGNOSIS — Z5111 Encounter for antineoplastic chemotherapy: Secondary | ICD-10-CM | POA: Diagnosis not present

## 2020-08-26 DIAGNOSIS — D649 Anemia, unspecified: Secondary | ICD-10-CM

## 2020-08-26 LAB — CMP (CANCER CENTER ONLY)
ALT: 10 U/L (ref 0–44)
AST: 10 U/L — ABNORMAL LOW (ref 15–41)
Albumin: 4 g/dL (ref 3.5–5.0)
Alkaline Phosphatase: 53 U/L (ref 38–126)
Anion gap: 6 (ref 5–15)
BUN: 33 mg/dL — ABNORMAL HIGH (ref 8–23)
CO2: 27 mmol/L (ref 22–32)
Calcium: 9.6 mg/dL (ref 8.9–10.3)
Chloride: 105 mmol/L (ref 98–111)
Creatinine: 1.22 mg/dL (ref 0.61–1.24)
GFR, Estimated: >60 mL/min (ref >60)
Glucose, Bld: 97 mg/dL (ref 70–99)
Potassium: 4.3 mmol/L (ref 3.5–5.1)
Sodium: 138 mmol/L (ref 135–145)
Total Bilirubin: 0.2 mg/dL — ABNORMAL LOW (ref 0.3–1.2)
Total Protein: 6.6 g/dL (ref 6.5–8.1)

## 2020-08-26 LAB — CBC WITH DIFFERENTIAL (CANCER CENTER ONLY)
Abs Immature Granulocytes: 0.02 10*3/uL (ref 0.00–0.07)
Basophils Absolute: 0 10*3/uL (ref 0.0–0.1)
Basophils Relative: 1 %
Eosinophils Absolute: 0.2 10*3/uL (ref 0.0–0.5)
Eosinophils Relative: 5 %
HCT: 24.5 % — ABNORMAL LOW (ref 39.0–52.0)
Hemoglobin: 8.2 g/dL — ABNORMAL LOW (ref 13.0–17.0)
Immature Granulocytes: 1 %
Lymphocytes Relative: 14 %
Lymphs Abs: 0.5 10*3/uL — ABNORMAL LOW (ref 0.7–4.0)
MCH: 32.9 pg (ref 26.0–34.0)
MCHC: 33.5 g/dL (ref 30.0–36.0)
MCV: 98.4 fL (ref 80.0–100.0)
Monocytes Absolute: 0.5 10*3/uL (ref 0.1–1.0)
Monocytes Relative: 15 %
Neutro Abs: 2.3 10*3/uL (ref 1.7–7.7)
Neutrophils Relative %: 64 %
Platelet Count: 217 10*3/uL (ref 150–400)
RBC: 2.49 MIL/uL — ABNORMAL LOW (ref 4.22–5.81)
RDW: 16.7 % — ABNORMAL HIGH (ref 11.5–15.5)
WBC Count: 3.5 10*3/uL — ABNORMAL LOW (ref 4.0–10.5)
nRBC: 0 % (ref 0.0–0.2)

## 2020-08-26 LAB — LACTATE DEHYDROGENASE: LDH: 142 U/L (ref 98–192)

## 2020-08-26 LAB — IRON AND TIBC
Iron: 59 ug/dL (ref 42–163)
Saturation Ratios: 21 % (ref 20–55)
TIBC: 281 ug/dL (ref 202–409)
UIBC: 223 ug/dL (ref 117–376)

## 2020-08-26 LAB — MAGNESIUM: Magnesium: 1.8 mg/dL (ref 1.7–2.4)

## 2020-08-26 LAB — FERRITIN: Ferritin: 168 ng/mL (ref 24–336)

## 2020-08-26 MED ORDER — PALONOSETRON HCL INJECTION 0.25 MG/5ML
INTRAVENOUS | Status: AC
  Filled 2020-08-26: qty 5

## 2020-08-26 MED ORDER — PALONOSETRON HCL INJECTION 0.25 MG/5ML
0.2500 mg | Freq: Once | INTRAVENOUS | Status: AC
  Administered 2020-08-26: 0.25 mg via INTRAVENOUS

## 2020-08-26 MED ORDER — SODIUM CHLORIDE 0.9 % IV SOLN
Freq: Once | INTRAVENOUS | Status: AC
Start: 2020-08-26 — End: 2020-08-26
  Filled 2020-08-26: qty 250

## 2020-08-26 MED ORDER — MAGNESIUM SULFATE 2 GM/50ML IV SOLN
2.0000 g | Freq: Once | INTRAVENOUS | Status: AC
  Administered 2020-08-26: 2 g via INTRAVENOUS
  Filled 2020-08-26: qty 50

## 2020-08-26 MED ORDER — SODIUM CHLORIDE 0.9 % IV SOLN
10.0000 mg | Freq: Once | INTRAVENOUS | Status: AC
  Administered 2020-08-26: 10 mg via INTRAVENOUS
  Filled 2020-08-26: qty 10

## 2020-08-26 MED ORDER — SODIUM CHLORIDE 0.9 % IV SOLN
150.0000 mg | Freq: Once | INTRAVENOUS | Status: AC
  Administered 2020-08-26: 150 mg via INTRAVENOUS
  Filled 2020-08-26: qty 150

## 2020-08-26 MED ORDER — SODIUM CHLORIDE 0.9 % IV SOLN
Freq: Once | INTRAVENOUS | Status: AC
  Filled 2020-08-26: qty 250

## 2020-08-26 MED ORDER — GEMCITABINE HCL CHEMO INJECTION 1 GM/26.3ML
2000.0000 mg | Freq: Once | INTRAVENOUS | Status: AC
Start: 2020-08-26 — End: 2020-08-26
  Administered 2020-08-26: 2000 mg via INTRAVENOUS
  Filled 2020-08-26: qty 52.6

## 2020-08-26 MED ORDER — SODIUM CHLORIDE 0.9% FLUSH
10.0000 mL | INTRAVENOUS | Status: DC | PRN
  Administered 2020-08-26: 10 mL
  Filled 2020-08-26: qty 10

## 2020-08-26 MED ORDER — SODIUM CHLORIDE 0.9 % IV SOLN
70.0000 mg/m2 | Freq: Once | INTRAVENOUS | Status: AC
  Administered 2020-08-26: 134 mg via INTRAVENOUS
  Filled 2020-08-26: qty 100

## 2020-08-26 MED ORDER — POTASSIUM CHLORIDE IN NACL 20-0.9 MEQ/L-% IV SOLN
Freq: Once | INTRAVENOUS | Status: AC
  Filled 2020-08-26: qty 1000

## 2020-08-26 MED ORDER — HEPARIN SOD (PORK) LOCK FLUSH 100 UNIT/ML IV SOLN
500.0000 [IU] | Freq: Once | INTRAVENOUS | Status: AC | PRN
  Administered 2020-08-26: 500 [IU]
  Filled 2020-08-26: qty 5

## 2020-08-26 NOTE — Telephone Encounter (Signed)
appts made per 08/26/20 los and pt will rec a sch in chemo    Brian Benton

## 2020-08-26 NOTE — Patient Instructions (Signed)
Implanted Port Insertion, Care After This sheet gives you information about how to care for yourself after your procedure. Your health care provider may also give you more specific instructions. If you have problems or questions, contact your health care provider. What can I expect after the procedure? After the procedure, it is common to have:  Discomfort at the port insertion site.  Bruising on the skin over the port. This should improve over 3-4 days. Follow these instructions at home: Port care  After your port is placed, you will get a manufacturer's information card. The card has information about your port. Keep this card with you at all times.  Take care of the port as told by your health care provider. Ask your health care provider if you or a family member can get training for taking care of the port at home. A home health care nurse may also take care of the port.  Make sure to remember what type of port you have. Incision care  Follow instructions from your health care provider about how to take care of your port insertion site. Make sure you: ? Wash your hands with soap and water before and after you change your bandage (dressing). If soap and water are not available, use hand sanitizer. ? Change your dressing as told by your health care provider. ? Leave stitches (sutures), skin glue, or adhesive strips in place. These skin closures may need to stay in place for 2 weeks or longer. If adhesive strip edges start to loosen and curl up, you may trim the loose edges. Do not remove adhesive strips completely unless your health care provider tells you to do that.  Check your port insertion site every day for signs of infection. Check for: ? Redness, swelling, or pain. ? Fluid or blood. ? Warmth. ? Pus or a bad smell.      Activity  Return to your normal activities as told by your health care provider. Ask your health care provider what activities are safe for you.  Do not  lift anything that is heavier than 10 lb (4.5 kg), or the limit that you are told, until your health care provider says that it is safe. General instructions  Take over-the-counter and prescription medicines only as told by your health care provider.  Do not take baths, swim, or use a hot tub until your health care provider approves. Ask your health care provider if you may take showers. You may only be allowed to take sponge baths.  Do not drive for 24 hours if you were given a sedative during your procedure.  Wear a medical alert bracelet in case of an emergency. This will tell any health care providers that you have a port.  Keep all follow-up visits as told by your health care provider. This is important. Contact a health care provider if:  You cannot flush your port with saline as directed, or you cannot draw blood from the port.  You have a fever or chills.  You have redness, swelling, or pain around your port insertion site.  You have fluid or blood coming from your port insertion site.  Your port insertion site feels warm to the touch.  You have pus or a bad smell coming from the port insertion site. Get help right away if:  You have chest pain or shortness of breath.  You have bleeding from your port that you cannot control. Summary  Take care of the port as told by your   health care provider. Keep the manufacturer's information card with you at all times.  Change your dressing as told by your health care provider.  Contact a health care provider if you have a fever or chills or if you have redness, swelling, or pain around your port insertion site.  Keep all follow-up visits as told by your health care provider. This information is not intended to replace advice given to you by your health care provider. Make sure you discuss any questions you have with your health care provider. Document Revised: 12/26/2017 Document Reviewed: 12/26/2017 Elsevier Patient Education   2021 [employer]  

## 2020-08-26 NOTE — Patient Instructions (Signed)
[facility] Discharge Instructions for Patients Receiving Chemotherapy  Today you received the following chemotherapy agents Cisplatin, Gemzar  To help prevent nausea and vomiting after your treatment, we encourage you to take your nausea medication    If you develop nausea and vomiting that is not controlled by your nausea medication, call the clinic.   BELOW ARE SYMPTOMS THAT SHOULD BE REPORTED IMMEDIATELY:  *FEVER GREATER THAN 100.5 F  *CHILLS WITH OR WITHOUT FEVER  NAUSEA AND VOMITING THAT IS NOT CONTROLLED WITH YOUR NAUSEA MEDICATION  *UNUSUAL SHORTNESS OF BREATH  *UNUSUAL BRUISING OR BLEEDING  TENDERNESS IN MOUTH AND THROAT WITH OR WITHOUT PRESENCE OF ULCERS  *URINARY PROBLEMS  *BOWEL PROBLEMS  UNUSUAL RASH Items with * indicate a potential emergency and should be followed up as soon as possible.  Feel free to call the clinic should you have any questions or concerns. The clinic phone number is [phone].  Please show the CHEMO ALERT CARD at check-in to the Emergency Department and triage nurse.   

## 2020-08-26 NOTE — Progress Notes (Signed)
Hematology and Oncology Follow Up Visit  Brian Benton [id number] 1955-08-03 65 y.o. 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 twice daily  Cis-platinum/Gemzar-start cycle 1 on 08/05/2020     Interim History:  Brian Benton is back for a follow-up.  He now is taking the cis-platinum/gemcitabine protocol.  He is tolerating this much better.  He has little bit of nausea.  He is more active.  He is not having any issues with bleeding.  He still having some pain in the lumbosacral region over on the left side.  He was wondering if this might be amenable to surgery.  I really do not think that it would be amenable to surgery.  However, we will refer him to my orthopedic specialist and see what he would have to say.  He would also like to have a second opinion from [place].  I do not see any problems with this.  We will go ahead and plan to get him set up out there.  I told him that there is just no way that he is going to be "cancer free."  We hopefully might get him into some kind of partial remission and get his pain under better control.  The blood clot in the left leg seems to be doing quite nicely right now.  He is on Eliquis.  There is very little swelling.  He does wear the compression stocking.  He has had no cough or shortness of breath.  He has had no bleeding.  He has occasional nosebleed.  There is no headache.  He has had no problems with any rashes.  Overall, his performance status is ECOG 1.     Medications:  Current Outpatient Medications:  .  apixaban (ELIQUIS) 5 MG TABS tablet, Take 1 tablet (5 mg total) by mouth 2  (two) times daily., Disp: 60 tablet, Rfl: 5 .  ASPIRIN ADULT LOW STRENGTH 81 MG EC tablet, TAKE 1 TABLET BY MOUTH EVERY DAY (Patient taking differently: Take 81 mg by mouth daily.), Disp: 30 tablet, Rfl: 11 .  Calcium Carbonate-Vit D-Min (CALTRATE PLUS PO), Take 1 tablet by mouth daily., Disp: , Rfl:  .  dexamethasone (DECADRON) 4 MG tablet, TAKE TWO TABLETS (8 MG TOTAL) BY MOUTH DAILY WITH FOOD FOR THREE DAYS STARTING THE DAY AFTER CISPLATIN CHEMOTHERAPY., Disp: 30 tablet, Rfl: 1 .  fentaNYL (DURAGESIC) 50 MCG/HR, PLACE 1 PATCH ONTO THE SKIN EVERY 3 (THREE) DAYS., Disp: 10 patch, Rfl: 0 .  gabapentin (NEURONTIN) 600 MG tablet, Take 1 tablet (600 mg total) by mouth in the morning, at noon, in the evening, and at bedtime., Disp: 120 tablet, Rfl: 3 .  Lactulose 20 GM/30ML SOLN, Take 30 mLs (20 g total) by mouth every 4 (four) hours as needed., Disp: 450 mL, Rfl: 2 .  LORazepam (ATIVAN) 0.5 MG tablet, TAKE 1 TABLET (0.5 MG TOTAL) BY MOUTH EVERY 6 (SIX) HOURS AS NEEDED FOR ANXIETY., Disp: 90 tablet, Rfl: 1 .  meloxicam (MOBIC) 15 MG tablet, Take 1 tablet (15 mg total) by mouth daily. (Patient not taking: No sig reported), Disp: 30 tablet, Rfl: 3 .  Multiple Vitamins-Minerals (  MULTIVITAMIN WITH MINERALS) tablet, Take 1 tablet by mouth daily., Disp: , Rfl:  .  ondansetron (ZOFRAN ODT) 8 MG disintegrating tablet, Take 1 tablet (8 mg total) by mouth every 8 (eight) hours as needed for nausea or vomiting., Disp: 30 tablet, Rfl: 0 .  Oxycodone HCl 10 MG TABS, TAKE 1 TABLET (10 MG TOTAL) BY MOUTH EVERY 4 (FOUR) HOURS AS NEEDED., Disp: 90 tablet, Rfl: 0 .  pantoprazole (PROTONIX) 40 MG tablet, Take 40 mg by mouth daily., Disp: , Rfl:  .  prochlorperazine (COMPAZINE) 10 MG tablet, Take 1 tablet (10 mg total) by mouth every 6 (six) hours as needed (Nausea or vomiting)., Disp: 30 tablet, Rfl: 1 .  sodium chloride (OCEAN) 0.65 % SOLN nasal spray, Place 1 spray into both nostrils daily as needed for congestion  (Allergies)., Disp: , Rfl:  .  zolpidem (AMBIEN) 10 MG tablet, TAKE 1 TABLET (10 MG TOTAL) BY MOUTH AT BEDTIME AS NEEDED FOR SLEEP. (Patient not taking: Reported on 08/06/2020), Disp: 30 tablet, Rfl: 0  Allergies: No Known Allergies  Past Medical History, Surgical history, Social history, and Family History were reviewed and updated.  Review of Systems: Review of Systems  Constitutional: Negative.   HENT:  Negative.   Eyes: Negative.   Respiratory: Negative.   Cardiovascular: Positive for leg swelling.  Gastrointestinal: Negative.   Endocrine: Negative.   Musculoskeletal: Positive for flank pain.  Skin: Negative.   Neurological: Negative.   Hematological: Negative.   Psychiatric/Behavioral: Negative.     Physical Exam:  vitals were not taken for this visit.   Wt Readings from Last 3 Encounters:  07/29/20 159 lb (72.1 kg)  07/08/20 160 lb 1.3 oz (72.6 kg)  06/24/20 156 lb 3.2 oz (70.9 kg)    Physical Exam Vitals reviewed.  HENT:     Head: Normocephalic and atraumatic.  Eyes:     Pupils: Pupils are equal, round, and reactive to light.  Cardiovascular:     Rate and Rhythm: Normal rate and regular rhythm.     Heart sounds: Normal heart sounds.  Pulmonary:     Effort: Pulmonary effort is normal.     Breath sounds: Normal breath sounds.  Abdominal:     General: Bowel sounds are normal.     Palpations: Abdomen is soft.  Musculoskeletal:        General: No tenderness or deformity. Normal range of motion.     Cervical back: Normal range of motion.     Comments: Extremities shows minimal swelling of the left leg.  He has good range of motion of the leg.  He has good pulses in his distal extremities.  The right leg is unremarkable.  Lymphadenopathy:     Cervical: No cervical adenopathy.  Skin:    General: Skin is warm and dry.     Findings: No erythema or rash.  Neurological:     Mental Status: He is alert and oriented to person, place, and time.  Psychiatric:         Behavior: Behavior normal.        Thought Content: Thought content normal.        Judgment: Judgment normal.    Lab Results  Component Value Date   WBC 4.0 08/12/2020   HGB 8.9 (L) 08/12/2020   HCT 26.8 (L) 08/12/2020   MCV 98.9 08/12/2020   PLT 165 08/12/2020     Chemistry      Component Value Date/Time   NA 135 08/12/2020 0828  K 4.6 08/12/2020 0828   CL 102 08/12/2020 0828   CO2 27 08/12/2020 0828   BUN 27 (H) 08/12/2020 0828   CREATININE 1.16 08/12/2020 0828      Component Value Date/Time   CALCIUM 9.4 08/12/2020 0828   ALKPHOS 49 08/12/2020 0828   AST 12 (L) 08/12/2020 0828   ALT 13 08/12/2020 0828   BILITOT 0.3 08/12/2020 0828      Impression and Plan: Mr. Chalfant is a really nice 65 year old white male.  He has a very interesting problem.  He had in situ bladder cancer about 5 years ago.  Now, he has a recurrence that I would consider metastatic.  This is outside of the bladder.  It is not operable as it is appear to be invading to the psoas muscle and also involving his lower spine.  He completed initial radiation and chemotherapy.  He did very well with this.  He had a very nice response by PET scan and by MRI.  We will go ahead with his second cycle of cis-platinum/gemcitabine.  I would like to try to give 3 cycles and then repeat our scans.  We will have to see about the referrals.  Hopefully we can get these arranged for him.  I does want his quality life to be as good as possible.  Told him that he must take the gabapentin 3 times a day.  He says he only takes this as needed.  We will go ahead and plan to get him back to see [place] in 3 weeks for his third cycle of treatment.   Volanda Napoleon, MD 3/16/20228:09 AM

## 2020-08-26 NOTE — Progress Notes (Signed)
Patient requesting a second opinion at [facility]. Referral placed.  Oncology Nurse Navigator Documentation  Oncology Nurse Navigator Flowsheets 08/26/2020  Abnormal Finding Date -  Confirmed Diagnosis Date -  Diagnosis Status -  Planned Course of Treatment -  Phase of Treatment -  Chemotherapy Pending- Reason: -  Chemo/Radiation Concurrent Actual Start Date: -  Navigator Follow Up Date: 09/16/2020  Navigator Follow Up Reason: Follow-up Appointment;Chemotherapy  Navigator Location [facility]  Referral Date to RadOnc/MedOnc -  Navigator Encounter Type Appt/Treatment Plan Review  Telephone -  Treatment Initiated Date -  Patient Visit Type MedOnc  Treatment Phase Active Tx  Barriers/Navigation Needs Coordination of Care;Education  Education -  Interventions Referrals  Acuity Level 2-Minimal Needs (1-2 Barriers Identified)  Referrals Other  Coordination of Care Other  Education Method -  Support Groups/Services Friends and Family  Time Spent with Patient 30

## 2020-08-26 NOTE — Progress Notes (Signed)
bulatory

## 2020-08-27 ENCOUNTER — Other Ambulatory Visit: Payer: Self-pay | Admitting: *Deleted

## 2020-08-27 DIAGNOSIS — C679 Malignant neoplasm of bladder, unspecified: Secondary | ICD-10-CM

## 2020-08-27 DIAGNOSIS — R222 Localized swelling, mass and lump, trunk: Secondary | ICD-10-CM

## 2020-08-27 DIAGNOSIS — I82402 Acute embolism and thrombosis of unspecified deep veins of left lower extremity: Secondary | ICD-10-CM

## 2020-08-27 DIAGNOSIS — D649 Anemia, unspecified: Secondary | ICD-10-CM

## 2020-08-27 LAB — ERYTHROPOIETIN: Erythropoietin: 60.4 m[IU]/mL — ABNORMAL HIGH (ref 2.6–18.5)

## 2020-08-28 ENCOUNTER — Ambulatory Visit: Payer: 59 | Admitting: Physical Therapy

## 2020-08-28 ENCOUNTER — Other Ambulatory Visit: Payer: Self-pay

## 2020-08-28 DIAGNOSIS — R29898 Other symptoms and signs involving the musculoskeletal system: Secondary | ICD-10-CM

## 2020-08-28 DIAGNOSIS — I890 Lymphedema, not elsewhere classified: Secondary | ICD-10-CM

## 2020-08-28 DIAGNOSIS — R262 Difficulty in walking, not elsewhere classified: Secondary | ICD-10-CM

## 2020-08-28 NOTE — Therapy (Addendum)
[place], [place], [address] Phone: [phone]   Fax:  [phone]  Physical Therapy Treatment  Patient Details  Name: Brian Benton MRN: [id number] Date of Birth: 11/05/1955 Referring Provider (PT): Dr. Earleen Newport   Encounter Date: 08/28/2020   PT End of Session - 08/28/20 1251    Visit Number 4    Number of Visits 9    Date for PT Re-Evaluation 10/05/20    PT Start Time 0900    PT Stop Time 0956    PT Time Calculation (min) 56 min    Activity Tolerance Patient tolerated treatment well    Behavior During Therapy [facility] for tasks assessed/performed           Past Medical History:  Diagnosis Date  . Cancer ([place])    bladder  . History of bladder cancer    s/p  TURBT 07-25-2014  . Hyperlipidemia   . Mass of left hand   . 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 BLADDER SURGERY     [place]  . COLONOSCOPY    . CYSTECTOMY    . CYSTOSCOPY WITH BIOPSY N/A 07/25/2014   Procedure: CYSTOSCOPY WITH BLADDER BIOPSY AND FULGERATION;  Surgeon: [name], MD;  Location: [facility];  Service: Urology;  Laterality: N/A;  . CYSTOSCOPY WITH BIOPSY N/A 11/28/2014   Procedure: CYSTOSCOPY WITH  BLADDER BIOPSY;  Surgeon: [name], MD;  Location: [facility];  Service: Urology;  Laterality: N/A;  . HEMORRHOID SURGERY  03/15/2012   Procedure: HEMORRHOIDECTOMY;  Surgeon: [name], MD;  Location: [facility];  Service: General;  Laterality: N/A;  . HERNIA REPAIR    . INGUINAL HERNIA REPAIR Bilateral 2006  . INTRAVASCULAR ULTRASOUND/IVUS N/A 03/13/2020   Procedure: Intravascular Ultrasound/IVUS;  Surgeon: Elam Dutch, MD;  Location: [place] [facility];  Service: Cardiovascular;  Laterality: N/A;  . IR IMAGING GUIDED PORT INSERTION  06/01/2020  . IR INTRAVASCULAR ULTRASOUND NON CORONARY  06/08/2020  . IR  INTRAVASCULAR ULTRASOUND NON CORONARY  06/17/2020  . IR IVC FILTER PLMT / S&I /IMG GUID/MOD SED  06/08/2020  . IR PTA VENOUS EXCEPT DIALYSIS CIRCUIT  06/08/2020  . IR RADIOLOGIST EVAL & MGMT  04/30/2020  . IR RADIOLOGIST EVAL & MGMT  05/27/2020  . IR RADIOLOGIST EVAL & MGMT  07/21/2020  . IR TRANSCATH PLC STENT  INITIAL VEIN  INC ANGIOPLASTY  06/17/2020  . IR TRANSCATH PLC STENT 1ST ART NOT LE CV CAR VERT CAR  06/08/2020  . IR US GUIDE VASC ACCESS LEFT  06/08/2020  . IR US GUIDE VASC ACCESS LEFT  06/17/2020  . IR VENO/EXT/UNI LEFT  06/08/2020  . IR VENO/EXT/UNI LEFT  06/17/2020  . IVC VENOGRAPHY N/A 03/13/2020   Procedure: IVC Venography;  Surgeon: Elam Dutch, MD;  Location: [place] [facility];  Service: Cardiovascular;  Laterality: N/A;  . LOWER EXTREMITY VENOGRAPHY Left 03/13/2020   Procedure: LOWER EXTREMITY VENOGRAPHY;  Surgeon: Elam Dutch, MD;  Location: [place] [facility];  Service: Cardiovascular;  Laterality: Left;  Marland Kitchen MASS EXCISION Left 11/12/2018   Procedure: EXCISION MASS LEFT HAND;  Surgeon: Daryll Brod, MD;  Location: [place];  Service: Orthopedics;  Laterality: Left;  FAB  . PERIPHERAL VASCULAR INTERVENTION  03/13/2020   Procedure: PERIPHERAL VASCULAR INTERVENTION;  Surgeon: Elam Dutch, MD;  Location: [place] [facility];  Service: Cardiovascular;;  . PERIPHERAL VASCULAR THROMBECTOMY N/A 03/13/2020   Procedure: PERIPHERAL VASCULAR THROMBECTOMY;  Surgeon: Elam Dutch, MD;  Location: [place] [facility];  Service: Cardiovascular;  Laterality: N/A;  . RADIOLOGY WITH ANESTHESIA N/A 02/20/2020   Procedure: MRI LUMBAR W/O CONTRAST  WITH ANESTHESIA;  Surgeon: Radiologist, Medication, MD;  Location: [place];  Service: Radiology;  Laterality: N/A;  . RADIOLOGY WITH ANESTHESIA N/A 06/08/2020   Procedure: IR WITH ANESTHESIA VENOGRAM;  Surgeon: Radiologist, Medication, MD;  Location: [place];  Service: Radiology;  Laterality: N/A;  . TESTICLE SURGERY  2004   Ruptured  Undescended Right testicle   . TRANSURETHRAL RESECTION OF BLADDER TUMOR WITH GYRUS (TURBT-GYRUS) N/A 05/23/2014   Procedure: TRANSURETHRAL RESECTION OF BLADDER TUMOR WITH GYRUS (TURBT-GYRUS);  Surgeon: [name], MD;  Location: [facility];  Service: Urology;  Laterality: N/A;  . WISDOM TOOTH EXTRACTION      There were no vitals filed for this visit.   Subjective Assessment - 08/28/20 1236    Subjective Pt has been able to do his exercises this week, but has still had much fatigue after chemo. He purchased a balance pad and Pilates ball and is able to focus more on specific strengthening to his thigh. He feels that he is walking better  He reports that he got the good relief from MLD last session and also from the [place] especially at his upper thigh and posterior hip. He also finds that the compression pants to this area helps him too.    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 He started chemo and radiation and had treatment to help stent the blood clot and circulation is improved. He wears wears thigh high and below knee compression hose since November 2021.  He now also wears compression running pants to help with the fullness and discomfort in is upper thigh and hip    Patient Stated Goals to get left leg stronger and to get rid of the leg pain    Currently in Pain? No/denies    Pain Onset More than a month ago                             [facility] Adult PT Treatment/Exercise - 08/28/20 0001      Ambulation/Gait   Gait Comments pt was able to jog about 15 feet with good symmetry, walk backward , but had some diffculty with taking large steps and symmetry of weight bearing       Knee/Hip Exercises: Standing   Functional Squat 10 reps    Functional Squat Limitations cues to push through left heel to shift weight and engage left proximal leg more    Other Standing Knee Exercises tall kneeling with left knee on airex pad with reaching arm overhead , neck movments    Other Standing Knee Exercises standing on one leg      Manual Therapy   Manual Therapy Edema management;Manual Lymphatic Drainage (MLD)    Manual therapy comments darkening of left foot with evidence of several dark thin veins in medial foot    Soft tissue mobilization short neck, diaphragmatic breaths, right inguinal nodes, anterior interinguinal anastamosis, left  anterio, lateral and medial thigh, less work on lower leg as pt does not have as much tissue congestion and skin thickeing, then to right sidelying for more work on posterior thigh, laterl hip and back                       PT Long Term Goals - 08/28/20 1303      PT LONG TERM GOAL #1   Title Pt will report the pain in his left leg is decrased by 75%    Time 8    Period Weeks    Status On-going      PT LONG TERM GOAL #2   Title Pt will increase the number of sit to stands in 30 seconds to > 14 indicating an improvment in LE functional strength    Baseline 10 on 08/06/2020    Time 8    Status On-going      PT LONG TERM GOAL #3   Title Pt will decrease his TUG score to < 8 seconds showing an improvment in functional mobility and decrease risk of fall    Baseline 10.9 sec on 08/05/2020    Period Weeks    Status On-going      PT LONG TERM GOAL #4   Title Pt will increase the strength of his right hip adduction to 2+/5    Baseline 1/5 on 08/06/2020    Time 8    Period Weeks    Status On-going      PT LONG TERM GOAL #5   Time 8    Period Weeks    Status On-going      Additional Long Term Goals   Additional Long Term Goals Yes      PT LONG TERM GOAL #6   Title Pt will report he knows how to manage his left leg lymphedema  at home    Time 8    Period Weeks    Status New                 Plan - 08/28/20 1252    Clinical Impression Statement Pt continues to make steady progress with his strength and functional mobility and is progressing his home exercise program.  He still is having diffuculty with fluid congestion lymphedema in his left leg , especially thigh, groin and posterior hip area despite wearing mulitple layers of compression since November 2021, elevation and progressive exercise He presents with hyperpigmentation in his left foot  and lower leg . The skin in his thigh and posterior hip/trunk  is palpably congested, thicker and firmer that lessens with MLD  I anticipate he will benefit from the intemittent sequential pump of the Flexitouch to manage his symptoms at home.    Personal Factors and Comorbidities Comorbidity 3+    Comorbidities lumbar metastatic lesion, chronic DVT, ongoing chemotherapy    Examination-Activity Limitations Locomotion Level;Stairs;Stand;Squat;Lift;Bend;Transfers    Stability/Clinical Decision Making Evolving/Moderate complexity    Rehab Potential Excellent    PT Frequency 1x / week    PT Treatment/Interventions ADLs/Self Care Home Management;Electrical Stimulation;Moist Heat;Gait training;Stair training;Functional mobility training;Therapeutic activities;Neuromuscular re-education;Balance training;Therapeutic exercise;Patient/family education;Manual lymph drainage    PT Next Visit Plan Progress exercise and gait. continue MLD   measure next session    Consulted and Agree with Plan of Care Patient           Patient will benefit from skilled therapeutic intervention in order to improve the following deficits and impairments:  Abnormal  gait,Decreased balance,Decreased endurance,Decreased mobility,Difficulty walking,Increased muscle spasms,Decreased range of motion,Decreased knowledge of precautions,Decreased activity tolerance,Decreased strength,Increased fascial  restricitons,Impaired flexibility,Pain  Visit Diagnosis: Lymphedema, not elsewhere classified  Weakness of left leg  Difficulty in walking, not elsewhere classified     Problem List Patient Active Problem List   Diagnosis Date Noted  . 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 Leg swelling 03/02/2020  . Paraspinal mass 03/02/2020  . Acute deep vein thrombosis (DVT) of left lower extremity ([place]) 03/02/2020  . Essential hypertension 10/31/2018  . 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 Mixed hyperlipidemia 10/03/2018  . NEVUS, ATYPICAL 10/23/2007  . Tobacco abuse 02/21/2007   Brian Benton, PT  Brian Benton 08/28/2020, 1:06 PM  [place], [place], [address] Phone: [phone]   Fax:  [phone]  Name: Brian Benton MRN: [id number] Date of Birth: [birth year]  Addendum: An E0651 basic pump trial was performed on March 15th and caused an increase in proximal thigh edema so pat failed basic pump and will need the abdominal component of the Flexitouch. Brian Benton, PT 09/01/20 5:26 PM

## 2020-09-01 ENCOUNTER — Other Ambulatory Visit: Payer: Self-pay | Admitting: Hematology & Oncology

## 2020-09-01 DIAGNOSIS — C799 Secondary malignant neoplasm of unspecified site: Secondary | ICD-10-CM

## 2020-09-01 DIAGNOSIS — C679 Malignant neoplasm of bladder, unspecified: Secondary | ICD-10-CM

## 2020-09-02 ENCOUNTER — Inpatient Hospital Stay: Payer: 59

## 2020-09-02 ENCOUNTER — Other Ambulatory Visit: Payer: Self-pay | Admitting: Hematology & Oncology

## 2020-09-02 ENCOUNTER — Other Ambulatory Visit: Payer: Self-pay

## 2020-09-02 VITALS — BP 128/67 | HR 85 | Temp 97.7°F | Resp 18

## 2020-09-02 DIAGNOSIS — C799 Secondary malignant neoplasm of unspecified site: Secondary | ICD-10-CM

## 2020-09-02 DIAGNOSIS — C679 Malignant neoplasm of bladder, unspecified: Secondary | ICD-10-CM

## 2020-09-02 DIAGNOSIS — Z5111 Encounter for antineoplastic chemotherapy: Secondary | ICD-10-CM | POA: Diagnosis not present

## 2020-09-02 DIAGNOSIS — R222 Localized swelling, mass and lump, trunk: Secondary | ICD-10-CM

## 2020-09-02 DIAGNOSIS — D649 Anemia, unspecified: Secondary | ICD-10-CM

## 2020-09-02 LAB — CBC WITH DIFFERENTIAL (CANCER CENTER ONLY)
Abs Immature Granulocytes: 0.02 K/uL (ref 0.00–0.07)
Basophils Absolute: 0 K/uL (ref 0.0–0.1)
Basophils Relative: 1 %
Eosinophils Absolute: 0 K/uL (ref 0.0–0.5)
Eosinophils Relative: 1 %
HCT: 23.2 % — ABNORMAL LOW (ref 39.0–52.0)
Hemoglobin: 7.9 g/dL — ABNORMAL LOW (ref 13.0–17.0)
Immature Granulocytes: 1 %
Lymphocytes Relative: 18 %
Lymphs Abs: 0.4 K/uL — ABNORMAL LOW (ref 0.7–4.0)
MCH: 33.3 pg (ref 26.0–34.0)
MCHC: 34.1 g/dL (ref 30.0–36.0)
MCV: 97.9 fL (ref 80.0–100.0)
Monocytes Absolute: 0.1 K/uL (ref 0.1–1.0)
Monocytes Relative: 4 %
Neutro Abs: 1.8 K/uL (ref 1.7–7.7)
Neutrophils Relative %: 75 %
Platelet Count: 282 K/uL (ref 150–400)
RBC: 2.37 MIL/uL — ABNORMAL LOW (ref 4.22–5.81)
RDW: 16.8 % — ABNORMAL HIGH (ref 11.5–15.5)
WBC Count: 2.4 K/uL — ABNORMAL LOW (ref 4.0–10.5)
nRBC: 0 % (ref 0.0–0.2)

## 2020-09-02 LAB — MAGNESIUM: Magnesium: 1.7 mg/dL (ref 1.7–2.4)

## 2020-09-02 LAB — CMP (CANCER CENTER ONLY)
ALT: 14 U/L (ref 0–44)
AST: 12 U/L — ABNORMAL LOW (ref 15–41)
Albumin: 3.9 g/dL (ref 3.5–5.0)
Alkaline Phosphatase: 53 U/L (ref 38–126)
Anion gap: 8 (ref 5–15)
BUN: 33 mg/dL — ABNORMAL HIGH (ref 8–23)
CO2: 27 mmol/L (ref 22–32)
Calcium: 9.3 mg/dL (ref 8.9–10.3)
Chloride: 100 mmol/L (ref 98–111)
Creatinine: 1.22 mg/dL (ref 0.61–1.24)
GFR, Estimated: >60 mL/min
Glucose, Bld: 114 mg/dL — ABNORMAL HIGH (ref 70–99)
Potassium: 4.5 mmol/L (ref 3.5–5.1)
Sodium: 135 mmol/L (ref 135–145)
Total Bilirubin: 0.4 mg/dL (ref 0.3–1.2)
Total Protein: 6.9 g/dL (ref 6.5–8.1)

## 2020-09-02 LAB — PREPARE RBC (CROSSMATCH)

## 2020-09-02 MED ORDER — HEPARIN SOD (PORK) LOCK FLUSH 100 UNIT/ML IV SOLN
500.0000 [IU] | Freq: Once | INTRAVENOUS | Status: AC | PRN
  Administered 2020-09-02: 500 [IU]
  Filled 2020-09-02: qty 5

## 2020-09-02 MED ORDER — SODIUM CHLORIDE 0.9% FLUSH
10.0000 mL | INTRAVENOUS | Status: DC | PRN
  Administered 2020-09-02: 10 mL
  Filled 2020-09-02: qty 10

## 2020-09-02 MED ORDER — SODIUM CHLORIDE 0.9 % IV SOLN
Freq: Once | INTRAVENOUS | Status: AC
  Filled 2020-09-02: qty 250

## 2020-09-02 MED ORDER — SODIUM CHLORIDE 0.9 % IV SOLN
2000.0000 mg | Freq: Once | INTRAVENOUS | Status: AC
  Administered 2020-09-02: 2000 mg via INTRAVENOUS
  Filled 2020-09-02: qty 52.6

## 2020-09-02 MED ORDER — PROCHLORPERAZINE MALEATE 10 MG PO TABS
10.0000 mg | ORAL_TABLET | Freq: Once | ORAL | Status: AC
  Administered 2020-09-02: 10 mg via ORAL

## 2020-09-02 MED ORDER — PROCHLORPERAZINE MALEATE 10 MG PO TABS
ORAL_TABLET | ORAL | Status: AC
  Filled 2020-09-02: qty 1

## 2020-09-02 MED FILL — fentaNYL 50 MCG/HR PT72: 50 | 30 days supply | Qty: 10 | Fill #0

## 2020-09-02 MED FILL — oxyCODONE HCL 10 MG TABS: 10 | 23 days supply | Qty: 90 | Fill #0

## 2020-09-02 MED FILL — ZOLPIDEM TARTRATE 10 MG TAB: 10 | 30 days supply | Qty: 30 | Fill #0

## 2020-09-02 NOTE — Progress Notes (Signed)
Dr. Marin Olp notified of CBC and CMET results from today.  Order received for pt to get two units of PRBC's on Friday, 09/04/20 and OK to treat with Gemzar as scheduled today per Dr. Marin Olp.

## 2020-09-02 NOTE — Patient Instructions (Signed)
Implanted Port Insertion, Care After This sheet gives you information about how to care for yourself after your procedure. Your health care provider may also give you more specific instructions. If you have problems or questions, contact your health care provider. What can I expect after the procedure? After the procedure, it is common to have:  Discomfort at the port insertion site.  Bruising on the skin over the port. This should improve over 3-4 days. Follow these instructions at home: Port care  After your port is placed, you will get a manufacturer's information card. The card has information about your port. Keep this card with you at all times.  Take care of the port as told by your health care provider. Ask your health care provider if you or a family member can get training for taking care of the port at home. A home health care nurse may also take care of the port.  Make sure to remember what type of port you have. Incision care  Follow instructions from your health care provider about how to take care of your port insertion site. Make sure you: ? Wash your hands with soap and water before and after you change your bandage (dressing). If soap and water are not available, use hand sanitizer. ? Change your dressing as told by your health care provider. ? Leave stitches (sutures), skin glue, or adhesive strips in place. These skin closures may need to stay in place for 2 weeks or longer. If adhesive strip edges start to loosen and curl up, you may trim the loose edges. Do not remove adhesive strips completely unless your health care provider tells you to do that.  Check your port insertion site every day for signs of infection. Check for: ? Redness, swelling, or pain. ? Fluid or blood. ? Warmth. ? Pus or a bad smell.      Activity  Return to your normal activities as told by your health care provider. Ask your health care provider what activities are safe for you.  Do not  lift anything that is heavier than 10 lb (4.5 kg), or the limit that you are told, until your health care provider says that it is safe. General instructions  Take over-the-counter and prescription medicines only as told by your health care provider.  Do not take baths, swim, or use a hot tub until your health care provider approves. Ask your health care provider if you may take showers. You may only be allowed to take sponge baths.  Do not drive for 24 hours if you were given a sedative during your procedure.  Wear a medical alert bracelet in case of an emergency. This will tell any health care providers that you have a port.  Keep all follow-up visits as told by your health care provider. This is important. Contact a health care provider if:  You cannot flush your port with saline as directed, or you cannot draw blood from the port.  You have a fever or chills.  You have redness, swelling, or pain around your port insertion site.  You have fluid or blood coming from your port insertion site.  Your port insertion site feels warm to the touch.  You have pus or a bad smell coming from the port insertion site. Get help right away if:  You have chest pain or shortness of breath.  You have bleeding from your port that you cannot control. Summary  Take care of the port as told by your   health care provider. Keep the manufacturer's information card with you at all times.  Change your dressing as told by your health care provider.  Contact a health care provider if you have a fever or chills or if you have redness, swelling, or pain around your port insertion site.  Keep all follow-up visits as told by your health care provider. This information is not intended to replace advice given to you by your health care provider. Make sure you discuss any questions you have with your health care provider. Document Revised: 12/26/2017 Document Reviewed: 12/26/2017 Elsevier Patient Education   2021 [employer]  

## 2020-09-02 NOTE — Patient Instructions (Signed)
Gemcitabine injection What is this medicine? GEMCITABINE (jem SYE ta been) is a chemotherapy drug. This medicine is used to treat many types of cancer like breast cancer, lung cancer, pancreatic cancer, and ovarian cancer. This medicine may be used for other purposes; ask your health care provider or pharmacist if you have questions. COMMON BRAND NAME(S): Gemzar, Infugem What should I tell my health care provider before I take this medicine? They need to know if you have any of these conditions:  blood disorders  infection  kidney disease  liver disease  lung or breathing disease, like asthma  recent or ongoing radiation therapy  an unusual or allergic reaction to gemcitabine, other chemotherapy, other medicines, foods, dyes, or preservatives  pregnant or trying to get pregnant  breast-feeding How should I use this medicine? This drug is given as an infusion into a vein. It is administered in a hospital or clinic by a specially trained health care professional. Talk to your pediatrician regarding the use of this medicine in children. Special care may be needed. Overdosage: If you think you have taken too much of this medicine contact a poison control center or emergency room at once. NOTE: This medicine is only for you. Do not share this medicine with others. What if I miss a dose? It is important not to miss your dose. Call your doctor or health care professional if you are unable to keep an appointment. What may interact with this medicine?  medicines to increase blood counts like filgrastim, pegfilgrastim, sargramostim  some other chemotherapy drugs like cisplatin  vaccines Talk to your doctor or health care professional before taking any of these medicines:  acetaminophen  aspirin  ibuprofen  ketoprofen  naproxen This list may not describe all possible interactions. Give your health care provider a list of all the medicines, herbs, non-prescription drugs, or  dietary supplements you use. Also tell them if you smoke, drink alcohol, or use illegal drugs. Some items may interact with your medicine. What should I watch for while using this medicine? Visit your doctor for checks on your progress. This drug may make you feel generally unwell. This is not uncommon, as chemotherapy can affect healthy cells as well as cancer cells. Report any side effects. Continue your course of treatment even though you feel ill unless your doctor tells you to stop. In some cases, you may be given additional medicines to help with side effects. Follow all directions for their use. Call your doctor or health care professional for advice if you get a fever, chills or sore throat, or other symptoms of a cold or flu. Do not treat yourself. This drug decreases your body's ability to fight infections. Try to avoid being around people who are sick. This medicine may increase your risk to bruise or bleed. Call your doctor or health care professional if you notice any unusual bleeding. Be careful brushing and flossing your teeth or using a toothpick because you may get an infection or bleed more easily. If you have any dental work done, tell your dentist you are receiving this medicine. Avoid taking products that contain aspirin, acetaminophen, ibuprofen, naproxen, or ketoprofen unless instructed by your doctor. These medicines may hide a fever. Do not become pregnant while taking this medicine or for 6 months after stopping it. Women should inform their doctor if they wish to become pregnant or think they might be pregnant. Men should not father a child while taking this medicine and for 3 months after stopping it.   There is a potential for serious side effects to an unborn child. Talk to your health care professional or pharmacist for more information. Do not breast-feed an infant while taking this medicine or for at least 1 week after stopping it. Men should inform their doctors if they wish  to father a child. This medicine may lower sperm counts. Talk with your doctor or health care professional if you are concerned about your fertility. What side effects may I notice from receiving this medicine? Side effects that you should report to your doctor or health care professional as soon as possible:  allergic reactions like skin rash, itching or hives, swelling of the face, lips, or tongue  breathing problems  pain, redness, or irritation at site where injected  signs and symptoms of a dangerous change in heartbeat or heart rhythm like chest pain; dizziness; fast or irregular heartbeat; palpitations; feeling faint or lightheaded, falls; breathing problems  signs of decreased platelets or bleeding - bruising, pinpoint red spots on the skin, black, tarry stools, blood in the urine  signs of decreased red blood cells - unusually weak or tired, feeling faint or lightheaded, falls  signs of infection - fever or chills, cough, sore throat, pain or difficulty passing urine  signs and symptoms of kidney injury like trouble passing urine or change in the amount of urine  signs and symptoms of liver injury like dark yellow or brown urine; general ill feeling or flu-like symptoms; light-colored stools; loss of appetite; nausea; right upper belly pain; unusually weak or tired; yellowing of the eyes or skin  swelling of ankles, feet, hands Side effects that usually do not require medical attention (report to your doctor or health care professional if they continue or are bothersome):  constipation  diarrhea  hair loss  loss of appetite  nausea  rash  vomiting This list may not describe all possible side effects. Call your doctor for medical advice about side effects. You may report side effects to FDA at 1-800-FDA-1088. Where should I keep my medicine? This drug is given in a hospital or clinic and will not be stored at home. NOTE: This sheet is a summary. It may not cover all  possible information. If you have questions about this medicine, talk to your doctor, pharmacist, or health care provider.  2021 Elsevier/Gold Standard (2017-08-23 18:06:11)  

## 2020-09-03 ENCOUNTER — Ambulatory Visit: Payer: 59 | Admitting: Physical Therapy

## 2020-09-03 DIAGNOSIS — R29898 Other symptoms and signs involving the musculoskeletal system: Secondary | ICD-10-CM | POA: Diagnosis not present

## 2020-09-03 DIAGNOSIS — R262 Difficulty in walking, not elsewhere classified: Secondary | ICD-10-CM

## 2020-09-03 DIAGNOSIS — I890 Lymphedema, not elsewhere classified: Secondary | ICD-10-CM

## 2020-09-03 NOTE — Therapy (Signed)
[place], [place], [address] Phone: [phone]   Fax:  [phone]  Physical Therapy Treatment  Patient Details  Name: Brian Benton MRN: [id number] Date of Birth: 05/25/1956 Referring Provider (PT): Dr. Earleen Newport   Encounter Date: 09/03/2020   PT End of Session - 09/03/20 1735    Visit Number 5    Number of Visits 9    Date for PT Re-Evaluation 10/05/20    PT Start Time 1100    PT Stop Time 1155    PT Time Calculation (min) 55 min    Activity Tolerance Patient tolerated treatment well    Behavior During Therapy [facility] for tasks assessed/performed           Past Medical History:  Diagnosis Date  . Cancer ([place])    bladder  . History of bladder cancer    s/p  TURBT 07-25-2014  . Hyperlipidemia   . Mass of left hand   . 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 BLADDER SURGERY     [place]  . COLONOSCOPY    . CYSTECTOMY    . CYSTOSCOPY WITH BIOPSY N/A 07/25/2014   Procedure: CYSTOSCOPY WITH BLADDER BIOPSY AND FULGERATION;  Surgeon: [name], MD;  Location: [facility];  Service: Urology;  Laterality: N/A;  . CYSTOSCOPY WITH BIOPSY N/A 11/28/2014   Procedure: CYSTOSCOPY WITH  BLADDER BIOPSY;  Surgeon: [name], MD;  Location: [facility];  Service: Urology;  Laterality: N/A;  . HEMORRHOID SURGERY  03/15/2012   Procedure: HEMORRHOIDECTOMY;  Surgeon: [name], MD;  Location: [facility];  Service: General;  Laterality: N/A;  . HERNIA REPAIR    . INGUINAL HERNIA REPAIR Bilateral 2006  . INTRAVASCULAR ULTRASOUND/IVUS N/A 03/13/2020   Procedure: Intravascular Ultrasound/IVUS;  Surgeon: Elam Dutch, MD;  Location: [place] [facility];  Service: Cardiovascular;  Laterality: N/A;  . IR IMAGING GUIDED PORT INSERTION  06/01/2020  . IR INTRAVASCULAR ULTRASOUND NON CORONARY  06/08/2020  . IR  INTRAVASCULAR ULTRASOUND NON CORONARY  06/17/2020  . IR IVC FILTER PLMT / S&I /IMG GUID/MOD SED  06/08/2020  . IR PTA VENOUS EXCEPT DIALYSIS CIRCUIT  06/08/2020  . IR RADIOLOGIST EVAL & MGMT  04/30/2020  . IR RADIOLOGIST EVAL & MGMT  05/27/2020  . IR RADIOLOGIST EVAL & MGMT  07/21/2020  . IR TRANSCATH PLC STENT  INITIAL VEIN  INC ANGIOPLASTY  06/17/2020  . IR TRANSCATH PLC STENT 1ST ART NOT LE CV CAR VERT CAR  06/08/2020  . IR US GUIDE VASC ACCESS LEFT  06/08/2020  . IR US GUIDE VASC ACCESS LEFT  06/17/2020  . IR VENO/EXT/UNI LEFT  06/08/2020  . IR VENO/EXT/UNI LEFT  06/17/2020  . IVC VENOGRAPHY N/A 03/13/2020   Procedure: IVC Venography;  Surgeon: Elam Dutch, MD;  Location: [place] [facility];  Service: Cardiovascular;  Laterality: N/A;  . LOWER EXTREMITY VENOGRAPHY Left 03/13/2020   Procedure: LOWER EXTREMITY VENOGRAPHY;  Surgeon: Elam Dutch, MD;  Location: [place] [facility];  Service: Cardiovascular;  Laterality: Left;  Marland Kitchen MASS EXCISION Left 11/12/2018   Procedure: EXCISION MASS LEFT HAND;  Surgeon: Daryll Brod, MD;  Location: [place];  Service: Orthopedics;  Laterality: Left;  FAB  . PERIPHERAL VASCULAR INTERVENTION  03/13/2020   Procedure: PERIPHERAL VASCULAR INTERVENTION;  Surgeon: Elam Dutch, MD;  Location: [place] [facility];  Service: Cardiovascular;;  . PERIPHERAL VASCULAR THROMBECTOMY N/A 03/13/2020   Procedure: PERIPHERAL VASCULAR THROMBECTOMY;  Surgeon: Elam Dutch, MD;  Location: [place] [facility];  Service: Cardiovascular;  Laterality: N/A;  . RADIOLOGY WITH ANESTHESIA N/A 02/20/2020   Procedure: MRI LUMBAR W/O CONTRAST  WITH ANESTHESIA;  Surgeon: Radiologist, Medication, MD;  Location: [place];  Service: Radiology;  Laterality: N/A;  . RADIOLOGY WITH ANESTHESIA N/A 06/08/2020   Procedure: IR WITH ANESTHESIA VENOGRAM;  Surgeon: Radiologist, Medication, MD;  Location: [place];  Service: Radiology;  Laterality: N/A;  . TESTICLE SURGERY  2004   Ruptured  Undescended Right testicle   . TRANSURETHRAL RESECTION OF BLADDER TUMOR WITH GYRUS (TURBT-GYRUS) N/A 05/23/2014   Procedure: TRANSURETHRAL RESECTION OF BLADDER TUMOR WITH GYRUS (TURBT-GYRUS);  Surgeon: [name], MD;  Location: [facility];  Service: Urology;  Laterality: N/A;  . WISDOM TOOTH EXTRACTION      There were no vitals filed for this visit.   Subjective Assessment - 09/03/20 1730    Subjective Pt reports that his blood counts have been down.  He has been able to do his exerices.  He plans to go to [place] next week to visit family.  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 He started chemo and radiation and had treatment to help stent the blood clot and circulation is improved. He wears wears thigh high and below knee compression hose since November 2021.  He now also wears compression running pants to help with the fullness and discomfort in is upper thigh and hip    Patient Stated Goals to get left leg stronger and to get rid of the leg pain    Currently in Pain? No/denies                             [facility] Adult PT Treatment/Exercise - 09/03/20 0001      Exercises   Exercises Other Exercises    Other Exercises  pt reports he is doing all his exercises at home Answered questions and talked about doing different speeds, holds, pulses, etc to challenge the muscles differently. He acknowledged understanding      Manual Therapy   Manual Therapy Edema management;Manual Lymphatic Drainage (MLD)    Edema Management gave pt lines peach foam on white foam to wear inside compression pants at medial thigh and chip packs to  wear in underwear or jock strap as he says she has occasional gential lymphedem    Soft tissue mobilization short neck, diaphragmatic breaths, right inguinal nodes, anterior interinguinal anastamosis, left anterio, lateral and medial thigh, less work on lower leg as pt does not have as much tissue congestion and skin thickeing, then to right sidelying for more work on posterior thigh, laterl hip and back                       PT Long Term Goals - 08/28/20 1303      PT LONG TERM GOAL #1   Title Pt will report the pain in his left leg is decrased by 75%  Time 8    Period Weeks    Status On-going      PT LONG TERM GOAL #2   Title Pt will increase the number of sit to stands in 30 seconds to > 14 indicating an improvment in LE functional strength    Baseline 10 on 08/06/2020    Time 8    Status On-going      PT LONG TERM GOAL #3   Title Pt will decrease his TUG score to < 8 seconds showing an improvment in functional mobility and decrease risk of fall    Baseline 10.9 sec on 08/05/2020    Period Weeks    Status On-going      PT LONG TERM GOAL #4   Title Pt will increase the strength of his right hip adduction to 2+/5    Baseline 1/5 on 08/06/2020    Time 8    Period Weeks    Status On-going      PT LONG TERM GOAL #5   Time 8    Period Weeks    Status On-going      Additional Long Term Goals   Additional Long Term Goals Yes      PT LONG TERM GOAL #6   Title Pt will report he knows how to manage his left leg lymphedema at home    Time [place] - 09/03/20 1735    Clinical Impression Statement Pt continues to improve in his exercise and reports he can see functional improvement in his mobility.  He continues to have difficutly with lymphedema and has visible and palpable fullness in medial thigh today and reports he has some gential swelling on occasion.  He received symptomatic relief with MLD and received some  foam and chip packs to add to compression as needed especially on his flight. Pt looking forward to his Flexitouch.    Personal Factors and Comorbidities Comorbidity 3+    Comorbidities lumbar metastatic lesion, chronic DVT, ongoing chemotherapy    Stability/Clinical Decision Making Evolving/Moderate complexity    Rehab Potential Excellent    PT Frequency 1x / week    PT Duration 8 weeks    PT Treatment/Interventions ADLs/Self Care Home Management;Electrical Stimulation;Moist Heat;Gait training;Stair training;Functional mobility training;Therapeutic activities;Neuromuscular re-education;Balance training;Therapeutic exercise;Patient/family education;Manual lymph drainage    PT Next Visit Plan reassess  How is Flexitouch going?  Need to upgrade exercises.  Continue MLD and remeasure next session           Patient will benefit from skilled therapeutic intervention in order to improve the following deficits and impairments:  Abnormal gait,Decreased balance,Decreased endurance,Decreased mobility,Difficulty walking,Increased muscle spasms,Decreased range of motion,Decreased knowledge of precautions,Decreased activity tolerance,Decreased strength,Increased fascial restricitons,Impaired flexibility,Pain  Visit Diagnosis: Lymphedema, not elsewhere classified  Weakness of left leg  Difficulty in walking, not elsewhere classified     Problem List Patient Active Problem List   Diagnosis Date Noted  . 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 Leg swelling 03/02/2020  . Paraspinal mass 03/02/2020  . Acute deep vein thrombosis (DVT) of left lower extremity ([place]) 03/02/2020  . Essential hypertension 10/31/2018  . 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 Mixed hyperlipidemia 10/03/2018  . NEVUS, ATYPICAL 10/23/2007  . Tobacco abuse 02/21/2007  Donato Heinz. Owens Shark  PT  Norwood Levo 09/03/2020, 5:39 PM  [place], [place], [address] Phone: [phone]   Fax:  [phone]  Name: DARRILL VREELAND MRN: [id number] Date of Birth: 1955-06-25

## 2020-09-04 ENCOUNTER — Other Ambulatory Visit: Payer: Self-pay

## 2020-09-04 ENCOUNTER — Inpatient Hospital Stay: Payer: 59

## 2020-09-04 ENCOUNTER — Encounter: Payer: 59 | Admitting: Physical Therapy

## 2020-09-04 DIAGNOSIS — Z5111 Encounter for antineoplastic chemotherapy: Secondary | ICD-10-CM | POA: Diagnosis not present

## 2020-09-04 DIAGNOSIS — D649 Anemia, unspecified: Secondary | ICD-10-CM

## 2020-09-04 DIAGNOSIS — R222 Localized swelling, mass and lump, trunk: Secondary | ICD-10-CM

## 2020-09-04 DIAGNOSIS — C799 Secondary malignant neoplasm of unspecified site: Secondary | ICD-10-CM

## 2020-09-04 MED ORDER — SODIUM CHLORIDE 0.9% IV SOLUTION
250.0000 mL | Freq: Once | INTRAVENOUS | Status: AC
  Administered 2020-09-04: 250 mL via INTRAVENOUS
  Filled 2020-09-04: qty 250

## 2020-09-04 MED ORDER — HEPARIN SOD (PORK) LOCK FLUSH 100 UNIT/ML IV SOLN
500.0000 [IU] | Freq: Every day | INTRAVENOUS | Status: AC | PRN
Start: 2020-09-04 — End: 2020-09-04
  Administered 2020-09-04: 500 [IU]
  Filled 2020-09-04: qty 5

## 2020-09-04 MED ORDER — SODIUM CHLORIDE 0.9% FLUSH
10.0000 mL | INTRAVENOUS | Status: AC | PRN
  Administered 2020-09-04: 10 mL
  Filled 2020-09-04: qty 10

## 2020-09-04 NOTE — Patient Instructions (Signed)

## 2020-09-05 LAB — TYPE AND SCREEN
ABO/RH(D): A POS
Antibody Screen: NEGATIVE
Unit division: 0
Unit division: 0

## 2020-09-05 LAB — BPAM RBC
Blood Product Expiration Date: 202204152359
Blood Product Expiration Date: 202204182359
ISSUE DATE / TIME: 202203250755
ISSUE DATE / TIME: 202203250755
Unit Type and Rh: 6200
Unit Type and Rh: 6200

## 2020-09-08 ENCOUNTER — Other Ambulatory Visit: Payer: Self-pay | Admitting: Hematology & Oncology

## 2020-09-08 MED FILL — LORAZEPAM 0.5 MG TABS: 0.5 | 23 days supply | Qty: 90 | Fill #1

## 2020-09-12 ENCOUNTER — Other Ambulatory Visit (HOSPITAL_BASED_OUTPATIENT_CLINIC_OR_DEPARTMENT_OTHER): Payer: Self-pay

## 2020-09-14 ENCOUNTER — Other Ambulatory Visit (HOSPITAL_BASED_OUTPATIENT_CLINIC_OR_DEPARTMENT_OTHER): Payer: Self-pay

## 2020-09-14 ENCOUNTER — Other Ambulatory Visit: Payer: Self-pay | Admitting: Hematology & Oncology

## 2020-09-14 DIAGNOSIS — C799 Secondary malignant neoplasm of unspecified site: Secondary | ICD-10-CM

## 2020-09-14 DIAGNOSIS — C679 Malignant neoplasm of bladder, unspecified: Secondary | ICD-10-CM

## 2020-09-14 MED ORDER — ONDANSETRON 8 MG PO TBDP
ORAL_TABLET | Freq: Three times a day (TID) | ORAL | 0 refills | Status: DC | PRN
  Filled 2020-09-14: qty 30, 4d supply, fill #0

## 2020-09-16 ENCOUNTER — Encounter: Payer: Self-pay | Admitting: *Deleted

## 2020-09-16 ENCOUNTER — Inpatient Hospital Stay: Payer: 59

## 2020-09-16 ENCOUNTER — Telehealth: Payer: Self-pay

## 2020-09-16 ENCOUNTER — Encounter: Payer: Self-pay | Admitting: Hematology & Oncology

## 2020-09-16 ENCOUNTER — Inpatient Hospital Stay (HOSPITAL_BASED_OUTPATIENT_CLINIC_OR_DEPARTMENT_OTHER): Payer: 59 | Admitting: Hematology & Oncology

## 2020-09-16 ENCOUNTER — Inpatient Hospital Stay: Payer: 59 | Attending: Hematology & Oncology

## 2020-09-16 ENCOUNTER — Other Ambulatory Visit: Payer: Self-pay

## 2020-09-16 VITALS — BP 132/87 | HR 83 | Temp 98.0°F | Resp 18 | Wt 168.1 lb

## 2020-09-16 DIAGNOSIS — C679 Malignant neoplasm of bladder, unspecified: Secondary | ICD-10-CM | POA: Diagnosis present

## 2020-09-16 DIAGNOSIS — E611 Iron deficiency: Secondary | ICD-10-CM | POA: Diagnosis not present

## 2020-09-16 DIAGNOSIS — C799 Secondary malignant neoplasm of unspecified site: Secondary | ICD-10-CM

## 2020-09-16 DIAGNOSIS — Z5111 Encounter for antineoplastic chemotherapy: Secondary | ICD-10-CM | POA: Diagnosis present

## 2020-09-16 LAB — CMP (CANCER CENTER ONLY)
ALT: 10 U/L (ref 0–44)
AST: 15 U/L (ref 15–41)
Albumin: 3.6 g/dL (ref 3.5–5.0)
Alkaline Phosphatase: 50 U/L (ref 38–126)
Anion gap: 8 (ref 5–15)
BUN: 22 mg/dL (ref 8–23)
CO2: 25 mmol/L (ref 22–32)
Calcium: 9.3 mg/dL (ref 8.9–10.3)
Chloride: 105 mmol/L (ref 98–111)
Creatinine: 1.28 mg/dL — ABNORMAL HIGH (ref 0.61–1.24)
GFR, Estimated: >60 mL/min (ref >60)
Glucose, Bld: 169 mg/dL — ABNORMAL HIGH (ref 70–99)
Potassium: 4.3 mmol/L (ref 3.5–5.1)
Sodium: 138 mmol/L (ref 135–145)
Total Bilirubin: 0.3 mg/dL (ref 0.3–1.2)
Total Protein: 6.5 g/dL (ref 6.5–8.1)

## 2020-09-16 LAB — CBC WITH DIFFERENTIAL (CANCER CENTER ONLY)
Abs Immature Granulocytes: 0.05 10*3/uL (ref 0.00–0.07)
Basophils Absolute: 0 10*3/uL (ref 0.0–0.1)
Basophils Relative: 1 %
Eosinophils Absolute: 0.1 10*3/uL (ref 0.0–0.5)
Eosinophils Relative: 3 %
HCT: 25.9 % — ABNORMAL LOW (ref 39.0–52.0)
Hemoglobin: 8.5 g/dL — ABNORMAL LOW (ref 13.0–17.0)
Immature Granulocytes: 1 %
Lymphocytes Relative: 11 %
Lymphs Abs: 0.4 10*3/uL — ABNORMAL LOW (ref 0.7–4.0)
MCH: 32.7 pg (ref 26.0–34.0)
MCHC: 32.8 g/dL (ref 30.0–36.0)
MCV: 99.6 fL (ref 80.0–100.0)
Monocytes Absolute: 0.5 10*3/uL (ref 0.1–1.0)
Monocytes Relative: 14 %
Neutro Abs: 2.6 10*3/uL (ref 1.7–7.7)
Neutrophils Relative %: 70 %
Platelet Count: 245 10*3/uL (ref 150–400)
RBC: 2.6 MIL/uL — ABNORMAL LOW (ref 4.22–5.81)
RDW: 17.4 % — ABNORMAL HIGH (ref 11.5–15.5)
WBC Count: 3.6 10*3/uL — ABNORMAL LOW (ref 4.0–10.5)
nRBC: 0 % (ref 0.0–0.2)

## 2020-09-16 LAB — FERRITIN: Ferritin: 244 ng/mL (ref 24–336)

## 2020-09-16 LAB — IRON AND TIBC
Iron: 42 ug/dL (ref 42–163)
Saturation Ratios: 17 % — ABNORMAL LOW (ref 20–55)
TIBC: 240 ug/dL (ref 202–409)
UIBC: 198 ug/dL (ref 117–376)

## 2020-09-16 LAB — LACTATE DEHYDROGENASE: LDH: 179 U/L (ref 98–192)

## 2020-09-16 LAB — MAGNESIUM: Magnesium: 1.8 mg/dL (ref 1.7–2.4)

## 2020-09-16 MED ORDER — SODIUM CHLORIDE 0.9 % IV SOLN
Freq: Once | INTRAVENOUS | Status: DC
  Filled 2020-09-16: qty 250

## 2020-09-16 MED ORDER — FOSAPREPITANT DIMEGLUMINE INJECTION 150 MG
150.0000 mg | Freq: Once | INTRAVENOUS | Status: AC
Start: 2020-09-16 — End: 2020-09-16
  Administered 2020-09-16: 150 mg via INTRAVENOUS
  Filled 2020-09-16: qty 150

## 2020-09-16 MED ORDER — SODIUM CHLORIDE 0.9% FLUSH
10.0000 mL | Freq: Once | INTRAVENOUS | Status: AC
  Administered 2020-09-16: 10 mL via INTRAVENOUS
  Filled 2020-09-16: qty 10

## 2020-09-16 MED ORDER — MAGNESIUM SULFATE 2 GM/50ML IV SOLN
2.0000 g | Freq: Once | INTRAVENOUS | Status: AC
Start: 2020-09-16 — End: 2020-09-16
  Administered 2020-09-16: 2 g via INTRAVENOUS
  Filled 2020-09-16: qty 50

## 2020-09-16 MED ORDER — PALONOSETRON HCL INJECTION 0.25 MG/5ML
INTRAVENOUS | Status: AC
  Filled 2020-09-16: qty 5

## 2020-09-16 MED ORDER — PALONOSETRON HCL INJECTION 0.25 MG/5ML
0.2500 mg | Freq: Once | INTRAVENOUS | Status: AC
  Administered 2020-09-16: 0.25 mg via INTRAVENOUS

## 2020-09-16 MED ORDER — POTASSIUM CHLORIDE IN NACL 20-0.9 MEQ/L-% IV SOLN
Freq: Once | INTRAVENOUS | Status: AC
Start: 2020-09-16 — End: 2020-09-16
  Filled 2020-09-16: qty 1000

## 2020-09-16 MED ORDER — SODIUM CHLORIDE 0.9 % IV SOLN
2000.0000 mg | Freq: Once | INTRAVENOUS | Status: AC
  Administered 2020-09-16: 2000 mg via INTRAVENOUS
  Filled 2020-09-16: qty 52.6

## 2020-09-16 MED ORDER — SODIUM CHLORIDE 0.9 % IV SOLN
10.0000 mg | Freq: Once | INTRAVENOUS | Status: AC
Start: 2020-09-16 — End: 2020-09-16
  Administered 2020-09-16: 10 mg via INTRAVENOUS
  Filled 2020-09-16: qty 10

## 2020-09-16 MED ORDER — SODIUM CHLORIDE 0.9 % IV SOLN
Freq: Once | INTRAVENOUS | Status: AC
  Filled 2020-09-16: qty 250

## 2020-09-16 MED ORDER — HEPARIN SOD (PORK) LOCK FLUSH 100 UNIT/ML IV SOLN
500.0000 [IU] | Freq: Once | INTRAVENOUS | Status: AC | PRN
  Administered 2020-09-16: 500 [IU]
  Filled 2020-09-16: qty 5

## 2020-09-16 MED ORDER — SODIUM CHLORIDE 0.9% FLUSH
10.0000 mL | INTRAVENOUS | Status: DC | PRN
  Administered 2020-09-16: 10 mL
  Filled 2020-09-16: qty 10

## 2020-09-16 MED ORDER — SODIUM CHLORIDE 0.9 % IV SOLN
70.0000 mg/m2 | Freq: Once | INTRAVENOUS | Status: AC
  Administered 2020-09-16: 134 mg via INTRAVENOUS
  Filled 2020-09-16: qty 100

## 2020-09-16 NOTE — Progress Notes (Signed)
Patient had PET and MRI ordered which hadn't been scheduled yet. Review of notes shows scheduling has attempted to call patient. Patient given the number to schedule scans. He did so in infusion, and he's now scheduled for 09/29/2020.   Oncology Nurse Navigator Documentation  Oncology Nurse Navigator Flowsheets 09/16/2020  Abnormal Finding Date -  Confirmed Diagnosis Date -  Diagnosis Status -  Planned Course of Treatment -  Phase of Treatment -  Chemotherapy Pending- Reason: -  Chemo/Radiation Concurrent Actual Start Date: -  Navigator Follow Up Date: 09/29/2020  Navigator Follow Up Reason: Scan Review  Navigator Location [facility]  Referral Date to RadOnc/MedOnc -  Navigator Encounter Type Treatment;Appt/Treatment Plan Review  Telephone -  Treatment Initiated Date -  Patient Visit Type MedOnc  Treatment Phase Active Tx  Barriers/Navigation Needs Coordination of Care;Education  Education Other  Interventions -  Acuity Level 2-Minimal Needs (1-2 Barriers Identified)  Referrals -  Coordination of Care Radiology  Education Method Verbal  Support Groups/Services Friends and Family  Time Spent with Patient 30

## 2020-09-16 NOTE — Progress Notes (Signed)
Hematology and Oncology Follow Up Visit  Brian Benton [id number] Nov 11, 1955 65 y.o. 09/16/2020   Principle Diagnosis:   Metastatic bladder cancer-localized disease  Extensive thrombus of the left leg-status post thrombectomy and iliac stent  Current Therapy:    Curative radiation with weekly cis-platinum-status post 6 cycle of weekly cis-platinum  --  Completed 04/08/2020   dd MVAC -- s/p cycle #3 -- started on 06/02/2020  --DC on 07/28/2020 secondary to toxicity  Eliquis 5 mg p.o. twice daily  Cis-platinum/Gemzar-s/p cycle 2 - start on 08/05/2020     Interim History:  Brian Benton is back for a follow-up.  He is feeling a bit better.  He is gained some weight.  Had a wonderful time down in [place], [place] this past weekend.  He was down there visiting family.  That a wonderful time.  He really enjoyed going down to [place].  We did give him some blood a couple weeks ago.  The hemoglobin is dropped because of the chemotherapy.  He is not bleeding.  He is on Eliquis.  His left leg seems to be doing a bit better.  He taken some physical therapy.  He feels this is helping quite a bit.  I am very happy for him.  I had set him up with an MRI and PET scan we last saw him.  At [facility], this has not been done yet.  We will see about making referral to [facility].  Apparently, they do have a clinic that deals with spinal metastasis from a cancer.  I think it would be interesting to see if they might have any ideas.  He has had no problems with cough or shortness of breath.  His appetite is improved.  He has had no issues with bowels or bladder.  He does not have problems with constipation.  His overall pain status is doing better.  Overall, his performance status is ECOG 1.       Medications:  Current Outpatient Medications:  .  apixaban (ELIQUIS) 5 MG TABS tablet, TAKE 1 TABLET (5 MG TOTAL) BY MOUTH 2 (TWO) TIMES DAILY., Disp: 60 tablet, Rfl: 5 .  ASPIRIN ADULT LOW STRENGTH 81 MG  EC tablet, TAKE 1 TABLET BY MOUTH EVERY DAY (Patient taking differently: Take 81 mg by mouth daily.), Disp: 30 tablet, Rfl: 11 .  Calcium Carbonate-Vit D-Min (CALTRATE PLUS PO), Take 1 tablet by mouth daily., Disp: , Rfl:  .  dexamethasone (DECADRON) 4 MG tablet, TAKE TWO TABLETS (8 MG TOTAL) BY MOUTH DAILY WITH FOOD FOR THREE DAYS STARTING THE DAY AFTER CISPLATIN CHEMOTHERAPY., Disp: 30 tablet, Rfl: 1 .  fentaNYL (DURAGESIC) 50 MCG/HR, PLACE 1 PATCH ONTO THE SKIN EVERY 3 (THREE) DAYS., Disp: 10 patch, Rfl: 0 .  gabapentin (NEURONTIN) 600 MG tablet, TAKE 1 TABLET (600 MG TOTAL) BY MOUTH IN THE MORNING, AT NOON, IN THE EVENING, AND AT BEDTIME., Disp: 120 tablet, Rfl: 3 .  Lactulose 20 GM/30ML SOLN, Take 30 mLs (20 g total) by mouth every 4 (four) hours as needed., Disp: 450 mL, Rfl: 2 .  lactulose, encephalopathy, (CHRONULAC) 10 GM/15ML SOLN, TAKE 30MLS BY MOUTH EVERY 4 HOURS AS NEEDED, Disp: 450 mL, Rfl: 2 .  LORazepam (ATIVAN) 0.5 MG tablet, TAKE 1 TABLET (0.5 MG TOTAL) BY MOUTH EVERY 6 (SIX) HOURS AS NEEDED FOR ANXIETY., Disp: 90 tablet, Rfl: 1 .  meloxicam (MOBIC) 15 MG tablet, TAKE 1 TABLET (15 MG TOTAL) BY MOUTH DAILY. (Patient not taking: No sig reported), Disp: 30  tablet, Rfl: 3 .  Multiple Vitamins-Minerals (MULTIVITAMIN WITH MINERALS) tablet, Take 1 tablet by mouth daily., Disp: , Rfl:  .  ondansetron (ZOFRAN-ODT) 8 MG disintegrating tablet, TAKE 1 TABLET BY MOUTH EVERY 8 HOURS AS NEEDED FOR NAUSEA & VOMITING, Disp: 30 tablet, Rfl: 0 .  Oxycodone HCl 10 MG TABS, TAKE 1 TABLET BY MOUTH EVERY 4 HOURS AS NEEDED, Disp: 90 tablet, Rfl: 0 .  pantoprazole (PROTONIX) 40 MG tablet, Take 40 mg by mouth daily., Disp: , Rfl:  .  pantoprazole (PROTONIX) 40 MG tablet, TAKE 1 TABLET BY MOUTH ONCE DAILY, Disp: 30 tablet, Rfl: 3 .  prochlorperazine (COMPAZINE) 10 MG tablet, Take 1 tablet (10 mg total) by mouth every 6 (six) hours as needed (Nausea or vomiting)., Disp: 30 tablet, Rfl: 1 .  sodium chloride  (OCEAN) 0.65 % SOLN nasal spray, Place 1 spray into both nostrils daily as needed for congestion (Allergies)., Disp: , Rfl:  .  zolpidem (AMBIEN) 10 MG tablet, TAKE 1 TABLET (10 MG TOTAL) BY MOUTH AT BEDTIME AS NEEDED FOR SLEEP., Disp: 30 tablet, Rfl: 0  Allergies: No Known Allergies  Past Medical History, Surgical history, Social history, and Family History were reviewed and updated.  Review of Systems: Review of Systems  Constitutional: Negative.   HENT:  Negative.   Eyes: Negative.   Respiratory: Negative.   Cardiovascular: Positive for leg swelling.  Gastrointestinal: Negative.   Endocrine: Negative.   Musculoskeletal: Positive for flank pain.  Skin: Negative.   Neurological: Negative.   Hematological: Negative.   Psychiatric/Behavioral: Negative.     Physical Exam:  vitals were not taken for this visit.   Wt Readings from Last 3 Encounters:  08/26/20 163 lb 12.8 oz (74.3 kg)  07/29/20 159 lb (72.1 kg)  07/08/20 160 lb 1.3 oz (72.6 kg)    Physical Exam Vitals reviewed.  HENT:     Head: Normocephalic and atraumatic.  Eyes:     Pupils: Pupils are equal, round, and reactive to light.  Cardiovascular:     Rate and Rhythm: Normal rate and regular rhythm.     Heart sounds: Normal heart sounds.  Pulmonary:     Effort: Pulmonary effort is normal.     Breath sounds: Normal breath sounds.  Abdominal:     General: Bowel sounds are normal.     Palpations: Abdomen is soft.  Musculoskeletal:        General: No tenderness or deformity. Normal range of motion.     Cervical back: Normal range of motion.     Comments: Extremities shows minimal swelling of the left leg.  He has good range of motion of the leg.  He has good pulses in his distal extremities.  The right leg is unremarkable.  Lymphadenopathy:     Cervical: No cervical adenopathy.  Skin:    General: Skin is warm and dry.     Findings: No erythema or rash.  Neurological:     Mental Status: He is alert and oriented  to person, place, and time.  Psychiatric:        Behavior: Behavior normal.        Thought Content: Thought content normal.        Judgment: Judgment normal.    Lab Results  Component Value Date   WBC 2.4 (L) 09/02/2020   HGB 7.9 (L) 09/02/2020   HCT 23.2 (L) 09/02/2020   MCV 97.9 09/02/2020   PLT 282 09/02/2020     Chemistry  Component Value Date/Time   NA 135 09/02/2020 1143   K 4.5 09/02/2020 1143   CL 100 09/02/2020 1143   CO2 27 09/02/2020 1143   BUN 33 (H) 09/02/2020 1143   CREATININE 1.22 09/02/2020 1143      Component Value Date/Time   CALCIUM 9.3 09/02/2020 1143   ALKPHOS 53 09/02/2020 1143   AST 12 (L) 09/02/2020 1143   ALT 14 09/02/2020 1143   BILITOT 0.4 09/02/2020 1143      Impression and Plan: Brian Benton is a really nice 65 year old white male.  He has a very interesting problem.  He had in situ bladder cancer about 5 years ago.  Now, he has a recurrence that I would consider metastatic.  This is outside of the bladder.  It is not operable as it is appear to be invading to the psoas muscle and also involving his lower spine.  He completed initial radiation and chemotherapy.  He did very well with this.  He had a very nice response by PET scan and by MRI.  We will go ahead with his 3rd cycle of cis-platinum/gemcitabine.  I would like to try to give 3 cycles and then repeat our scans.  We will have to see about the referrals.  Hopefully we can get these arranged for him.  I does want his quality life to be as good as possible.   Volanda Napoleon, MD 4/6/20228:10 AM

## 2020-09-16 NOTE — Patient Instructions (Signed)
Tunneled Central Venous Catheter Flushing Guide  It is important to flush your tunneled central venous catheter each time you use it, both before and after you use it. Flushing your catheter will help prevent it from clogging. What are the risks? Risks may include:  Infection.  Air getting into the catheter and bloodstream. Supplies needed:  A clean pair of gloves.  A disinfecting wipe. Use an alcohol wipe, chlorhexidine wipe, or iodine wipe as told by your health care provider.  A 10 mL syringe that has been prefilled with saline solution.  An empty 10 mL syringe, if a substance called heparin was injected into your catheter. How to flush your catheter When you flush your catheter, make sure you follow any specific instructions from your health care provider or the manufacturer. These are general guidelines. Flushing your catheter before use If there is heparin in your catheter: 1. Wash your hands with soap and water. 2. Put on gloves. 3. Scrub the injection cap for a minimum of 15 seconds with a disinfecting wipe. 4. Unclamp the catheter. 5. Attach the empty syringe to the injection cap. 6. Pull the syringe plunger back and withdraw 10 mL of blood. 7. Place the syringe into an appropriate waste container. 8. Scrub the injection cap for 15 seconds with a disinfecting wipe. 9. Attach the prefilled syringe to the injection cap. 10. Flush the catheter by pushing the plunger forward until all the liquid from the syringe is in the catheter. 11. Remove the syringe from the injection cap. 12. Clamp the catheter. If there is no heparin in your catheter: 1. Wash your hands with soap and water. 2. Put on gloves. 3. Scrub the injection cap for 15 seconds with a disinfecting wipe. 4. Unclamp the catheter. 5. Attach the prefilled syringe to the injection cap. 6. Flush the catheter by pushing the plunger forward until 5 mL of the liquid from the syringe is in the catheter. 7. Pull back on  the syringe until you see blood in the catheter. 8. If you have been asked to collect any blood, follow your health care provider's instructions. Otherwise, flush the catheter with the rest of the solution from the syringe. 9. Remove the syringe from the injection cap. 10. Clamp the catheter.   Flushing your catheter after use 1. Wash your hands with soap and water. 2. Put on gloves. 3. Scrub the injection cap for 15 seconds with a disinfecting wipe. 4. Unclamp the catheter. 5. Attach the prefilled syringe to the injection cap. 6. Flush the catheter by pushing the plunger forward until all of the liquid from the syringe is in the catheter. 7. Remove the syringe from the injection cap. 8. Clamp the catheter. Problems and solutions  If blood cannot be completely cleared from the injection cap, you may need to have the injection cap replaced.  If the catheter is difficult to flush, use the pulsing method. The pulsing method involves pushing only a few milliliters of solution into the catheter at a time and pausing between pushes.  If you do not see blood in the catheter when you pull back on the syringe, change your body position, such as by raising your arms above your head. Take a deep breath and cough. Then, pull back on the syringe. If you still do not see blood, flush the catheter with a small amount of solution. Then, change positions again and take a breath or cough. Pull back on the syringe again. If you still do not   see blood, finish flushing the catheter and contact your health care provider. Do not use your catheter until your health care provider says it is okay. General tips  Have someone help you flush your catheter, if possible.  Do not force fluid through your catheter.  Do not use a syringe that is larger or smaller than 10 mL. Using a smaller syringe can make the catheter burst.  Do not use your catheter without flushing it first if it has heparin in it. Contact a health  care provider if:  You cannot see any blood in the catheter when you flush it before using it.  Your catheter is difficult to flush. Get help right away if:  You cannot flush the catheter.  The catheter leaks when you flush it or when there is fluid in it.  There are cracks or breaks in the catheter. Summary  It is important to flush your tunneled central venous catheter each time you use it, both before and after you use it.  Scrub the injection cap for 15 seconds with a disinfecting wipe before and after you flush it.  When you flush your catheter, make sure you follow any specific instructions from your health care provider or the manufacturer.  Get help right away if you cannot flush the catheter. This information is not intended to replace advice given to you by your health care provider. Make sure you discuss any questions you have with your health care provider. Document Revised: 08/08/2019 Document Reviewed: 08/15/2018 Elsevier Patient Education  2021 [employer]  

## 2020-09-16 NOTE — Patient Instructions (Signed)
[facility] Discharge Instructions for Patients Receiving Chemotherapy  Today you received the following chemotherapy agents Gemzar, Cisplatin.  To help prevent nausea and vomiting after your treatment, we encourage you to take your nausea medication as indicated by your MD.   If you develop nausea and vomiting that is not controlled by your nausea medication, call the clinic.   BELOW ARE SYMPTOMS THAT SHOULD BE REPORTED IMMEDIATELY:  *FEVER GREATER THAN 100.5 F  *CHILLS WITH OR WITHOUT FEVER  NAUSEA AND VOMITING THAT IS NOT CONTROLLED WITH YOUR NAUSEA MEDICATION  *UNUSUAL SHORTNESS OF BREATH  *UNUSUAL BRUISING OR BLEEDING  TENDERNESS IN MOUTH AND THROAT WITH OR WITHOUT PRESENCE OF ULCERS  *URINARY PROBLEMS  *BOWEL PROBLEMS  UNUSUAL RASH Items with * indicate a potential emergency and should be followed up as soon as possible.  Feel free to call the clinic should you have any questions or concerns. The clinic phone number is [phone].  Please show the [place] at check-in to the Emergency Department and triage nurse.

## 2020-09-16 NOTE — Telephone Encounter (Signed)
09/16/20 los appts made and pt will gain his sch in tx/avs    Brian Benton

## 2020-09-18 ENCOUNTER — Other Ambulatory Visit: Payer: Self-pay

## 2020-09-18 ENCOUNTER — Other Ambulatory Visit: Payer: Self-pay | Admitting: Hematology & Oncology

## 2020-09-18 ENCOUNTER — Other Ambulatory Visit (HOSPITAL_BASED_OUTPATIENT_CLINIC_OR_DEPARTMENT_OTHER): Payer: Self-pay

## 2020-09-18 ENCOUNTER — Ambulatory Visit: Payer: 59 | Attending: Interventional Radiology | Admitting: Physical Therapy

## 2020-09-18 DIAGNOSIS — R29898 Other symptoms and signs involving the musculoskeletal system: Secondary | ICD-10-CM | POA: Diagnosis present

## 2020-09-18 DIAGNOSIS — R262 Difficulty in walking, not elsewhere classified: Secondary | ICD-10-CM | POA: Insufficient documentation

## 2020-09-18 DIAGNOSIS — I890 Lymphedema, not elsewhere classified: Secondary | ICD-10-CM | POA: Diagnosis not present

## 2020-09-18 MED ORDER — PROCHLORPERAZINE MALEATE 10 MG PO TABS
ORAL_TABLET | ORAL | 1 refills | Status: DC
  Filled 2020-09-18: qty 30, 8d supply, fill #0

## 2020-09-18 NOTE — Therapy (Signed)
[place], [place], [address] Phone: [phone]   Fax:  [phone]  Physical Therapy Treatment  Patient Details  Name: Brian Benton MRN: [id number] Date of Birth: [birth year] Referring Provider (PT): Dr. Earleen Newport   Encounter Date: 09/18/2020   PT End of Session - 09/18/20 1211    Visit Number 6    Number of Visits 9    Date for PT Re-Evaluation 10/05/20    PT Start Time 0800    PT Stop Time 0855    PT Time Calculation (min) 55 min    Activity Tolerance Patient tolerated treatment well    Behavior During Therapy [facility] for tasks assessed/performed           Past Medical History:  Diagnosis Date  . Cancer ([place])    bladder  . History of bladder cancer    s/p  TURBT 07-25-2014  . Hyperlipidemia   . Mass of left hand   . 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 BLADDER SURGERY     [place]  . COLONOSCOPY    . CYSTECTOMY    . CYSTOSCOPY WITH BIOPSY N/A 07/25/2014   Procedure: CYSTOSCOPY WITH BLADDER BIOPSY AND FULGERATION;  Surgeon: [name], MD;  Location[facility];  Service: Urology;  Laterality: N/A;  . CYSTOSCOPY WITH BIOPSY N/A 11/28/2014   Procedure: CYSTOSCOPY WITH  BLADDER BIOPSY;  Surgeon: [name], MD;  Location: [facility];  Service: Urology;  Laterality: N/A;  . HEMORRHOID SURGERY  03/15/2012   Procedure: HEMORRHOIDECTOMY;  Surgeon: [name], MD;  Location: [facility];  Service: General;  Laterality: N/A;  . HERNIA REPAIR    . INGUINAL HERNIA REPAIR Bilateral 2006  . INTRAVASCULAR ULTRASOUND/IVUS N/A 03/13/2020   Procedure: Intravascular Ultrasound/IVUS;  Surgeon: Elam Dutch, MD;  Location: [facility];  Service: Cardiovascular;  Laterality: N/A;  . IR IMAGING GUIDED PORT INSERTION  06/01/2020  . IR INTRAVASCULAR ULTRASOUND NON CORONARY  06/08/2020  . IR  INTRAVASCULAR ULTRASOUND NON CORONARY  06/17/2020  . IR IVC FILTER PLMT / S&I /IMG GUID/MOD SED  06/08/2020  . IR PTA VENOUS EXCEPT DIALYSIS CIRCUIT  06/08/2020  . IR RADIOLOGIST EVAL & MGMT  04/30/2020  . IR RADIOLOGIST EVAL & MGMT  05/27/2020  . IR RADIOLOGIST EVAL & MGMT  07/21/2020  . IR TRANSCATH PLC STENT  INITIAL VEIN  INC ANGIOPLASTY  06/17/2020  . IR TRANSCATH PLC STENT 1ST ART NOT LE CV CAR VERT CAR  06/08/2020  . IR US GUIDE VASC ACCESS LEFT  06/08/2020  . IR US GUIDE VASC ACCESS LEFT  06/17/2020  . IR VENO/EXT/UNI LEFT  06/08/2020  . IR VENO/EXT/UNI LEFT  06/17/2020  . IVC VENOGRAPHY N/A 03/13/2020   Procedure: IVC Venography;  Surgeon: Elam Dutch, MD;  Location: [facility];  Service: Cardiovascular;  Laterality: N/A;  . LOWER EXTREMITY VENOGRAPHY Left 03/13/2020   Procedure: LOWER EXTREMITY VENOGRAPHY;  Surgeon: Elam Dutch, MD;  Location: [place];  Service: Cardiovascular;  Laterality: Left;  Marland Kitchen MASS EXCISION Left 11/12/2018   Procedure: EXCISION MASS LEFT HAND;  Surgeon: Daryll Brod, MD;  Location: [facility];  Service: Orthopedics;  Laterality: Left;  FAB  . PERIPHERAL VASCULAR INTERVENTION  03/13/2020   Procedure: PERIPHERAL VASCULAR INTERVENTION;  Surgeon: Elam Dutch, MD;  Location: [facility];  Service: Cardiovascular;;  . PERIPHERAL VASCULAR THROMBECTOMY N/A 03/13/2020   Procedure: PERIPHERAL VASCULAR THROMBECTOMY;  Surgeon: Elam Dutch, MD;  Location: [facility];  Service: Cardiovascular;  Laterality: N/A;  . RADIOLOGY WITH ANESTHESIA N/A 02/20/2020   Procedure: MRI LUMBAR W/O CONTRAST  WITH ANESTHESIA;  Surgeon: Radiologist, Medication, MD;  Location: [facility];  Service: Radiology;  Laterality: N/A;  . RADIOLOGY WITH ANESTHESIA N/A 06/08/2020   Procedure: IR WITH ANESTHESIA VENOGRAM;  Surgeon: Radiologist, Medication, MD;  Location: [facility];  Service: Radiology;  Laterality: N/A;  . TESTICLE SURGERY  2004   Ruptured  Undescended Right testicle   . TRANSURETHRAL RESECTION OF BLADDER TUMOR WITH GYRUS (TURBT-GYRUS) N/A 05/23/2014   Procedure: TRANSURETHRAL RESECTION OF BLADDER TUMOR WITH GYRUS (TURBT-GYRUS);  Surgeon: [name], MD;  Location: [facility];  Service: Urology;  Laterality: N/A;  . WISDOM TOOTH EXTRACTION      There were no vitals filed for this visit.   Subjective Assessment - 09/18/20 1203    Subjective Pt reports he flew to [place] last weekend and had increased swelling in his leg. "my ankle was as big as my knee"  He wishes he would have taken his Flexitouch as it really helped him when he got home. He says he feels better, but still has some swelling especially in medial thigh.  He also had an infusion this week that may attibuted to some of his swelling.  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 He started chemo and radiation and had treatment to help stent the blood clot and circulation is improved. He wears wears thigh high and below knee compression hose since November 2021.  He now also wears compression running pants to help with the fullness and discomfort in is upper thigh and hip    Patient Stated Goals to get left leg stronger and to get rid of the leg pain    Currently in Pain? No/denies              [facility] Assessment - 09/18/20 0001      Functional Tests   Functional tests Sit to Stand      Sit to Stand   Comments 13 reps in 30 seconds   pt feels he is pushing off more on left leg, normal for age is > 12     Timed Up and Go Test   TUG Normal TUG    Normal TUG (seconds) 9.6   normal is 7.9 sec +/- .09 sec             LYMPHEDEMA/ONCOLOGY QUESTIONNAIRE - 09/18/20 0001      Left Lower Extremity Lymphedema   20 cm Proximal to Suprapatella 54.5 cm    10 cm Proximal to Suprapatella 45.5 cm    At Midpatella/Popliteal Crease 36.5 cm    30 cm Proximal to Floor at Lateral Plantar Foot 32.5 cm    20 cm Proximal to Floor at Lateral Plantar Foot 23 cm    10 cm Proximal to Floor at Lateral Malleoli 25 cm    5 cm Proximal to 1st MTP Joint 25 cm    Around Proximal  Great Toe 8.2 cm                      [facility] Adult PT Treatment/Exercise - 09/18/20 0001      Manual Therapy   Manual Therapy Edema management;Manual Lymphatic Drainage (MLD)    Manual therapy comments remeasured legs. Pt has increases today.    Edema Management Pt had concerns about airplane travel as he has more trips he would like to talke.  He would benefit from flat knit garment and possibly nighttime garment    Soft tissue mobilization short neck, diaphragmatic breaths, right inguinal nodes, anterior interinguinal anastamosis, left anterio, lateral and medial thigh, less work on lower leg as pt does not have as much tissue congestion and skin thickeing, then to right sidelying for more work on posterior thigh, laterl hip and back                       PT Long Term Goals - 09/18/20 1216      PT LONG TERM GOAL #1   Title Pt will report the pain in his left leg is decrased by 75%    Time 8    Period Weeks    Status On-going      PT LONG TERM GOAL #2   Title Pt will increase the number of sit to stands in 30 seconds to > 14 indicating an improvment in LE functional strength    Baseline 10 on 08/06/2020, 13 on 09/18/2020    Time 8    Period Weeks    Status On-going      PT LONG TERM GOAL #3   Title Pt will decrease his TUG score to < 8 seconds showing an improvment in functional mobility and decrease risk of fall    Baseline 10.9 sec on 08/05/2020, 9.16 sec on 09/18/2020    Time 8    Period Weeks    Status On-going       PT LONG TERM GOAL #4   Title Pt will increase the strength of his right hip adduction to 2+/5    Baseline 1/5 on 08/06/2020    Time 8    Period Weeks    Status On-going      PT LONG TERM GOAL #5   Title Pt will be independent in a home exericise program focusing in strengthening the left leg and know how to progress it    Status Achieved      PT LONG TERM GOAL #6   Title Pt will report he knows how to manage his left leg lymphedema at home    Time 8    Period Weeks    Status On-going                 Plan - 09/18/20 1211    Clinical Impression Statement Pt has increased swelling after a plane flight and infusion this week, though he says it is better than what is was. He has palpable congestion in left medial thight and alteral hip and buttock. possibley at abdomen.  He did get his Flexitouch and was advised to contatct the sales rep to make adjustments to his abdominal piece should he feel like his getting more abdominal fullness. He wants to take more air travel and would benefit from flat knit garment ( possibley chaps style) and possibly a night garment. He has long legs so may need a custom garment and was scheduled to see Melissa from  [facility] next week.    Personal Factors and Comorbidities Comorbidity 3+    Comorbidities lumbar metastatic lesion, chronic DVT, ongoing chemotherapy    Examination-Activity Limitations Locomotion Level;Stairs;Stand;Squat;Lift;Bend;Transfers    Stability/Clinical Decision Making Evolving/Moderate complexity    PT Frequency 1x / week    PT Duration 8 weeks    PT Treatment/Interventions ADLs/Self Care Home Management;Electrical Stimulation;Moist Heat;Gait training;Stair training;Functional mobility training;Therapeutic activities;Neuromuscular re-education;Balance training;Therapeutic exercise;Patient/family education;Manual lymph drainage    PT Next Visit Plan reassess  How is Flexitouch going?  Need to upgrade exercises.  Continue MLD and remeasure  next session           Patient will benefit from skilled therapeutic intervention in order to improve the following deficits and impairments:  Abnormal gait,Decreased balance,Decreased endurance,Decreased mobility,Difficulty walking,Increased muscle spasms,Decreased range of motion,Decreased knowledge of precautions,Decreased activity tolerance,Decreased strength,Increased fascial restricitons,Impaired flexibility,Pain  Visit Diagnosis: Lymphedema, not elsewhere classified  Weakness of left leg  Difficulty in walking, not elsewhere classified     Problem List Patient Active Problem List   Diagnosis Date Noted  . 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 Leg swelling 03/02/2020  . Paraspinal mass 03/02/2020  . Acute deep vein thrombosis (DVT) of left lower extremity ([place]) 03/02/2020  . Essential hypertension 10/31/2018  . 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 Mixed hyperlipidemia 10/03/2018  . NEVUS, ATYPICAL 10/23/2007  . Tobacco abuse 02/21/2007   Donato Heinz. Owens Shark PT  [place]wood Levo 09/18/2020, 12:18 PM  [place], [place], [address] Phone: [phone]   Fax:  [phone]  Name: Brian Benton MRN: [id number] Date of Birth: 17-Nov-1955

## 2020-09-22 ENCOUNTER — Other Ambulatory Visit: Payer: Self-pay | Admitting: Family

## 2020-09-22 DIAGNOSIS — D509 Iron deficiency anemia, unspecified: Secondary | ICD-10-CM

## 2020-09-23 ENCOUNTER — Other Ambulatory Visit: Payer: 59

## 2020-09-23 ENCOUNTER — Ambulatory Visit: Payer: 59

## 2020-09-24 ENCOUNTER — Other Ambulatory Visit (HOSPITAL_BASED_OUTPATIENT_CLINIC_OR_DEPARTMENT_OTHER): Payer: Self-pay

## 2020-09-24 ENCOUNTER — Inpatient Hospital Stay: Payer: 59

## 2020-09-24 ENCOUNTER — Other Ambulatory Visit: Payer: Self-pay

## 2020-09-24 VITALS — BP 136/71 | HR 76 | Temp 98.2°F | Resp 16

## 2020-09-24 DIAGNOSIS — C679 Malignant neoplasm of bladder, unspecified: Secondary | ICD-10-CM

## 2020-09-24 DIAGNOSIS — D509 Iron deficiency anemia, unspecified: Secondary | ICD-10-CM

## 2020-09-24 DIAGNOSIS — C799 Secondary malignant neoplasm of unspecified site: Secondary | ICD-10-CM

## 2020-09-24 DIAGNOSIS — Z5111 Encounter for antineoplastic chemotherapy: Secondary | ICD-10-CM | POA: Diagnosis not present

## 2020-09-24 LAB — CMP (CANCER CENTER ONLY)
ALT: 12 U/L (ref 0–44)
AST: 13 U/L — ABNORMAL LOW (ref 15–41)
Albumin: 3.9 g/dL (ref 3.5–5.0)
Alkaline Phosphatase: 45 U/L (ref 38–126)
Anion gap: 7 (ref 5–15)
BUN: 21 mg/dL (ref 8–23)
CO2: 28 mmol/L (ref 22–32)
Calcium: 9.7 mg/dL (ref 8.9–10.3)
Chloride: 102 mmol/L (ref 98–111)
Creatinine: 1.28 mg/dL — ABNORMAL HIGH (ref 0.61–1.24)
GFR, Estimated: >60 mL/min (ref >60)
Glucose, Bld: 110 mg/dL — ABNORMAL HIGH (ref 70–99)
Potassium: 4.2 mmol/L (ref 3.5–5.1)
Sodium: 137 mmol/L (ref 135–145)
Total Bilirubin: 0.2 mg/dL — ABNORMAL LOW (ref 0.3–1.2)
Total Protein: 6.6 g/dL (ref 6.5–8.1)

## 2020-09-24 LAB — CBC WITH DIFFERENTIAL (CANCER CENTER ONLY)
Abs Immature Granulocytes: 0.06 10*3/uL (ref 0.00–0.07)
Basophils Absolute: 0.1 10*3/uL (ref 0.0–0.1)
Basophils Relative: 1 %
Eosinophils Absolute: 0.1 10*3/uL (ref 0.0–0.5)
Eosinophils Relative: 1 %
HCT: 25.7 % — ABNORMAL LOW (ref 39.0–52.0)
Hemoglobin: 8.4 g/dL — ABNORMAL LOW (ref 13.0–17.0)
Immature Granulocytes: 2 %
Lymphocytes Relative: 15 %
Lymphs Abs: 0.5 10*3/uL — ABNORMAL LOW (ref 0.7–4.0)
MCH: 32.4 pg (ref 26.0–34.0)
MCHC: 32.7 g/dL (ref 30.0–36.0)
MCV: 99.2 fL (ref 80.0–100.0)
Monocytes Absolute: 0.3 10*3/uL (ref 0.1–1.0)
Monocytes Relative: 7 %
Neutro Abs: 2.7 10*3/uL (ref 1.7–7.7)
Neutrophils Relative %: 74 %
Platelet Count: 294 10*3/uL (ref 150–400)
RBC: 2.59 MIL/uL — ABNORMAL LOW (ref 4.22–5.81)
RDW: 17.2 % — ABNORMAL HIGH (ref 11.5–15.5)
WBC Count: 3.6 10*3/uL — ABNORMAL LOW (ref 4.0–10.5)
nRBC: 0 % (ref 0.0–0.2)

## 2020-09-24 LAB — MAGNESIUM
Magnesium: 1.6 mg/dL — ABNORMAL LOW (ref 1.7–2.4)
Magnesium: 1.6 mg/dL — ABNORMAL LOW (ref 1.7–2.4)

## 2020-09-24 MED ORDER — PROCHLORPERAZINE MALEATE 10 MG PO TABS
10.0000 mg | ORAL_TABLET | Freq: Once | ORAL | Status: AC
  Administered 2020-09-24: 10 mg via ORAL

## 2020-09-24 MED ORDER — IRON SUCROSE 20 MG/ML IV SOLN
200.0000 mg | Freq: Once | INTRAVENOUS | Status: AC
  Administered 2020-09-24: 200 mg via INTRAVENOUS
  Filled 2020-09-24: qty 200

## 2020-09-24 MED ORDER — SODIUM CHLORIDE 0.9 % IV SOLN
Freq: Once | INTRAVENOUS | Status: AC
Start: 2020-09-24 — End: 2020-09-24
  Filled 2020-09-24: qty 250

## 2020-09-24 MED ORDER — HEPARIN SOD (PORK) LOCK FLUSH 100 UNIT/ML IV SOLN
500.0000 [IU] | Freq: Once | INTRAVENOUS | Status: AC | PRN
  Administered 2020-09-24: 500 [IU]
  Filled 2020-09-24: qty 5

## 2020-09-24 MED ORDER — PROCHLORPERAZINE MALEATE 10 MG PO TABS
ORAL_TABLET | ORAL | Status: AC
  Filled 2020-09-24: qty 1

## 2020-09-24 MED ORDER — SODIUM CHLORIDE 0.9 % IV SOLN
Freq: Once | INTRAVENOUS | Status: AC
  Filled 2020-09-24: qty 250

## 2020-09-24 MED ORDER — SODIUM CHLORIDE 0.9% FLUSH
10.0000 mL | INTRAVENOUS | Status: DC | PRN
  Administered 2020-09-24: 10 mL
  Filled 2020-09-24: qty 10

## 2020-09-24 MED ORDER — SODIUM CHLORIDE 0.9 % IV SOLN
2000.0000 mg | Freq: Once | INTRAVENOUS | Status: AC
  Administered 2020-09-24: 2000 mg via INTRAVENOUS
  Filled 2020-09-24: qty 52.6

## 2020-09-24 MED FILL — Apixaban Tab 5 MG: ORAL | 30 days supply | Qty: 60 | Fill #0 | Status: CN

## 2020-09-24 NOTE — Patient Instructions (Signed)
Tunneled Central Venous Catheter Flushing Guide  It is important to flush your tunneled central venous catheter each time you use it, both before and after you use it. Flushing your catheter will help prevent it from clogging. What are the risks? Risks may include:  Infection.  Air getting into the catheter and bloodstream. Supplies needed:  A clean pair of gloves.  A disinfecting wipe. Use an alcohol wipe, chlorhexidine wipe, or iodine wipe as told by your health care provider.  A 10 mL syringe that has been prefilled with saline solution.  An empty 10 mL syringe, if a substance called heparin was injected into your catheter. How to flush your catheter When you flush your catheter, make sure you follow any specific instructions from your health care provider or the manufacturer. These are general guidelines. Flushing your catheter before use If there is heparin in your catheter: 1. Wash your hands with soap and water. 2. Put on gloves. 3. Scrub the injection cap for a minimum of 15 seconds with a disinfecting wipe. 4. Unclamp the catheter. 5. Attach the empty syringe to the injection cap. 6. Pull the syringe plunger back and withdraw 10 mL of blood. 7. Place the syringe into an appropriate waste container. 8. Scrub the injection cap for 15 seconds with a disinfecting wipe. 9. Attach the prefilled syringe to the injection cap. 10. Flush the catheter by pushing the plunger forward until all the liquid from the syringe is in the catheter. 11. Remove the syringe from the injection cap. 12. Clamp the catheter. If there is no heparin in your catheter: 1. Wash your hands with soap and water. 2. Put on gloves. 3. Scrub the injection cap for 15 seconds with a disinfecting wipe. 4. Unclamp the catheter. 5. Attach the prefilled syringe to the injection cap. 6. Flush the catheter by pushing the plunger forward until 5 mL of the liquid from the syringe is in the catheter. 7. Pull back on  the syringe until you see blood in the catheter. 8. If you have been asked to collect any blood, follow your health care provider's instructions. Otherwise, flush the catheter with the rest of the solution from the syringe. 9. Remove the syringe from the injection cap. 10. Clamp the catheter.   Flushing your catheter after use 1. Wash your hands with soap and water. 2. Put on gloves. 3. Scrub the injection cap for 15 seconds with a disinfecting wipe. 4. Unclamp the catheter. 5. Attach the prefilled syringe to the injection cap. 6. Flush the catheter by pushing the plunger forward until all of the liquid from the syringe is in the catheter. 7. Remove the syringe from the injection cap. 8. Clamp the catheter. Problems and solutions  If blood cannot be completely cleared from the injection cap, you may need to have the injection cap replaced.  If the catheter is difficult to flush, use the pulsing method. The pulsing method involves pushing only a few milliliters of solution into the catheter at a time and pausing between pushes.  If you do not see blood in the catheter when you pull back on the syringe, change your body position, such as by raising your arms above your head. Take a deep breath and cough. Then, pull back on the syringe. If you still do not see blood, flush the catheter with a small amount of solution. Then, change positions again and take a breath or cough. Pull back on the syringe again. If you still do not   see blood, finish flushing the catheter and contact your health care provider. Do not use your catheter until your health care provider says it is okay. General tips  Have someone help you flush your catheter, if possible.  Do not force fluid through your catheter.  Do not use a syringe that is larger or smaller than 10 mL. Using a smaller syringe can make the catheter burst.  Do not use your catheter without flushing it first if it has heparin in it. Contact a health  care provider if:  You cannot see any blood in the catheter when you flush it before using it.  Your catheter is difficult to flush. Get help right away if:  You cannot flush the catheter.  The catheter leaks when you flush it or when there is fluid in it.  There are cracks or breaks in the catheter. Summary  It is important to flush your tunneled central venous catheter each time you use it, both before and after you use it.  Scrub the injection cap for 15 seconds with a disinfecting wipe before and after you flush it.  When you flush your catheter, make sure you follow any specific instructions from your health care provider or the manufacturer.  Get help right away if you cannot flush the catheter. This information is not intended to replace advice given to you by your health care provider. Make sure you discuss any questions you have with your health care provider. Document Revised: 08/08/2019 Document Reviewed: 08/15/2018 Elsevier Patient Education  2021 [employer]  

## 2020-09-24 NOTE — Patient Instructions (Signed)
[facility] Discharge Instructions for Patients Receiving Chemotherapy  Today you received the following chemotherapy agents Gemcitabine   To help prevent nausea and vomiting after your treatment, we encourage you to take your nausea medication    If you develop nausea and vomiting that is not controlled by your nausea medication, call the clinic.   BELOW ARE SYMPTOMS THAT SHOULD BE REPORTED IMMEDIATELY:  *FEVER GREATER THAN 100.5 F  *CHILLS WITH OR WITHOUT FEVER  NAUSEA AND VOMITING THAT IS NOT CONTROLLED WITH YOUR NAUSEA MEDICATION  *UNUSUAL SHORTNESS OF BREATH  *UNUSUAL BRUISING OR BLEEDING  TENDERNESS IN MOUTH AND THROAT WITH OR WITHOUT PRESENCE OF ULCERS  *URINARY PROBLEMS  *BOWEL PROBLEMS  UNUSUAL RASH Items with * indicate a potential emergency and should be followed up as soon as possible.  Feel free to call the clinic should you have any questions or concerns. The clinic phone number is [phone].  Please show the [facility] at check-in to the Emergency Department and triage nurse.

## 2020-09-25 ENCOUNTER — Other Ambulatory Visit (HOSPITAL_BASED_OUTPATIENT_CLINIC_OR_DEPARTMENT_OTHER): Payer: Self-pay

## 2020-09-28 ENCOUNTER — Other Ambulatory Visit (HOSPITAL_BASED_OUTPATIENT_CLINIC_OR_DEPARTMENT_OTHER): Payer: Self-pay

## 2020-09-28 MED FILL — Apixaban Tab 5 MG: ORAL | 30 days supply | Qty: 60 | Fill #0 | Status: AC

## 2020-09-29 ENCOUNTER — Encounter: Payer: Self-pay | Admitting: *Deleted

## 2020-09-29 ENCOUNTER — Ambulatory Visit (HOSPITAL_COMMUNITY)
Admission: RE | Admit: 2020-09-29 | Discharge: 2020-09-29 | Disposition: A | Discharge status: Home / Self Care | Payer: 59 | Source: Ambulatory Visit | Attending: Hematology & Oncology | Admitting: Hematology & Oncology

## 2020-09-29 ENCOUNTER — Other Ambulatory Visit (HOSPITAL_BASED_OUTPATIENT_CLINIC_OR_DEPARTMENT_OTHER): Payer: Self-pay

## 2020-09-29 ENCOUNTER — Other Ambulatory Visit: Payer: Self-pay | Admitting: Hematology & Oncology

## 2020-09-29 ENCOUNTER — Other Ambulatory Visit: Payer: Self-pay

## 2020-09-29 DIAGNOSIS — C799 Secondary malignant neoplasm of unspecified site: Secondary | ICD-10-CM

## 2020-09-29 DIAGNOSIS — C679 Malignant neoplasm of bladder, unspecified: Secondary | ICD-10-CM

## 2020-09-29 LAB — GLUCOSE, CAPILLARY: Glucose-Capillary: 97 mg/dL (ref 70–99)

## 2020-09-29 MED ORDER — GADOBUTROL 1 MMOL/ML IV SOLN
7.0000 mL | Freq: Once | INTRAVENOUS | Status: AC | PRN
  Administered 2020-09-29: 7 mL via INTRAVENOUS

## 2020-09-29 MED ORDER — FLUDEOXYGLUCOSE F - 18 (FDG) INJECTION
8.4000 | Freq: Once | INTRAVENOUS | Status: AC
  Administered 2020-09-29: 8.37 via INTRAVENOUS

## 2020-09-29 MED FILL — Dexamethasone Tab 4 MG: ORAL | 15 days supply | Qty: 30 | Fill #0 | Status: AC

## 2020-09-29 MED FILL — Meloxicam Tab 15 MG: ORAL | 30 days supply | Qty: 30 | Fill #0 | Status: AC

## 2020-09-29 NOTE — Progress Notes (Signed)
Oncology Nurse Navigator Documentation  Oncology Nurse Navigator Flowsheets 09/29/2020  Abnormal Finding Date -  Confirmed Diagnosis Date -  Diagnosis Status -  Planned Course of Treatment -  Phase of Treatment -  Chemotherapy Pending- Reason: -  Chemo/Radiation Concurrent Actual Start Date: -  Navigator Follow Up Date: 10/07/2020  Navigator Follow Up Reason: Follow-up Appointment  Navigator Location [facility]  Referral Date to RadOnc/MedOnc -  Navigator Encounter Type Scan Review  Telephone -  Treatment Initiated Date -  Patient Visit Type MedOnc  Treatment Phase Active Tx  Barriers/Navigation Needs Coordination of Care;Education  Education -  Interventions None Required  Acuity Level 2-Minimal Needs (1-2 Barriers Identified)  Referrals -  Coordination of Care -  Education Method -  Support Groups/Services Friends and Family  Time Spent with Patient 15

## 2020-09-30 ENCOUNTER — Other Ambulatory Visit (HOSPITAL_BASED_OUTPATIENT_CLINIC_OR_DEPARTMENT_OTHER): Payer: Self-pay

## 2020-09-30 MED ORDER — GABAPENTIN 600 MG PO TABS
ORAL_TABLET | ORAL | 3 refills | Status: DC
  Filled 2020-09-30: qty 120, 30d supply, fill #0
  Filled 2020-10-26: qty 120, 30d supply, fill #1
  Filled 2020-11-26: qty 120, 30d supply, fill #2

## 2020-09-30 MED ORDER — FENTANYL 50 MCG/HR TD PT72
MEDICATED_PATCH | TRANSDERMAL | 0 refills | Status: DC
  Filled 2020-09-30: qty 10, 30d supply, fill #0

## 2020-09-30 MED ORDER — ZOLPIDEM TARTRATE 10 MG PO TABS
ORAL_TABLET | Freq: Every evening | ORAL | 0 refills | Status: DC | PRN
  Filled 2020-09-30: qty 30, 30d supply, fill #0

## 2020-09-30 MED ORDER — LORAZEPAM 0.5 MG PO TABS
ORAL_TABLET | ORAL | 1 refills | Status: DC
  Filled 2020-09-30: qty 90, 22d supply, fill #0
  Filled 2020-10-19 – 2020-10-22 (×2): qty 90, 22d supply, fill #1

## 2020-10-02 ENCOUNTER — Ambulatory Visit: Payer: 59 | Admitting: Physical Therapy

## 2020-10-02 ENCOUNTER — Other Ambulatory Visit (HOSPITAL_BASED_OUTPATIENT_CLINIC_OR_DEPARTMENT_OTHER): Payer: Self-pay

## 2020-10-02 ENCOUNTER — Other Ambulatory Visit: Payer: Self-pay

## 2020-10-02 DIAGNOSIS — R29898 Other symptoms and signs involving the musculoskeletal system: Secondary | ICD-10-CM

## 2020-10-02 DIAGNOSIS — I890 Lymphedema, not elsewhere classified: Secondary | ICD-10-CM | POA: Diagnosis not present

## 2020-10-02 DIAGNOSIS — R262 Difficulty in walking, not elsewhere classified: Secondary | ICD-10-CM

## 2020-10-02 NOTE — Therapy (Signed)
[place], [place], [address] Phone: [phone]   Fax:  [phone]  Physical Therapy Treatment  Patient Details  Name: Brian Benton MRN: [id number] Date of Birth: April 14, 1956 Referring Provider (PT): Dr. Earleen [place]   Encounter Date: 10/02/2020   PT End of Session - 10/02/20 1307    Visit Number 7    Number of Visits 9    Date for PT Re-Evaluation 10/05/20    PT Start Time 0900    PT Stop Time 0955    PT Time Calculation (min) 55 min    Activity Tolerance Patient tolerated treatment well    Behavior During Therapy [facility] for tasks assessed/performed           Past Medical History:  Diagnosis Date  . Cancer ([place])    bladder  . History of bladder cancer    s/p  TURBT 07-25-2014  . Hyperlipidemia   . Mass of left hand   . 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 BLADDER SURGERY     [place]  . COLONOSCOPY    . CYSTECTOMY    . CYSTOSCOPY WITH BIOPSY N/A 07/25/2014   Procedure: CYSTOSCOPY WITH BLADDER BIOPSY AND FULGERATION;  Surgeon: [name], MD;  Location: [facility];  Service: Urology;  Laterality: N/A;  . CYSTOSCOPY WITH BIOPSY N/A 11/28/2014   Procedure: CYSTOSCOPY WITH  BLADDER BIOPSY;  Surgeon: [name], MD;  Location[facility];  Service: Urology;  Laterality: N/A;  . HEMORRHOID SURGERY  03/15/2012   Procedure: HEMORRHOIDECTOMY;  Surgeon: [name], MD;  Location: [facility];  Service: General;  Laterality: N/A;  . HERNIA REPAIR    . INGUINAL HERNIA REPAIR Bilateral 2006  . INTRAVASCULAR ULTRASOUND/IVUS N/A 03/13/2020   Procedure: Intravascular Ultrasound/IVUS;  Surgeon: Elam Dutch, MD;  Location: [place] [facility];  Service: Cardiovascular;  Laterality: N/A;  . IR IMAGING GUIDED PORT INSERTION  06/01/2020  . IR INTRAVASCULAR ULTRASOUND NON CORONARY  06/08/2020  . IR  INTRAVASCULAR ULTRASOUND NON CORONARY  06/17/2020  . IR IVC FILTER PLMT / S&I /IMG GUID/MOD SED  06/08/2020  . IR PTA VENOUS EXCEPT DIALYSIS CIRCUIT  06/08/2020  . IR RADIOLOGIST EVAL & MGMT  04/30/2020  . IR RADIOLOGIST EVAL & MGMT  05/27/2020  . IR RADIOLOGIST EVAL & MGMT  07/21/2020  . IR TRANSCATH PLC STENT  INITIAL VEIN  INC ANGIOPLASTY  06/17/2020  . IR TRANSCATH PLC STENT 1ST ART NOT LE CV CAR VERT CAR  06/08/2020  . IR US GUIDE VASC ACCESS LEFT  06/08/2020  . IR US GUIDE VASC ACCESS LEFT  06/17/2020  . IR VENO/EXT/UNI LEFT  06/08/2020  . IR VENO/EXT/UNI LEFT  06/17/2020  . IVC VENOGRAPHY N/A 03/13/2020   Procedure: IVC Venography;  Surgeon: Elam Dutch, MD;  Location: [place] [facility];  Service: Cardiovascular;  Laterality: N/A;  . LOWER EXTREMITY VENOGRAPHY Left 03/13/2020   Procedure: LOWER EXTREMITY VENOGRAPHY;  Surgeon: Elam Dutch, MD;  Location: [place] [facility];  Service: Cardiovascular;  Laterality: Left;  Marland Kitchen MASS EXCISION Left 11/12/2018   Procedure: EXCISION MASS LEFT HAND;  Surgeon: Daryll Brod, MD;  Location: [place];  Service: Orthopedics;  Laterality: Left;  FAB  . PERIPHERAL VASCULAR INTERVENTION  03/13/2020   Procedure: PERIPHERAL VASCULAR INTERVENTION;  Surgeon: Elam Dutch, MD;  Location: [place] [facility];  Service: Cardiovascular;;  . PERIPHERAL VASCULAR THROMBECTOMY N/A 03/13/2020   Procedure: PERIPHERAL VASCULAR THROMBECTOMY;  Surgeon: Elam Dutch, MD;  Location: [place] [facility];  Service: Cardiovascular;  Laterality: N/A;  . RADIOLOGY WITH ANESTHESIA N/A 02/20/2020   Procedure: MRI LUMBAR W/O CONTRAST  WITH ANESTHESIA;  Surgeon: Radiologist, Medication, MD;  Location: [place];  Service: Radiology;  Laterality: N/A;  . RADIOLOGY WITH ANESTHESIA N/A 06/08/2020   Procedure: IR WITH ANESTHESIA VENOGRAM;  Surgeon: Radiologist, Medication, MD;  Location: [place];  Service: Radiology;  Laterality: N/A;  . TESTICLE SURGERY  2004   Ruptured  Undescended Right testicle   . TRANSURETHRAL RESECTION OF BLADDER TUMOR WITH GYRUS (TURBT-GYRUS) N/A 05/23/2014   Procedure: TRANSURETHRAL RESECTION OF BLADDER TUMOR WITH GYRUS (TURBT-GYRUS);  Surgeon: [name], MD;  Location: [facility];  Service: Urology;  Laterality: N/A;  . WISDOM TOOTH EXTRACTION      There were no vitals filed for this visit.   Subjective Assessment - 10/02/20 0906    Subjective Pt says he has had a tough couple of weeks.  He has not been able to exercise much because he is anemic from chemo, and has  been to [facility]. He is using the Flexitouch and has not really had any swelling.  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 He started chemo and radiation and had treatment to help stent the blood clot and circulation is improved. He wears wears thigh high and below knee compression hose since November 2021.  He now also wears compression running pants to help with the fullness and discomfort in is upper thigh and hip    Currently in Pain? Yes    Pain Score 4     Pain Location Back    Pain Orientation Left    Pain Descriptors / Indicators Constant    Pain Type Chronic pain    Pain Radiating Towards shoots down into his hamstrings    Pain Onset More than a month ago    Pain Frequency Constant    Aggravating Factors  Fle    Pain Relieving Factors Flexiotuch helps , stretching                             [facility] Adult PT Treatment/Exercise - 10/02/20 0001      Self-Care   Self-Care Other Self-Care Comments    Other Self-Care Comments  showed pt a TENS unit and gave information to him about getting one of this to help  with his pain      Knee/Hip Exercises: Stretches   Hip Flexor Stretch Left;1 rep;60 seconds    Hip Flexor Stretch Limitations Thomas test position with foot on floor    Piriformis Stretch 3 reps    Piriformis Stretch Limitations instructed in muscle energy technique to have left leg in partial external rotatio with foot on pillow on footstool, hand on inside of knee. Press up into hand 3 x 5 sec holds, then push down on knee and stretch forward with right arm and trunk to keep trunk erect and stretch piriformis    Other  Knee/Hip Stretches stretch left glute area with towel around knee and pull to opposite shoulder    Other Knee/Hip Stretches purple ball under sacrum for gentle anterior and posterior pelvic tilts      Manual Therapy   Manual Therapy Passive ROM    Passive ROM stretching and PROM to left hip , thigh in multiple planes including diagonals while cueing avoid rotation of lumbar spine                       PT Long Term Goals - 09/18/20 1216      PT LONG TERM GOAL #1   Title Pt will report the pain in his left leg is decrased by 75%    Time 8    Period Weeks    Status On-going      PT LONG TERM GOAL #2   Title Pt will increase the number of sit to stands in 30 seconds to > 14 indicating an improvment in LE functional strength    Baseline 10 on 08/06/2020, 13 on 09/18/2020    Time 8    Period Weeks    Status On-going      PT LONG TERM GOAL #3   Title Pt will decrease his TUG score to < 8 seconds showing an improvment in functional mobility and decrease risk of fall    Baseline 10.9 sec on 08/05/2020, 9.16 sec on 09/18/2020    Time 8    Period Weeks    Status On-going      PT LONG TERM GOAL #4   Title Pt will increase the strength of his right hip adduction to 2+/5    Baseline 1/5 on 08/06/2020    Time 8    Period Weeks    Status On-going      PT LONG TERM GOAL #5   Title Pt will be independent in a home exericise program focusing in strengthening the left  leg and know how to progress it    Status Achieved      PT LONG TERM GOAL #6   Title Pt will report he knows how to manage his left leg lymphedema at home    Time 8    Period Weeks    Status On-going                 Plan - 10/02/20 1307    Clinical Impression Statement Pt has had a rouch couple of weeks and has not been able to exercise as regularly. He has more back, hip and thigh pain today.  His lymphedema is being controlled by his Flexitouch so treatment today was focues on exercise, passive stretching and ideas for pain managment    Personal Factors and Comorbidities Comorbidity 3+    Comorbidities lumbar metastatic lesion, chronic DVT, ongoing chemotherapy    Examination-Activity Limitations Locomotion Level;Stairs;Stand;Squat;Lift;Bend;Transfers    Stability/Clinical Decision Making Evolving/Moderate complexity    Rehab Potential Excellent    PT Frequency 1x / week    PT Treatment/Interventions ADLs/Self Care Home Management;Electrical Stimulation;Moist Heat;Gait training;Stair training;Functional mobility training;Therapeutic activities;Neuromuscular re-education;Balance training;Therapeutic exercise;Patient/family education;Manual lymph drainage    PT Next Visit Plan reassess / resert.  How is Flexitouch going?  Need to upgrade exercises.  Continue MLD and remeasure next session    Consulted and Agree with Plan of Care Patient           Patient will benefit from skilled therapeutic intervention in order to improve the following deficits and impairments:  Abnormal gait,Decreased balance,Decreased endurance,Decreased mobility,Difficulty walking,Increased muscle spasms,Decreased range of motion,Decreased knowledge of precautions,Decreased activity tolerance,Decreased strength,Increased fascial restricitons,Impaired flexibility,Pain  Visit Diagnosis: Lymphedema, not elsewhere classified  Weakness of left leg  Difficulty in walking, not elsewhere  classified     Problem List Patient Active Problem List   Diagnosis Date Noted  . IDA (iron deficiency anemia) 09/22/2020  . 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 Leg swelling 03/02/2020  . Paraspinal mass 03/02/2020  . Acute deep vein thrombosis (DVT) of left lower extremity ([place]) 03/02/2020  . Essential hypertension 10/31/2018  . 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 Mixed hyperlipidemia 10/03/2018  . NEVUS, ATYPICAL 10/23/2007  . Tobacco abuse 02/21/2007   Donato Heinz. Owens Shark PT  Norwood Levo 10/02/2020, 1:10 PM  [place], [place], [address] Phone: [phone]   Fax:  [phone]  Name: KOAH CHISENHALL MRN: [id number] Date of Birth: [birth year]

## 2020-10-06 ENCOUNTER — Telehealth: Payer: Self-pay

## 2020-10-06 ENCOUNTER — Telehealth: Payer: Self-pay | Admitting: *Deleted

## 2020-10-06 NOTE — Telephone Encounter (Signed)
Message received from patient's wife stating that pt has an appt at [facility] tomorrow and will need to reschedule his appts for this office scheduled for 10/07/20.  Message sent to scheduling.

## 2020-10-06 NOTE — Telephone Encounter (Signed)
S/w pt per sch message as pt has an appt at [facility] 4/27, appts moved to 4/29 and pt is aware  Brian Benton

## 2020-10-07 ENCOUNTER — Inpatient Hospital Stay: Payer: 59

## 2020-10-07 ENCOUNTER — Inpatient Hospital Stay: Payer: 59 | Admitting: Hematology & Oncology

## 2020-10-09 ENCOUNTER — Inpatient Hospital Stay: Payer: 59

## 2020-10-09 ENCOUNTER — Other Ambulatory Visit: Payer: Self-pay

## 2020-10-09 ENCOUNTER — Encounter: Payer: Self-pay | Admitting: Hematology & Oncology

## 2020-10-09 ENCOUNTER — Encounter: Payer: Self-pay | Admitting: *Deleted

## 2020-10-09 ENCOUNTER — Inpatient Hospital Stay (HOSPITAL_BASED_OUTPATIENT_CLINIC_OR_DEPARTMENT_OTHER): Payer: 59 | Admitting: Hematology & Oncology

## 2020-10-09 VITALS — BP 113/69 | HR 79 | Temp 98.9°F | Resp 18 | Ht 72.0 in | Wt 164.8 lb

## 2020-10-09 DIAGNOSIS — C679 Malignant neoplasm of bladder, unspecified: Secondary | ICD-10-CM | POA: Diagnosis not present

## 2020-10-09 DIAGNOSIS — C799 Secondary malignant neoplasm of unspecified site: Secondary | ICD-10-CM | POA: Diagnosis not present

## 2020-10-09 DIAGNOSIS — Z5111 Encounter for antineoplastic chemotherapy: Secondary | ICD-10-CM | POA: Diagnosis not present

## 2020-10-09 LAB — CMP (CANCER CENTER ONLY)
ALT: 9 U/L (ref 0–44)
AST: 11 U/L — ABNORMAL LOW (ref 15–41)
Albumin: 3.9 g/dL (ref 3.5–5.0)
Alkaline Phosphatase: 44 U/L (ref 38–126)
Anion gap: 5 (ref 5–15)
BUN: 26 mg/dL — ABNORMAL HIGH (ref 8–23)
CO2: 29 mmol/L (ref 22–32)
Calcium: 9.8 mg/dL (ref 8.9–10.3)
Chloride: 103 mmol/L (ref 98–111)
Creatinine: 1.37 mg/dL — ABNORMAL HIGH (ref 0.61–1.24)
GFR, Estimated: 57 mL/min — ABNORMAL LOW (ref >60)
Glucose, Bld: 92 mg/dL (ref 70–99)
Potassium: 4.4 mmol/L (ref 3.5–5.1)
Sodium: 137 mmol/L (ref 135–145)
Total Bilirubin: 0.2 mg/dL — ABNORMAL LOW (ref 0.3–1.2)
Total Protein: 6.8 g/dL (ref 6.5–8.1)

## 2020-10-09 LAB — CBC WITH DIFFERENTIAL (CANCER CENTER ONLY)
Abs Immature Granulocytes: 0.06 10*3/uL (ref 0.00–0.07)
Basophils Absolute: 0.1 10*3/uL (ref 0.0–0.1)
Basophils Relative: 1 %
Eosinophils Absolute: 0.2 10*3/uL (ref 0.0–0.5)
Eosinophils Relative: 3 %
HCT: 27.1 % — ABNORMAL LOW (ref 39.0–52.0)
Hemoglobin: 8.6 g/dL — ABNORMAL LOW (ref 13.0–17.0)
Immature Granulocytes: 1 %
Lymphocytes Relative: 10 %
Lymphs Abs: 0.7 10*3/uL (ref 0.7–4.0)
MCH: 32.5 pg (ref 26.0–34.0)
MCHC: 31.7 g/dL (ref 30.0–36.0)
MCV: 102.3 fL — ABNORMAL HIGH (ref 80.0–100.0)
Monocytes Absolute: 1.2 10*3/uL — ABNORMAL HIGH (ref 0.1–1.0)
Monocytes Relative: 17 %
Neutro Abs: 4.9 10*3/uL (ref 1.7–7.7)
Neutrophils Relative %: 68 %
Platelet Count: 313 10*3/uL (ref 150–400)
RBC: 2.65 MIL/uL — ABNORMAL LOW (ref 4.22–5.81)
RDW: 18.9 % — ABNORMAL HIGH (ref 11.5–15.5)
WBC Count: 7.2 10*3/uL (ref 4.0–10.5)
nRBC: 0 % (ref 0.0–0.2)

## 2020-10-09 LAB — LACTATE DEHYDROGENASE: LDH: 166 U/L (ref 98–192)

## 2020-10-09 LAB — SAMPLE TO BLOOD BANK

## 2020-10-09 LAB — MAGNESIUM: Magnesium: 2.1 mg/dL (ref 1.7–2.4)

## 2020-10-09 MED ORDER — SODIUM CHLORIDE 0.9% FLUSH
10.0000 mL | Freq: Once | INTRAVENOUS | Status: AC
  Administered 2020-10-09: 10 mL via INTRAVENOUS
  Filled 2020-10-09: qty 10

## 2020-10-09 NOTE — Progress Notes (Signed)
DISCONTINUE OFF PATHWAY REGIMEN - Bladder   OFF01005:Gemcitabine + Cisplatin every 21 days:   A cycle is every 21 days:     Gemcitabine      Cisplatin   **Always confirm dose/schedule in your pharmacy ordering system**  REASON: Toxicities / Adverse Event PRIOR TREATMENT: Off Pathway: Gemcitabine + Cisplatin every 21 days TREATMENT RESPONSE: Partial Response (PR)  START OFF PATHWAY REGIMEN - Bladder   [id number]:Pembrolizumab 200 mg IV D1 q21 Days:   A cycle is every 21 days:     Pembrolizumab   **Always confirm dose/schedule in your pharmacy ordering system**  Patient Characteristics: Advanced/Metastatic Disease, Third Line and Beyond Therapeutic Status: Advanced/Metastatic Disease Line of Therapy: Third Line and Beyond  Intent of Therapy: Non-Curative / Palliative Intent, Discussed with Patient

## 2020-10-09 NOTE — Progress Notes (Signed)
Hematology and Oncology Follow Up Visit  CARVER MURAKAMI [id number] Dec 05, 1955 65 y.o. 10/09/2020   Principle Diagnosis:   Metastatic bladder cancer-localized disease  Extensive thrombus of the left leg-status post thrombectomy and iliac stent  Current Therapy:    Curative radiation with weekly cis-platinum-status post 6 cycle of weekly cis-platinum  --  Completed 04/08/2020   dd MVAC -- s/p cycle #3 -- started on 06/02/2020  --DC on 07/28/2020 secondary to toxicity  Eliquis 5 mg p.o. twice daily  Cis-platinum/Gemzar-s/p cycle #3 - start on 08/05/2020 - d/c on 10/08/2020  Pembrolizumab 200 mg/m2 IV q 3 week -- start on 10/14/2020     Interim History:  Mr. Sheils is back for a follow-up.  He actually was seen down at [facility].  He had a very good meeting down with several doctors at [facility].  He went down to see the spinal surgeons to see if they might be able to help out with the issues that he has had with spinal metastasis.  His last scans that were done show that he basically had a stable disease.  The MRI that he had done on 09/29/2020 showed stable appearance of the left retroperitoneal disease involving the lumbosacral junction, lumbar plexus and iliopsoas musculature.  The PET scan that he had done on 09/29/2020 showed the retroperitoneal mass measuring 3 x 2.4 cm.  The SUV was 12.3.  He saw a radiation oncology.  They did not feel they can do any further radiation therapy for him.  I think the next step now is to try immunotherapy.  He has not yet had immunotherapy.  I think this would certainly be worthwhile to try.  I think that pembrolizumab would be a reasonable option.  It would try to get him off chemotherapy for a while.  I think he is had a tough time with chemotherapy.  He is still having pain issues.  He is doing well with his pain regimen.  He is trying to stay active.  He is eating okay.  Is having no problems with nausea or vomiting.  He is quite anemic.  Hemoglobin  is 8.6.  He has had iron deficiency.  Back in early April, his ferritin was 244 with an iron saturation of 17%.  He did get a dose of IV iron.  He is on Eliquis for the extensive DVT in the left leg.  The left leg also looks quite good.  He has had no bleeding from the Eliquis.  The thrombus seems to be doing nicely.  He has responded very well.  He has had incredibly aggressive therapy for this thrombus.  Currently, he is quite constipated.  He is on Dulcolax.  I told him that we could certainly give him something a little more stronger if he needed to have something stronger.  Overall, I would say his performance status is ECOG 1.      Medications:  Current Outpatient Medications:  .  apixaban (ELIQUIS) 5 MG TABS tablet, TAKE 1 TABLET (5 MG TOTAL) BY MOUTH 2 (TWO) TIMES DAILY., Disp: 60 tablet, Rfl: 5 .  ASPIRIN ADULT LOW STRENGTH 81 MG EC tablet, TAKE 1 TABLET BY MOUTH EVERY DAY (Patient taking differently: Take 81 mg by mouth daily.), Disp: 30 tablet, Rfl: 11 .  Calcium Carbonate-Vit D-Min (CALTRATE PLUS PO), Take 1 tablet by mouth daily., Disp: , Rfl:  .  dexamethasone (DECADRON) 4 MG tablet, TAKE TWO TABLETS (8 MG TOTAL) BY MOUTH DAILY WITH FOOD FOR THREE DAYS  STARTING THE DAY AFTER CISPLATIN CHEMOTHERAPY., Disp: 30 tablet, Rfl: 1 .  fentaNYL (DURAGESIC) 50 MCG/HR, PLACE 1 PATCH ONTO THE SKIN EVERY 3 (THREE) DAYS., Disp: 10 patch, Rfl: 0 .  gabapentin (NEURONTIN) 600 MG tablet, TAKE 1 TABLET (600 MG TOTAL) BY MOUTH IN THE MORNING, AT NOON, IN THE EVENING, AND AT BEDTIME., Disp: 120 tablet, Rfl: 3 .  Lactulose 20 GM/30ML SOLN, Take 30 mLs (20 g total) by mouth every 4 (four) hours as needed., Disp: 450 mL, Rfl: 2 .  lactulose, encephalopathy, (CHRONULAC) 10 GM/15ML SOLN, TAKE 30MLS BY MOUTH EVERY 4 HOURS AS NEEDED, Disp: 450 mL, Rfl: 2 .  LORazepam (ATIVAN) 0.5 MG tablet, TAKE 1 TABLET (0.5 MG TOTAL) BY MOUTH EVERY 6 (SIX) HOURS AS NEEDED FOR ANXIETY., Disp: 90 tablet, Rfl: 1 .  meloxicam  (MOBIC) 15 MG tablet, TAKE 1 TABLET (15 MG TOTAL) BY MOUTH DAILY., Disp: 30 tablet, Rfl: 3 .  Multiple Vitamins-Minerals (MULTIVITAMIN WITH MINERALS) tablet, Take 1 tablet by mouth daily., Disp: , Rfl:  .  ondansetron (ZOFRAN-ODT) 8 MG disintegrating tablet, TAKE 1 TABLET BY MOUTH EVERY 8 HOURS AS NEEDED FOR NAUSEA & VOMITING, Disp: 30 tablet, Rfl: 0 .  Oxycodone HCl 10 MG TABS, TAKE 1 TABLET BY MOUTH EVERY 4 HOURS AS NEEDED, Disp: 90 tablet, Rfl: 0 .  pantoprazole (PROTONIX) 40 MG tablet, TAKE 1 TABLET BY MOUTH ONCE DAILY, Disp: 30 tablet, Rfl: 3 .  prochlorperazine (COMPAZINE) 10 MG tablet, Take 1 tablet (10 mg total) by mouth every 6 (six) hours as needed (Nausea or vomiting)., Disp: 30 tablet, Rfl: 1 .  prochlorperazine (COMPAZINE) 10 MG tablet, TAKE 1 TABLET (10 MG TOTAL) BY MOUTH EVERY 6 (SIX) HOURS AS NEEDED FOR NAUSEA OR VOMITING., Disp: 30 tablet, Rfl: 1 .  sodium chloride (OCEAN) 0.65 % SOLN nasal spray, Place 1 spray into both nostrils daily as needed for congestion (Allergies)., Disp: , Rfl:  .  zolpidem (AMBIEN) 10 MG tablet, TAKE 1 TABLET (10 MG TOTAL) BY MOUTH AT BEDTIME AS NEEDED FOR SLEEP., Disp: 30 tablet, Rfl: 0  Allergies: No Known Allergies  Past Medical History, Surgical history, Social history, and Family History were reviewed and updated.  Review of Systems: Review of Systems  Constitutional: Negative.   HENT:  Negative.   Eyes: Negative.   Respiratory: Negative.   Cardiovascular: Positive for leg swelling.  Gastrointestinal: Negative.   Endocrine: Negative.   Musculoskeletal: Positive for flank pain.  Skin: Negative.   Neurological: Negative.   Hematological: Negative.   Psychiatric/Behavioral: Negative.     Physical Exam:  height is 6' (1.829 m) and weight is 164 lb 12.8 oz (74.8 kg). His oral temperature is 98.9 F (37.2 C). His blood pressure is 113/69 and his pulse is 79. His respiration is 18 and oxygen saturation is 98%.   Wt Readings from Last 3  Encounters:  10/09/20 164 lb 12.8 oz (74.8 kg)  09/16/20 168 lb 1.9 oz (76.3 kg)  08/26/20 163 lb 12.8 oz (74.3 kg)    Physical Exam Vitals reviewed.  HENT:     Head: Normocephalic and atraumatic.  Eyes:     Pupils: Pupils are equal, round, and reactive to light.  Cardiovascular:     Rate and Rhythm: Normal rate and regular rhythm.     Heart sounds: Normal heart sounds.  Pulmonary:     Effort: Pulmonary effort is normal.     Breath sounds: Normal breath sounds.  Abdominal:     General: Bowel  sounds are normal.     Palpations: Abdomen is soft.  Musculoskeletal:        General: No tenderness or deformity. Normal range of motion.     Cervical back: Normal range of motion.     Comments: Extremities shows minimal swelling of the left leg.  He has good range of motion of the leg.  He has good pulses in his distal extremities.  The right leg is unremarkable.  Lymphadenopathy:     Cervical: No cervical adenopathy.  Skin:    General: Skin is warm and dry.     Findings: No erythema or rash.  Neurological:     Mental Status: He is alert and oriented to person, place, and time.  Psychiatric:        Behavior: Behavior normal.        Thought Content: Thought content normal.        Judgment: Judgment normal.    Lab Results  Component Value Date   WBC 7.2 10/09/2020   HGB 8.6 (L) 10/09/2020   HCT 27.1 (L) 10/09/2020   MCV 102.3 (H) 10/09/2020   PLT 313 10/09/2020     Chemistry      Component Value Date/Time   NA 137 10/09/2020 0840   K 4.4 10/09/2020 0840   CL 103 10/09/2020 0840   CO2 29 10/09/2020 0840   BUN 26 (H) 10/09/2020 0840   CREATININE 1.37 (H) 10/09/2020 0840      Component Value Date/Time   CALCIUM 9.8 10/09/2020 0840   ALKPHOS 44 10/09/2020 0840   AST 11 (L) 10/09/2020 0840   ALT 9 10/09/2020 0840   BILITOT 0.2 (L) 10/09/2020 0840      Impression and Plan: Mr. Capano is a really nice 65 year old white male.  He has a very interesting problem.  He had in  situ bladder cancer about 5 years ago.  Now, he has a recurrence that I would consider metastatic.  This is outside of the bladder.  It is not operable as it is appear to be invading to the psoas muscle and also involving his lower spine.  He completed initial radiation and chemotherapy.  He did very well with this.  He had a very nice response by PET scan and by MRI.  We will now move him over to immunotherapy.  Again, I think this would be a very reasonable way of trying to try to treat this.  Hopefully, we will see response with immunotherapy so we can keep him off chemotherapy for a while.  We will try him on pembrolizumab.  We will do the every 3-week dosing for right now.  I probably would have to give him 4 cycles of pembrolizumab and then repeat his scans to see how everything looks.  Sometimes with immunotherapy, it can take a while before we see a response.  Hopefully, his quality of life will continue to be decent.  He is likes to work.  He is incredibly active and busy with work.  I will plan to start treatment next week.  I will see him back when he has a second cycle in late May.    Volanda Napoleon, MD 4/29/20229:14 AM

## 2020-10-09 NOTE — Progress Notes (Signed)
Patient was seen at [facility] earlier this week. He states they suggest transitioning to immunotherapy as opposed to chemotherapy. Reviewed with patient how we will be able to review [facility]'s notes and recommendations and likely be able to administer his treatment here. Patient was happy to hear this as our location is closer to home and easier to manage.   Dr Marin Olp will change his treatment plan and appointments will be made once authorization obtained.  Oncology Nurse Navigator Documentation  Oncology Nurse Navigator Flowsheets 10/09/2020  Abnormal Finding Date -  Confirmed Diagnosis Date -  Diagnosis Status -  Planned Course of Treatment -  Phase of Treatment -  Chemotherapy Pending- Reason: -  Chemo/Radiation Concurrent Actual Start Date: -  Navigator Follow Up Date: 10/13/2020  Navigator Follow Up Reason: Appointment Review  Navigator Location [facility]  Referral Date to RadOnc/MedOnc -  Navigator Encounter Type Follow-up Appt  Telephone -  Treatment Initiated Date -  Patient Visit Type MedOnc  Treatment Phase Active Tx  Barriers/Navigation Needs Coordination of Care;Education  Education Other  Interventions Coordination of Care;Education;Psycho-Social Support  Acuity Level 2-Minimal Needs (1-2 Barriers Identified)  Referrals -  Coordination of Care Other  Education Method Verbal  Support Groups/Services Friends and Family  Time Spent with Patient 78

## 2020-10-09 NOTE — Patient Instructions (Signed)
Tunneled Central Venous Catheter Flushing Guide  It is important to flush your tunneled central venous catheter each time you use it, both before and after you use it. Flushing your catheter will help prevent it from clogging. What are the risks? Risks may include:  Infection.  Air getting into the catheter and bloodstream. Supplies needed:  A clean pair of gloves.  A disinfecting wipe. Use an alcohol wipe, chlorhexidine wipe, or iodine wipe as told by your health care provider.  A 10 mL syringe that has been prefilled with saline solution.  An empty 10 mL syringe, if a substance called heparin was injected into your catheter. How to flush your catheter When you flush your catheter, make sure you follow any specific instructions from your health care provider or the manufacturer. These are general guidelines. Flushing your catheter before use If there is heparin in your catheter: 1. Wash your hands with soap and water. 2. Put on gloves. 3. Scrub the injection cap for a minimum of 15 seconds with a disinfecting wipe. 4. Unclamp the catheter. 5. Attach the empty syringe to the injection cap. 6. Pull the syringe plunger back and withdraw 10 mL of blood. 7. Place the syringe into an appropriate waste container. 8. Scrub the injection cap for 15 seconds with a disinfecting wipe. 9. Attach the prefilled syringe to the injection cap. 10. Flush the catheter by pushing the plunger forward until all the liquid from the syringe is in the catheter. 11. Remove the syringe from the injection cap. 12. Clamp the catheter. If there is no heparin in your catheter: 1. Wash your hands with soap and water. 2. Put on gloves. 3. Scrub the injection cap for 15 seconds with a disinfecting wipe. 4. Unclamp the catheter. 5. Attach the prefilled syringe to the injection cap. 6. Flush the catheter by pushing the plunger forward until 5 mL of the liquid from the syringe is in the catheter. 7. Pull back on  the syringe until you see blood in the catheter. 8. If you have been asked to collect any blood, follow your health care provider's instructions. Otherwise, flush the catheter with the rest of the solution from the syringe. 9. Remove the syringe from the injection cap. 10. Clamp the catheter.   Flushing your catheter after use 1. Wash your hands with soap and water. 2. Put on gloves. 3. Scrub the injection cap for 15 seconds with a disinfecting wipe. 4. Unclamp the catheter. 5. Attach the prefilled syringe to the injection cap. 6. Flush the catheter by pushing the plunger forward until all of the liquid from the syringe is in the catheter. 7. Remove the syringe from the injection cap. 8. Clamp the catheter. Problems and solutions  If blood cannot be completely cleared from the injection cap, you may need to have the injection cap replaced.  If the catheter is difficult to flush, use the pulsing method. The pulsing method involves pushing only a few milliliters of solution into the catheter at a time and pausing between pushes.  If you do not see blood in the catheter when you pull back on the syringe, change your body position, such as by raising your arms above your head. Take a deep breath and cough. Then, pull back on the syringe. If you still do not see blood, flush the catheter with a small amount of solution. Then, change positions again and take a breath or cough. Pull back on the syringe again. If you still do not   see blood, finish flushing the catheter and contact your health care provider. Do not use your catheter until your health care provider says it is okay. General tips  Have someone help you flush your catheter, if possible.  Do not force fluid through your catheter.  Do not use a syringe that is larger or smaller than 10 mL. Using a smaller syringe can make the catheter burst.  Do not use your catheter without flushing it first if it has heparin in it. Contact a health  care provider if:  You cannot see any blood in the catheter when you flush it before using it.  Your catheter is difficult to flush. Get help right away if:  You cannot flush the catheter.  The catheter leaks when you flush it or when there is fluid in it.  There are cracks or breaks in the catheter. Summary  It is important to flush your tunneled central venous catheter each time you use it, both before and after you use it.  Scrub the injection cap for 15 seconds with a disinfecting wipe before and after you flush it.  When you flush your catheter, make sure you follow any specific instructions from your health care provider or the manufacturer.  Get help right away if you cannot flush the catheter. This information is not intended to replace advice given to you by your health care provider. Make sure you discuss any questions you have with your health care provider. Document Revised: 08/08/2019 Document Reviewed: 08/15/2018 Elsevier Patient Education  2021 [employer]  

## 2020-10-10 ENCOUNTER — Other Ambulatory Visit: Payer: Self-pay | Admitting: Cardiology

## 2020-10-10 DIAGNOSIS — H34232 Retinal artery branch occlusion, left eye: Secondary | ICD-10-CM

## 2020-10-10 LAB — ERYTHROPOIETIN: Erythropoietin: 43.8 m[IU]/mL — ABNORMAL HIGH (ref 2.6–18.5)

## 2020-10-10 LAB — IGG, IGA, IGM
IgA: 203 mg/dL (ref 61–437)
IgG (Immunoglobin G), Serum: 1089 mg/dL (ref 603–1613)
IgM (Immunoglobulin M), Srm: 60 mg/dL (ref 20–172)

## 2020-10-12 ENCOUNTER — Telehealth: Payer: Self-pay

## 2020-10-12 LAB — KAPPA/LAMBDA LIGHT CHAINS
Kappa free light chain: 43.1 mg/L — ABNORMAL HIGH (ref 3.3–19.4)
Kappa, lambda light chain ratio: 1.7 — ABNORMAL HIGH (ref 0.26–1.65)
Lambda free light chains: 25.3 mg/L (ref 5.7–26.3)

## 2020-10-12 NOTE — Telephone Encounter (Signed)
Pt already on sch for 10/14/20. 11/03/20 appts made per 10/09/20 los and pt will gain this sch through my chart and at 5/4 appt    Brian Benton

## 2020-10-13 ENCOUNTER — Encounter: Payer: Self-pay | Admitting: *Deleted

## 2020-10-13 NOTE — Progress Notes (Signed)
Patient is scheduled to start new treatment regimen 10/14/2020.   Oncology Nurse Navigator Documentation  Oncology Nurse Navigator Flowsheets 10/13/2020  Abnormal Finding Date -  Confirmed Diagnosis Date -  Diagnosis Status -  Planned Course of Treatment -  Phase of Treatment -  Chemotherapy Pending- Reason: -  Chemo/Radiation Concurrent Actual Start Date: -  Navigator Follow Up Date: 11/03/2020  Navigator Follow Up Reason: Follow-up Appointment;Chemotherapy  Navigator Location [facility]  Referral Date to RadOnc/MedOnc -  Navigator Encounter Type Appt/Treatment Plan Review  Telephone -  Treatment Initiated Date -  Patient Visit Type MedOnc  Treatment Phase Active Tx  Barriers/Navigation Needs Coordination of Care;Education  Education -  Interventions None Required  Acuity Level 2-Minimal Needs (1-2 Barriers Identified)  Referrals -  Coordination of Care -  Education Method -  Support Groups/Services Friends and Family  Time Spent with Patient 15

## 2020-10-14 ENCOUNTER — Encounter: Payer: Self-pay | Admitting: *Deleted

## 2020-10-14 ENCOUNTER — Inpatient Hospital Stay: Payer: 59

## 2020-10-14 ENCOUNTER — Other Ambulatory Visit: Payer: Self-pay

## 2020-10-14 ENCOUNTER — Inpatient Hospital Stay: Payer: 59 | Attending: Hematology & Oncology

## 2020-10-14 VITALS — BP 138/81 | HR 75 | Temp 98.3°F | Resp 17

## 2020-10-14 DIAGNOSIS — C679 Malignant neoplasm of bladder, unspecified: Secondary | ICD-10-CM | POA: Diagnosis present

## 2020-10-14 DIAGNOSIS — Z5112 Encounter for antineoplastic immunotherapy: Secondary | ICD-10-CM | POA: Diagnosis present

## 2020-10-14 DIAGNOSIS — Z79899 Other long term (current) drug therapy: Secondary | ICD-10-CM | POA: Insufficient documentation

## 2020-10-14 DIAGNOSIS — C799 Secondary malignant neoplasm of unspecified site: Secondary | ICD-10-CM | POA: Insufficient documentation

## 2020-10-14 LAB — CMP (CANCER CENTER ONLY)
ALT: 10 U/L (ref 0–44)
AST: 13 U/L — ABNORMAL LOW (ref 15–41)
Albumin: 3.8 g/dL (ref 3.5–5.0)
Alkaline Phosphatase: 56 U/L (ref 38–126)
Anion gap: 8 (ref 5–15)
BUN: 17 mg/dL (ref 8–23)
CO2: 28 mmol/L (ref 22–32)
Calcium: 9.9 mg/dL (ref 8.9–10.3)
Chloride: 101 mmol/L (ref 98–111)
Creatinine: 1.23 mg/dL (ref 0.61–1.24)
GFR, Estimated: >60 mL/min (ref >60)
Glucose, Bld: 133 mg/dL — ABNORMAL HIGH (ref 70–99)
Potassium: 4 mmol/L (ref 3.5–5.1)
Sodium: 137 mmol/L (ref 135–145)
Total Bilirubin: 0.3 mg/dL (ref 0.3–1.2)
Total Protein: 6.8 g/dL (ref 6.5–8.1)

## 2020-10-14 LAB — CBC WITH DIFFERENTIAL (CANCER CENTER ONLY)
Abs Immature Granulocytes: 0.04 10*3/uL (ref 0.00–0.07)
Basophils Absolute: 0.1 10*3/uL (ref 0.0–0.1)
Basophils Relative: 1 %
Eosinophils Absolute: 0.4 10*3/uL (ref 0.0–0.5)
Eosinophils Relative: 4 %
HCT: 28.5 % — ABNORMAL LOW (ref 39.0–52.0)
Hemoglobin: 9.3 g/dL — ABNORMAL LOW (ref 13.0–17.0)
Immature Granulocytes: 1 %
Lymphocytes Relative: 8 %
Lymphs Abs: 0.7 10*3/uL (ref 0.7–4.0)
MCH: 32.6 pg (ref 26.0–34.0)
MCHC: 32.6 g/dL (ref 30.0–36.0)
MCV: 100 fL (ref 80.0–100.0)
Monocytes Absolute: 1.1 10*3/uL — ABNORMAL HIGH (ref 0.1–1.0)
Monocytes Relative: 13 %
Neutro Abs: 6.4 10*3/uL (ref 1.7–7.7)
Neutrophils Relative %: 73 %
Platelet Count: 379 10*3/uL (ref 150–400)
RBC: 2.85 MIL/uL — ABNORMAL LOW (ref 4.22–5.81)
RDW: 18.3 % — ABNORMAL HIGH (ref 11.5–15.5)
WBC Count: 8.8 10*3/uL (ref 4.0–10.5)
nRBC: 0 % (ref 0.0–0.2)

## 2020-10-14 LAB — IRON AND TIBC
Iron: 54 ug/dL (ref 45–182)
Saturation Ratios: 20 % (ref 17.9–39.5)
TIBC: 276 ug/dL (ref 250–450)
UIBC: 222 ug/dL

## 2020-10-14 LAB — FERRITIN: Ferritin: 235 ng/mL (ref 24–336)

## 2020-10-14 LAB — SAMPLE TO BLOOD BANK

## 2020-10-14 MED ORDER — PROCHLORPERAZINE MALEATE 10 MG PO TABS: 10.0000 mg | ORAL_TABLET | Freq: Four times a day (QID) | ORAL | 1 refills | Status: DC | PRN

## 2020-10-14 MED ORDER — LORAZEPAM 0.5 MG PO TABS: 0.5000 mg | ORAL_TABLET | Freq: Four times a day (QID) | ORAL | 0 refills | Status: DC | PRN

## 2020-10-14 MED ORDER — SODIUM CHLORIDE 0.9 % IV SOLN
200.0000 mg | Freq: Once | INTRAVENOUS | Status: AC
  Administered 2020-10-14: 200 mg via INTRAVENOUS
  Filled 2020-10-14: qty 8

## 2020-10-14 MED ORDER — ONDANSETRON HCL 8 MG PO TABS: 8.0000 mg | ORAL_TABLET | Freq: Two times a day (BID) | ORAL | 1 refills | Status: DC | PRN

## 2020-10-14 MED ORDER — HEPARIN SOD (PORK) LOCK FLUSH 100 UNIT/ML IV SOLN
500.0000 [IU] | Freq: Once | INTRAVENOUS | Status: AC | PRN
  Administered 2020-10-14: 500 [IU]
  Filled 2020-10-14: qty 5

## 2020-10-14 MED ORDER — SODIUM CHLORIDE 0.9% FLUSH
10.0000 mL | INTRAVENOUS | Status: DC | PRN
  Administered 2020-10-14: 10 mL
  Filled 2020-10-14: qty 10

## 2020-10-14 MED ORDER — LIDOCAINE-PRILOCAINE 2.5-2.5 % EX CREA: TOPICAL_CREAM | CUTANEOUS | 3 refills | Status: DC

## 2020-10-14 MED ORDER — SODIUM CHLORIDE 0.9 % IV SOLN
Freq: Once | INTRAVENOUS | Status: AC
  Filled 2020-10-14: qty 250

## 2020-10-14 NOTE — Progress Notes (Signed)
Patient started a new treatment today and had some question regarding his medication. Attempted to call patient and his wife but did not make contact and neither has voice mail set up.  Patient left message on nurse line stating he was unavailable via phone and to answer his question regarding steroids through [place]. Message sent to patient.   Oncology Nurse Navigator Documentation  Oncology Nurse Navigator Flowsheets 10/14/2020  Abnormal Finding Date -  Confirmed Diagnosis Date -  Diagnosis Status -  Planned Course of Treatment -  Phase of Treatment -  Chemotherapy Pending- Reason: -  Chemo/Radiation Concurrent Actual Start Date: -  Navigator Follow Up Date: 11/03/2020  Navigator Follow Up Reason: Follow-up Appointment;Chemotherapy  Navigator Location [facility]  Referral Date to RadOnc/MedOnc -  Navigator Encounter Type MyChart;Telephone  Telephone Outgoing Call  Treatment Initiated Date -  Patient Visit Type MedOnc  Treatment Phase Active Tx  Barriers/Navigation Needs Coordination of Care;Education  Education Other  Interventions Education  Acuity Level 2-Minimal Needs (1-2 Barriers Identified)  Referrals -  Coordination of Care -  Education Method Written  Support Groups/Services Friends and Family  Time Spent with Patient 30

## 2020-10-14 NOTE — Patient Instructions (Signed)
[facility]  Discharge Instructions: Thank you for choosing [facility] to provide your oncology and hematology care.   If you have a lab appointment with the Cancer Center, please go directly to the Cancer Center and check in at the registration area.  Wear comfortable clothing and clothing appropriate for easy access to any Portacath or PICC line.   We strive to give you quality time with your provider. You may need to reschedule your appointment if you arrive late (15 or more minutes).  Arriving late affects you and other patients whose appointments are after yours.  Also, if you miss three or more appointments without notifying the office, you may be dismissed from the clinic at the provider's discretion.      For prescription refill requests, have your pharmacy contact our office and allow 72 hours for refills to be completed.    Today you received the following chemotherapy and/or immunotherapy agents keytruda    To help prevent nausea and vomiting after your treatment, we encourage you to take your nausea medication as directed.  BELOW ARE SYMPTOMS THAT SHOULD BE REPORTED IMMEDIATELY: *FEVER GREATER THAN 100.4 F (38 C) OR HIGHER *CHILLS OR SWEATING *NAUSEA AND VOMITING THAT IS NOT CONTROLLED WITH YOUR NAUSEA MEDICATION *UNUSUAL SHORTNESS OF BREATH *UNUSUAL BRUISING OR BLEEDING *URINARY PROBLEMS (pain or burning when urinating, or frequent urination) *BOWEL PROBLEMS (unusual diarrhea, constipation, pain near the anus) TENDERNESS IN MOUTH AND THROAT WITH OR WITHOUT PRESENCE OF ULCERS (sore throat, sores in mouth, or a toothache) UNUSUAL RASH, SWELLING OR PAIN  UNUSUAL VAGINAL DISCHARGE OR ITCHING   Items with * indicate a potential emergency and should be followed up as soon as possible or go to the Emergency Department if any problems should occur.  Please show the CHEMOTHERAPY ALERT CARD or IMMUNOTHERAPY ALERT CARD at check-in to the  Emergency Department and triage nurse. Should you have questions after your visit or need to cancel or reschedule your appointment, please contact [facility]  [phone] and follow the prompts.  Office hours are 8:00 a.m. to 4:30 p.m. Monday - Friday. Please note that voicemails left after 4:00 p.m. may not be returned until the following business day.  We are closed weekends and major holidays. You have access to a nurse at all times for urgent questions. Please call the main number to the clinic [phone] and follow the prompts.  For any non-urgent questions, you may also contact your provider using MyChart. We now offer e-Visits for anyone 18 and older to request care online for non-urgent symptoms. For details visit [email or web address].   Also download the MyChart app! Go to the app store, search "MyChart", open the app, select [facility], and log in with your MyChart username and password.  Due to Covid, a mask is required upon entering the hospital/clinic. If you do not have a mask, one will be given to you upon arrival. For doctor visits, patients may have 1 support person aged 18 or older with them. For treatment visits, patients cannot have anyone with them due to current Covid guidelines and our immunocompromised population.  

## 2020-10-14 NOTE — Patient Instructions (Signed)

## 2020-10-15 ENCOUNTER — Other Ambulatory Visit (HOSPITAL_BASED_OUTPATIENT_CLINIC_OR_DEPARTMENT_OTHER): Payer: Self-pay

## 2020-10-15 ENCOUNTER — Encounter: Payer: Self-pay | Admitting: *Deleted

## 2020-10-15 ENCOUNTER — Other Ambulatory Visit: Payer: Self-pay | Admitting: *Deleted

## 2020-10-15 DIAGNOSIS — R222 Localized swelling, mass and lump, trunk: Secondary | ICD-10-CM

## 2020-10-15 MED ORDER — PREDNISONE 10 MG PO TABS
ORAL_TABLET | ORAL | 0 refills | Status: DC
  Filled 2020-10-15: qty 21, 6d supply, fill #0

## 2020-10-15 NOTE — Progress Notes (Signed)
Patient c/o severe pain to lumbar region down his left leg to knee. The pain is constant, however increases in severity at night when he lays down. Last evening he couldn't fall asleep until 5a due to pain. Pain started last Friday after physical therapy and he wonders if he pinched a nerve.   Patient is taking gabapentin around the clock, oxycodone as prescribed and has a 65mcg fentanyl patch.  Spoke to Dr Marin Olp. He would like patient to take a prednisone dose pack to see if pain can be managed.   Patient notified and pharmacy confirmed.   Oncology Nurse Navigator Documentation  Oncology Nurse Navigator Flowsheets 10/15/2020  Abnormal Finding Date -  Confirmed Diagnosis Date -  Diagnosis Status -  Planned Course of Treatment -  Phase of Treatment -  Chemotherapy Pending- Reason: -  Chemo/Radiation Concurrent Actual Start Date: -  Navigator Follow Up Date: 11/03/2020  Navigator Follow Up Reason: Follow-up Appointment;Chemotherapy  Navigator Location [facility]  Referral Date to RadOnc/MedOnc -  Navigator Encounter Type Telephone  Telephone Symptom Mgt;Incoming Call  Treatment Initiated Date -  Patient Visit Type MedOnc  Treatment Phase Active Tx  Barriers/Navigation Needs Coordination of Care;Education  Education Pain/ Symptom Management  Interventions Coordination of Care;Education;Psycho-Social Support;Medication Assistance  Acuity Level 2-Minimal Needs (1-2 Barriers Identified)  Referrals -  Coordination of Care Other  Education Method Verbal  Support Groups/Services Friends and Family  Time Spent with Patient 37

## 2020-10-16 ENCOUNTER — Ambulatory Visit: Payer: 59 | Admitting: Physical Therapy

## 2020-10-19 ENCOUNTER — Other Ambulatory Visit (HOSPITAL_BASED_OUTPATIENT_CLINIC_OR_DEPARTMENT_OTHER): Payer: Self-pay

## 2020-10-21 ENCOUNTER — Encounter: Payer: Self-pay | Admitting: *Deleted

## 2020-10-21 DIAGNOSIS — R222 Localized swelling, mass and lump, trunk: Secondary | ICD-10-CM

## 2020-10-21 DIAGNOSIS — C799 Secondary malignant neoplasm of unspecified site: Secondary | ICD-10-CM

## 2020-10-21 NOTE — Progress Notes (Signed)
Received a call from patient's wife, Brian Benton. She would like to give patient hydrotherapy for his birthday and has found a location in [place] which would be able to provide this. She asks if Dr Marin Olp would send a referral for patient.   Spoke to Dr Marin Olp and he is okay with sending referral.   Referral sent to  [facility] [place] [id number] [phone]  Referral faxed.  Oncology Nurse Navigator Documentation  Oncology Nurse Navigator Flowsheets 10/21/2020  Abnormal Finding Date -  Confirmed Diagnosis Date -  Diagnosis Status -  Planned Course of Treatment -  Phase of Treatment -  Chemotherapy Pending- Reason: -  Chemo/Radiation Concurrent Actual Start Date: -  Navigator Follow Up Date: 11/03/2020  Navigator Follow Up Reason: Follow-up Appointment  Navigator Location [facility]  Referral Date to RadOnc/MedOnc -  Navigator Encounter Type Telephone  Telephone Incoming Call;Appt Confirmation/Clarification  Treatment Initiated Date -  Patient Visit Type MedOnc  Treatment Phase Active Tx  Barriers/Navigation Needs Coordination of Care;Education  Education Other  Interventions Referrals;Psycho-Social Support;Education  Acuity Level 2-Minimal Needs (1-2 Barriers Identified)  Referrals Physical Therapy  Coordination of Care PT  Education Method Verbal  Support Groups/Services Friends and Family  Time Spent with Patient 47

## 2020-10-22 ENCOUNTER — Encounter: Payer: Self-pay | Admitting: *Deleted

## 2020-10-22 ENCOUNTER — Other Ambulatory Visit (HOSPITAL_BASED_OUTPATIENT_CLINIC_OR_DEPARTMENT_OTHER): Payer: Self-pay

## 2020-10-22 ENCOUNTER — Other Ambulatory Visit: Payer: Self-pay | Admitting: Hematology & Oncology

## 2020-10-22 MED ORDER — ZOLPIDEM TARTRATE 10 MG PO TABS
ORAL_TABLET | Freq: Every evening | ORAL | 0 refills | Status: DC | PRN
Start: 2020-10-22 — End: 2020-11-18
  Filled 2020-10-22 – 2020-10-30 (×3): qty 30, 30d supply, fill #0

## 2020-10-22 MED ORDER — PREDNISONE 20 MG PO TABS
ORAL_TABLET | ORAL | 0 refills | Status: DC
  Filled 2020-10-22: qty 21, 14d supply, fill #0

## 2020-10-22 MED ORDER — FENTANYL 50 MCG/HR TD PT72
MEDICATED_PATCH | TRANSDERMAL | 0 refills | Status: DC
  Filled 2020-10-22 – 2020-10-30 (×5): qty 10, 30d supply, fill #0

## 2020-10-22 NOTE — Progress Notes (Signed)
Patient calls with continued c/o pain. He finished his course of steroids yesterday, and by the evening his pain had returned to severe levels. His pain medication doesn't seem to be effective in decreasing his pain.   Spoke to Dr Marin Olp. He would like patient to take prednisone 40mg  daily for 7 days. Followed by prednisone 20mg  daily for 7 days. By then, the patient will be seen in follow up.   Called patient and educated him to the new prescription. Confirmed pharmacy.  Oncology Nurse Navigator Documentation  Oncology Nurse Navigator Flowsheets 10/22/2020  Abnormal Finding Date -  Confirmed Diagnosis Date -  Diagnosis Status -  Planned Course of Treatment -  Phase of Treatment -  Chemotherapy Pending- Reason: -  Chemo/Radiation Concurrent Actual Start Date: -  Navigator Follow Up Date: 11/03/2020  Navigator Follow Up Reason: Follow-up Appointment  Navigator Location [facility]  Referral Date to RadOnc/MedOnc -  Navigator Encounter Type Telephone  Telephone Symptom Mgt;Incoming Call  Treatment Initiated Date -  Patient Visit Type MedOnc  Treatment Phase Active Tx  Barriers/Navigation Needs Coordination of Care;Education  Education Pain/ Symptom Management  Interventions Medication Assistance;Education;Psycho-Social Support  Acuity Level 2-Minimal Needs (1-2 Barriers Identified)  Referrals -  Coordination of Care -  Education Method Verbal  Support Groups/Services Friends and Family  Time Spent with Patient 30

## 2020-10-23 ENCOUNTER — Other Ambulatory Visit (HOSPITAL_BASED_OUTPATIENT_CLINIC_OR_DEPARTMENT_OTHER): Payer: Self-pay

## 2020-10-26 ENCOUNTER — Other Ambulatory Visit (HOSPITAL_BASED_OUTPATIENT_CLINIC_OR_DEPARTMENT_OTHER): Payer: Self-pay

## 2020-10-26 MED FILL — Apixaban Tab 5 MG: ORAL | 30 days supply | Qty: 60 | Fill #1 | Status: AC

## 2020-10-27 ENCOUNTER — Other Ambulatory Visit (HOSPITAL_BASED_OUTPATIENT_CLINIC_OR_DEPARTMENT_OTHER): Payer: Self-pay

## 2020-10-28 ENCOUNTER — Other Ambulatory Visit (HOSPITAL_BASED_OUTPATIENT_CLINIC_OR_DEPARTMENT_OTHER): Payer: Self-pay

## 2020-10-30 ENCOUNTER — Other Ambulatory Visit: Payer: Self-pay

## 2020-10-30 ENCOUNTER — Ambulatory Visit: Payer: 59 | Attending: Interventional Radiology | Admitting: Physical Therapy

## 2020-10-30 ENCOUNTER — Other Ambulatory Visit (HOSPITAL_BASED_OUTPATIENT_CLINIC_OR_DEPARTMENT_OTHER): Payer: Self-pay

## 2020-10-30 ENCOUNTER — Encounter: Payer: Self-pay | Admitting: Physical Therapy

## 2020-10-30 DIAGNOSIS — R262 Difficulty in walking, not elsewhere classified: Secondary | ICD-10-CM | POA: Insufficient documentation

## 2020-10-30 DIAGNOSIS — I890 Lymphedema, not elsewhere classified: Secondary | ICD-10-CM | POA: Diagnosis present

## 2020-10-30 DIAGNOSIS — R29898 Other symptoms and signs involving the musculoskeletal system: Secondary | ICD-10-CM | POA: Diagnosis not present

## 2020-10-30 NOTE — Therapy (Signed)
[place], [place], [address] Phone: [phone]   Fax:  [phone]  Physical Therapy Treatment  Patient Details  Name: Brian Benton MRN: [id number] Date of Birth: [birth year] Referring Provider (PT): Dr. Earleen Newport   Encounter Date: 10/30/2020   PT End of Session - 10/30/20 1532    Visit Number 9    Number of Visits 9    PT Start Time 1000    PT Stop Time 1045    PT Time Calculation (min) 45 min    Activity Tolerance Patient limited by pain    Behavior During Therapy [facility] for tasks assessed/performed           Past Medical History:  Diagnosis Date  . Cancer ([place])    bladder  . History of bladder cancer    s/p  TURBT 07-25-2014  . Hyperlipidemia   . Mass of left hand   . 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 BLADDER SURGERY     [place]  . COLONOSCOPY    . CYSTECTOMY    . CYSTOSCOPY WITH BIOPSY N/A 07/25/2014   Procedure: CYSTOSCOPY WITH BLADDER BIOPSY AND FULGERATION;  Surgeon: [name], MD;  Location: [facility];  Service: Urology;  Laterality: N/A;  . CYSTOSCOPY WITH BIOPSY N/A 11/28/2014   Procedure: CYSTOSCOPY WITH  BLADDER BIOPSY;  Surgeon: [name], MD;  Location: [facility];  Service: Urology;  Laterality: N/A;  . HEMORRHOID SURGERY  03/15/2012   Procedure: HEMORRHOIDECTOMY;  Surgeon: [name], MD;  Location: [facility];  Service: General;  Laterality: N/A;  . HERNIA REPAIR    . INGUINAL HERNIA REPAIR Bilateral 2006  . INTRAVASCULAR ULTRASOUND/IVUS N/A 03/13/2020   Procedure: Intravascular Ultrasound/IVUS;  Surgeon: Elam Dutch, MD;  Location: [facility];  Service: Cardiovascular;  Laterality: N/A;  . IR IMAGING GUIDED PORT INSERTION  06/01/2020  . IR INTRAVASCULAR ULTRASOUND NON CORONARY  06/08/2020  . IR INTRAVASCULAR ULTRASOUND NON CORONARY  06/17/2020   . IR IVC FILTER PLMT / S&I /IMG GUID/MOD SED  06/08/2020  . IR PTA VENOUS EXCEPT DIALYSIS CIRCUIT  06/08/2020  . IR RADIOLOGIST EVAL & MGMT  04/30/2020  . IR RADIOLOGIST EVAL & MGMT  05/27/2020  . IR RADIOLOGIST EVAL & MGMT  07/21/2020  . IR TRANSCATH PLC STENT  INITIAL VEIN  INC ANGIOPLASTY  06/17/2020  . IR TRANSCATH PLC STENT 1ST ART NOT LE CV CAR VERT CAR  06/08/2020  . IR US GUIDE VASC ACCESS LEFT  06/08/2020  . IR US GUIDE VASC ACCESS LEFT  06/17/2020  . IR VENO/EXT/UNI LEFT  06/08/2020  . IR VENO/EXT/UNI LEFT  06/17/2020  . IVC VENOGRAPHY N/A 03/13/2020   Procedure: IVC Venography;  Surgeon: Elam Dutch, MD;  Location: [facility];  Service: Cardiovascular;  Laterality: N/A;  . LOWER EXTREMITY VENOGRAPHY Left 03/13/2020   Procedure: LOWER EXTREMITY VENOGRAPHY;  Surgeon: Elam Dutch, MD;  Location: [facility];  Service: Cardiovascular;  Laterality: Left;  Marland Kitchen MASS EXCISION Left 11/12/2018   Procedure: EXCISION MASS LEFT HAND;  Surgeon: Daryll Brod, MD;  Location: [facility];  Service: Orthopedics;  Laterality: Left;  FAB  . PERIPHERAL VASCULAR INTERVENTION  03/13/2020   Procedure: PERIPHERAL VASCULAR INTERVENTION;  Surgeon: Elam Dutch, MD;  Location: [facility];  Service: Cardiovascular;;  . PERIPHERAL VASCULAR THROMBECTOMY  N/A 03/13/2020   Procedure: PERIPHERAL VASCULAR THROMBECTOMY;  Surgeon: Elam Dutch, MD;  Location: [facility];  Service: Cardiovascular;  Laterality: N/A;  . RADIOLOGY WITH ANESTHESIA N/A 02/20/2020   Procedure: MRI LUMBAR W/O CONTRAST  WITH ANESTHESIA;  Surgeon: Radiologist, Medication, MD;  Location: [facility];  Service: Radiology;  Laterality: N/A;  . RADIOLOGY WITH ANESTHESIA N/A 06/08/2020   Procedure: IR WITH ANESTHESIA VENOGRAM;  Surgeon: Radiologist, Medication, MD;  Location: [facility];  Service: Radiology;  Laterality: N/A;  . TESTICLE SURGERY  2004   Ruptured Undescended Right testicle   . TRANSURETHRAL  RESECTION OF BLADDER TUMOR WITH GYRUS (TURBT-GYRUS) N/A 05/23/2014   Procedure: TRANSURETHRAL RESECTION OF BLADDER TUMOR WITH GYRUS (TURBT-GYRUS);  Surgeon: [name], MD;  Location: [facility];  Service: Urology;  Laterality: N/A;  . WISDOM TOOTH EXTRACTION      There were no vitals filed for this visit.   Subjective Assessment - 10/30/20 1524    Subjective Pt has started immunotherapy.  His biggest probem now is the pain he has from his lumbar area that shoots down his leg that he says is coming from swelling around the tumor that is pressing on his leg.  He does not feel that he has any more lymphedema in his leg and has not been able to use his Flexitouch much becasue it presses on his hip/leg and causes pain. He has not been exercising much. He is starting  physical therapty treatment at another center in town [facility] [employer]. He is able to exercise and the jets from the water are helping with his pain. Since he no longer has problems with lymphedema this will be his last session here.    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 He started chemo and radiation and had treatment to help stent the blood clot and circulation is improved. He wears wears thigh high and below knee compression hose since November 2021.  He now also wears compression running pants to help with the fullness and discomfort in is upper thigh and hip    Patient Stated Goals to get left leg stronger and to get rid of the leg pain    Currently in Pain? Yes    Pain Score 2     Pain Location Back    Pain Orientation Left    Pain Descriptors / Indicators Shooting    Pain Type Chronic pain    Pain Radiating Towards shoots down left  leg    Pain Onset More than a month ago    Pain Frequency Intermittent    Aggravating Factors  worse at night    Pain Relieving Factors hydortherapy tank                             [facility] Adult PT Treatment/Exercise - 10/30/20 0001      Knee/Hip Exercises: Stretches   Hip Flexor Stretch Limitations Pt unable to tolerate thomas test position due to increased pain    Piriformis Stretch Limitations low position with left ankle on right lower leg for stretch with no pain      Knee/Hip Exercises: Sidelying   Hip ABduction  AROM;Left;10 reps   with knee bent   Clams 10 reps on left sie    Other Sidelying Knee/Hip Exercises hip extension to stretch hip felxors      Manual Therapy   Soft tissue mobilization in right sidelying position, gently to low back as pt cannot tolerate much pressure with stationary circles toward upper back. the left arm abduction to streth left lateral trunk within pain range                       PT Long Term Goals - 10/30/20 1535      PT LONG TERM GOAL #1   Title Pt will report the pain in his left leg is decrased by 75%    Baseline Pt  left leg pain in increased    Status Not Met      PT LONG TERM GOAL #2   Title Pt will increase the number of sit to stands in 30 seconds to > 14 indicating an improvment in LE functional strength    Baseline 10 on 08/06/2020, 13 on 09/18/2020    Status Partially Met   limited by leg pain     PT LONG TERM GOAL #3   Title Pt will decrease his TUG score to < 8 seconds showing an improvment in functional mobility and decrease risk of fall    Baseline 10.9 sec on 08/05/2020, 9.16 sec on 09/18/2020    Status Partially Met   limited by leg pain     PT LONG TERM GOAL #4   Title Pt will increase the strength of his right hip adduction to 2+/5    Baseline 1/5 on 08/06/2020    Time 8    Period Weeks    Status Partially Met      PT LONG TERM GOAL #5   Title Pt will be independent in a home exericise  program focusing in strengthening the left leg and know how to progress it    Status Achieved      PT LONG TERM GOAL #6   Title Pt will report he knows how to manage his left leg lymphedema at home    Status Achieved                 Plan - 10/30/20 1532    Clinical Impression Statement Pt has developed more pain in his back and left leg that is limiting his ability to advance his exercise. He knows lots of exercises to do and tries to do what he can. He will be doing water therapy in a hydrotherapy tanks and is hopeful this will allow his to exercise better and help with his pain also.  He is no longer having problems with lymphedema but has the tools and knows how to use them at home should leg swelling become a problem for him He is ready to discharge from this episode of PT today    Personal Factors and Comorbidities Comorbidity 3+    Comorbidities lumbar metastatic lesion, chronic DVT, ongoing chemotherapy    Examination-Activity Limitations Locomotion Level;Stairs;Stand;Squat;Lift;Bend;Transfers    PT Next Visit Plan Discharge           Patient will benefit from skilled therapeutic intervention in order to improve the following deficits and impairments:  Abnormal gait,Decreased balance,Decreased endurance,Decreased mobility,Difficulty walking,Increased muscle spasms,Decreased range of motion,Decreased knowledge of precautions,Decreased activity tolerance,Decreased strength,Increased fascial restricitons,Impaired flexibility,Pain  Visit Diagnosis: Weakness of left leg  Lymphedema, not elsewhere classified  Difficulty in  walking, not elsewhere classified     Problem List Patient Active Problem List   Diagnosis Date Noted  . IDA (iron deficiency anemia) 09/22/2020  . 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 Leg swelling 03/02/2020  . Paraspinal mass 03/02/2020   . Acute deep vein thrombosis (DVT) of left lower extremity ([place]) 03/02/2020  . Essential hypertension 10/31/2018  . 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 Mixed hyperlipidemia 10/03/2018  . NEVUS, ATYPICAL 10/23/2007  . Tobacco abuse 02/21/2007   PHYSICAL THERAPY DISCHARGE SUMMARY  Visits from Start of Care: 9  Current functional level related to goals / functional outcomes: Lymphedema has resolved    Remaining deficits: Leg pain, weakness and difficulty walking    Education / Equipment: Home exercise, lymphedema management  Plan: Patient agrees to discharge.  Patient goals were partially met. Patient is being discharged due to the patient's request.  ?????    Brian Benton PT  Brian Benton 10/30/2020, 3:38 PM  [place], [place], [address] Phone: [phone]   Fax:  [phone]  Name: Brian Benton MRN: [id number] Date of Birth: [birth year]

## 2020-11-02 ENCOUNTER — Other Ambulatory Visit (HOSPITAL_BASED_OUTPATIENT_CLINIC_OR_DEPARTMENT_OTHER): Payer: Self-pay

## 2020-11-02 ENCOUNTER — Other Ambulatory Visit: Payer: Self-pay | Admitting: Hematology & Oncology

## 2020-11-02 MED ORDER — OXYCODONE HCL 10 MG PO TABS
ORAL_TABLET | ORAL | 0 refills | Status: DC | PRN
  Filled 2020-11-02: qty 90, 15d supply, fill #0

## 2020-11-03 ENCOUNTER — Inpatient Hospital Stay: Payer: 59

## 2020-11-03 ENCOUNTER — Encounter: Payer: Self-pay | Admitting: *Deleted

## 2020-11-03 ENCOUNTER — Encounter: Payer: Self-pay | Admitting: Hematology & Oncology

## 2020-11-03 ENCOUNTER — Telehealth: Payer: Self-pay

## 2020-11-03 ENCOUNTER — Other Ambulatory Visit: Payer: Self-pay | Admitting: Hematology & Oncology

## 2020-11-03 ENCOUNTER — Other Ambulatory Visit (HOSPITAL_BASED_OUTPATIENT_CLINIC_OR_DEPARTMENT_OTHER): Payer: Self-pay

## 2020-11-03 ENCOUNTER — Other Ambulatory Visit: Payer: Self-pay

## 2020-11-03 ENCOUNTER — Inpatient Hospital Stay (HOSPITAL_BASED_OUTPATIENT_CLINIC_OR_DEPARTMENT_OTHER): Payer: 59 | Admitting: Hematology & Oncology

## 2020-11-03 VITALS — BP 131/73 | HR 82 | Temp 98.6°F | Resp 18 | Wt 166.0 lb

## 2020-11-03 DIAGNOSIS — C799 Secondary malignant neoplasm of unspecified site: Secondary | ICD-10-CM

## 2020-11-03 DIAGNOSIS — C679 Malignant neoplasm of bladder, unspecified: Secondary | ICD-10-CM

## 2020-11-03 DIAGNOSIS — Z5112 Encounter for antineoplastic immunotherapy: Secondary | ICD-10-CM | POA: Diagnosis not present

## 2020-11-03 LAB — CMP (CANCER CENTER ONLY)
ALT: 12 U/L (ref 0–44)
AST: 12 U/L — ABNORMAL LOW (ref 15–41)
Albumin: 3.9 g/dL (ref 3.5–5.0)
Alkaline Phosphatase: 45 U/L (ref 38–126)
Anion gap: 7 (ref 5–15)
BUN: 28 mg/dL — ABNORMAL HIGH (ref 8–23)
CO2: 30 mmol/L (ref 22–32)
Calcium: 10.1 mg/dL (ref 8.9–10.3)
Chloride: 101 mmol/L (ref 98–111)
Creatinine: 1.21 mg/dL (ref 0.61–1.24)
GFR, Estimated: >60 mL/min (ref >60)
Glucose, Bld: 144 mg/dL — ABNORMAL HIGH (ref 70–99)
Potassium: 4 mmol/L (ref 3.5–5.1)
Sodium: 138 mmol/L (ref 135–145)
Total Bilirubin: 0.3 mg/dL (ref 0.3–1.2)
Total Protein: 6.7 g/dL (ref 6.5–8.1)

## 2020-11-03 LAB — CBC WITH DIFFERENTIAL (CANCER CENTER ONLY)
Abs Immature Granulocytes: 0.35 10*3/uL — ABNORMAL HIGH (ref 0.00–0.07)
Basophils Absolute: 0 10*3/uL (ref 0.0–0.1)
Basophils Relative: 0 %
Eosinophils Absolute: 0.2 10*3/uL (ref 0.0–0.5)
Eosinophils Relative: 1 %
HCT: 30.9 % — ABNORMAL LOW (ref 39.0–52.0)
Hemoglobin: 10 g/dL — ABNORMAL LOW (ref 13.0–17.0)
Immature Granulocytes: 2 %
Lymphocytes Relative: 3 %
Lymphs Abs: 0.5 10*3/uL — ABNORMAL LOW (ref 0.7–4.0)
MCH: 32.2 pg (ref 26.0–34.0)
MCHC: 32.4 g/dL (ref 30.0–36.0)
MCV: 99.4 fL (ref 80.0–100.0)
Monocytes Absolute: 0.8 10*3/uL (ref 0.1–1.0)
Monocytes Relative: 4 %
Neutro Abs: 15.8 10*3/uL — ABNORMAL HIGH (ref 1.7–7.7)
Neutrophils Relative %: 90 %
Platelet Count: 215 10*3/uL (ref 150–400)
RBC: 3.11 MIL/uL — ABNORMAL LOW (ref 4.22–5.81)
RDW: 18.6 % — ABNORMAL HIGH (ref 11.5–15.5)
WBC Count: 17.6 10*3/uL — ABNORMAL HIGH (ref 4.0–10.5)
nRBC: 0 % (ref 0.0–0.2)

## 2020-11-03 LAB — IRON AND TIBC
Iron: 68 ug/dL (ref 42–163)
Saturation Ratios: 27 % (ref 20–55)
TIBC: 253 ug/dL (ref 202–409)
UIBC: 184 ug/dL (ref 117–376)

## 2020-11-03 LAB — LACTATE DEHYDROGENASE: LDH: 156 U/L (ref 98–192)

## 2020-11-03 LAB — FERRITIN: Ferritin: 173 ng/mL (ref 24–336)

## 2020-11-03 LAB — RETICULOCYTES
Immature Retic Fract: 27.4 % — ABNORMAL HIGH (ref 2.3–15.9)
RBC.: 3.07 MIL/uL — ABNORMAL LOW (ref 4.22–5.81)
Retic Count, Absolute: 100.4 10*3/uL (ref 19.0–186.0)
Retic Ct Pct: 3.3 % — ABNORMAL HIGH (ref 0.4–3.1)

## 2020-11-03 LAB — SAMPLE TO BLOOD BANK

## 2020-11-03 LAB — TSH: TSH: 1.579 u[IU]/mL (ref 0.320–4.118)

## 2020-11-03 MED ORDER — SODIUM CHLORIDE 0.9% FLUSH
10.0000 mL | INTRAVENOUS | Status: DC | PRN
  Administered 2020-11-03: 10 mL
  Filled 2020-11-03: qty 10

## 2020-11-03 MED ORDER — HEPARIN SOD (PORK) LOCK FLUSH 100 UNIT/ML IV SOLN
500.0000 [IU] | Freq: Once | INTRAVENOUS | Status: AC | PRN
  Administered 2020-11-03: 500 [IU]
  Filled 2020-11-03: qty 5

## 2020-11-03 MED ORDER — SODIUM CHLORIDE 0.9 % IV SOLN
200.0000 mg | Freq: Once | INTRAVENOUS | Status: AC
  Administered 2020-11-03: 200 mg via INTRAVENOUS
  Filled 2020-11-03: qty 8

## 2020-11-03 MED ORDER — SODIUM CHLORIDE 0.9 % IV SOLN
Freq: Once | INTRAVENOUS | Status: AC
  Filled 2020-11-03: qty 250

## 2020-11-03 MED FILL — Orphenadrine Citrate Tab ER 12HR 100 MG: ORAL | 30 days supply | Qty: 60 | Fill #0 | Status: AC

## 2020-11-03 NOTE — Telephone Encounter (Signed)
appts made per 11/03/20 los and pt to gain sch in tx/avs and through [employer]

## 2020-11-03 NOTE — Progress Notes (Signed)
Hematology and Oncology Follow Up Visit  Brian Benton [id number] 11-12-1955 65 y.o. 11/03/2020   Principle Diagnosis:   Metastatic bladder cancer-localized disease  Extensive thrombus of the left leg-status post thrombectomy and iliac stent  Current Therapy:    Curative radiation with weekly cis-platinum-status post 6 cycle of weekly cis-platinum  --  Completed 04/08/2020   dd MVAC -- s/p cycle #3 -- started on 06/02/2020  --DC on 07/28/2020 secondary to toxicity  Eliquis 5 mg p.o. twice daily  Cis-platinum/Gemzar-s/p cycle #3 - start on 08/05/2020 - d/c on 10/08/2020  Pembrolizumab 200 mg/m2 IV q 3 week -- s/p cycle #1 -- start on 10/14/2020     Interim History:  Brian Benton is back for a follow-up.  The big problem is he has pain in his left leg.  I am sure this is coming from the tumor.  I would like to think that we can somehow shrink this tumor so the pain will get better.  I will speak with Dr. Sondra Come of Radiation Oncology to see if there is anything that he can offer.  He did have a nice birthday.  His birthday was on [birth year].  There was a nice party on Saturday.  He otherwise is doing okay.  He is tolerated the pembrolizumab nicely.  He has had only 1 cycle.  He has had little bit of diarrhea.  He is actually thankful for this.  He was really constipated because of his pain medication.  He has had no problems with cough or shortness of breath.  He has had no difficulties urinating.  He has had no swelling in the legs.  He is on Eliquis for the thrombus in the left leg.  Thankfully, there is no issues with respect to edema.  He has had no fever.  There has been no rashes.  Otherwise, his performance status is ECOG 1.      Medications:  Current Outpatient Medications:  .  apixaban (ELIQUIS) 5 MG TABS tablet, TAKE 1 TABLET (5 MG TOTAL) BY MOUTH 2 (TWO) TIMES DAILY., Disp: 60 tablet, Rfl: 5 .  ASPIRIN LOW DOSE 81 MG EC tablet, TAKE 1 TABLET BY MOUTH EVERY DAY, Disp: 30  tablet, Rfl: 11 .  Calcium Carbonate-Vit D-Min (CALTRATE PLUS PO), Take 1 tablet by mouth daily., Disp: , Rfl:  .  fentaNYL (DURAGESIC) 50 MCG/HR, PLACE 1 PATCH ONTO THE SKIN EVERY 3 (THREE) DAYS., Disp: 10 patch, Rfl: 0 .  gabapentin (NEURONTIN) 600 MG tablet, TAKE 1 TABLET (600 MG TOTAL) BY MOUTH IN THE MORNING, AT NOON, IN THE EVENING, AND AT BEDTIME., Disp: 120 tablet, Rfl: 3 .  lidocaine-prilocaine (EMLA) cream, Apply to affected area once, Disp: 30 g, Rfl: 3 .  LORazepam (ATIVAN) 0.5 MG tablet, TAKE 1 TABLET (0.5 MG TOTAL) BY MOUTH EVERY 6 (SIX) HOURS AS NEEDED FOR ANXIETY., Disp: 90 tablet, Rfl: 1 .  meloxicam (MOBIC) 15 MG tablet, TAKE 1 TABLET (15 MG TOTAL) BY MOUTH DAILY., Disp: 30 tablet, Rfl: 3 .  Multiple Vitamins-Minerals (MULTIVITAMIN WITH MINERALS) tablet, Take 1 tablet by mouth daily., Disp: , Rfl:  .  Oxycodone HCl 10 MG TABS, TAKE 1 TABLET BY MOUTH EVERY 4 HOURS AS NEEDED, Disp: 90 tablet, Rfl: 0 .  sodium chloride (OCEAN) 0.65 % SOLN nasal spray, Place 1 spray into both nostrils daily as needed for congestion (Allergies)., Disp: , Rfl:  .  zolpidem (AMBIEN) 10 MG tablet, TAKE 1 TABLET (10 MG TOTAL) BY MOUTH AT BEDTIME AS  NEEDED FOR SLEEP., Disp: 30 tablet, Rfl: 0 .  dexamethasone (DECADRON) 4 MG tablet, TAKE TWO TABLETS (8 MG TOTAL) BY MOUTH DAILY WITH FOOD FOR THREE DAYS STARTING THE DAY AFTER CISPLATIN CHEMOTHERAPY. (Patient not taking: Reported on 11/03/2020), Disp: 30 tablet, Rfl: 1 .  Lactulose 20 GM/30ML SOLN, Take 30 mLs (20 g total) by mouth every 4 (four) hours as needed. (Patient not taking: Reported on 11/03/2020), Disp: 450 mL, Rfl: 2 .  lactulose, encephalopathy, (CHRONULAC) 10 GM/15ML SOLN, TAKE 30MLS BY MOUTH EVERY 4 HOURS AS NEEDED, Disp: 450 mL, Rfl: 2 .  ondansetron (ZOFRAN) 8 MG tablet, Take 1 tablet (8 mg total) by mouth 2 (two) times daily as needed (Nausea or vomiting). (Patient not taking: Reported on 11/03/2020), Disp: 30 tablet, Rfl: 1 .  ondansetron  (ZOFRAN-ODT) 8 MG disintegrating tablet, TAKE 1 TABLET BY MOUTH EVERY 8 HOURS AS NEEDED FOR NAUSEA & VOMITING (Patient not taking: Reported on 11/03/2020), Disp: 30 tablet, Rfl: 0 .  pantoprazole (PROTONIX) 40 MG tablet, TAKE 1 TABLET BY MOUTH ONCE DAILY (Patient not taking: Reported on 11/03/2020), Disp: 30 tablet, Rfl: 3 .  predniSONE (DELTASONE) 10 MG tablet, Take 6 tablets by mouth on day 1, then 5 tabs day 2, then 4 tabs day 3, then 3 tabs day 4, then 2 tabs day 5, then 1 tab day 6. (Patient taking differently: Take 6 tablets by mouth on day 1, then 5 tabs day 2, then 4 tabs day 3, then 3 tabs day 4, then 2 tabs day 5, then 1 tab day 6.), Disp: 21 tablet, Rfl: 0 .  predniSONE (DELTASONE) 20 MG tablet, Take 40mg  daily for 7 days followed by 20mg  daily for 7 days. (Patient not taking: Reported on 11/03/2020), Disp: 21 tablet, Rfl: 0 .  prochlorperazine (COMPAZINE) 10 MG tablet, TAKE 1 TABLET (10 MG TOTAL) BY MOUTH EVERY 6 (SIX) HOURS AS NEEDED FOR NAUSEA OR VOMITING. (Patient not taking: Reported on 11/03/2020), Disp: 30 tablet, Rfl: 1  Allergies: No Known Allergies  Past Medical History, Surgical history, Social history, and Family History were reviewed and updated.  Review of Systems: Review of Systems  Constitutional: Negative.   HENT:  Negative.   Eyes: Negative.   Respiratory: Negative.   Cardiovascular: Positive for leg swelling.  Gastrointestinal: Negative.   Endocrine: Negative.   Musculoskeletal: Positive for flank pain.  Skin: Negative.   Neurological: Negative.   Hematological: Negative.   Psychiatric/Behavioral: Negative.     Physical Exam:  weight is 166 lb (75.3 kg). His oral temperature is 98.6 F (37 C). His blood pressure is 131/73 and his pulse is 82. His respiration is 18 and oxygen saturation is 98%.   Wt Readings from Last 3 Encounters:  11/03/20 166 lb (75.3 kg)  11/03/20 166 lb (75.3 kg)  10/09/20 164 lb 12.8 oz (74.8 kg)    Physical Exam Vitals reviewed.   HENT:     Head: Normocephalic and atraumatic.  Eyes:     Pupils: Pupils are equal, round, and reactive to light.  Cardiovascular:     Rate and Rhythm: Normal rate and regular rhythm.     Heart sounds: Normal heart sounds.  Pulmonary:     Effort: Pulmonary effort is normal.     Breath sounds: Normal breath sounds.  Abdominal:     General: Bowel sounds are normal.     Palpations: Abdomen is soft.  Musculoskeletal:        General: No tenderness or deformity. Normal range of  motion.     Cervical back: Normal range of motion.     Comments: Extremities shows minimal swelling of the left leg.  He has good range of motion of the leg.  He has good pulses in his distal extremities.  The right leg is unremarkable.  Lymphadenopathy:     Cervical: No cervical adenopathy.  Skin:    General: Skin is warm and dry.     Findings: No erythema or rash.  Neurological:     Mental Status: He is alert and oriented to person, place, and time.  Psychiatric:        Behavior: Behavior normal.        Thought Content: Thought content normal.        Judgment: Judgment normal.    Lab Results  Component Value Date   WBC 17.6 (H) 11/03/2020   HGB 10.0 (L) 11/03/2020   HCT 30.9 (L) 11/03/2020   MCV 99.4 11/03/2020   PLT 215 11/03/2020     Chemistry      Component Value Date/Time   NA 138 11/03/2020 0858   K 4.0 11/03/2020 0858   CL 101 11/03/2020 0858   CO2 30 11/03/2020 0858   BUN 28 (H) 11/03/2020 0858   CREATININE 1.21 11/03/2020 0858      Component Value Date/Time   CALCIUM 10.1 11/03/2020 0858   ALKPHOS 45 11/03/2020 0858   AST 12 (L) 11/03/2020 0858   ALT 12 11/03/2020 0858   BILITOT 0.3 11/03/2020 0858      Impression and Plan: Brian Benton is a really nice 65 year old white male.  He has a very interesting problem.  He had in situ bladder cancer about 5 years ago.  Now, he has a recurrence that I would consider metastatic.  This is outside of the bladder.  It is not operable as it is  appear to be invading to the psoas muscle and also involving his lower spine.  He completed initial radiation and chemotherapy.  He did very well with this.  He had a very nice response by PET scan and by MRI.  We will continue him on the pembrolizumab.  Hopefully, we will see a response.  It will take at least 3-4 cycles before we see a response.  Again I want to try to help with the pain that he is having in the left leg.  Maybe, I do not know if radiosurgery could be done with respect to this retroperitoneal tumor.  I know he is already had radiation therapy.  We will plan for another follow-up in 3 weeks.    Volanda Napoleon, MD 5/24/202210:01 AM

## 2020-11-03 NOTE — Progress Notes (Signed)
Spoke to patient in the treatment room. He has started his hydrotherapy and states he is really enjoying it. It seems to help his pain for a few days at a time. He currently is being seen twice a week. He continues to use his medication to manage pain as well.   Feels good after his first cycle with immunotherapy. He is relieved that he had minimal side effects and tolerated infusion well. Now just eager to see if he responds.   No further needs or questions at this time.   Oncology Nurse Navigator Documentation  Oncology Nurse Navigator Flowsheets 11/03/2020  Abnormal Finding Date -  Confirmed Diagnosis Date -  Diagnosis Status -  Planned Course of Treatment -  Phase of Treatment -  Chemotherapy Pending- Reason: -  Chemo/Radiation Concurrent Actual Start Date: -  Navigator Follow Up Date: 11/26/2020  Navigator Follow Up Reason: Follow-up Appointment;Chemotherapy  Navigator Location [facility]  Referral Date to RadOnc/MedOnc -  Navigator Encounter Type Treatment  Telephone -  Treatment Initiated Date -  Patient Visit Type MedOnc  Treatment Phase Active Tx  Barriers/Navigation Needs Coordination of Care;Education  Education -  Interventions Psycho-Social Support  Acuity Level 2-Minimal Needs (1-2 Barriers Identified)  Referrals -  Coordination of Care -  Education Method -  Support Groups/Services Friends and Family  Time Spent with Patient 30

## 2020-11-03 NOTE — Patient Instructions (Signed)
[facility]  Discharge Instructions: Thank you for choosing [facility] to provide your oncology and hematology care.   If you have a lab appointment with the [facility], please go directly to the [facility] and check in at the registration area.  Wear comfortable clothing and clothing appropriate for easy access to any Portacath or PICC line.   We strive to give you quality time with your provider. You may need to reschedule your appointment if you arrive late (15 or more minutes).  Arriving late affects you and other patients whose appointments are after yours.  Also, if you miss three or more appointments without notifying the office, you may be dismissed from the clinic at the provider's discretion.      For prescription refill requests, have your pharmacy contact our office and allow 72 hours for refills to be completed.    Today you received the following chemotherapy and/or immunotherapy agents Beryle Flock    To help prevent nausea and vomiting after your treatment, we encourage you to take your nausea medication as directed.  BELOW ARE SYMPTOMS THAT SHOULD BE REPORTED IMMEDIATELY: . *FEVER GREATER THAN 100.4 F (38 C) OR HIGHER . *CHILLS OR SWEATING . *NAUSEA AND VOMITING THAT IS NOT CONTROLLED WITH YOUR NAUSEA MEDICATION . *UNUSUAL SHORTNESS OF BREATH . *UNUSUAL BRUISING OR BLEEDING . *URINARY PROBLEMS (pain or burning when urinating, or frequent urination) . *BOWEL PROBLEMS (unusual diarrhea, constipation, pain near the anus) . TENDERNESS IN MOUTH AND THROAT WITH OR WITHOUT PRESENCE OF ULCERS (sore throat, sores in mouth, or a toothache) . UNUSUAL RASH, SWELLING OR PAIN  . UNUSUAL VAGINAL DISCHARGE OR ITCHING   Items with * indicate a potential emergency and should be followed up as soon as possible or go to the Emergency Department if any problems should occur.  Please show the CHEMOTHERAPY ALERT CARD or IMMUNOTHERAPY ALERT CARD at  check-in to the Emergency Department and triage nurse. Should you have questions after your visit or need to cancel or reschedule your appointment, please contact [facility]  [phone] and follow the prompts.  Office hours are 8:00 a.m. to 4:30 p.m. Monday - Friday. Please note that voicemails left after 4:00 p.m. may not be returned until the following business day.  We are closed weekends and major holidays. You have access to a nurse at all times for urgent questions. Please call the main number to the clinic [phone] and follow the prompts.  For any non-urgent questions, you may also contact your provider using MyChart. We now offer e-Visits for anyone 62 and older to request care online for non-urgent symptoms. For details visit [email or web address].   Also download the MyChart app! Go to the app store, search "MyChart", open the app, select [facility], and log in with your MyChart username and password.  Due to Covid, a mask is required upon entering the hospital/clinic. If you do not have a mask, one will be given to you upon arrival. For doctor visits, patients may have 1 support person aged 38 or older with them. For treatment visits, patients cannot have anyone with them due to current Covid guidelines and our immunocompromised population. Pembrolizumab injection What is this medicine? PEMBROLIZUMAB (pem broe liz ue mab) is a monoclonal antibody. It is used to treat certain types of cancer. This medicine may be used for other purposes; ask your health care provider or pharmacist if you have questions. COMMON BRAND NAME(S):  Keytruda What should I tell my health care provider before I take this medicine? They need to know if you have any of these conditions:  autoimmune diseases like Crohn's disease, ulcerative colitis, or lupus  have had or planning to have an allogeneic stem cell transplant (uses someone else's stem cells)  history of organ  transplant  history of chest radiation  nervous system problems like myasthenia gravis or Guillain-Barre syndrome  an unusual or allergic reaction to pembrolizumab, other medicines, foods, dyes, or preservatives  pregnant or trying to get pregnant  breast-feeding How should I use this medicine? This medicine is for infusion into a vein. It is given by a health care professional in a hospital or clinic setting. A special MedGuide will be given to you before each treatment. Be sure to read this information carefully each time. Talk to your pediatrician regarding the use of this medicine in children. While this drug may be prescribed for children as young as 6 months for selected conditions, precautions do apply. Overdosage: If you think you have taken too much of this medicine contact a poison control center or emergency room at once. NOTE: This medicine is only for you. Do not share this medicine with others. What if I miss a dose? It is important not to miss your dose. Call your doctor or health care professional if you are unable to keep an appointment. What may interact with this medicine? Interactions have not been studied. This list may not describe all possible interactions. Give your health care provider a list of all the medicines, herbs, non-prescription drugs, or dietary supplements you use. Also tell them if you smoke, drink alcohol, or use illegal drugs. Some items may interact with your medicine. What should I watch for while using this medicine? Your condition will be monitored carefully while you are receiving this medicine. You may need blood work done while you are taking this medicine. Do not become pregnant while taking this medicine or for 4 months after stopping it. Women should inform their doctor if they wish to become pregnant or think they might be pregnant. There is a potential for serious side effects to an unborn child. Talk to your health care professional or  pharmacist for more information. Do not breast-feed an infant while taking this medicine or for 4 months after the last dose. What side effects may I notice from receiving this medicine? Side effects that you should report to your doctor or health care professional as soon as possible:  allergic reactions like skin rash, itching or hives, swelling of the face, lips, or tongue  bloody or black, tarry  breathing problems  changes in vision  chest pain  chills  confusion  constipation  cough  diarrhea  dizziness or feeling faint or lightheaded  fast or irregular heartbeat  fever  flushing  joint pain  low blood counts - this medicine may decrease the number of white blood cells, red blood cells and platelets. You may be at increased risk for infections and bleeding.  muscle pain  muscle weakness  pain, tingling, numbness in the hands or feet  persistent headache  redness, blistering, peeling or loosening of the skin, including inside the mouth  signs and symptoms of high blood sugar such as dizziness; dry mouth; dry skin; fruity breath; nausea; stomach pain; increased hunger or thirst; increased urination  signs and symptoms of kidney injury like trouble passing urine or change in the amount of urine  signs and symptoms of  liver injury like dark urine, light-colored stools, loss of appetite, nausea, right upper belly pain, yellowing of the eyes or skin  sweating  swollen lymph nodes  weight loss Side effects that usually do not require medical attention (report to your doctor or health care professional if they continue or are bothersome):  decreased appetite  hair loss  tiredness This list may not describe all possible side effects. Call your doctor for medical advice about side effects. You may report side effects to FDA at 1-800-FDA-1088. Where should I keep my medicine? This drug is given in a hospital or clinic and will not be stored at home. NOTE:  This sheet is a summary. It may not cover all possible information. If you have questions about this medicine, talk to your doctor, pharmacist, or health care provider.  2021 Elsevier/Gold Standard (2019-05-01 21:44:53)

## 2020-11-03 NOTE — Patient Instructions (Signed)
Implanted Port Insertion, Care After This sheet gives you information about how to care for yourself after your procedure. Your health care provider may also give you more specific instructions. If you have problems or questions, contact your health care provider. What can I expect after the procedure? After the procedure, it is common to have:  Discomfort at the port insertion site.  Bruising on the skin over the port. This should improve over 3-4 days. Follow these instructions at home: Port care  After your port is placed, you will get a manufacturer's information card. The card has information about your port. Keep this card with you at all times.  Take care of the port as told by your health care provider. Ask your health care provider if you or a family member can get training for taking care of the port at home. A home health care nurse may also take care of the port.  Make sure to remember what type of port you have. Incision care  Follow instructions from your health care provider about how to take care of your port insertion site. Make sure you: ? Wash your hands with soap and water before and after you change your bandage (dressing). If soap and water are not available, use hand sanitizer. ? Change your dressing as told by your health care provider. ? Leave stitches (sutures), skin glue, or adhesive strips in place. These skin closures may need to stay in place for 2 weeks or longer. If adhesive strip edges start to loosen and curl up, you may trim the loose edges. Do not remove adhesive strips completely unless your health care provider tells you to do that.  Check your port insertion site every day for signs of infection. Check for: ? Redness, swelling, or pain. ? Fluid or blood. ? Warmth. ? Pus or a bad smell.      Activity  Return to your normal activities as told by your health care provider. Ask your health care provider what activities are safe for you.  Do not  lift anything that is heavier than 10 lb (4.5 kg), or the limit that you are told, until your health care provider says that it is safe. General instructions  Take over-the-counter and prescription medicines only as told by your health care provider.  Do not take baths, swim, or use a hot tub until your health care provider approves. Ask your health care provider if you may take showers. You may only be allowed to take sponge baths.  Do not drive for 24 hours if you were given a sedative during your procedure.  Wear a medical alert bracelet in case of an emergency. This will tell any health care providers that you have a port.  Keep all follow-up visits as told by your health care provider. This is important. Contact a health care provider if:  You cannot flush your port with saline as directed, or you cannot draw blood from the port.  You have a fever or chills.  You have redness, swelling, or pain around your port insertion site.  You have fluid or blood coming from your port insertion site.  Your port insertion site feels warm to the touch.  You have pus or a bad smell coming from the port insertion site. Get help right away if:  You have chest pain or shortness of breath.  You have bleeding from your port that you cannot control. Summary  Take care of the port as told by your   health care provider. Keep the manufacturer's information card with you at all times.  Change your dressing as told by your health care provider.  Contact a health care provider if you have a fever or chills or if you have redness, swelling, or pain around your port insertion site.  Keep all follow-up visits as told by your health care provider. This information is not intended to replace advice given to you by your health care provider. Make sure you discuss any questions you have with your health care provider. Document Revised: 12/26/2017 Document Reviewed: 12/26/2017 Elsevier Patient Education   2021 [employer]  

## 2020-11-04 ENCOUNTER — Other Ambulatory Visit (HOSPITAL_BASED_OUTPATIENT_CLINIC_OR_DEPARTMENT_OTHER): Payer: Self-pay

## 2020-11-10 ENCOUNTER — Other Ambulatory Visit: Payer: Self-pay | Admitting: Hematology & Oncology

## 2020-11-10 ENCOUNTER — Other Ambulatory Visit (HOSPITAL_BASED_OUTPATIENT_CLINIC_OR_DEPARTMENT_OTHER): Payer: Self-pay

## 2020-11-10 DIAGNOSIS — C679 Malignant neoplasm of bladder, unspecified: Secondary | ICD-10-CM

## 2020-11-10 MED ORDER — MELOXICAM 15 MG PO TABS
ORAL_TABLET | Freq: Every day | ORAL | 3 refills | Status: DC
  Filled 2020-11-10: qty 30, 30d supply, fill #0

## 2020-11-10 MED ORDER — LORAZEPAM 0.5 MG PO TABS
ORAL_TABLET | ORAL | 1 refills | Status: DC
  Filled 2020-11-10: qty 90, fill #0
  Filled 2020-11-11: qty 90, 23d supply, fill #0
  Filled 2020-12-02: qty 90, 23d supply, fill #1

## 2020-11-11 ENCOUNTER — Other Ambulatory Visit (HOSPITAL_BASED_OUTPATIENT_CLINIC_OR_DEPARTMENT_OTHER): Payer: Self-pay

## 2020-11-11 ENCOUNTER — Encounter: Payer: Self-pay | Admitting: *Deleted

## 2020-11-11 ENCOUNTER — Telehealth: Payer: Self-pay | Admitting: *Deleted

## 2020-11-11 NOTE — Telephone Encounter (Signed)
Call received from patient wanting to know plan for radiation per order of Dr. Marin Olp.  Informed pt that I would speak with Dr. Marin Olp and let him know.  Pt requests that return message be sent via MyChart or phone call.

## 2020-11-18 ENCOUNTER — Other Ambulatory Visit (HOSPITAL_BASED_OUTPATIENT_CLINIC_OR_DEPARTMENT_OTHER): Payer: Self-pay

## 2020-11-18 ENCOUNTER — Encounter: Payer: Self-pay | Admitting: *Deleted

## 2020-11-18 ENCOUNTER — Other Ambulatory Visit: Payer: Self-pay | Admitting: *Deleted

## 2020-11-18 MED ORDER — ZOLPIDEM TARTRATE 10 MG PO TABS
ORAL_TABLET | Freq: Every evening | ORAL | 0 refills | Status: DC | PRN
  Filled 2020-11-18: qty 30, fill #0
  Filled 2020-11-27: qty 30, 30d supply, fill #0

## 2020-11-18 MED ORDER — MELOXICAM 15 MG PO TABS
ORAL_TABLET | Freq: Every day | ORAL | 3 refills | Status: DC
  Filled 2020-11-18: qty 30, 30d supply, fill #0
  Filled 2020-12-15: qty 30, 30d supply, fill #1
  Filled 2021-01-11: qty 30, 30d supply, fill #2

## 2020-11-19 ENCOUNTER — Other Ambulatory Visit (HOSPITAL_BASED_OUTPATIENT_CLINIC_OR_DEPARTMENT_OTHER): Payer: Self-pay

## 2020-11-26 ENCOUNTER — Other Ambulatory Visit (HOSPITAL_BASED_OUTPATIENT_CLINIC_OR_DEPARTMENT_OTHER): Payer: Self-pay

## 2020-11-26 ENCOUNTER — Inpatient Hospital Stay: Payer: 59

## 2020-11-26 ENCOUNTER — Encounter: Payer: Self-pay | Admitting: Hematology & Oncology

## 2020-11-26 ENCOUNTER — Inpatient Hospital Stay (HOSPITAL_BASED_OUTPATIENT_CLINIC_OR_DEPARTMENT_OTHER): Payer: 59 | Admitting: Hematology & Oncology

## 2020-11-26 ENCOUNTER — Encounter: Payer: Self-pay | Admitting: *Deleted

## 2020-11-26 ENCOUNTER — Other Ambulatory Visit: Payer: Self-pay

## 2020-11-26 ENCOUNTER — Other Ambulatory Visit: Payer: Self-pay | Admitting: Hematology & Oncology

## 2020-11-26 ENCOUNTER — Inpatient Hospital Stay: Payer: 59 | Attending: Hematology & Oncology

## 2020-11-26 VITALS — BP 129/71 | HR 82 | Temp 98.5°F | Resp 18 | Wt 160.0 lb

## 2020-11-26 DIAGNOSIS — Z5112 Encounter for antineoplastic immunotherapy: Secondary | ICD-10-CM | POA: Insufficient documentation

## 2020-11-26 DIAGNOSIS — C679 Malignant neoplasm of bladder, unspecified: Secondary | ICD-10-CM | POA: Insufficient documentation

## 2020-11-26 DIAGNOSIS — C799 Secondary malignant neoplasm of unspecified site: Secondary | ICD-10-CM | POA: Diagnosis not present

## 2020-11-26 LAB — CBC WITH DIFFERENTIAL (CANCER CENTER ONLY)
Abs Immature Granulocytes: 0.04 10*3/uL (ref 0.00–0.07)
Basophils Absolute: 0.1 10*3/uL (ref 0.0–0.1)
Basophils Relative: 1 %
Eosinophils Absolute: 0.3 10*3/uL (ref 0.0–0.5)
Eosinophils Relative: 4 %
HCT: 30.5 % — ABNORMAL LOW (ref 39.0–52.0)
Hemoglobin: 9.9 g/dL — ABNORMAL LOW (ref 13.0–17.0)
Immature Granulocytes: 1 %
Lymphocytes Relative: 7 %
Lymphs Abs: 0.6 10*3/uL — ABNORMAL LOW (ref 0.7–4.0)
MCH: 32.2 pg (ref 26.0–34.0)
MCHC: 32.5 g/dL (ref 30.0–36.0)
MCV: 99.3 fL (ref 80.0–100.0)
Monocytes Absolute: 0.6 10*3/uL (ref 0.1–1.0)
Monocytes Relative: 7 %
Neutro Abs: 7.1 10*3/uL (ref 1.7–7.7)
Neutrophils Relative %: 80 %
Platelet Count: 202 10*3/uL (ref 150–400)
RBC: 3.07 MIL/uL — ABNORMAL LOW (ref 4.22–5.81)
RDW: 14.8 % (ref 11.5–15.5)
WBC Count: 8.7 10*3/uL (ref 4.0–10.5)
nRBC: 0 % (ref 0.0–0.2)

## 2020-11-26 LAB — CMP (CANCER CENTER ONLY)
ALT: 8 U/L (ref 0–44)
AST: 11 U/L — ABNORMAL LOW (ref 15–41)
Albumin: 4.1 g/dL (ref 3.5–5.0)
Alkaline Phosphatase: 57 U/L (ref 38–126)
Anion gap: 8 (ref 5–15)
BUN: 26 mg/dL — ABNORMAL HIGH (ref 8–23)
CO2: 28 mmol/L (ref 22–32)
Calcium: 10.5 mg/dL — ABNORMAL HIGH (ref 8.9–10.3)
Chloride: 102 mmol/L (ref 98–111)
Creatinine: 1.37 mg/dL — ABNORMAL HIGH (ref 0.61–1.24)
GFR, Estimated: 57 mL/min — ABNORMAL LOW (ref >60)
Glucose, Bld: 85 mg/dL (ref 70–99)
Potassium: 4.6 mmol/L (ref 3.5–5.1)
Sodium: 138 mmol/L (ref 135–145)
Total Bilirubin: 0.4 mg/dL (ref 0.3–1.2)
Total Protein: 6.7 g/dL (ref 6.5–8.1)

## 2020-11-26 LAB — LACTATE DEHYDROGENASE: LDH: 191 U/L (ref 98–192)

## 2020-11-26 MED ORDER — OXYCODONE HCL 10 MG PO TABS
ORAL_TABLET | ORAL | 0 refills | Status: DC | PRN
  Filled 2020-11-26: qty 90, 15d supply, fill #0

## 2020-11-26 MED ORDER — FENTANYL 50 MCG/HR TD PT72
MEDICATED_PATCH | TRANSDERMAL | 0 refills | Status: DC
  Filled 2020-11-26 – 2020-11-27 (×2): qty 10, 30d supply, fill #0

## 2020-11-26 MED ORDER — SODIUM CHLORIDE 0.9% FLUSH
10.0000 mL | INTRAVENOUS | Status: DC | PRN
  Administered 2020-11-26: 10 mL
  Filled 2020-11-26: qty 10

## 2020-11-26 MED ORDER — HEPARIN SOD (PORK) LOCK FLUSH 100 UNIT/ML IV SOLN
500.0000 [IU] | Freq: Once | INTRAVENOUS | Status: AC | PRN
  Administered 2020-11-26: 500 [IU]
  Filled 2020-11-26: qty 5

## 2020-11-26 MED ORDER — SODIUM CHLORIDE 0.9 % IV SOLN
Freq: Once | INTRAVENOUS | Status: AC
  Filled 2020-11-26: qty 250

## 2020-11-26 MED ORDER — SODIUM CHLORIDE 0.9 % IV SOLN
200.0000 mg | Freq: Once | INTRAVENOUS | Status: AC
  Administered 2020-11-26: 200 mg via INTRAVENOUS
  Filled 2020-11-26: qty 8

## 2020-11-26 MED FILL — Apixaban Tab 5 MG: ORAL | 30 days supply | Qty: 60 | Fill #2 | Status: AC

## 2020-11-26 NOTE — Progress Notes (Signed)
Hematology and Oncology Follow Up Visit  Brian Benton [id number] 05-28-1956 65 y.o. 11/26/2020   Principle Diagnosis:  Metastatic bladder cancer-localized disease Extensive thrombus of the left leg-status post thrombectomy and iliac stent  Current Therapy:   Curative radiation with weekly cis-platinum-status post 6 cycle of weekly cis-platinum  --  Completed 04/08/2020  dd MVAC -- s/p cycle #3 -- started on 06/02/2020  --DC on 07/28/2020 secondary to toxicity Eliquis 5 mg p.o. twice daily Cis-platinum/Gemzar-s/p cycle #3 - start on 08/05/2020 - d/c on 10/08/2020 Pembrolizumab 200 mg/m2 IV q 3 week -- s/p cycle #2-- start on 10/14/2020     Interim History:  Brian Benton is back for a follow-up.  So far, he is doing pretty well.  He has had 2 cycles of the pembrolizumab.  He will be heading out to [place] at the end of June for a business meeting.  He will be out there for about a month.  I told him to make sure that he drinks a lot of water on the flight.  He does wear the compression stocking on the left leg.  This is where he had the thrombus.  He has occasional pain in the left lateral hip.  He has some "tingling.".  Unfortunate, he cannot have any more radiation therapy.  He saw Dr. Sondra Come of Radiation Oncology.  Dr. Sondra Come reviewed the records.  He is already given maximal radiation to the lower spinal area.  Brian Benton is working.  He enjoys work.  He enjoys going to work.  He is trying to exercise.  Is given some physical therapy.  His appetite is doing okay.  He has had no nausea or vomiting.  He has had no diarrhea with the pembrolizumab.  He did have a recent upper respiratory infection.  He lost little bit of weight because of this.  Overall, his performance status is ECOG 1.  es with respect to edema.  He has had no fever.  There has been no rashes.  He is doing well on the Eliquis to date.  Otherwise, his performance status is ECOG 1.      Medications:   Current Outpatient Medications:    apixaban (ELIQUIS) 5 MG TABS tablet, TAKE 1 TABLET (5 MG TOTAL) BY MOUTH 2 (TWO) TIMES DAILY., Disp: 60 tablet, Rfl: 5   ASPIRIN LOW DOSE 81 MG EC tablet, TAKE 1 TABLET BY MOUTH EVERY DAY, Disp: 30 tablet, Rfl: 11   Calcium Carbonate-Vit D-Min (CALTRATE PLUS PO), Take 1 tablet by mouth daily., Disp: , Rfl:    fentaNYL (DURAGESIC) 50 MCG/HR, PLACE 1 PATCH ONTO THE SKIN EVERY 3 (THREE) DAYS., Disp: 10 patch, Rfl: 0   gabapentin (NEURONTIN) 600 MG tablet, TAKE 1 TABLET (600 MG TOTAL) BY MOUTH IN THE MORNING, AT NOON, IN THE EVENING, AND AT BEDTIME., Disp: 120 tablet, Rfl: 3   lactulose, encephalopathy, (CHRONULAC) 10 GM/15ML SOLN, TAKE 30MLS BY MOUTH EVERY 4 HOURS AS NEEDED, Disp: 450 mL, Rfl: 2   lidocaine-prilocaine (EMLA) cream, Apply to affected area once, Disp: 30 g, Rfl: 3   LORazepam (ATIVAN) 0.5 MG tablet, TAKE 1 TABLET (0.5 MG TOTAL) BY MOUTH EVERY 6 (SIX) HOURS AS NEEDED FOR ANXIETY., Disp: 90 tablet, Rfl: 1   meloxicam (MOBIC) 15 MG tablet, TAKE 1 TABLET (15 MG TOTAL) BY MOUTH DAILY., Disp: 30 tablet, Rfl: 3   Multiple Vitamins-Minerals (MULTIVITAMIN WITH MINERALS) tablet, Take 1 tablet by mouth daily., Disp: , Rfl:    orphenadrine (NORFLEX) 100 MG  tablet, TAKE 1 TABLET (100 MG TOTAL) BY MOUTH 2 (TWO) TIMES DAILY., Disp: 60 tablet, Rfl: 0   Oxycodone HCl 10 MG TABS, TAKE 1 TABLET BY MOUTH EVERY 4 HOURS AS NEEDED, Disp: 90 tablet, Rfl: 0   pantoprazole (PROTONIX) 40 MG tablet, TAKE 1 TABLET BY MOUTH ONCE DAILY, Disp: 30 tablet, Rfl: 3   predniSONE (DELTASONE) 10 MG tablet, Take 6 tablets by mouth on day 1, then 5 tabs day 2, then 4 tabs day 3, then 3 tabs day 4, then 2 tabs day 5, then 1 tab day 6. (Patient taking differently: Take 6 tablets by mouth on day 1, then 5 tabs day 2, then 4 tabs day 3, then 3 tabs day 4, then 2 tabs day 5, then 1 tab day 6.), Disp: 21 tablet, Rfl: 0   sodium chloride (OCEAN) 0.65 % SOLN nasal spray, Place 1 spray into both  nostrils daily as needed for congestion (Allergies)., Disp: , Rfl:    zolpidem (AMBIEN) 10 MG tablet, TAKE 1 TABLET (10 MG TOTAL) BY MOUTH AT BEDTIME AS NEEDED FOR SLEEP., Disp: 30 tablet, Rfl: 0   dexamethasone (DECADRON) 4 MG tablet, TAKE TWO TABLETS (8 MG TOTAL) BY MOUTH DAILY WITH FOOD FOR THREE DAYS STARTING THE DAY AFTER CISPLATIN CHEMOTHERAPY. (Patient not taking: No sig reported), Disp: 30 tablet, Rfl: 1   Lactulose 20 GM/30ML SOLN, Take 30 mLs (20 g total) by mouth every 4 (four) hours as needed. (Patient not taking: No sig reported), Disp: 450 mL, Rfl: 2   ondansetron (ZOFRAN) 8 MG tablet, Take 1 tablet (8 mg total) by mouth 2 (two) times daily as needed (Nausea or vomiting). (Patient not taking: No sig reported), Disp: 30 tablet, Rfl: 1   ondansetron (ZOFRAN-ODT) 8 MG disintegrating tablet, TAKE 1 TABLET BY MOUTH EVERY 8 HOURS AS NEEDED FOR NAUSEA & VOMITING (Patient not taking: No sig reported), Disp: 30 tablet, Rfl: 0   predniSONE (DELTASONE) 20 MG tablet, Take 40mg  daily for 7 days followed by 20mg  daily for 7 days. (Patient not taking: No sig reported), Disp: 21 tablet, Rfl: 0   prochlorperazine (COMPAZINE) 10 MG tablet, TAKE 1 TABLET (10 MG TOTAL) BY MOUTH EVERY 6 (SIX) HOURS AS NEEDED FOR NAUSEA OR VOMITING. (Patient not taking: No sig reported), Disp: 30 tablet, Rfl: 1  Allergies: No Known Allergies  Past Medical History, Surgical history, Social history, and Family History were reviewed and updated.  Review of Systems: Review of Systems  Constitutional: Negative.   HENT:  Negative.    Eyes: Negative.   Respiratory: Negative.    Cardiovascular:  Positive for leg swelling.  Gastrointestinal: Negative.   Endocrine: Negative.   Musculoskeletal:  Positive for flank pain.  Skin: Negative.   Neurological: Negative.   Hematological: Negative.   Psychiatric/Behavioral: Negative.     Physical Exam:  weight is 160 lb (72.6 kg). His oral temperature is 98.5 F (36.9 C). His blood  pressure is 129/71 and his pulse is 82. His respiration is 18 and oxygen saturation is 97%.   Wt Readings from Last 3 Encounters:  11/26/20 160 lb (72.6 kg)  11/03/20 166 lb (75.3 kg)  11/03/20 166 lb (75.3 kg)    Physical Exam Vitals reviewed.  HENT:     Head: Normocephalic and atraumatic.  Eyes:     Pupils: Pupils are equal, round, and reactive to light.  Cardiovascular:     Rate and Rhythm: Normal rate and regular rhythm.     Heart sounds: Normal  heart sounds.  Pulmonary:     Effort: Pulmonary effort is normal.     Breath sounds: Normal breath sounds.  Abdominal:     General: Bowel sounds are normal.     Palpations: Abdomen is soft.  Musculoskeletal:        General: No tenderness or deformity. Normal range of motion.     Cervical back: Normal range of motion.     Comments: Extremities shows minimal swelling of the left leg.  He has good range of motion of the leg.  He has good pulses in his distal extremities.  The right leg is unremarkable.  Lymphadenopathy:     Cervical: No cervical adenopathy.  Skin:    General: Skin is warm and dry.     Findings: No erythema or rash.  Neurological:     Mental Status: He is alert and oriented to person, place, and time.  Psychiatric:        Behavior: Behavior normal.        Thought Content: Thought content normal.        Judgment: Judgment normal.   Lab Results  Component Value Date   WBC 8.7 11/26/2020   HGB 9.9 (L) 11/26/2020   HCT 30.5 (L) 11/26/2020   MCV 99.3 11/26/2020   PLT 202 11/26/2020     Chemistry      Component Value Date/Time   NA 138 11/26/2020 0808   K 4.6 11/26/2020 0808   CL 102 11/26/2020 0808   CO2 28 11/26/2020 0808   BUN 26 (H) 11/26/2020 0808   CREATININE 1.37 (H) 11/26/2020 0808      Component Value Date/Time   CALCIUM 10.5 (H) 11/26/2020 0808   ALKPHOS 57 11/26/2020 0808   AST 11 (L) 11/26/2020 0808   ALT 8 11/26/2020 0808   BILITOT 0.4 11/26/2020 0808      Impression and Plan: Mr.  Benton is a really nice 65 year old white male.  He has a very interesting problem.  He had in situ bladder cancer about 5 years ago.  Now, he has a recurrence that I would consider metastatic.  This is outside of the bladder.  It is not operable as it is appear to be invading to the psoas muscle and also involving his lower spine.  We will proceed with his third cycle of immunotherapy.  I will probably plan for 4 cycles of immunotherapy before we do any scans.  I do not have any problems with him going out to [place].  He is always very cautious.  He will drink a lot of water.  He will be diligent with his anticoagulation.  He will try to stay active.  He will walk around on the plane during the long flight to the [facility]  I will plan to get him back to see [place] in another 3 weeks.     Volanda Napoleon, MD 6/16/20229:03 AM

## 2020-11-26 NOTE — Patient Instructions (Signed)
[facility]  Discharge Instructions: Thank you for choosing [facility] to provide your oncology and hematology care.   If you have a lab appointment with the Cancer Center, please go directly to the Cancer Center and check in at the registration area.  Wear comfortable clothing and clothing appropriate for easy access to any Portacath or PICC line.   We strive to give you quality time with your provider. You may need to reschedule your appointment if you arrive late (15 or more minutes).  Arriving late affects you and other patients whose appointments are after yours.  Also, if you miss three or more appointments without notifying the office, you may be dismissed from the clinic at the provider's discretion.      For prescription refill requests, have your pharmacy contact our office and allow 72 hours for refills to be completed.    Today you received the following chemotherapy and/or immunotherapy agents Keytruda      To help prevent nausea and vomiting after your treatment, we encourage you to take your nausea medication as directed.  BELOW ARE SYMPTOMS THAT SHOULD BE REPORTED IMMEDIATELY: *FEVER GREATER THAN 100.4 F (38 C) OR HIGHER *CHILLS OR SWEATING *NAUSEA AND VOMITING THAT IS NOT CONTROLLED WITH YOUR NAUSEA MEDICATION *UNUSUAL SHORTNESS OF BREATH *UNUSUAL BRUISING OR BLEEDING *URINARY PROBLEMS (pain or burning when urinating, or frequent urination) *BOWEL PROBLEMS (unusual diarrhea, constipation, pain near the anus) TENDERNESS IN MOUTH AND THROAT WITH OR WITHOUT PRESENCE OF ULCERS (sore throat, sores in mouth, or a toothache) UNUSUAL RASH, SWELLING OR PAIN  UNUSUAL VAGINAL DISCHARGE OR ITCHING   Items with * indicate a potential emergency and should be followed up as soon as possible or go to the Emergency Department if any problems should occur.  Please show the CHEMOTHERAPY ALERT CARD or IMMUNOTHERAPY ALERT CARD at check-in to the  Emergency Department and triage nurse. Should you have questions after your visit or need to cancel or reschedule your appointment, please contact [facility]  [phone] and follow the prompts.  Office hours are 8:00 a.m. to 4:30 p.m. Monday - Friday. Please note that voicemails left after 4:00 p.m. may not be returned until the following business day.  We are closed weekends and major holidays. You have access to a nurse at all times for urgent questions. Please call the main number to the clinic [phone] and follow the prompts.  For any non-urgent questions, you may also contact your provider using MyChart. We now offer e-Visits for anyone 18 and older to request care online for non-urgent symptoms. For details visit [email or web address].   Also download the MyChart app! Go to the app store, search "MyChart", open the app, select [facility], and log in with your MyChart username and password.  Due to Covid, a mask is required upon entering the hospital/clinic. If you do not have a mask, one will be given to you upon arrival. For doctor visits, patients may have 1 support person aged 18 or older with them. For treatment visits, patients cannot have anyone with them due to current Covid guidelines and our immunocompromised population.  

## 2020-11-26 NOTE — Progress Notes (Signed)
Oncology Nurse Navigator Documentation  Oncology Nurse Navigator Flowsheets 11/26/2020  Abnormal Finding Date -  Confirmed Diagnosis Date -  Diagnosis Status -  Planned Course of Treatment -  Phase of Treatment -  Chemotherapy Pending- Reason: -  Chemo/Radiation Concurrent Actual Start Date: -  Navigator Follow Up Date: 12/16/2020  Navigator Follow Up Reason: Follow-up Appointment;Chemotherapy  Navigator Location [facility]  Referral Date to RadOnc/MedOnc -  Navigator Encounter Type Appt/Treatment Plan Review  Telephone -  Treatment Initiated Date -  Patient Visit Type MedOnc  Treatment Phase Active Tx  Barriers/Navigation Needs Coordination of Care;Education  Education -  Interventions None Required  Acuity Level 2-Minimal Needs (1-2 Barriers Identified)  Referrals -  Coordination of Care -  Education Method -  Support Groups/Services Friends and Family  Time Spent with Patient 15

## 2020-11-26 NOTE — Patient Instructions (Signed)
Implanted Port Home Guide An implanted port is a device that is placed under the skin. It is usually placed in the chest. The device can be used to give IV medicine, to take blood, or for dialysis. You may have an implanted port if: You need IV medicine that would be irritating to the small veins in your hands or arms. You need IV medicines, such as antibiotics, for a long period of time. You need IV nutrition for a long period of time. You need dialysis. When you have a port, your health care provider can choose to use the port instead of veins in your arms for these procedures. You may have fewer limitations when using a port than you would if you used other types of long-term IVs, and you will likely be able to return to normal activities afteryour incision heals. An implanted port has two main parts: Reservoir. The reservoir is the part where a needle is inserted to give medicines or draw blood. The reservoir is round. After it is placed, it appears as a small, raised area under your skin. Catheter. The catheter is a thin, flexible tube that connects the reservoir to a vein. Medicine that is inserted into the reservoir goes into the catheter and then into the vein. How is my port accessed? To access your port: A numbing cream may be placed on the skin over the port site. Your health care provider will put on a mask and sterile gloves. The skin over your port will be cleaned carefully with a germ-killing soap and allowed to dry. Your health care provider will gently pinch the port and insert a needle into it. Your health care provider will check for a blood return to make sure the port is in the vein and is not clogged. If your port needs to remain accessed to get medicine continuously (constant infusion), your health care provider will place a clear bandage (dressing) over the needle site. The dressing and needle will need to be changed every week, or as told by your health care provider. What  is flushing? Flushing helps keep the port from getting clogged. Follow instructions from your health care provider about how and when to flush the port. Ports are usually flushed with saline solution or a medicine called heparin. The need for flushing will depend on how the port is used: If the port is only used from time to time to give medicines or draw blood, the port may need to be flushed: Before and after medicines have been given. Before and after blood has been drawn. As part of routine maintenance. Flushing may be recommended every 4-6 weeks. If a constant infusion is running, the port may not need to be flushed. Throw away any syringes in a disposal container that is meant for sharp items (sharps container). You can buy a sharps container from a pharmacy, or you can make one by using an empty hard plastic bottle with a cover. How long will my port stay implanted? The port can stay in for as long as your health care provider thinks it is needed. When it is time for the port to come out, a surgery will be done to remove it. The surgery will be similar to the procedure that was done to putthe port in. Follow these instructions at home:  Flush your port as told by your health care provider. If you need an infusion over several days, follow instructions from your health care provider about how to take   care of your port site. Make sure you: Wash your hands with soap and water before you change your dressing. If soap and water are not available, use alcohol-based hand sanitizer. Change your dressing as told by your health care provider. Place any used dressings or infusion bags into a plastic bag. Throw that bag in the trash. Keep the dressing that covers the needle clean and dry. Do not get it wet. Do not use scissors or sharp objects near the tube. Keep the tube clamped, unless it is being used. Check your port site every day for signs of infection. Check for: Redness, swelling, or  pain. Fluid or blood. Pus or a bad smell. Protect the skin around the port site. Avoid wearing bra straps that rub or irritate the site. Protect the skin around your port from seat belts. Place a soft pad over your chest if needed. Bathe or shower as told by your health care provider. The site may get wet as long as you are not actively receiving an infusion. Return to your normal activities as told by your health care provider. Ask your health care provider what activities are safe for you. Carry a medical alert card or wear a medical alert bracelet at all times. This will let health care providers know that you have an implanted port in case of an emergency. Get help right away if: You have redness, swelling, or pain at the port site. You have fluid or blood coming from your port site. You have pus or a bad smell coming from the port site. You have a fever. Summary Implanted ports are usually placed in the chest for long-term IV access. Follow instructions from your health care provider about flushing the port and changing bandages (dressings). Take care of the area around your port by avoiding clothing that puts pressure on the area, and by watching for signs of infection. Protect the skin around your port from seat belts. Place a soft pad over your chest if needed. Get help right away if you have a fever or you have redness, swelling, pain, drainage, or a bad smell at the port site. This information is not intended to replace advice given to you by your health care provider. Make sure you discuss any questions you have with your healthcare provider. Document Revised: 10/14/2019 Document Reviewed: 10/14/2019 Elsevier Patient Education  2022 [employer]  

## 2020-11-27 ENCOUNTER — Other Ambulatory Visit (HOSPITAL_BASED_OUTPATIENT_CLINIC_OR_DEPARTMENT_OTHER): Payer: Self-pay

## 2020-11-27 LAB — IRON AND TIBC
Iron: 31 ug/dL — ABNORMAL LOW (ref 45–182)
Saturation Ratios: 12 % — ABNORMAL LOW (ref 17.9–39.5)
TIBC: 255 ug/dL (ref 250–450)
UIBC: 224 ug/dL

## 2020-11-27 LAB — FERRITIN: Ferritin: 202 ng/mL (ref 24–336)

## 2020-12-02 ENCOUNTER — Other Ambulatory Visit (HOSPITAL_BASED_OUTPATIENT_CLINIC_OR_DEPARTMENT_OTHER): Payer: Self-pay

## 2020-12-09 ENCOUNTER — Other Ambulatory Visit (HOSPITAL_BASED_OUTPATIENT_CLINIC_OR_DEPARTMENT_OTHER): Payer: Self-pay

## 2020-12-10 ENCOUNTER — Other Ambulatory Visit (HOSPITAL_BASED_OUTPATIENT_CLINIC_OR_DEPARTMENT_OTHER): Payer: Self-pay

## 2020-12-15 ENCOUNTER — Other Ambulatory Visit (HOSPITAL_BASED_OUTPATIENT_CLINIC_OR_DEPARTMENT_OTHER): Payer: Self-pay

## 2020-12-15 ENCOUNTER — Encounter: Payer: Self-pay | Admitting: Family

## 2020-12-15 ENCOUNTER — Encounter: Payer: Self-pay | Admitting: Hematology & Oncology

## 2020-12-15 ENCOUNTER — Other Ambulatory Visit: Payer: Self-pay | Admitting: Hematology & Oncology

## 2020-12-15 MED ORDER — OXYCODONE HCL 10 MG PO TABS
ORAL_TABLET | ORAL | 0 refills | Status: DC | PRN
  Filled 2020-12-15 (×2): qty 90, 15d supply, fill #0

## 2020-12-16 ENCOUNTER — Inpatient Hospital Stay: Payer: 59 | Attending: Hematology & Oncology

## 2020-12-16 ENCOUNTER — Inpatient Hospital Stay: Payer: 59

## 2020-12-16 ENCOUNTER — Other Ambulatory Visit (HOSPITAL_BASED_OUTPATIENT_CLINIC_OR_DEPARTMENT_OTHER): Payer: Self-pay

## 2020-12-16 ENCOUNTER — Encounter: Payer: Self-pay | Admitting: *Deleted

## 2020-12-16 ENCOUNTER — Other Ambulatory Visit: Payer: Self-pay

## 2020-12-16 ENCOUNTER — Inpatient Hospital Stay (HOSPITAL_BASED_OUTPATIENT_CLINIC_OR_DEPARTMENT_OTHER): Payer: 59 | Admitting: Hematology & Oncology

## 2020-12-16 ENCOUNTER — Encounter: Payer: Self-pay | Admitting: Hematology & Oncology

## 2020-12-16 VITALS — BP 123/69 | HR 79 | Temp 98.5°F | Resp 16 | Wt 162.0 lb

## 2020-12-16 DIAGNOSIS — C799 Secondary malignant neoplasm of unspecified site: Secondary | ICD-10-CM

## 2020-12-16 DIAGNOSIS — C679 Malignant neoplasm of bladder, unspecified: Secondary | ICD-10-CM

## 2020-12-16 DIAGNOSIS — E611 Iron deficiency: Secondary | ICD-10-CM | POA: Insufficient documentation

## 2020-12-16 DIAGNOSIS — Z5112 Encounter for antineoplastic immunotherapy: Secondary | ICD-10-CM | POA: Insufficient documentation

## 2020-12-16 DIAGNOSIS — D509 Iron deficiency anemia, unspecified: Secondary | ICD-10-CM

## 2020-12-16 LAB — CBC WITH DIFFERENTIAL (CANCER CENTER ONLY)
Abs Immature Granulocytes: 0.02 10*3/uL (ref 0.00–0.07)
Basophils Absolute: 0.1 10*3/uL (ref 0.0–0.1)
Basophils Relative: 1 %
Eosinophils Absolute: 0.6 10*3/uL — ABNORMAL HIGH (ref 0.0–0.5)
Eosinophils Relative: 7 %
HCT: 30 % — ABNORMAL LOW (ref 39.0–52.0)
Hemoglobin: 9.8 g/dL — ABNORMAL LOW (ref 13.0–17.0)
Immature Granulocytes: 0 %
Lymphocytes Relative: 9 %
Lymphs Abs: 0.8 10*3/uL (ref 0.7–4.0)
MCH: 31.7 pg (ref 26.0–34.0)
MCHC: 32.7 g/dL (ref 30.0–36.0)
MCV: 97.1 fL (ref 80.0–100.0)
Monocytes Absolute: 0.7 10*3/uL (ref 0.1–1.0)
Monocytes Relative: 8 %
Neutro Abs: 7 10*3/uL (ref 1.7–7.7)
Neutrophils Relative %: 75 %
Platelet Count: 216 10*3/uL (ref 150–400)
RBC: 3.09 MIL/uL — ABNORMAL LOW (ref 4.22–5.81)
RDW: 14.7 % (ref 11.5–15.5)
WBC Count: 9.3 10*3/uL (ref 4.0–10.5)
nRBC: 0 % (ref 0.0–0.2)

## 2020-12-16 LAB — CMP (CANCER CENTER ONLY)
ALT: 6 U/L (ref 0–44)
AST: 10 U/L — ABNORMAL LOW (ref 15–41)
Albumin: 3.9 g/dL (ref 3.5–5.0)
Alkaline Phosphatase: 60 U/L (ref 38–126)
Anion gap: 6 (ref 5–15)
BUN: 14 mg/dL (ref 8–23)
CO2: 28 mmol/L (ref 22–32)
Calcium: 10.1 mg/dL (ref 8.9–10.3)
Chloride: 103 mmol/L (ref 98–111)
Creatinine: 1.13 mg/dL (ref 0.61–1.24)
GFR, Estimated: >60 mL/min (ref >60)
Glucose, Bld: 114 mg/dL — ABNORMAL HIGH (ref 70–99)
Potassium: 3.9 mmol/L (ref 3.5–5.1)
Sodium: 137 mmol/L (ref 135–145)
Total Bilirubin: 0.4 mg/dL (ref 0.3–1.2)
Total Protein: 6.8 g/dL (ref 6.5–8.1)

## 2020-12-16 LAB — FERRITIN: Ferritin: 243 ng/mL (ref 24–336)

## 2020-12-16 LAB — RETICULOCYTES
Immature Retic Fract: 15.7 % (ref 2.3–15.9)
RBC.: 3.05 MIL/uL — ABNORMAL LOW (ref 4.22–5.81)
Retic Count, Absolute: 47.6 10*3/uL (ref 19.0–186.0)
Retic Ct Pct: 1.6 % (ref 0.4–3.1)

## 2020-12-16 LAB — IRON AND TIBC
Iron: 35 ug/dL — ABNORMAL LOW (ref 42–163)
Saturation Ratios: 16 % — ABNORMAL LOW (ref 20–55)
TIBC: 224 ug/dL (ref 202–409)
UIBC: 189 ug/dL (ref 117–376)

## 2020-12-16 LAB — LACTATE DEHYDROGENASE: LDH: 205 U/L — ABNORMAL HIGH (ref 98–192)

## 2020-12-16 MED ORDER — SODIUM CHLORIDE 0.9 % IV SOLN
200.0000 mg | Freq: Once | INTRAVENOUS | Status: AC
  Administered 2020-12-16: 200 mg via INTRAVENOUS
  Filled 2020-12-16: qty 8

## 2020-12-16 MED ORDER — HEPARIN SOD (PORK) LOCK FLUSH 100 UNIT/ML IV SOLN
500.0000 [IU] | Freq: Once | INTRAVENOUS | Status: AC | PRN
  Administered 2020-12-16: 500 [IU]
  Filled 2020-12-16: qty 5

## 2020-12-16 MED ORDER — FENTANYL 75 MCG/HR TD PT72
1.0000 | MEDICATED_PATCH | TRANSDERMAL | 0 refills | Status: DC
  Filled 2020-12-16 (×2): qty 10, 30d supply, fill #0

## 2020-12-16 MED ORDER — SODIUM CHLORIDE 0.9 % IV SOLN
Freq: Once | INTRAVENOUS | Status: AC
Start: 2020-12-16 — End: 2020-12-16
  Filled 2020-12-16: qty 250

## 2020-12-16 MED ORDER — SODIUM CHLORIDE 0.9% FLUSH
10.0000 mL | INTRAVENOUS | Status: DC | PRN
  Administered 2020-12-16: 10 mL
  Filled 2020-12-16: qty 10

## 2020-12-16 MED ORDER — SODIUM CHLORIDE 0.9 % IV SOLN
200.0000 mg | Freq: Once | INTRAVENOUS | Status: AC
  Administered 2020-12-16: 200 mg via INTRAVENOUS
  Filled 2020-12-16: qty 200

## 2020-12-16 NOTE — Addendum Note (Signed)
Addended by: Burney Gauze R on: 12/16/2020 09:56 AM   Modules accepted: Orders

## 2020-12-16 NOTE — Progress Notes (Signed)
Oncology Nurse Navigator Documentation  Oncology Nurse Navigator Flowsheets 12/16/2020  Abnormal Finding Date -  Confirmed Diagnosis Date -  Diagnosis Status -  Planned Course of Treatment -  Phase of Treatment -  Chemotherapy Pending- Reason: -  Chemo/Radiation Concurrent Actual Start Date: -  Navigator Follow Up Date: 01/13/2021  Navigator Follow Up Reason: Follow-up Appointment;Chemotherapy  Navigator Location [facility]  Referral Date to RadOnc/MedOnc -  Navigator Encounter Type Treatment;Appt/Treatment Plan Review  Telephone -  Treatment Initiated Date -  Patient Visit Type MedOnc  Treatment Phase Active Tx  Barriers/Navigation Needs Coordination of Care;Education  Education -  Interventions Psycho-Social Support  Acuity Level 2-Minimal Needs (1-2 Barriers Identified)  Referrals -  Coordination of Care -  Education Method -  Support Groups/Services Friends and Family  Time Spent with Patient 15

## 2020-12-16 NOTE — Patient Instructions (Signed)
Implanted Port Home Guide An implanted port is a device that is placed under the skin. It is usually placed in the chest. The device can be used to give IV medicine, to take blood, or for dialysis. You may have an implanted port if: You need IV medicine that would be irritating to the small veins in your hands or arms. You need IV medicines, such as antibiotics, for a long period of time. You need IV nutrition for a long period of time. You need dialysis. When you have a port, your health care provider can choose to use the port instead of veins in your arms for these procedures. You may have fewer limitations when using a port than you would if you used other types of long-term IVs, and you will likely be able to return to normal activities afteryour incision heals. An implanted port has two main parts: Reservoir. The reservoir is the part where a needle is inserted to give medicines or draw blood. The reservoir is round. After it is placed, it appears as a small, raised area under your skin. Catheter. The catheter is a thin, flexible tube that connects the reservoir to a vein. Medicine that is inserted into the reservoir goes into the catheter and then into the vein. How is my port accessed? To access your port: A numbing cream may be placed on the skin over the port site. Your health care provider will put on a mask and sterile gloves. The skin over your port will be cleaned carefully with a germ-killing soap and allowed to dry. Your health care provider will gently pinch the port and insert a needle into it. Your health care provider will check for a blood return to make sure the port is in the vein and is not clogged. If your port needs to remain accessed to get medicine continuously (constant infusion), your health care provider will place a clear bandage (dressing) over the needle site. The dressing and needle will need to be changed every week, or as told by your health care provider. What  is flushing? Flushing helps keep the port from getting clogged. Follow instructions from your health care provider about how and when to flush the port. Ports are usually flushed with saline solution or a medicine called heparin. The need for flushing will depend on how the port is used: If the port is only used from time to time to give medicines or draw blood, the port may need to be flushed: Before and after medicines have been given. Before and after blood has been drawn. As part of routine maintenance. Flushing may be recommended every 4-6 weeks. If a constant infusion is running, the port may not need to be flushed. Throw away any syringes in a disposal container that is meant for sharp items (sharps container). You can buy a sharps container from a pharmacy, or you can make one by using an empty hard plastic bottle with a cover. How long will my port stay implanted? The port can stay in for as long as your health care provider thinks it is needed. When it is time for the port to come out, a surgery will be done to remove it. The surgery will be similar to the procedure that was done to putthe port in. Follow these instructions at home:  Flush your port as told by your health care provider. If you need an infusion over several days, follow instructions from your health care provider about how to take   care of your port site. Make sure you: Wash your hands with soap and water before you change your dressing. If soap and water are not available, use alcohol-based hand sanitizer. Change your dressing as told by your health care provider. Place any used dressings or infusion bags into a plastic bag. Throw that bag in the trash. Keep the dressing that covers the needle clean and dry. Do not get it wet. Do not use scissors or sharp objects near the tube. Keep the tube clamped, unless it is being used. Check your port site every day for signs of infection. Check for: Redness, swelling, or  pain. Fluid or blood. Pus or a bad smell. Protect the skin around the port site. Avoid wearing bra straps that rub or irritate the site. Protect the skin around your port from seat belts. Place a soft pad over your chest if needed. Bathe or shower as told by your health care provider. The site may get wet as long as you are not actively receiving an infusion. Return to your normal activities as told by your health care provider. Ask your health care provider what activities are safe for you. Carry a medical alert card or wear a medical alert bracelet at all times. This will let health care providers know that you have an implanted port in case of an emergency. Get help right away if: You have redness, swelling, or pain at the port site. You have fluid or blood coming from your port site. You have pus or a bad smell coming from the port site. You have a fever. Summary Implanted ports are usually placed in the chest for long-term IV access. Follow instructions from your health care provider about flushing the port and changing bandages (dressings). Take care of the area around your port by avoiding clothing that puts pressure on the area, and by watching for signs of infection. Protect the skin around your port from seat belts. Place a soft pad over your chest if needed. Get help right away if you have a fever or you have redness, swelling, pain, drainage, or a bad smell at the port site. This information is not intended to replace advice given to you by your health care provider. Make sure you discuss any questions you have with your healthcare provider. Document Revised: 10/14/2019 Document Reviewed: 10/14/2019 Elsevier Patient Education  2022 [employer]  

## 2020-12-16 NOTE — Progress Notes (Signed)
Hematology and Oncology Follow Up Visit  Brian Benton [id number] 24-Oct-1955 65 y.o. 12/16/2020   Principle Diagnosis:  Metastatic bladder cancer-localized disease Extensive thrombus of the left leg-status post thrombectomy and iliac stent  Current Therapy:   Curative radiation with weekly cis-platinum-status post 6 cycle of weekly cis-platinum  --  Completed 04/08/2020  dd MVAC -- s/p cycle #3 -- started on 06/02/2020  --DC on 07/28/2020 secondary to toxicity Eliquis 5 mg p.o. twice daily Cis-platinum/Gemzar-s/p cycle #3 - start on 08/05/2020 - d/c on 10/08/2020 Pembrolizumab 200 mg/m2 IV q 3 week -- s/p cycle #3-- start on 10/14/2020     Interim History:  Brian Benton is back for a follow-up.  He did have a very nice time out in [place].  He was out there for a business trip.  He had no problems with traveling.  He went there and came back without any issues.  He did have some pain over the weekend.  He did have some physical therapy yesterday.  We will increase his Duragesic patch to 75 mcg.  I think that this will help him out.  He has had no problems with nausea or vomiting.  There is been no issues with diarrhea.  He may have had a little bit of constipation.  He has had no cough or shortness of breath.  He has had no bleeding.  There is been no headache.  He has had a little bit of swelling in the left leg.  However, the swelling was not worsened with his traveling.  He is on Eliquis.  The last time he was here, he did have iron deficiency.  We will give him some IV iron today.  His iron studies back in June showed a ferritin of 202 with an iron saturation of 12%.    Overall, his performance status is ECOG 1.      Medications:  Current Outpatient Medications:    apixaban (ELIQUIS) 5 MG TABS tablet, TAKE 1 TABLET (5 MG TOTAL) BY MOUTH 2 (TWO) TIMES DAILY., Disp: 60 tablet, Rfl: 5   ASPIRIN LOW DOSE 81 MG EC tablet, TAKE 1 TABLET BY MOUTH EVERY DAY, Disp: 30 tablet, Rfl: 11    Calcium Carbonate-Vit D-Min (CALTRATE PLUS PO), Take 1 tablet by mouth daily., Disp: , Rfl:    dexamethasone (DECADRON) 4 MG tablet, TAKE TWO TABLETS (8 MG TOTAL) BY MOUTH DAILY WITH FOOD FOR THREE DAYS STARTING THE DAY AFTER CISPLATIN CHEMOTHERAPY. (Patient not taking: No sig reported), Disp: 30 tablet, Rfl: 1   fentaNYL (DURAGESIC) 50 MCG/HR, PLACE 1 PATCH ONTO THE SKIN EVERY 3 (THREE) DAYS., Disp: 10 patch, Rfl: 0   gabapentin (NEURONTIN) 600 MG tablet, TAKE 1 TABLET (600 MG TOTAL) BY MOUTH IN THE MORNING, AT NOON, IN THE EVENING, AND AT BEDTIME., Disp: 120 tablet, Rfl: 3   Lactulose 20 GM/30ML SOLN, Take 30 mLs (20 g total) by mouth every 4 (four) hours as needed. (Patient not taking: No sig reported), Disp: 450 mL, Rfl: 2   lactulose, encephalopathy, (CHRONULAC) 10 GM/15ML SOLN, TAKE 30MLS BY MOUTH EVERY 4 HOURS AS NEEDED, Disp: 450 mL, Rfl: 2   lidocaine-prilocaine (EMLA) cream, Apply to affected area once, Disp: 30 g, Rfl: 3   LORazepam (ATIVAN) 0.5 MG tablet, TAKE 1 TABLET (0.5 MG TOTAL) BY MOUTH EVERY 6 (SIX) HOURS AS NEEDED FOR ANXIETY., Disp: 90 tablet, Rfl: 1   meloxicam (MOBIC) 15 MG tablet, TAKE 1 TABLET (15 MG TOTAL) BY MOUTH DAILY., Disp: 30 tablet,  Rfl: 3   Multiple Vitamins-Minerals (MULTIVITAMIN WITH MINERALS) tablet, Take 1 tablet by mouth daily., Disp: , Rfl:    ondansetron (ZOFRAN) 8 MG tablet, Take 1 tablet (8 mg total) by mouth 2 (two) times daily as needed (Nausea or vomiting). (Patient not taking: No sig reported), Disp: 30 tablet, Rfl: 1   ondansetron (ZOFRAN-ODT) 8 MG disintegrating tablet, TAKE 1 TABLET BY MOUTH EVERY 8 HOURS AS NEEDED FOR NAUSEA & VOMITING (Patient not taking: No sig reported), Disp: 30 tablet, Rfl: 0   orphenadrine (NORFLEX) 100 MG tablet, TAKE 1 TABLET (100 MG TOTAL) BY MOUTH 2 (TWO) TIMES DAILY., Disp: 60 tablet, Rfl: 0   Oxycodone HCl 10 MG TABS, TAKE 1 TABLET BY MOUTH EVERY 4 HOURS AS NEEDED, Disp: 90 tablet, Rfl: 0   pantoprazole (PROTONIX) 40 MG  tablet, TAKE 1 TABLET BY MOUTH ONCE DAILY, Disp: 30 tablet, Rfl: 3   predniSONE (DELTASONE) 10 MG tablet, Take 6 tablets by mouth on day 1, then 5 tabs day 2, then 4 tabs day 3, then 3 tabs day 4, then 2 tabs day 5, then 1 tab day 6. (Patient taking differently: Take 6 tablets by mouth on day 1, then 5 tabs day 2, then 4 tabs day 3, then 3 tabs day 4, then 2 tabs day 5, then 1 tab day 6.), Disp: 21 tablet, Rfl: 0   predniSONE (DELTASONE) 20 MG tablet, Take 40mg  daily for 7 days followed by 20mg  daily for 7 days. (Patient not taking: No sig reported), Disp: 21 tablet, Rfl: 0   prochlorperazine (COMPAZINE) 10 MG tablet, TAKE 1 TABLET (10 MG TOTAL) BY MOUTH EVERY 6 (SIX) HOURS AS NEEDED FOR NAUSEA OR VOMITING. (Patient not taking: No sig reported), Disp: 30 tablet, Rfl: 1   sodium chloride (OCEAN) 0.65 % SOLN nasal spray, Place 1 spray into both nostrils daily as needed for congestion (Allergies)., Disp: , Rfl:    zolpidem (AMBIEN) 10 MG tablet, TAKE 1 TABLET (10 MG TOTAL) BY MOUTH AT BEDTIME AS NEEDED FOR SLEEP., Disp: 30 tablet, Rfl: 0  Allergies: No Known Allergies  Past Medical History, Surgical history, Social history, and Family History were reviewed and updated.  Review of Systems: Review of Systems  Constitutional: Negative.   HENT:  Negative.    Eyes: Negative.   Respiratory: Negative.    Cardiovascular:  Positive for leg swelling.  Gastrointestinal: Negative.   Endocrine: Negative.   Musculoskeletal:  Positive for flank pain.  Skin: Negative.   Neurological: Negative.   Hematological: Negative.   Psychiatric/Behavioral: Negative.     Physical Exam:  weight is 162 lb (73.5 kg). His oral temperature is 98.5 F (36.9 C). His blood pressure is 123/69 and his pulse is 79. His respiration is 16 and oxygen saturation is 97%.   Wt Readings from Last 3 Encounters:  12/16/20 162 lb (73.5 kg)  11/26/20 160 lb (72.6 kg)  11/03/20 166 lb (75.3 kg)    Physical Exam Vitals reviewed.   HENT:     Head: Normocephalic and atraumatic.  Eyes:     Pupils: Pupils are equal, round, and reactive to light.  Cardiovascular:     Rate and Rhythm: Normal rate and regular rhythm.     Heart sounds: Normal heart sounds.  Pulmonary:     Effort: Pulmonary effort is normal.     Breath sounds: Normal breath sounds.  Abdominal:     General: Bowel sounds are normal.     Palpations: Abdomen is soft.  Musculoskeletal:  General: No tenderness or deformity. Normal range of motion.     Cervical back: Normal range of motion.     Comments: Extremities shows minimal swelling of the left leg.  He has good range of motion of the leg.  He has good pulses in his distal extremities.  The right leg is unremarkable.  Lymphadenopathy:     Cervical: No cervical adenopathy.  Skin:    General: Skin is warm and dry.     Findings: No erythema or rash.  Neurological:     Mental Status: He is alert and oriented to person, place, and time.  Psychiatric:        Behavior: Behavior normal.        Thought Content: Thought content normal.        Judgment: Judgment normal.   Lab Results  Component Value Date   WBC 9.3 12/16/2020   HGB 9.8 (L) 12/16/2020   HCT 30.0 (L) 12/16/2020   MCV 97.1 12/16/2020   PLT 216 12/16/2020     Chemistry      Component Value Date/Time   NA 137 12/16/2020 0847   K 3.9 12/16/2020 0847   CL 103 12/16/2020 0847   CO2 28 12/16/2020 0847   BUN 14 12/16/2020 0847   CREATININE 1.13 12/16/2020 0847      Component Value Date/Time   CALCIUM 10.1 12/16/2020 0847   ALKPHOS 60 12/16/2020 0847   AST 10 (L) 12/16/2020 0847   ALT 6 12/16/2020 0847   BILITOT 0.4 12/16/2020 0847      Impression and Plan: Mr. Macdonnell is a really nice 65 year old white male.  He has a very interesting problem.  He had in situ bladder cancer about 5 years ago.  Now, he has a recurrence that I would consider metastatic.  This is outside of the bladder.  It is not operable as it is appear to be  invading to the psoas muscle and also involving his lower spine.  We will proceed with his fourth  cycle of immunotherapy.  I will plan for follow-up scans after this cycle.  We will do MRI and a PET scan.  His quality of life is still doing okay.  Number he is able to go out to [place].  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    Volanda Napoleon, MD 7/6/20229:18 AM

## 2020-12-16 NOTE — Patient Instructions (Signed)
[facility]  Discharge Instructions: Thank you for choosing [facility] to provide your oncology and hematology care.   If you have a lab appointment with the [facility], please go directly to the [facility] and check in at the registration area.  Wear comfortable clothing and clothing appropriate for easy access to any Portacath or PICC line.   We strive to give you quality time with your provider. You may need to reschedule your appointment if you arrive late (15 or more minutes).  Arriving late affects you and other patients whose appointments are after yours.  Also, if you miss three or more appointments without notifying the office, you may be dismissed from the clinic at the provider's discretion.      For prescription refill requests, have your pharmacy contact our office and allow 72 hours for refills to be completed.    Today you received the following chemotherapy and/or immunotherapy agents Keytruda and Venofer    To help prevent nausea and vomiting after your treatment, we encourage you to take your nausea medication as directed.  BELOW ARE SYMPTOMS THAT SHOULD BE REPORTED IMMEDIATELY: *FEVER GREATER THAN 100.4 F (38 C) OR HIGHER *CHILLS OR SWEATING *NAUSEA AND VOMITING THAT IS NOT CONTROLLED WITH YOUR NAUSEA MEDICATION *UNUSUAL SHORTNESS OF BREATH *UNUSUAL BRUISING OR BLEEDING *URINARY PROBLEMS (pain or burning when urinating, or frequent urination) *BOWEL PROBLEMS (unusual diarrhea, constipation, pain near the anus) TENDERNESS IN MOUTH AND THROAT WITH OR WITHOUT PRESENCE OF ULCERS (sore throat, sores in mouth, or a toothache) UNUSUAL RASH, SWELLING OR PAIN  UNUSUAL VAGINAL DISCHARGE OR ITCHING   Items with * indicate a potential emergency and should be followed up as soon as possible or go to the Emergency Department if any problems should occur.  Please show the CHEMOTHERAPY ALERT CARD or IMMUNOTHERAPY ALERT CARD at  check-in to the Emergency Department and triage nurse. Should you have questions after your visit or need to cancel or reschedule your appointment, please contact [facility]  [phone] and follow the prompts.  Office hours are 8:00 a.m. to 4:30 p.m. Monday - Friday. Please note that voicemails left after 4:00 p.m. may not be returned until the following business day.  We are closed weekends and major holidays. You have access to a nurse at all times for urgent questions. Please call the main number to the clinic [phone] and follow the prompts.  For any non-urgent questions, you may also contact your provider using MyChart. We now offer e-Visits for anyone 60 and older to request care online for non-urgent symptoms. For details visit [email or web address].   Also download the MyChart app! Go to the app store, search "MyChart", open the app, select [facility], and log in with your MyChart username and password.  Due to Covid, a mask is required upon entering the hospital/clinic. If you do not have a mask, one will be given to you upon arrival. For doctor visits, patients may have 1 support person aged 48 or older with them. For treatment visits, patients cannot have anyone with them due to current Covid guidelines and our immunocompromised population.

## 2020-12-17 ENCOUNTER — Other Ambulatory Visit (HOSPITAL_BASED_OUTPATIENT_CLINIC_OR_DEPARTMENT_OTHER): Payer: Self-pay

## 2020-12-18 ENCOUNTER — Encounter: Payer: Self-pay | Admitting: *Deleted

## 2020-12-18 ENCOUNTER — Other Ambulatory Visit (HOSPITAL_BASED_OUTPATIENT_CLINIC_OR_DEPARTMENT_OTHER): Payer: Self-pay

## 2020-12-18 NOTE — Progress Notes (Signed)
Patient needs MRI and PET prior to his follow up appointment on 01/13/2021. Called patient and he would like me to schedule these appointments.  MRI scheduled for 01/04/2021. PET scheduled for 01/06/2021.   Called patient and reviewed appointment time, date and location for both scans. There is no prep for MRI, patient is educated to the prep for the PET. He knows to arrive at 8:30a for both scans. Radiology information sheet also mailed to patient home with same information for reinforcement.   Oncology Nurse Navigator Documentation  Oncology Nurse Navigator Flowsheets 12/18/2020  Abnormal Finding Date -  Confirmed Diagnosis Date -  Diagnosis Status -  Planned Course of Treatment -  Phase of Treatment -  Chemotherapy Pending- Reason: -  Chemo/Radiation Concurrent Actual Start Date: -  Navigator Follow Up Date: 01/04/2021  Navigator Follow Up Reason: Scan Review  Navigator Location [facility]  Referral Date to RadOnc/MedOnc -  Navigator Encounter Type Telephone  Telephone Appt Confirmation/Clarification;Education;Outgoing Call  Treatment Initiated Date -  Patient Visit Type MedOnc  Treatment Phase Active Tx  Barriers/Navigation Needs Coordination of Care;Education  Education Other  Interventions Coordination of Care;Education;Psycho-Social Support  Acuity Level 2-Minimal Needs (1-2 Barriers Identified)  Referrals -  Coordination of Care Appts;Radiology  Education Method Teach-back;Verbal;Written  Support Groups/Services Friends and Family  Time Spent with Patient 30

## 2020-12-22 ENCOUNTER — Other Ambulatory Visit: Payer: Self-pay | Admitting: Hematology & Oncology

## 2020-12-22 ENCOUNTER — Other Ambulatory Visit (HOSPITAL_BASED_OUTPATIENT_CLINIC_OR_DEPARTMENT_OTHER): Payer: Self-pay

## 2020-12-22 DIAGNOSIS — C799 Secondary malignant neoplasm of unspecified site: Secondary | ICD-10-CM

## 2020-12-22 DIAGNOSIS — C679 Malignant neoplasm of bladder, unspecified: Secondary | ICD-10-CM

## 2020-12-22 MED ORDER — LORAZEPAM 0.5 MG PO TABS
ORAL_TABLET | ORAL | 1 refills | Status: DC
  Filled 2020-12-22: qty 90, 23d supply, fill #0

## 2020-12-22 MED ORDER — ZOLPIDEM TARTRATE 10 MG PO TABS
ORAL_TABLET | Freq: Every evening | ORAL | 0 refills | Status: DC | PRN
Start: 2020-12-22 — End: 2021-01-13
  Filled 2020-12-22 – 2020-12-24 (×2): qty 30, 30d supply, fill #0

## 2020-12-23 ENCOUNTER — Other Ambulatory Visit (HOSPITAL_BASED_OUTPATIENT_CLINIC_OR_DEPARTMENT_OTHER): Payer: Self-pay

## 2020-12-23 ENCOUNTER — Other Ambulatory Visit: Payer: Self-pay | Admitting: *Deleted

## 2020-12-23 MED ORDER — GABAPENTIN 600 MG PO TABS: ORAL_TABLET | ORAL | 3 refills | Status: DC

## 2020-12-24 ENCOUNTER — Other Ambulatory Visit (HOSPITAL_BASED_OUTPATIENT_CLINIC_OR_DEPARTMENT_OTHER): Payer: Self-pay

## 2020-12-30 ENCOUNTER — Other Ambulatory Visit (HOSPITAL_BASED_OUTPATIENT_CLINIC_OR_DEPARTMENT_OTHER): Payer: Self-pay

## 2020-12-30 ENCOUNTER — Other Ambulatory Visit: Payer: Self-pay | Admitting: Hematology & Oncology

## 2020-12-30 MED ORDER — OXYCODONE HCL 10 MG PO TABS
ORAL_TABLET | ORAL | 0 refills | Status: DC | PRN
  Filled 2020-12-30 – 2020-12-31 (×2): qty 90, 15d supply, fill #0

## 2020-12-30 MED FILL — Apixaban Tab 5 MG: ORAL | 30 days supply | Qty: 60 | Fill #3 | Status: AC

## 2020-12-31 ENCOUNTER — Other Ambulatory Visit (HOSPITAL_BASED_OUTPATIENT_CLINIC_OR_DEPARTMENT_OTHER): Payer: Self-pay

## 2021-01-01 ENCOUNTER — Other Ambulatory Visit (HOSPITAL_BASED_OUTPATIENT_CLINIC_OR_DEPARTMENT_OTHER): Payer: Self-pay

## 2021-01-04 ENCOUNTER — Other Ambulatory Visit: Payer: Self-pay

## 2021-01-04 ENCOUNTER — Ambulatory Visit (HOSPITAL_COMMUNITY)
Admission: RE | Admit: 2021-01-04 | Discharge: 2021-01-04 | Disposition: A | Discharge status: Home / Self Care | Payer: 59 | Source: Ambulatory Visit | Attending: Hematology & Oncology | Admitting: Hematology & Oncology

## 2021-01-04 DIAGNOSIS — C679 Malignant neoplasm of bladder, unspecified: Secondary | ICD-10-CM | POA: Insufficient documentation

## 2021-01-04 DIAGNOSIS — C799 Secondary malignant neoplasm of unspecified site: Secondary | ICD-10-CM | POA: Insufficient documentation

## 2021-01-04 MED ORDER — GADOBUTROL 1 MMOL/ML IV SOLN
7.0000 mL | Freq: Once | INTRAVENOUS | Status: AC | PRN
  Administered 2021-01-04: 7 mL via INTRAVENOUS

## 2021-01-05 ENCOUNTER — Other Ambulatory Visit: Payer: Self-pay | Admitting: Hematology & Oncology

## 2021-01-05 ENCOUNTER — Telehealth: Payer: Self-pay | Admitting: *Deleted

## 2021-01-05 ENCOUNTER — Other Ambulatory Visit (HOSPITAL_BASED_OUTPATIENT_CLINIC_OR_DEPARTMENT_OTHER): Payer: Self-pay

## 2021-01-05 ENCOUNTER — Encounter: Payer: Self-pay | Admitting: *Deleted

## 2021-01-05 DIAGNOSIS — C799 Secondary malignant neoplasm of unspecified site: Secondary | ICD-10-CM

## 2021-01-05 MED ORDER — GABAPENTIN 600 MG PO TABS
1200.0000 mg | ORAL_TABLET | Freq: Four times a day (QID) | ORAL | 3 refills | Status: DC
  Filled 2021-01-05: qty 240, 30d supply, fill #0
  Filled 2021-02-02: qty 240, 30d supply, fill #1

## 2021-01-05 MED ORDER — LORAZEPAM 0.5 MG PO TABS
1.0000 mg | ORAL_TABLET | Freq: Four times a day (QID) | ORAL | 1 refills | Status: DC | PRN
  Filled 2021-01-05: qty 120, 15d supply, fill #0
  Filled 2021-01-27: qty 120, 15d supply, fill #1

## 2021-01-05 NOTE — Telephone Encounter (Signed)
Message received from patient's wife requesting Neurontin refill to increase to 1200 mg four times daily and Ativan to 1 mg four times a day as needed.  Dr. Marin Olp notified and order received to refill medications as requested above.  Pt requests refills be sent to [facility].

## 2021-01-05 NOTE — Progress Notes (Signed)
Oncology Nurse Navigator Documentation  Oncology Nurse Navigator Flowsheets 01/05/2021  Abnormal Finding Date -  Confirmed Diagnosis Date -  Diagnosis Status -  Planned Course of Treatment -  Phase of Treatment -  Chemotherapy Pending- Reason: -  Chemo/Radiation Concurrent Actual Start Date: -  Navigator Follow Up Date: 01/06/2021  Navigator Follow Up Reason: Scan Review  Navigator Location [facility]  Referral Date to RadOnc/MedOnc -  Navigator Encounter Type Scan Review  Telephone -  Treatment Initiated Date -  Patient Visit Type MedOnc  Treatment Phase Active Tx  Barriers/Navigation Needs Coordination of Care;Education  Education -  Interventions None Required  Acuity Level 2-Minimal Needs (1-2 Barriers Identified)  Referrals -  Coordination of Care -  Education Method -  Support Groups/Services Friends and Family  Time Spent with Patient 15

## 2021-01-06 ENCOUNTER — Other Ambulatory Visit (HOSPITAL_BASED_OUTPATIENT_CLINIC_OR_DEPARTMENT_OTHER): Payer: Self-pay

## 2021-01-06 ENCOUNTER — Ambulatory Visit (HOSPITAL_COMMUNITY)
Admission: RE | Admit: 2021-01-06 | Discharge: 2021-01-06 | Disposition: A | Discharge status: Home / Self Care | Payer: 59 | Source: Ambulatory Visit | Attending: Hematology & Oncology | Admitting: Hematology & Oncology

## 2021-01-06 ENCOUNTER — Other Ambulatory Visit: Payer: Self-pay

## 2021-01-06 DIAGNOSIS — C799 Secondary malignant neoplasm of unspecified site: Secondary | ICD-10-CM | POA: Insufficient documentation

## 2021-01-06 DIAGNOSIS — C679 Malignant neoplasm of bladder, unspecified: Secondary | ICD-10-CM | POA: Diagnosis present

## 2021-01-06 LAB — GLUCOSE, CAPILLARY: Glucose-Capillary: 75 mg/dL (ref 70–99)

## 2021-01-06 MED ORDER — FLUDEOXYGLUCOSE F - 18 (FDG) INJECTION
8.2000 | Freq: Once | INTRAVENOUS | Status: AC
  Administered 2021-01-06: 8.07 via INTRAVENOUS

## 2021-01-07 ENCOUNTER — Encounter: Payer: Self-pay | Admitting: *Deleted

## 2021-01-07 ENCOUNTER — Inpatient Hospital Stay: Payer: 59

## 2021-01-07 ENCOUNTER — Inpatient Hospital Stay: Payer: 59 | Admitting: Hematology & Oncology

## 2021-01-07 NOTE — Progress Notes (Signed)
Oncology Nurse Navigator Documentation  Oncology Nurse Navigator Flowsheets 01/07/2021  Abnormal Finding Date -  Confirmed Diagnosis Date -  Diagnosis Status -  Planned Course of Treatment -  Phase of Treatment -  Chemotherapy Pending- Reason: -  Chemo/Radiation Concurrent Actual Start Date: -  Navigator Follow Up Date: 01/13/2021  Navigator Follow Up Reason: Follow-up Appointment;Chemotherapy  Navigator Location [facility]  Referral Date to RadOnc/MedOnc -  Navigator Encounter Type Scan Review;MyChart  Telephone -  Treatment Initiated Date -  Patient Visit Type MedOnc  Treatment Phase Active Tx  Barriers/Navigation Needs Coordination of Care;Education  Education Other  Interventions Education;Psycho-Social Support  Acuity Level 2-Minimal Needs (1-2 Barriers Identified)  Referrals -  Coordination of Care -  Education Method Written  Support Groups/Services Friends and Family  Time Spent with Patient 30

## 2021-01-08 ENCOUNTER — Other Ambulatory Visit: Payer: Self-pay | Admitting: Hematology & Oncology

## 2021-01-08 NOTE — Progress Notes (Signed)
DISCONTINUE OFF PATHWAY REGIMEN - Bladder   [id number]:Pembrolizumab 200 mg IV D1 q21 Days:   A cycle is every 21 days:     Pembrolizumab   **Always confirm dose/schedule in your pharmacy ordering system**  REASON: Disease Progression PRIOR TREATMENT: Off Pathway: Pembrolizumab 200 mg IV D1 q21 Days TREATMENT RESPONSE: Progressive Disease (PD)  START ON PATHWAY REGIMEN - Bladder     A cycle is every 28 days:     Enfortumab vedotin-ejfv   **Always confirm dose/schedule in your pharmacy ordering system**  Patient Characteristics: Advanced/Metastatic Disease, Third Line and Beyond Therapeutic Status: Advanced/Metastatic Disease Line of Therapy: Third Line and Beyond  Intent of Therapy: Non-Curative / Palliative Intent, Discussed with Patient

## 2021-01-11 ENCOUNTER — Other Ambulatory Visit (HOSPITAL_BASED_OUTPATIENT_CLINIC_OR_DEPARTMENT_OTHER): Payer: Self-pay

## 2021-01-13 ENCOUNTER — Inpatient Hospital Stay: Payer: 59 | Attending: Hematology & Oncology

## 2021-01-13 ENCOUNTER — Inpatient Hospital Stay: Payer: 59

## 2021-01-13 ENCOUNTER — Other Ambulatory Visit: Payer: Self-pay | Admitting: Hematology & Oncology

## 2021-01-13 ENCOUNTER — Encounter: Payer: Self-pay | Admitting: Hematology & Oncology

## 2021-01-13 ENCOUNTER — Other Ambulatory Visit: Payer: Self-pay

## 2021-01-13 ENCOUNTER — Other Ambulatory Visit (HOSPITAL_BASED_OUTPATIENT_CLINIC_OR_DEPARTMENT_OTHER): Payer: Self-pay

## 2021-01-13 ENCOUNTER — Telehealth: Payer: Self-pay

## 2021-01-13 ENCOUNTER — Other Ambulatory Visit: Payer: Self-pay | Admitting: Family

## 2021-01-13 ENCOUNTER — Encounter: Payer: Self-pay | Admitting: Family

## 2021-01-13 ENCOUNTER — Inpatient Hospital Stay (HOSPITAL_BASED_OUTPATIENT_CLINIC_OR_DEPARTMENT_OTHER): Payer: 59 | Admitting: Hematology & Oncology

## 2021-01-13 DIAGNOSIS — C679 Malignant neoplasm of bladder, unspecified: Secondary | ICD-10-CM | POA: Insufficient documentation

## 2021-01-13 DIAGNOSIS — C799 Secondary malignant neoplasm of unspecified site: Secondary | ICD-10-CM

## 2021-01-13 DIAGNOSIS — Z5112 Encounter for antineoplastic immunotherapy: Secondary | ICD-10-CM | POA: Diagnosis present

## 2021-01-13 DIAGNOSIS — D649 Anemia, unspecified: Secondary | ICD-10-CM

## 2021-01-13 DIAGNOSIS — D509 Iron deficiency anemia, unspecified: Secondary | ICD-10-CM | POA: Diagnosis not present

## 2021-01-13 LAB — CBC WITH DIFFERENTIAL (CANCER CENTER ONLY)
Abs Immature Granulocytes: 0.05 10*3/uL (ref 0.00–0.07)
Basophils Absolute: 0.1 10*3/uL (ref 0.0–0.1)
Basophils Relative: 1 %
Eosinophils Absolute: 0.6 10*3/uL — ABNORMAL HIGH (ref 0.0–0.5)
Eosinophils Relative: 6 %
HCT: 27.6 % — ABNORMAL LOW (ref 39.0–52.0)
Hemoglobin: 9 g/dL — ABNORMAL LOW (ref 13.0–17.0)
Immature Granulocytes: 1 %
Lymphocytes Relative: 9 %
Lymphs Abs: 0.9 10*3/uL (ref 0.7–4.0)
MCH: 30.5 pg (ref 26.0–34.0)
MCHC: 32.6 g/dL (ref 30.0–36.0)
MCV: 93.6 fL (ref 80.0–100.0)
Monocytes Absolute: 0.6 10*3/uL (ref 0.1–1.0)
Monocytes Relative: 6 %
Neutro Abs: 8.1 10*3/uL — ABNORMAL HIGH (ref 1.7–7.7)
Neutrophils Relative %: 77 %
Platelet Count: 208 10*3/uL (ref 150–400)
RBC: 2.95 MIL/uL — ABNORMAL LOW (ref 4.22–5.81)
RDW: 15.4 % (ref 11.5–15.5)
WBC Count: 10.4 10*3/uL (ref 4.0–10.5)
nRBC: 0 % (ref 0.0–0.2)

## 2021-01-13 LAB — RETICULOCYTES
Immature Retic Fract: 17.6 % — ABNORMAL HIGH (ref 2.3–15.9)
RBC.: 3 MIL/uL — ABNORMAL LOW (ref 4.22–5.81)
Retic Count, Absolute: 56.4 10*3/uL (ref 19.0–186.0)
Retic Ct Pct: 1.9 % (ref 0.4–3.1)

## 2021-01-13 LAB — CMP (CANCER CENTER ONLY)
ALT: 8 U/L (ref 0–44)
AST: 13 U/L — ABNORMAL LOW (ref 15–41)
Albumin: 3.7 g/dL (ref 3.5–5.0)
Alkaline Phosphatase: 68 U/L (ref 38–126)
Anion gap: 6 (ref 5–15)
BUN: 18 mg/dL (ref 8–23)
CO2: 29 mmol/L (ref 22–32)
Calcium: 10.1 mg/dL (ref 8.9–10.3)
Chloride: 106 mmol/L (ref 98–111)
Creatinine: 1 mg/dL (ref 0.61–1.24)
GFR, Estimated: >60 mL/min (ref >60)
Glucose, Bld: 89 mg/dL (ref 70–99)
Potassium: 3.8 mmol/L (ref 3.5–5.1)
Sodium: 141 mmol/L (ref 135–145)
Total Bilirubin: 0.4 mg/dL (ref 0.3–1.2)
Total Protein: 6.8 g/dL (ref 6.5–8.1)

## 2021-01-13 LAB — PREPARE RBC (CROSSMATCH)

## 2021-01-13 LAB — LACTATE DEHYDROGENASE: LDH: 216 U/L — ABNORMAL HIGH (ref 98–192)

## 2021-01-13 MED ORDER — SODIUM CHLORIDE 0.9 % IV SOLN
1.2300 mg/kg | Freq: Once | INTRAVENOUS | Status: AC
  Administered 2021-01-13: 90 mg via INTRAVENOUS
  Filled 2021-01-13: qty 6

## 2021-01-13 MED ORDER — FENTANYL 100 MCG/HR TD PT72
1.0000 | MEDICATED_PATCH | TRANSDERMAL | 0 refills | Status: DC
  Filled 2021-01-13 (×3): qty 15, 30d supply, fill #0
  Filled 2021-01-15: qty 5, 10d supply, fill #0
  Filled 2021-01-26: qty 10, 20d supply, fill #1

## 2021-01-13 MED ORDER — TEMAZEPAM 15 MG PO CAPS
15.0000 mg | ORAL_CAPSULE | Freq: Every evening | ORAL | 0 refills | Status: DC | PRN
  Filled 2021-01-13: qty 30, 30d supply, fill #0

## 2021-01-13 MED ORDER — DEXAMETHASONE 4 MG PO TABS: 8.0000 mg | ORAL_TABLET | Freq: Every day | ORAL | 1 refills | Status: DC

## 2021-01-13 MED ORDER — LIDOCAINE-PRILOCAINE 2.5-2.5 % EX CREA
TOPICAL_CREAM | CUTANEOUS | 3 refills | Status: AC
Start: 2021-01-13 — End: ?

## 2021-01-13 MED ORDER — HEPARIN SOD (PORK) LOCK FLUSH 100 UNIT/ML IV SOLN
500.0000 [IU] | Freq: Once | INTRAVENOUS | Status: AC | PRN
  Administered 2021-01-13: 500 [IU]
  Filled 2021-01-13: qty 5

## 2021-01-13 MED ORDER — PALONOSETRON HCL INJECTION 0.25 MG/5ML
INTRAVENOUS | Status: AC
  Filled 2021-01-13: qty 5

## 2021-01-13 MED ORDER — LORAZEPAM 0.5 MG PO TABS: 0.5000 mg | ORAL_TABLET | Freq: Four times a day (QID) | ORAL | 0 refills | Status: DC | PRN

## 2021-01-13 MED ORDER — SODIUM CHLORIDE 0.9 % IV SOLN
40.0000 mg | Freq: Once | INTRAVENOUS | Status: AC
  Administered 2021-01-13: 40 mg via INTRAVENOUS
  Filled 2021-01-13: qty 4

## 2021-01-13 MED ORDER — PANTOPRAZOLE SODIUM 40 MG PO TBEC
40.0000 mg | DELAYED_RELEASE_TABLET | Freq: Two times a day (BID) | ORAL | 3 refills | Status: AC
  Filled 2021-01-13: qty 60, 30d supply, fill #0
  Filled 2021-02-13: qty 60, 30d supply, fill #1

## 2021-01-13 MED ORDER — PALONOSETRON HCL INJECTION 0.25 MG/5ML
0.2500 mg | Freq: Once | INTRAVENOUS | Status: AC
  Administered 2021-01-13: 0.25 mg via INTRAVENOUS

## 2021-01-13 MED ORDER — DEXAMETHASONE 6 MG PO TABS
18.0000 mg | ORAL_TABLET | Freq: Every day | ORAL | 4 refills | Status: DC
  Filled 2021-01-13: qty 90, 30d supply, fill #0
  Filled 2021-02-10: qty 90, 30d supply, fill #1

## 2021-01-13 MED ORDER — HYDROMORPHONE HCL 8 MG PO TABS
8.0000 mg | ORAL_TABLET | Freq: Four times a day (QID) | ORAL | 0 refills | Status: DC | PRN
  Filled 2021-01-13: qty 120, 30d supply, fill #0

## 2021-01-13 MED ORDER — SODIUM CHLORIDE 0.9 % IV SOLN
Freq: Once | INTRAVENOUS | Status: AC
  Filled 2021-01-13: qty 250

## 2021-01-13 MED ORDER — PROCHLORPERAZINE MALEATE 10 MG PO TABS: 10.0000 mg | ORAL_TABLET | Freq: Four times a day (QID) | ORAL | 1 refills | Status: AC | PRN

## 2021-01-13 MED ORDER — SODIUM CHLORIDE 0.9% FLUSH
10.0000 mL | INTRAVENOUS | Status: DC | PRN
  Administered 2021-01-13: 10 mL
  Filled 2021-01-13: qty 10

## 2021-01-13 MED ORDER — ONDANSETRON HCL 8 MG PO TABS: 8.0000 mg | ORAL_TABLET | Freq: Two times a day (BID) | ORAL | 1 refills | Status: AC | PRN

## 2021-01-13 NOTE — Telephone Encounter (Signed)
Spoke with patient by phone.  See note.

## 2021-01-13 NOTE — Telephone Encounter (Signed)
Appts made per 01/13/21 los, pt to gain updated sch at [employer] and through [employer]

## 2021-01-13 NOTE — Progress Notes (Signed)
Hematology and Oncology Follow Up Visit  Brian Benton XB:[id number] 1956/01/10 65 y.o. 01/13/2021   Principle Diagnosis:  Metastatic bladder cancer-localized disease Extensive thrombus of the left leg-status post thrombectomy and iliac stent  Current Therapy:   Curative radiation with weekly cis-platinum-status post 6 cycle of weekly cis-platinum  --  Completed 04/08/2020  dd MVAC -- s/p cycle #3 -- started on 06/02/2020  --DC on 07/28/2020 secondary to toxicity Eliquis 5 mg p.o. twice daily Cis-platinum/Gemzar-s/p cycle #3 - start on 08/05/2020 - d/c on 10/08/2020 Pembrolizumab 200 mg/m2 IV q 3 week -- s/p cycle #3-- start on 10/14/2020 -- d/c due to progression Padcev -- start cycle #1 on 01/13/2021     Interim History:  Brian Benton is back for a follow-up.  Unfortunately, Brian Benton is having more of difficulty now.  His tumor is clearly progressing.  We did a PET scan and MRI.  Both showed that the tumor was progressing.  By the PET scan, the tumor probably progressed by almost doubled or tripled in size.  It also increased markedly in intensity.  Brian Benton is having more pain.  The pain is becoming much more difficult to control now.  They are going to have to increase his fentanyl patch up to 100 mcg every other day.  I will change out the oxycodone and try him on Dilaudid at 8 mg p.o. every 6 hours as needed.  Now that Brian Benton is off immunotherapy, we can try him on some steroids.  I will give him a dose of Decadron at 40 mg IV in the office and then put him on Decadron at 18 mg p.o. daily at home.  His hemoglobin is 9.  I do think that this is quite low for him.  I think Brian Benton would benefit from a blood transfusion.  This may help his pain a little bit.  His wife, needs to have neck surgery.  I think she is going to have neck surgery next week.  I think family is coming in to try to help her out.  I know that Brian Benton is trying his best.  I know that Brian Benton has been through quite a lot of therapy.  Think 1  option that we have is Padcev.  Brian Benton has not yet had Padcev.  I think this might be worthwhile.  Should be tolerable.  His appetite is okay.  Brian Benton has had no nausea or vomiting.  Brian Benton has had no constipation.  In fact, there might be a little bit of diarrhea.  Brian Benton has had no fever.  Brian Benton has had no obvious bleeding.  His wife is wondering if Brian Benton could have surgery for the tumor.  I told her that Brian Benton had already been seen at [facility] by the spine surgery group who does surgical oncology.  It did not sound like they were all that enthusiastic about operating on him.  However, I suppose we could always ask them again if they would consider considering his pain issues.  Brian Benton is on Eliquis for the thrombus in his left leg.  Brian Benton is doing okay on this.  Overall, I would say his performance status is probably ECOG 2.     Medications:  Current Outpatient Medications:    apixaban (ELIQUIS) 5 MG TABS tablet, TAKE 1 TABLET (5 MG TOTAL) BY MOUTH 2 (TWO) TIMES DAILY., Disp: 60 tablet, Rfl: 5   ASPIRIN LOW DOSE 81 MG EC tablet, TAKE 1 TABLET BY MOUTH EVERY DAY, Disp: 30 tablet, Rfl: 11  Calcium Carbonate-Vit D-Min (CALTRATE PLUS PO), Take 1 tablet by mouth daily., Disp: , Rfl:    dexamethasone (DECADRON) 6 MG tablet, Take 3 tablets (18 mg total) by mouth daily. **You MUST take with food in the morning.**, Disp: 90 tablet, Rfl: 4   fentaNYL (DURAGESIC) 100 MCG/HR, Place 1 patch onto the skin every other day., Disp: 15 patch, Rfl: 0   gabapentin (NEURONTIN) 600 MG tablet, Take 2 tablets (1,200 mg total) by mouth in the morning, at noon, in the evening, and at bedtime., Disp: 240 tablet, Rfl: 3   HYDROmorphone (DILAUDID) 8 MG tablet, Take 1 tablet (8 mg total) by mouth every 6 (six) hours as needed for severe pain., Disp: 120 tablet, Rfl: 0   LORazepam (ATIVAN) 0.5 MG tablet, Take 2 tablets (1 mg total) by mouth every 6 (six) hours as needed for anxiety., Disp: 120 tablet, Rfl: 1   Multiple Vitamins-Minerals (MULTIVITAMIN WITH  MINERALS) tablet, Take 1 tablet by mouth daily., Disp: , Rfl:    sodium chloride (OCEAN) 0.65 % SOLN nasal spray, Place 1 spray into both nostrils daily as needed for congestion (Allergies)., Disp: , Rfl:    temazepam (RESTORIL) 15 MG capsule, Take 1 capsule (15 mg total) by mouth at bedtime as needed for sleep., Disp: 30 capsule, Rfl: 0   dexamethasone (DECADRON) 4 MG tablet, Take 2 tablets (8 mg total) by mouth daily. Start the day after chemotherapy for 2 days., Disp: 30 tablet, Rfl: 1   gabapentin (NEURONTIN) 600 MG tablet, TAKE 1-2 TABLETS (600 MG TOTAL) BY MOUTH IN THE MORNING, AT NOON, IN THE EVENING,AND AT BEDTIME (Patient not taking: Reported on 01/13/2021), Disp: 120 tablet, Rfl: 3   lactulose, encephalopathy, (CHRONULAC) 10 GM/15ML SOLN, TAKE 30MLS BY MOUTH EVERY 4 HOURS AS NEEDED (Patient not taking: Reported on 01/13/2021), Disp: 450 mL, Rfl: 2   lidocaine-prilocaine (EMLA) cream, Apply to affected area once, Disp: 30 g, Rfl: 3   LORazepam (ATIVAN) 0.5 MG tablet, Take 1 tablet (0.5 mg total) by mouth every 6 (six) hours as needed (Nausea or vomiting)., Disp: 30 tablet, Rfl: 0   ondansetron (ZOFRAN) 8 MG tablet, Take 1 tablet (8 mg total) by mouth 2 (two) times daily as needed for refractory nausea / vomiting. Start on day 3 after chemo., Disp: 30 tablet, Rfl: 1   ondansetron (ZOFRAN-ODT) 8 MG disintegrating tablet, TAKE 1 TABLET BY MOUTH EVERY 8 HOURS AS NEEDED FOR NAUSEA & VOMITING (Patient not taking: No sig reported), Disp: 30 tablet, Rfl: 0   orphenadrine (NORFLEX) 100 MG tablet, TAKE 1 TABLET (100 MG TOTAL) BY MOUTH 2 (TWO) TIMES DAILY. (Patient not taking: Reported on 01/13/2021), Disp: 60 tablet, Rfl: 0   pantoprazole (PROTONIX) 40 MG tablet, Take 1 tablet (40 mg total) by mouth 2 (two) times daily., Disp: 60 tablet, Rfl: 3   prochlorperazine (COMPAZINE) 10 MG tablet, TAKE 1 TABLET (10 MG TOTAL) BY MOUTH EVERY 6 (SIX) HOURS AS NEEDED FOR NAUSEA OR VOMITING. (Patient not taking: No sig  reported), Disp: 30 tablet, Rfl: 1   prochlorperazine (COMPAZINE) 10 MG tablet, Take 1 tablet (10 mg total) by mouth every 6 (six) hours as needed (Nausea or vomiting)., Disp: 30 tablet, Rfl: 1 No current facility-administered medications for this visit.  Facility-Administered Medications Ordered in Other Visits:    enfortumab vedotin-ejfv (PADCEV) 90 mg in sodium chloride 0.9 % 50 mL (1.5254 mg/mL) chemo infusion, 1.23 mg/kg (Treatment Plan Recorded), Intravenous, Once, Cristofher Livecchi, Rudell Cobb, MD   heparin lock flush  100 unit/mL, 500 Units, Intracatheter, Once PRN, Clever Geraldo, Rudell Cobb, MD   sodium chloride flush (NS) 0.9 % injection 10 mL, 10 mL, Intracatheter, PRN, Marin Olp, Rudell Cobb, MD  Allergies: No Known Allergies  Past Medical History, Surgical history, Social history, and Family History were reviewed and updated.  Review of Systems: Review of Systems  Constitutional: Negative.   HENT:  Negative.    Eyes: Negative.   Respiratory: Negative.    Cardiovascular:  Positive for leg swelling.  Gastrointestinal: Negative.   Endocrine: Negative.   Musculoskeletal:  Positive for flank pain.  Skin: Negative.   Neurological: Negative.   Hematological: Negative.   Psychiatric/Behavioral: Negative.     Physical Exam:  vitals were not taken for this visit.   Wt Readings from Last 3 Encounters:  01/13/21 151 lb (68.5 kg)  12/16/20 162 lb (73.5 kg)  11/26/20 160 lb (72.6 kg)    Physical Exam Vitals reviewed.  HENT:     Head: Normocephalic and atraumatic.  Eyes:     Pupils: Pupils are equal, round, and reactive to light.  Cardiovascular:     Rate and Rhythm: Normal rate and regular rhythm.     Heart sounds: Normal heart sounds.  Pulmonary:     Effort: Pulmonary effort is normal.     Breath sounds: Normal breath sounds.  Abdominal:     General: Bowel sounds are normal.     Palpations: Abdomen is soft.  Musculoskeletal:        General: No tenderness or deformity. Normal range of motion.      Cervical back: Normal range of motion.     Comments: Extremities shows minimal swelling of the left leg.  Brian Benton has good range of motion of the leg.  Brian Benton has good pulses in his distal extremities.  The right leg is unremarkable.  Lymphadenopathy:     Cervical: No cervical adenopathy.  Skin:    General: Skin is warm and dry.     Findings: No erythema or rash.  Neurological:     Mental Status: Brian Benton is alert and oriented to person, place, and time.  Psychiatric:        Behavior: Behavior normal.        Thought Content: Thought content normal.        Judgment: Judgment normal.   Lab Results  Component Value Date   WBC 10.4 01/13/2021   HGB 9.0 (L) 01/13/2021   HCT 27.6 (L) 01/13/2021   MCV 93.6 01/13/2021   PLT 208 01/13/2021     Chemistry      Component Value Date/Time   NA 141 01/13/2021 1205   K 3.8 01/13/2021 1205   CL 106 01/13/2021 1205   CO2 29 01/13/2021 1205   BUN 18 01/13/2021 1205   CREATININE 1.00 01/13/2021 1205      Component Value Date/Time   CALCIUM 10.1 01/13/2021 1205   ALKPHOS 68 01/13/2021 1205   AST 13 (L) 01/13/2021 1205   ALT 8 01/13/2021 1205   BILITOT 0.4 01/13/2021 1205      Impression and Plan: Brian Benton is a really nice 65 year old white male.  Brian Benton has a very interesting problem.  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  Now, Brian Benton has a recurrence that I would consider metastatic.  This is outside of the bladder.  It is not operable as it is appear to be invading to the psoas muscle and also involving his lower spine.  We will now try Padcev.  Hopefully, this will help.  Hopefully, the steroids will also help with inflammation that might be taken down some of the pain that Brian Benton has.  May be, adjusting his pain medications will make a difference.  I know this is very tough.  We really do not have a lot of options as we have already been through quite a few treatments for him.  Brian Benton will get 3 weeks on and 1 week off.  I will do 2 full  cycles of treatment and then we will repeat his scans.  However, I really think that we will tell if Brian Benton is responding by whether or not his pain is improving.  I just hate this for Brian Benton.  Brian Benton is really nice.  Brian Benton is done all that we have asked him to do so far.  Brian Benton is at a decent quality of life up until now.  Hopefully, if we get the pain better, his quality of life will also improve.   Volanda Napoleon, MD 8/3/20222:20 PM

## 2021-01-13 NOTE — Patient Instructions (Signed)
[facility] [place]  Discharge Instructions: Thank you for choosing [facility] to provide your oncology and hematology care.   If you have a lab appointment with the [facility], please go directly to the [facility] and check in at the registration area.  Wear comfortable clothing and clothing appropriate for easy access to any Portacath or PICC line.   We strive to give you quality time with your provider. You may need to reschedule your appointment if you arrive late (15 or more minutes).  Arriving late affects you and other patients whose appointments are after yours.  Also, if you miss three or more appointments without notifying the office, you may be dismissed from the clinic at the provider's discretion.      For prescription refill requests, have your pharmacy contact our office and allow 72 hours for refills to be completed.    Today you received the following chemotherapy and/or immunotherapy agents Padcev    To help prevent nausea and vomiting after your treatment, we encourage you to take your nausea medication as directed.  BELOW ARE SYMPTOMS THAT SHOULD BE REPORTED IMMEDIATELY: *FEVER GREATER THAN 100.4 F (38 C) OR HIGHER *CHILLS OR SWEATING *NAUSEA AND VOMITING THAT IS NOT CONTROLLED WITH YOUR NAUSEA MEDICATION *UNUSUAL SHORTNESS OF BREATH *UNUSUAL BRUISING OR BLEEDING *URINARY PROBLEMS (pain or burning when urinating, or frequent urination) *BOWEL PROBLEMS (unusual diarrhea, constipation, pain near the anus) TENDERNESS IN MOUTH AND THROAT WITH OR WITHOUT PRESENCE OF ULCERS (sore throat, sores in mouth, or a toothache) UNUSUAL RASH, SWELLING OR PAIN  UNUSUAL VAGINAL DISCHARGE OR ITCHING   Items with * indicate a potential emergency and should be followed up as soon as possible or go to the Emergency Department if any problems should occur.  Please show the CHEMOTHERAPY ALERT CARD or IMMUNOTHERAPY ALERT CARD at check-in to the  Emergency Department and triage nurse. Should you have questions after your visit or need to cancel or reschedule your appointment, please contact [facility]  [phone] and follow the prompts.  Office hours are 8:00 a.m. to 4:30 p.m. Monday - Friday. Please note that voicemails left after 4:00 p.m. may not be returned until the following business day.  We are closed weekends and major holidays. You have access to a nurse at all times for urgent questions. Please call the main number to the clinic [phone] and follow the prompts.  For any non-urgent questions, you may also contact your provider using MyChart. We now offer e-Visits for anyone 64 and older to request care online for non-urgent symptoms. For details visit [email or web address].   Also download the MyChart app! Go to the app store, search "MyChart", open the app, select [facility], and log in with your MyChart username and password.  Due to Covid, a mask is required upon entering the hospital/clinic. If you do not have a mask, one will be given to you upon arrival. For doctor visits, patients may have 1 support person aged 62 or older with them. For treatment visits, patients cannot have anyone with them due to current Covid guidelines and our immunocompromised population.   Enfortumab vedotin injection What is this medication? ENFORTUMAB VEDOTIN (en FORT ue mab ve DOE tin) is a chemotherapy medicine andmonoclonal antibody. It treats urothelial cancer. This medicine may be used for other purposes; ask your health care provider orpharmacist if you have questions. COMMON BRAND NAME(S): PADCEV What should I tell my care team  before I take this medication? They need to know if you have any of these conditions: diabetes (high blood sugar) eye disease, vision problems lung disease skin conditions or sensitivity tingling of the fingers or toes, or other nerve disorder an unusual or allergic reaction to  enfortumab vedotin, other medicines, foods, dyes or preservatives pregnant or trying to get pregnant breast-feeding How should I use this medication? This medicine is injected into a vein. It is given by a health care provider ina hospital or clinic setting. Talk to your health care provider about the use of this medicine in children.Special care may be needed. Overdosage: If you think you have taken too much of this medicine contact apoison control center or emergency room at once. NOTE: This medicine is only for you. Do not share this medicine with others. What if I miss a dose? Keep appointments for follow-up doses. It is important not to miss your dose. Call your doctor or health care provider if you are unable to keep anappointment. What may interact with this medication? This medicine may also interact with the following medications: certain antivirals for HIV certain medicines for fungal infections like ketoconazole, itraconazole, or posaconazole clarithromycin grapefruit juice mifepristone telithromycin This list may not describe all possible interactions. Give your health care provider a list of all the medicines, herbs, non-prescription drugs, or dietary supplements you use. Also tell them if you smoke, drink alcohol, or use illegaldrugs. Some items may interact with your medicine. What should I watch for while using this medication? This medicine may make you feel generally unwell. This is not uncommon as chemotherapy can affect healthy cells as well as cancer cells. Report any side effects. Continue your course of treatment even though you feel ill unless yourhealth care provider tells you to stop. Your condition will be monitored carefully while you are receiving thismedicine. Do not become pregnant while taking this medicine or for 2 months after stopping it. Women should inform their health care provider if they wish to become pregnant or think they might be pregnant. Men should  not father a child while taking this medicine and for 4 months after stopping it. There is potential for serious side effects to an unborn child. Talk to your health careprovider for more information. Do not breast-feed an infant while taking this medicine or for at least 3 weeksafter stopping it. This medicine may make it more difficult to father a child. Talk to your healthcare provider if you are concerned about your fertility. This medicine may increase blood sugar. Ask your health care provider ifchanges in diet or medicines are needed if you have diabetes. This medicine can cause a serious condition in which there is too much acid in your blood. If you develop nausea, vomiting, stomach pain, unusual tiredness, or breathing problems, stop taking this medicine and call your health care provider right away. If possible, use a ketone dipstick to check for ketones inyour urine. This medicine may cause dry eyes and blurred vision. If you wear contact lenses, you may feel some discomfort. Lubricating eye drops may help. See yourhealth care provider if the problem does not go away or is severe. Tell your health care provider right away if you have any change in youreyesight. This medicine may increase your risk of getting an infection. Call your health care provider for advice if you get a fever, chills, or sore throat, or other symptoms of a cold or flu. Do not treat yourself. Try to avoid being aroundpeople who  are sick. This medicine may cause serious skin reactions. They can happen weeks to months after starting the medicine. Contact your health care provider right away if you notice fevers or flu-like symptoms with a rash. The rash may be red or purple and then turn into blisters or peeling of the skin. Or, you might notice a red rash with swelling of the face, lips or lymph nodes in your neck or underyour arms. What side effects may I notice from receiving this medication? Side effects that you should  report to your doctor or health care professionalas soon as possible: allergic reactions (skin rash, itching or hives; swelling of the face, lips, or tongue) blurred vision changes in vision cough dry eyes high blood sugar (increased hunger, thirst or urination; unusually weak or tired, blurry vision) infection (fever, chills, cough, sore throat, pain or trouble passing urine) pain, tingling, numbness in the hands or feet redness, blistering, peeling, bleeding, swelling, or loosening of the skin on the palms of your hands or soles of your feet or inside the mouth trouble breathing Side effects that usually do not require medical attention (report these toyour doctor or health care professional if they continue or are bothersome): changes in taste diarrhea dry skin hair loss loss of appetite nausea, vomiting weak or tired This list may not describe all possible side effects. Call your doctor for medical advice about side effects. You may report side effects to FDA at1-800-FDA-1088. Where should I keep my medication? This medicine is given in a hospital or clinic and will not be stored at home. NOTE: This sheet is a summary. It may not cover all possible information. If you have questions about this medicine, talk to your doctor, pharmacist, orhealth care provider.  2022 Elsevier/Gold Standard (2019-12-24 11:38:36)

## 2021-01-14 ENCOUNTER — Encounter: Payer: Self-pay | Admitting: *Deleted

## 2021-01-14 ENCOUNTER — Other Ambulatory Visit (HOSPITAL_BASED_OUTPATIENT_CLINIC_OR_DEPARTMENT_OTHER): Payer: Self-pay

## 2021-01-14 ENCOUNTER — Inpatient Hospital Stay: Payer: 59

## 2021-01-14 DIAGNOSIS — D649 Anemia, unspecified: Secondary | ICD-10-CM

## 2021-01-14 DIAGNOSIS — C679 Malignant neoplasm of bladder, unspecified: Secondary | ICD-10-CM

## 2021-01-14 DIAGNOSIS — Z5112 Encounter for antineoplastic immunotherapy: Secondary | ICD-10-CM | POA: Diagnosis not present

## 2021-01-14 LAB — IRON AND TIBC
Iron: 20 ug/dL — ABNORMAL LOW (ref 42–163)
Saturation Ratios: 9 % — ABNORMAL LOW (ref 20–55)
TIBC: 216 ug/dL (ref 202–409)
UIBC: 196 ug/dL (ref 117–376)

## 2021-01-14 LAB — FERRITIN: Ferritin: 243 ng/mL (ref 24–336)

## 2021-01-14 MED ORDER — SODIUM CHLORIDE 0.9% FLUSH
10.0000 mL | INTRAVENOUS | Status: DC | PRN
  Administered 2021-01-14: 10 mL via INTRAVENOUS
  Filled 2021-01-14: qty 10

## 2021-01-14 MED ORDER — ACETAMINOPHEN 325 MG PO TABS
ORAL_TABLET | ORAL | Status: AC
  Filled 2021-01-14: qty 2

## 2021-01-14 MED ORDER — HEPARIN SOD (PORK) LOCK FLUSH 100 UNIT/ML IV SOLN
500.0000 [IU] | Freq: Once | INTRAVENOUS | Status: AC
  Administered 2021-01-14: 500 [IU] via INTRAVENOUS
  Filled 2021-01-14: qty 5

## 2021-01-14 MED ORDER — ACETAMINOPHEN 325 MG PO TABS
650.0000 mg | ORAL_TABLET | Freq: Once | ORAL | Status: AC
  Administered 2021-01-14: 650 mg via ORAL

## 2021-01-14 MED ORDER — SODIUM CHLORIDE 0.9% IV SOLUTION
250.0000 mL | Freq: Once | INTRAVENOUS | Status: AC
  Administered 2021-01-14: 250 mL via INTRAVENOUS
  Filled 2021-01-14: qty 250

## 2021-01-14 MED ORDER — DIPHENHYDRAMINE HCL 25 MG PO CAPS
25.0000 mg | ORAL_CAPSULE | Freq: Once | ORAL | Status: AC
  Administered 2021-01-14: 25 mg via ORAL

## 2021-01-14 MED ORDER — DIPHENHYDRAMINE HCL 25 MG PO CAPS
ORAL_CAPSULE | ORAL | Status: AC
  Filled 2021-01-14: qty 2

## 2021-01-14 NOTE — Progress Notes (Signed)
Patient was seen yesterday after progression noted on scans. Will change treatment.   Oncology Nurse Navigator Documentation  Oncology Nurse Navigator Flowsheets 01/14/2021  Abnormal Finding Date -  Confirmed Diagnosis Date -  Diagnosis Status -  Planned Course of Treatment -  Phase of Treatment -  Chemotherapy Pending- Reason: -  Chemo/Radiation Concurrent Actual Start Date: -  Navigator Follow Up Date: 02/10/2021  Navigator Follow Up Reason: Follow-up Appointment;Chemotherapy  Navigator Location [facility]  Referral Date to RadOnc/MedOnc -  Navigator Encounter Type Appt/Treatment Plan Review  Telephone -  Treatment Initiated Date -  Patient Visit Type MedOnc  Treatment Phase Active Tx  Barriers/Navigation Needs Coordination of Care;Education  Education -  Interventions None Required  Acuity -  Referrals -  Coordination of Care -  Education Method -  Support Groups/Services Friends and Family  Time Spent with Patient 15

## 2021-01-15 ENCOUNTER — Encounter: Payer: Self-pay | Admitting: Family

## 2021-01-15 ENCOUNTER — Encounter: Payer: Self-pay | Admitting: Hematology & Oncology

## 2021-01-15 ENCOUNTER — Other Ambulatory Visit (HOSPITAL_BASED_OUTPATIENT_CLINIC_OR_DEPARTMENT_OTHER): Payer: Self-pay

## 2021-01-15 ENCOUNTER — Telehealth: Payer: Self-pay | Admitting: *Deleted

## 2021-01-15 LAB — BPAM RBC
Blood Product Expiration Date: 202208292359
ISSUE DATE / TIME: 202208040754
Unit Type and Rh: 6200

## 2021-01-15 LAB — TYPE AND SCREEN
ABO/RH(D): A POS
Antibody Screen: NEGATIVE
Unit division: 0

## 2021-01-15 NOTE — Telephone Encounter (Addendum)
-----   Message from Volanda Napoleon, MD sent at 01/14/2021  3:07 PM EDT ----- Please make sure that he gets iron when he comes in for his blood transfusion. Iron not given on this day.  Please give on 01/20/21.

## 2021-01-18 ENCOUNTER — Other Ambulatory Visit (HOSPITAL_BASED_OUTPATIENT_CLINIC_OR_DEPARTMENT_OTHER): Payer: Self-pay

## 2021-01-19 ENCOUNTER — Other Ambulatory Visit: Payer: Self-pay

## 2021-01-19 ENCOUNTER — Telehealth: Payer: Self-pay

## 2021-01-19 ENCOUNTER — Other Ambulatory Visit: Payer: Self-pay | Admitting: Hematology & Oncology

## 2021-01-19 DIAGNOSIS — C799 Secondary malignant neoplasm of unspecified site: Secondary | ICD-10-CM

## 2021-01-19 MED ORDER — TRAMADOL HCL 50 MG PO TABS: 100.0000 mg | ORAL_TABLET | Freq: Four times a day (QID) | ORAL | 0 refills | Status: DC | PRN

## 2021-01-19 NOTE — Telephone Encounter (Signed)
Patients daughter called stating patient is still having pain, fentanyl works but has intermittent pain and does not take his dilaudid during the day so he can work. Is requesting to switch back to the oxycodone as he had more relief from it or try something to take during the day. Discussed with Dr.Ennever, rx for ultram sent to pharmacy and daughter informed. Patients daughter and patient also requested to move appt for tomorrow to Thursday or Friday. Scheduling message sent.

## 2021-01-20 ENCOUNTER — Inpatient Hospital Stay: Payer: 59

## 2021-01-21 ENCOUNTER — Other Ambulatory Visit: Payer: Self-pay

## 2021-01-21 MED ORDER — OXYCODONE HCL ER 10 MG PO T12A: 10.0000 mg | EXTENDED_RELEASE_TABLET | ORAL | 0 refills | Status: DC | PRN

## 2021-01-21 NOTE — Telephone Encounter (Signed)
R/c to pt regarding medication concerns. Pt stated that the Morphine prescription was too much for him and making him feel "high." Pt would like to switch back to the Oxycodone 10 mg. Please advise for refill. Thank you!

## 2021-01-22 ENCOUNTER — Other Ambulatory Visit: Payer: Self-pay | Admitting: *Deleted

## 2021-01-22 ENCOUNTER — Inpatient Hospital Stay: Payer: 59

## 2021-01-22 ENCOUNTER — Other Ambulatory Visit: Payer: Self-pay

## 2021-01-22 VITALS — BP 132/72 | HR 71 | Temp 98.7°F | Resp 17

## 2021-01-22 DIAGNOSIS — C799 Secondary malignant neoplasm of unspecified site: Secondary | ICD-10-CM

## 2021-01-22 DIAGNOSIS — D509 Iron deficiency anemia, unspecified: Secondary | ICD-10-CM

## 2021-01-22 DIAGNOSIS — C679 Malignant neoplasm of bladder, unspecified: Secondary | ICD-10-CM

## 2021-01-22 DIAGNOSIS — Z5112 Encounter for antineoplastic immunotherapy: Secondary | ICD-10-CM | POA: Diagnosis not present

## 2021-01-22 LAB — CBC WITH DIFFERENTIAL (CANCER CENTER ONLY)
Abs Immature Granulocytes: 0.13 10*3/uL — ABNORMAL HIGH (ref 0.00–0.07)
Basophils Absolute: 0 10*3/uL (ref 0.0–0.1)
Basophils Relative: 0 %
Eosinophils Absolute: 0.1 10*3/uL (ref 0.0–0.5)
Eosinophils Relative: 0 %
HCT: 30.2 % — ABNORMAL LOW (ref 39.0–52.0)
Hemoglobin: 10 g/dL — ABNORMAL LOW (ref 13.0–17.0)
Immature Granulocytes: 1 %
Lymphocytes Relative: 2 %
Lymphs Abs: 0.3 10*3/uL — ABNORMAL LOW (ref 0.7–4.0)
MCH: 30.3 pg (ref 26.0–34.0)
MCHC: 33.1 g/dL (ref 30.0–36.0)
MCV: 91.5 fL (ref 80.0–100.0)
Monocytes Absolute: 0.3 10*3/uL (ref 0.1–1.0)
Monocytes Relative: 2 %
Neutro Abs: 17.4 10*3/uL — ABNORMAL HIGH (ref 1.7–7.7)
Neutrophils Relative %: 95 %
Platelet Count: 165 10*3/uL (ref 150–400)
RBC: 3.3 MIL/uL — ABNORMAL LOW (ref 4.22–5.81)
RDW: 16 % — ABNORMAL HIGH (ref 11.5–15.5)
WBC Count: 18.2 10*3/uL — ABNORMAL HIGH (ref 4.0–10.5)
nRBC: 0 % (ref 0.0–0.2)

## 2021-01-22 LAB — CMP (CANCER CENTER ONLY)
ALT: 16 U/L (ref 0–44)
AST: 10 U/L — ABNORMAL LOW (ref 15–41)
Albumin: 3.7 g/dL (ref 3.5–5.0)
Alkaline Phosphatase: 62 U/L (ref 38–126)
Anion gap: 8 (ref 5–15)
BUN: 33 mg/dL — ABNORMAL HIGH (ref 8–23)
CO2: 27 mmol/L (ref 22–32)
Calcium: 9.9 mg/dL (ref 8.9–10.3)
Chloride: 100 mmol/L (ref 98–111)
Creatinine: 1.03 mg/dL (ref 0.61–1.24)
GFR, Estimated: >60 mL/min (ref >60)
Glucose, Bld: 149 mg/dL — ABNORMAL HIGH (ref 70–99)
Potassium: 4.6 mmol/L (ref 3.5–5.1)
Sodium: 135 mmol/L (ref 135–145)
Total Bilirubin: 0.4 mg/dL (ref 0.3–1.2)
Total Protein: 6.3 g/dL — ABNORMAL LOW (ref 6.5–8.1)

## 2021-01-22 MED ORDER — HEPARIN SOD (PORK) LOCK FLUSH 100 UNIT/ML IV SOLN
500.0000 [IU] | Freq: Once | INTRAVENOUS | Status: AC | PRN
  Administered 2021-01-22: 500 [IU]
  Filled 2021-01-22: qty 5

## 2021-01-22 MED ORDER — SODIUM CHLORIDE 0.9 % IV SOLN
Freq: Once | INTRAVENOUS | Status: AC
  Filled 2021-01-22: qty 250

## 2021-01-22 MED ORDER — SODIUM CHLORIDE 0.9 % IV SOLN
200.0000 mg | Freq: Once | INTRAVENOUS | Status: AC
  Administered 2021-01-22: 200 mg via INTRAVENOUS
  Filled 2021-01-22: qty 200

## 2021-01-22 MED ORDER — SODIUM CHLORIDE 0.9 % IV SOLN
10.0000 mg | Freq: Once | INTRAVENOUS | Status: AC
  Administered 2021-01-22: 10 mg via INTRAVENOUS
  Filled 2021-01-22: qty 10

## 2021-01-22 MED ORDER — PALONOSETRON HCL INJECTION 0.25 MG/5ML
0.2500 mg | Freq: Once | INTRAVENOUS | Status: AC
  Administered 2021-01-22: 0.25 mg via INTRAVENOUS
  Filled 2021-01-22: qty 5

## 2021-01-22 MED ORDER — SODIUM CHLORIDE 0.9% FLUSH
10.0000 mL | INTRAVENOUS | Status: DC | PRN
  Administered 2021-01-22: 10 mL
  Filled 2021-01-22: qty 10

## 2021-01-22 MED ORDER — SODIUM CHLORIDE 0.9 % IV SOLN
Freq: Once | INTRAVENOUS | Status: DC
  Filled 2021-01-22: qty 250

## 2021-01-22 MED ORDER — SODIUM CHLORIDE 0.9 % IV SOLN
1.2300 mg/kg | Freq: Once | INTRAVENOUS | Status: AC
  Administered 2021-01-22: 90 mg via INTRAVENOUS
  Filled 2021-01-22: qty 9

## 2021-01-22 NOTE — Patient Instructions (Signed)
[facility] [place]  Discharge Instructions: Thank you for choosing [facility] to provide your oncology and hematology care.   If you have a lab appointment with the [facility], please go directly to the [facility] and check in at the registration area.  Wear comfortable clothing and clothing appropriate for easy access to any Portacath or PICC line.   We strive to give you quality time with your provider. You may need to reschedule your appointment if you arrive late (15 or more minutes).  Arriving late affects you and other patients whose appointments are after yours.  Also, if you miss three or more appointments without notifying the office, you may be dismissed from the clinic at the provider's discretion.      For prescription refill requests, have your pharmacy contact our office and allow 72 hours for refills to be completed.    Today you received the following chemotherapy and/or immunotherapy agents Padcev and Venofer.   To help prevent nausea and vomiting after your treatment, we encourage you to take your nausea medication as directed.  BELOW ARE SYMPTOMS THAT SHOULD BE REPORTED IMMEDIATELY: *FEVER GREATER THAN 100.4 F (38 C) OR HIGHER *CHILLS OR SWEATING *NAUSEA AND VOMITING THAT IS NOT CONTROLLED WITH YOUR NAUSEA MEDICATION *UNUSUAL SHORTNESS OF BREATH *UNUSUAL BRUISING OR BLEEDING *URINARY PROBLEMS (pain or burning when urinating, or frequent urination) *BOWEL PROBLEMS (unusual diarrhea, constipation, pain near the anus) TENDERNESS IN MOUTH AND THROAT WITH OR WITHOUT PRESENCE OF ULCERS (sore throat, sores in mouth, or a toothache) UNUSUAL RASH, SWELLING OR PAIN  UNUSUAL VAGINAL DISCHARGE OR ITCHING   Items with * indicate a potential emergency and should be followed up as soon as possible or go to the Emergency Department if any problems should occur.  Please show the CHEMOTHERAPY ALERT CARD or IMMUNOTHERAPY ALERT CARD at check-in  to the Emergency Department and triage nurse. Should you have questions after your visit or need to cancel or reschedule your appointment, please contact [facility]  [phone] and follow the prompts.  Office hours are 8:00 a.m. to 4:30 p.m. Monday - Friday. Please note that voicemails left after 4:00 p.m. may not be returned until the following business day.  We are closed weekends and major holidays. You have access to a nurse at all times for urgent questions. Please call the main number to the clinic [phone] and follow the prompts.  For any non-urgent questions, you may also contact your provider using MyChart. We now offer e-Visits for anyone 66 and older to request care online for non-urgent symptoms. For details visit [email or web address].   Also download the MyChart app! Go to the app store, search "MyChart", open the app, select [facility], and log in with your MyChart username and password.  Due to Covid, a mask is required upon entering the hospital/clinic. If you do not have a mask, one will be given to you upon arrival. For doctor visits, patients may have 1 support person aged 66 or older with them. For treatment visits, patients cannot have anyone with them due to current Covid guidelines and our immunocompromised population.

## 2021-01-26 ENCOUNTER — Other Ambulatory Visit: Payer: Self-pay

## 2021-01-26 ENCOUNTER — Other Ambulatory Visit (HOSPITAL_BASED_OUTPATIENT_CLINIC_OR_DEPARTMENT_OTHER): Payer: Self-pay

## 2021-01-26 DIAGNOSIS — C799 Secondary malignant neoplasm of unspecified site: Secondary | ICD-10-CM

## 2021-01-26 DIAGNOSIS — C679 Malignant neoplasm of bladder, unspecified: Secondary | ICD-10-CM

## 2021-01-27 ENCOUNTER — Other Ambulatory Visit (HOSPITAL_BASED_OUTPATIENT_CLINIC_OR_DEPARTMENT_OTHER): Payer: Self-pay

## 2021-01-27 ENCOUNTER — Inpatient Hospital Stay: Payer: 59

## 2021-01-27 ENCOUNTER — Other Ambulatory Visit: Payer: Self-pay

## 2021-01-27 DIAGNOSIS — C799 Secondary malignant neoplasm of unspecified site: Secondary | ICD-10-CM

## 2021-01-27 DIAGNOSIS — C679 Malignant neoplasm of bladder, unspecified: Secondary | ICD-10-CM

## 2021-01-27 DIAGNOSIS — Z5112 Encounter for antineoplastic immunotherapy: Secondary | ICD-10-CM | POA: Diagnosis not present

## 2021-01-27 LAB — CMP (CANCER CENTER ONLY)
ALT: 13 U/L (ref 0–44)
AST: 10 U/L — ABNORMAL LOW (ref 15–41)
Albumin: 3.4 g/dL — ABNORMAL LOW (ref 3.5–5.0)
Alkaline Phosphatase: 52 U/L (ref 38–126)
Anion gap: 9 (ref 5–15)
BUN: 34 mg/dL — ABNORMAL HIGH (ref 8–23)
CO2: 26 mmol/L (ref 22–32)
Calcium: 9.2 mg/dL (ref 8.9–10.3)
Chloride: 100 mmol/L (ref 98–111)
Creatinine: 1.15 mg/dL (ref 0.61–1.24)
GFR, Estimated: >60 mL/min (ref >60)
Glucose, Bld: 181 mg/dL — ABNORMAL HIGH (ref 70–99)
Potassium: 4.7 mmol/L (ref 3.5–5.1)
Sodium: 135 mmol/L (ref 135–145)
Total Bilirubin: 0.4 mg/dL (ref 0.3–1.2)
Total Protein: 6.3 g/dL — ABNORMAL LOW (ref 6.5–8.1)

## 2021-01-27 LAB — CBC WITH DIFFERENTIAL (CANCER CENTER ONLY)
Abs Immature Granulocytes: 0.27 10*3/uL — ABNORMAL HIGH (ref 0.00–0.07)
Basophils Absolute: 0 10*3/uL (ref 0.0–0.1)
Basophils Relative: 0 %
Eosinophils Absolute: 0 10*3/uL (ref 0.0–0.5)
Eosinophils Relative: 0 %
HCT: 28.5 % — ABNORMAL LOW (ref 39.0–52.0)
Hemoglobin: 9.7 g/dL — ABNORMAL LOW (ref 13.0–17.0)
Immature Granulocytes: 1 %
Lymphocytes Relative: 1 %
Lymphs Abs: 0.2 10*3/uL — ABNORMAL LOW (ref 0.7–4.0)
MCH: 31.1 pg (ref 26.0–34.0)
MCHC: 34 g/dL (ref 30.0–36.0)
MCV: 91.3 fL (ref 80.0–100.0)
Monocytes Absolute: 0.3 10*3/uL (ref 0.1–1.0)
Monocytes Relative: 1 %
Neutro Abs: 27.1 10*3/uL — ABNORMAL HIGH (ref 1.7–7.7)
Neutrophils Relative %: 97 %
Platelet Count: 199 10*3/uL (ref 150–400)
RBC: 3.12 MIL/uL — ABNORMAL LOW (ref 4.22–5.81)
RDW: 17.3 % — ABNORMAL HIGH (ref 11.5–15.5)
WBC Count: 27.9 10*3/uL — ABNORMAL HIGH (ref 4.0–10.5)
nRBC: 0 % (ref 0.0–0.2)

## 2021-01-27 MED ORDER — PALONOSETRON HCL INJECTION 0.25 MG/5ML
0.2500 mg | Freq: Once | INTRAVENOUS | Status: AC
Start: 2021-01-27 — End: 2021-01-27
  Administered 2021-01-27: 0.25 mg via INTRAVENOUS
  Filled 2021-01-27: qty 5

## 2021-01-27 MED ORDER — SODIUM CHLORIDE 0.9 % IV SOLN
Freq: Once | INTRAVENOUS | Status: AC
Start: 2021-01-27 — End: 2021-01-27

## 2021-01-27 MED ORDER — SODIUM CHLORIDE 0.9% FLUSH
10.0000 mL | INTRAVENOUS | Status: DC | PRN
  Administered 2021-01-27: 10 mL

## 2021-01-27 MED ORDER — SODIUM CHLORIDE 0.9 % IV SOLN
10.0000 mg | Freq: Once | INTRAVENOUS | Status: AC
  Administered 2021-01-27: 10 mg via INTRAVENOUS
  Filled 2021-01-27: qty 10

## 2021-01-27 MED ORDER — HEPARIN SOD (PORK) LOCK FLUSH 100 UNIT/ML IV SOLN
500.0000 [IU] | Freq: Once | INTRAVENOUS | Status: AC | PRN
  Administered 2021-01-27: 500 [IU]

## 2021-01-27 MED ORDER — SODIUM CHLORIDE 0.9 % IV SOLN
1.2300 mg/kg | Freq: Once | INTRAVENOUS | Status: AC
  Administered 2021-01-27: 90 mg via INTRAVENOUS
  Filled 2021-01-27: qty 9

## 2021-01-27 NOTE — Patient Instructions (Signed)
[facility] [place]  Discharge Instructions: Thank you for choosing [facility] to provide your oncology and hematology care.   If you have a lab appointment with the [facility], please go directly to the [facility] and check in at the registration area.  Wear comfortable clothing and clothing appropriate for easy access to any Portacath or PICC line.   We strive to give you quality time with your provider. You may need to reschedule your appointment if you arrive late (15 or more minutes).  Arriving late affects you and other patients whose appointments are after yours.  Also, if you miss three or more appointments without notifying the office, you may be dismissed from the clinic at the provider's discretion.      For prescription refill requests, have your pharmacy contact our office and allow 72 hours for refills to be completed.    Today you received the following chemotherapy and/or immunotherapy agents enfortumab vedotin-ejfv     To help prevent nausea and vomiting after your treatment, we encourage you to take your nausea medication as directed.  BELOW ARE SYMPTOMS THAT SHOULD BE REPORTED IMMEDIATELY: *FEVER GREATER THAN 100.4 F (38 C) OR HIGHER *CHILLS OR SWEATING *NAUSEA AND VOMITING THAT IS NOT CONTROLLED WITH YOUR NAUSEA MEDICATION *UNUSUAL SHORTNESS OF BREATH *UNUSUAL BRUISING OR BLEEDING *URINARY PROBLEMS (pain or burning when urinating, or frequent urination) *BOWEL PROBLEMS (unusual diarrhea, constipation, pain near the anus) TENDERNESS IN MOUTH AND THROAT WITH OR WITHOUT PRESENCE OF ULCERS (sore throat, sores in mouth, or a toothache) UNUSUAL RASH, SWELLING OR PAIN  UNUSUAL VAGINAL DISCHARGE OR ITCHING   Items with * indicate a potential emergency and should be followed up as soon as possible or go to the Emergency Department if any problems should occur.  Please show the CHEMOTHERAPY ALERT CARD or IMMUNOTHERAPY ALERT CARD at  check-in to the Emergency Department and triage nurse. Should you have questions after your visit or need to cancel or reschedule your appointment, please contact [facility]  [phone] and follow the prompts.  Office hours are 8:00 a.m. to 4:30 p.m. Monday - Friday. Please note that voicemails left after 4:00 p.m. may not be returned until the following business day.  We are closed weekends and major holidays. You have access to a nurse at all times for urgent questions. Please call the main number to the clinic [phone] and follow the prompts.  For any non-urgent questions, you may also contact your provider using MyChart. We now offer e-Visits for anyone 9 and older to request care online for non-urgent symptoms. For details visit [email or web address].   Also download the MyChart app! Go to the app store, search "MyChart", open the app, select [facility], and log in with your MyChart username and password.  Due to Covid, a mask is required upon entering the hospital/clinic. If you do not have a mask, one will be given to you upon arrival. For doctor visits, patients may have 1 support person aged 67 or older with them. For treatment visits, patients cannot have anyone with them due to current Covid guidelines and our immunocompromised population.

## 2021-01-27 NOTE — Patient Instructions (Signed)

## 2021-02-02 ENCOUNTER — Ambulatory Visit: Payer: 59 | Admitting: Physical Therapy

## 2021-02-02 ENCOUNTER — Other Ambulatory Visit: Payer: Self-pay | Admitting: Hematology & Oncology

## 2021-02-02 ENCOUNTER — Other Ambulatory Visit (HOSPITAL_BASED_OUTPATIENT_CLINIC_OR_DEPARTMENT_OTHER): Payer: Self-pay

## 2021-02-02 MED ORDER — APIXABAN 5 MG PO TABS
ORAL_TABLET | Freq: Two times a day (BID) | ORAL | 5 refills | Status: DC
  Filled 2021-02-02: qty 60, 30d supply, fill #0

## 2021-02-02 MED ORDER — TEMAZEPAM 15 MG PO CAPS
15.0000 mg | ORAL_CAPSULE | Freq: Every evening | ORAL | 0 refills | Status: DC | PRN
  Filled 2021-02-02 – 2021-02-10 (×2): qty 30, 30d supply, fill #0

## 2021-02-03 ENCOUNTER — Other Ambulatory Visit: Payer: Self-pay

## 2021-02-03 ENCOUNTER — Ambulatory Visit: Payer: 59 | Attending: Hematology & Oncology | Admitting: Physical Therapy

## 2021-02-03 DIAGNOSIS — R29898 Other symptoms and signs involving the musculoskeletal system: Secondary | ICD-10-CM | POA: Diagnosis present

## 2021-02-03 DIAGNOSIS — M25662 Stiffness of left knee, not elsewhere classified: Secondary | ICD-10-CM | POA: Diagnosis present

## 2021-02-03 DIAGNOSIS — R262 Difficulty in walking, not elsewhere classified: Secondary | ICD-10-CM | POA: Insufficient documentation

## 2021-02-03 DIAGNOSIS — I890 Lymphedema, not elsewhere classified: Secondary | ICD-10-CM | POA: Insufficient documentation

## 2021-02-03 NOTE — Therapy (Signed)
[place], [place], [address] Phone: [phone]   Fax:  [phone]  Physical Therapy Evaluation  Patient Details  Name: Brian Benton MRN: [id number] Date of Birth: 05-09-1956 Referring Provider (PT): Dr. Marin Olp   Encounter Date: 02/03/2021   PT End of Session - 02/03/21 1659     Visit Number 1    Number of Visits 26   pt will likely not use all visits   Date for PT Re-Evaluation 05/06/21    PT Start Time 1200    PT Stop Time 1300    PT Time Calculation (min) 60 min    Activity Tolerance Patient tolerated treatment well    Behavior During Therapy [facility] for tasks assessed/performed             Past Medical History:  Diagnosis Date   Cancer [facility])    bladder   History of bladder cancer    s/p  TURBT 07-25-2014   Hyperlipidemia    Mass of left hand    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 ([facility]) 03/06/2020    Past Surgical History:  Procedure Laterality Date   BLADDER SURGERY     [place]   COLONOSCOPY     CYSTECTOMY     CYSTOSCOPY WITH BIOPSY N/A 07/25/2014   Procedure: CYSTOSCOPY WITH BLADDER BIOPSY AND FULGERATION;  Surgeon: [name], MD;  Location: [facility];  Service: Urology;  Laterality: N/A;   CYSTOSCOPY WITH BIOPSY N/A 11/28/2014   Procedure: CYSTOSCOPY WITH  BLADDER BIOPSY;  Surgeon: [name], MD;  Location[facility];  Service: Urology;  Laterality: N/A;   HEMORRHOID SURGERY  03/15/2012   Procedure: HEMORRHOIDECTOMY;  Surgeon: [name], MD;  Location: [facility];  Service: General;  Laterality: N/A;   HERNIA REPAIR     INGUINAL HERNIA REPAIR Bilateral 2006   INTRAVASCULAR ULTRASOUND/IVUS N/A 03/13/2020   Procedure: Intravascular Ultrasound/IVUS;  Surgeon: Elam Dutch, MD;  Location: [facility];  Service: Cardiovascular;  Laterality: N/A;   IR IMAGING GUIDED PORT INSERTION  06/01/2020   IR INTRAVASCULAR ULTRASOUND NON  CORONARY  06/08/2020   IR INTRAVASCULAR ULTRASOUND NON CORONARY  06/17/2020   IR IVC FILTER PLMT / S&I /IMG GUID/MOD SED  06/08/2020   IR PTA VENOUS EXCEPT DIALYSIS CIRCUIT  06/08/2020   IR RADIOLOGIST EVAL & MGMT  04/30/2020   IR RADIOLOGIST EVAL & MGMT  05/27/2020   IR RADIOLOGIST EVAL & MGMT  07/21/2020   IR TRANSCATH PLC STENT  INITIAL VEIN  INC ANGIOPLASTY  06/17/2020   IR TRANSCATH PLC STENT 1ST ART NOT LE CV CAR VERT CAR  06/08/2020   IR US GUIDE VASC ACCESS LEFT  06/08/2020   IR US GUIDE VASC ACCESS LEFT  06/17/2020   IR VENO/EXT/UNI LEFT  06/08/2020   IR VENO/EXT/UNI LEFT  06/17/2020   IVC VENOGRAPHY N/A 03/13/2020   Procedure: IVC Venography;  Surgeon: Elam Dutch, MD;  Location: [facility] CV LAB;  Service: Cardiovascular;  Laterality: N/A;   LOWER EXTREMITY VENOGRAPHY Left 03/13/2020   Procedure: LOWER EXTREMITY VENOGRAPHY;  Surgeon: Elam Dutch, MD;  Location: [facility] CV [facility];  Service: Cardiovascular;  Laterality: Left;   MASS EXCISION Left 11/12/2018   Procedure: EXCISION MASS LEFT HAND;  Surgeon: Daryll Brod, MD;  Location: [facility];  Service: Orthopedics;  Laterality: Left;  FAB   PERIPHERAL VASCULAR INTERVENTION  03/13/2020   Procedure: PERIPHERAL VASCULAR INTERVENTION;  Surgeon: Elam Dutch, MD;  Location: [facility];  Service: Cardiovascular;;   PERIPHERAL VASCULAR THROMBECTOMY N/A 03/13/2020   Procedure: PERIPHERAL VASCULAR THROMBECTOMY;  Surgeon: Elam Dutch, MD;  Location: [facility] CV [facility];  Service: Cardiovascular;  Laterality: N/A;   RADIOLOGY WITH ANESTHESIA N/A 02/20/2020   Procedure: MRI LUMBAR W/O CONTRAST  WITH ANESTHESIA;  Surgeon: Radiologist, Medication, MD;  Location: [facility];  Service: Radiology;  Laterality: N/A;   RADIOLOGY WITH ANESTHESIA N/A 06/08/2020   Procedure: IR WITH ANESTHESIA VENOGRAM;  Surgeon: Radiologist, Medication, MD;  Location: [facility];  Service: Radiology;  Laterality: N/A;   TESTICLE SURGERY  2004    Ruptured Undescended Right testicle    TRANSURETHRAL RESECTION OF BLADDER TUMOR WITH GYRUS (TURBT-GYRUS) N/A 05/23/2014   Procedure: TRANSURETHRAL RESECTION OF BLADDER TUMOR WITH GYRUS (TURBT-GYRUS);  Surgeon: [name], MD;  Location: [facility];  Service: Urology;  Laterality: N/A;   WISDOM TOOTH EXTRACTION      There were no vitals filed for this visit.    Subjective Assessment - 02/03/21 1203     Subjective Pt says today he is feeling pretty good today.  He says he gots a sleep number bed and thinks that will help.He is back to using Flexitouch twice a day.  He couldn't use it for a while because of the pain.  He feels like he has his meds straightened out and he can manage the side effects now  He has not been doing much exercise because of the pain .  He has taken the past 2 weeks off as his wife had surgery and his sister in law is here. His daughter has been here also.Pt is interested in getting manual lymph drainage at this point and them go to his PT at [facility] as he would like to have aquatic therapy.    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 He started chemo and radiation and had treatment to help stent the blood clot and circulation is improved. He wears wears thigh high and below knee compression hose since November 2021.  Addendum 8/24:  He tried immunotherapy in the winter and spring of 2022, but had increaesd pain as the tumor grew and became more active. He had severly increased pain and more weakness He stopped that and is now on another medications that will have 3 weeks on and 2 weeks off cycle  He now uses ( at different times)  compression running pants to help with the fullness and  discomfort in is left upper thigh. compression shorts  circular knit compression stocking as well as Flexitouch. Depsite this, he still has swelling in his left thigh    Limitations Walking;House hold activities;Sitting   sitting tends to make swelling increase   How long can you sit comfortably? an hour or two    How long can you stand comfortably? 15 minutes    How long can you walk comfortably? with rolling walker very limited    Patient Stated Goals to get back to walking with the cane ( he now has to use the rolling walker now)    Currently in Pain? Yes    Pain Score 2    pt is  medicated   Pain Location Hip    Pain Orientation Posterior;Medial   wraps around the back of his hip to the middle   Pain Descriptors / Indicators Constant;Sharp    Pain Type Chronic pain    Pain Radiating Towards around hip to the mid thigh    Pain Onset More than a month ago    Pain Frequency Constant    Aggravating Factors  any "over activites    Pain Relieving Factors meds, pressure point on the back of the knee. Flexitouch, hydotherapy tank has helped in the past    Effect of Pain on Daily Activities limiting everything at this point                [facility] PT Assessment - 02/03/21 0001       Assessment   Medical Diagnosis bladder cancer    Referring Provider (PT) Dr. Marin Olp    Onset Date/Surgical Date 12/12/19   approximate time   [place] residence    Living Arrangements Spouse/significant other    Available Help at Discharge Available 24 hours/day      Prior Function   Level of Independence Independent    Vocation Full time employment    Dispensing optician, working from home    Leisure has a gym at home with [employer] and [employer], full barballs uses beachbody app with functional strength      Cognition   Overall Cognitive Status Within Functional Limits for tasks assessed      Observation/Other  Assessments   Observations pt walks into clinic with rollator , has a circular knit compression stocking and a sacroiliac belt that his daughter suggested. He has multiple dark red areas over his body form where he has fallen      Sensation   Light Touch Appears Intact      Coordination   Gross Motor Movements are Fluid and Coordinated No   weakness in left leg apparent     Functional Tests   Functional tests Sit to Stand      Sit to Stand   Comments 6   pt is pushing up with hands a mild amount and has most of weight on right leg that had visible tremor effort . RPE 5/10     Posture/Postural Control   Posture/Postural Control Postural limitations    Postural Limitations Rounded Shoulders;Forward head;Flexed trunk      ROM / Strength   AROM / PROM / Strength AROM;Strength      AROM   Overall AROM  Within functional limits for tasks performed    Overall AROM Comments "stiffness" in left leg , especially in ankle      Strength   Overall Strength Deficits    Overall Strength Comments weakness and atrophy in left leg functionally, pt is unable to bridge on left leg    Right Hand Grip (lbs) 50    Left Hand Grip (lbs) 70    Right/Left Hip Right;Left    Right Hip Flexion 5/5    Right Hip Extension 5/5    Right Hip ADduction --    Left Hip Flexion 2+/5    Left Hip Extension 2+/5    Left Hip ABduction 2+/5    Left Hip ADduction 1/5    Right/Left Knee Right;Left    Right Knee Flexion 5/5    Right Knee Extension 5/5    Left Knee Flexion 2+/5  Left Knee Extension 3+/5    Right/Left Ankle Left    Left Ankle Dorsiflexion 3+/5   limited by stiffness and edema   Left Ankle Plantar Flexion 4/5      Palpation   Palpation comment pitting edema in left foot and ankle      Ambulation/Gait   Gait Comments pt reports he is not able to walk very far or do much exercise while he had so much pain      Standardized Balance Assessment   Standardized Balance Assessment Timed Up and Go  Test    10 Meter Walk --      Timed Up and Go Test   TUG Normal TUG    Normal TUG (seconds) 19.57   pt used a rollator     High Level Balance   High Level Balance Comments pt cannot do tandem stance of single leg stance on left leg               LYMPHEDEMA/ONCOLOGY QUESTIONNAIRE - 02/03/21 0001       Type   Cancer Type 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  with DVT      Treatment   Active Chemotherapy Treatment Yes      What other symptoms do you have   Are you Having Heaviness or Tightness Yes    Are you having Pain Yes    Are you having pitting edema Yes    Body Site left foot and ankle    Is it Hard or Difficult finding clothes that fit No    Do you have infections No    Is there Decreased scar mobility No    Stemmer Sign Yes      Lymphedema Stage   Stage STAGE 2 SPONTANEOUSLY IRREVERSIBLE      Lymphedema Assessments   Lymphedema Assessments Lower extremities      Left Lower Extremity Lymphedema   20 cm Proximal to Suprapatella 48 cm    10 cm Proximal to Suprapatella 41.5 cm    At Midpatella/Popliteal Crease 38 cm    30 cm Proximal to Floor at Lateral Plantar Foot 30 cm    20 cm Proximal to Floor at Lateral Plantar Foot 23.3 cm    10 cm Proximal to Floor at Lateral Malleoli 25.1 cm    5 cm Proximal to 1st MTP Joint 26 cm    Around Proximal Great Toe 8.9 cm                     Objective measurements completed on examination: See above findings.       [facility] Adult PT Treatment/Exercise - 02/03/21 0001       Exercises   Exercises Other Exercises    Other Exercises  encouraged pt to do short arc quads over a bolser, ankle ROM and stretched      Manual Therapy   Manual Therapy Edema management;Manual Lymphatic Drainage (MLD)    Manual therapy comments remesured leg    Soft tissue mobilization in right sidelying, stationary circles to left axillary nodes, left axillo-inguinal anastamosis, left hip and thigh, lower leg, extra time on  foot with return along pathways.    Passive ROM stretching to left ankle                      PT Short Term Goals - 02/03/21 1712       PT SHORT TERM GOAL #1   Title Pt will report that  the pain in his left leg is never more than a 2    Time 4    Period Weeks    Status New      PT SHORT TERM GOAL #2   Title Pt will report that he is able to do a beginning exercise program at home for leg strength daily    Time 4    Period Weeks    Status New      PT SHORT TERM GOAL #3   Title Pt will report that he is able to manage his left leg swelling at home and is ready to progress to all exericse at [facility]  in his PT treatment    Time 4    Period Weeks    Status New               PT Long Term Goals - 02/03/21 1714       PT LONG TERM GOAL #1   Title Pt will report he is doing a walking and exercise program daily at home    Time 12    Period Weeks    Status New      PT LONG TERM GOAL #2   Title Pt will increase the number of sit to stands in 30 seconds to > 10 indicating an improvment in LE functional strength    Baseline 6 on 02/03/2021    Time 12    Period Weeks    Status New      PT LONG TERM GOAL #3   Title Pt will decrease his TUG score to < 12seconds showing an improvment in functional mobility and decrease risk of fall    Baseline 19.57 with rollator on 02/03/2021    Time 12    Period Weeks    Status New      PT LONG TERM GOAL #4   Title Pt will be able to walk 50 feet with a straight cane without balance loss    Time 12    Period Weeks    Status New                    Plan - 02/03/21 1700     Clinical Impression Statement Pt comes back to PT with progression of his metastatic lesion in his spine during a course of immunologic treatment.  He has severe debilitating pain during this time with decreased functional mobility and increased swelling in his leg.  He is now on a different regimen of 3 weeks on, 2 weeks of treatment  and his symptoms have stabalized.  He is able to return to PT for treatment of his swelling and exercise with the goal to return to walking with a straight cane. He has had a decrease in his objective tests since last he was here.  He would like to receive MLD to help decrease swelling and pain in his left hip and leg and begin exercise to left leg at our clinic.  Then, he would like to transfer to the [facility] clinic for continued exercise on the equipment there and hopefully in the pool.    Personal Factors and Comorbidities Comorbidity 3+    Comorbidities lumbar metastatic lesion, chronic DVT, ongoing chemotherapy    Examination-Activity Limitations Locomotion Level;Stairs;Stand;Squat;Lift;Bend;Transfers    Stability/Clinical Decision Making Evolving/Moderate complexity    Rehab Potential Excellent    PT Frequency 1x / week   may go up to 2 times a week possibly, may miss a few  weeks duing chemo cycling   PT Duration 12 weeks    PT Treatment/Interventions ADLs/Self Care Home Management;Electrical Stimulation;Moist Heat;Gait training;Stair training;Functional mobility training;Therapeutic activities;Neuromuscular re-education;Balance training;Therapeutic exercise;Patient/family education;Manual lymph drainage;Aquatic Therapy;DME Instruction;Compression bandaging;Passive range of motion;Taping;Orthotic Fit/Training;Manual techniques    PT Next Visit Plan MLD to left leg , consider kinesiotaping to left thigh?  P/AA/AROM to left leg protecting overstretching low back. ?would he benefit from another type of lumbar support? progress to standing exercise , balance and gait.  Consider transfer to [facility] in October    Consulted and Agree with Plan of Care Patient             Patient will benefit from skilled therapeutic intervention in order to improve the following deficits and impairments:  Abnormal gait, Decreased balance, Decreased endurance, Decreased mobility, Difficulty walking, Increased  muscle spasms, Decreased range of motion, Decreased knowledge of precautions, Decreased activity tolerance, Decreased strength, Increased fascial restricitons, Impaired flexibility, Pain  Visit Diagnosis: Weakness of left leg - Plan: PT plan of care cert/re-cert  Joint stiffness of left lower leg - Plan: PT plan of care cert/re-cert  Lymphedema, not elsewhere classified - Plan: PT plan of care cert/re-cert  Difficulty in walking, not elsewhere classified - Plan: PT plan of care cert/re-cert     Problem List Patient Active Problem List   Diagnosis Date Noted   IDA (iron deficiency anemia) 09/22/2020   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

## 2021-02-03 NOTE — Therapy (Signed)
[place], [place], [address] Phone: [phone]   Fax:  [phone]  Physical Therapy Evaluation  Patient Details  Name: EDMAN KAVA MRN: [id number] Date of Birth: 10-Jul-1955 Referring Provider (PT): Dr. Marin Olp   Encounter Date: 02/03/2021   PT End of Session - 02/03/21 1659     Visit Number 1    Number of Visits 26   pt will likely not use all visits   Date for PT Re-Evaluation 05/06/21    PT Start Time 1200    PT Stop Time 1300    PT Time Calculation (min) 60 min    Activity Tolerance Patient tolerated treatment well    Behavior During Therapy [facility] for tasks assessed/performed             Past Medical History:  Diagnosis Date   Cancer [facility])    bladder   History of bladder cancer    s/p  TURBT 07-25-2014   Hyperlipidemia    Mass of left hand    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, MD;  Location: [facility];  Service: General;  Laterality: N/A;   HERNIA REPAIR     INGUINAL HERNIA REPAIR Bilateral 2006   INTRAVASCULAR ULTRASOUND/IVUS N/A 03/13/2020   Procedure: Intravascular Ultrasound/IVUS;  Surgeon: Elam Dutch, MD;  Location: [place] [facility];  Service: Cardiovascular;  Laterality: N/A;   IR IMAGING GUIDED PORT INSERTION  06/01/2020   IR INTRAVASCULAR ULTRASOUND NON  CORONARY  06/08/2020   IR INTRAVASCULAR ULTRASOUND NON CORONARY  06/17/2020   IR IVC FILTER PLMT / S&I /IMG GUID/MOD SED  06/08/2020   IR PTA VENOUS EXCEPT DIALYSIS CIRCUIT  06/08/2020   IR RADIOLOGIST EVAL & MGMT  04/30/2020   IR RADIOLOGIST EVAL & MGMT  05/27/2020   IR RADIOLOGIST EVAL & MGMT  07/21/2020   IR TRANSCATH PLC STENT  INITIAL VEIN  INC ANGIOPLASTY  06/17/2020   IR TRANSCATH PLC STENT 1ST ART NOT LE CV CAR VERT CAR  06/08/2020   IR US GUIDE VASC ACCESS LEFT  06/08/2020   IR US GUIDE VASC ACCESS LEFT  06/17/2020   IR VENO/EXT/UNI LEFT  06/08/2020   IR VENO/EXT/UNI LEFT  06/17/2020   IVC VENOGRAPHY N/A 03/13/2020   Procedure: IVC Venography;  Surgeon: Elam Dutch, MD;  Location: [place] [facility];  Service: Cardiovascular;  Laterality: N/A;   LOWER EXTREMITY VENOGRAPHY Left 03/13/2020   Procedure: LOWER EXTREMITY VENOGRAPHY;  Surgeon: Elam Dutch, MD;  Location: [place] [facility];  Service: Cardiovascular;  Laterality: Left;   MASS EXCISION Left 11/12/2018   Procedure: EXCISION MASS LEFT HAND;  Surgeon: Daryll Brod, MD;  Location: [place];  Service: Orthopedics;  Laterality: Left;  FAB   PERIPHERAL VASCULAR INTERVENTION  03/13/2020   Procedure: PERIPHERAL VASCULAR INTERVENTION;  Surgeon: Elam Dutch, MD;  Location: [place] [facility];  Service: Cardiovascular;;   PERIPHERAL VASCULAR THROMBECTOMY N/A 03/13/2020   Procedure: PERIPHERAL VASCULAR THROMBECTOMY;  Surgeon: Elam Dutch, MD;  Location: [place] [facility];  Service: Cardiovascular;  Laterality: N/A;   RADIOLOGY WITH ANESTHESIA N/A 02/20/2020   Procedure: MRI LUMBAR W/O CONTRAST  WITH ANESTHESIA;  Surgeon: Radiologist, Medication, MD;  Location: [place];  Service: Radiology;  Laterality: N/A;   RADIOLOGY WITH ANESTHESIA N/A 06/08/2020   Procedure: IR WITH ANESTHESIA VENOGRAM;  Surgeon: Radiologist, Medication, MD;  Location: [place];  Service: Radiology;  Laterality: N/A;   TESTICLE SURGERY  2004    Ruptured Undescended Right testicle    TRANSURETHRAL RESECTION OF BLADDER TUMOR WITH GYRUS (TURBT-GYRUS) N/A 05/23/2014   Procedure: TRANSURETHRAL RESECTION OF BLADDER TUMOR WITH GYRUS (TURBT-GYRUS);  Surgeon: [name], MD;  Location: [facility];  Service: Urology;  Laterality: N/A;   WISDOM TOOTH EXTRACTION      There were no vitals filed for this visit.    Subjective Assessment - 02/03/21 1203     Subjective Pt says today he is feeling pretty good today.  He says he gots a sleep number bed and thinks that will help.He is back to using Flexitouch twice a day.  He couldn't use it for a while because of the pain.  He feels like he has his meds straightened out and he can manage the side effects now  He has not been doing much exercise because of the pain .  He has taken the past 2 weeks off as his wife had surgery and his sister in law is here. His daughter has been here also.Pt is interested in getting manual lymph drainage at this point and them go to his PT at [facility] as he would like to have aquatic therapy.    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 He started chemo and radiation and had treatment to help stent the blood clot and circulation is improved. He wears wears thigh high and below knee compression hose since November 2021.  Addendum 8/24:  He tried immunotherapy in the winter and spring of 2022, but had increaesd pain as the tumor grew and became more active. He had severly increased pain and more weakness He stopped that and is now on another medications that will have 3 weeks on and 2 weeks off cycle  He now uses ( at different times)  compression running pants to help with the fullness and  discomfort in is left upper thigh. compression shorts  circular knit compression stocking as well as Flexitouch. Depsite this, he still has swelling in his left thigh    Limitations Walking;House hold activities;Sitting   sitting tends to make swelling increase   How long can you sit comfortably? an hour or two    How long can you stand comfortably? 15 minutes    How long can you walk comfortably? with rolling walker very limited    Patient Stated Goals to get back to walking with the cane ( he now has to use the rolling walker now)    Currently in Pain? Yes    Pain Score 2    pt is  medicated   Pain Location Hip    Pain Orientation Posterior;Medial   wraps around the back of his hip to the middle   Pain Descriptors / Indicators Constant;Sharp    Pain Type Chronic pain    Pain Radiating Towards around hip to the mid thigh    Pain Onset More than a month ago    Pain Frequency Constant    Aggravating Factors  any "over activites    Pain Relieving Factors meds, pressure point on the back of the knee. Flexitouch, hydotherapy tank has helped in the past    Effect of Pain on Daily Activities limiting everything at this point                [facility] PT Assessment - 02/03/21 0001       Assessment   Medical Diagnosis bladder cancer    Referring Provider (PT) Dr. Marin Olp    Onset Date/Surgical Date 12/12/19   approximate time   [place] residence    Living Arrangements Spouse/significant other    Available Help at Discharge Available 24 hours/day      Prior Function   Level of Independence Independent    Vocation Full time employment    Dispensing optician, working from home    Leisure has a gym at home with [employer] and [employer], full barballs uses beachbody app with functional strength      Cognition   Overall Cognitive Status Within Functional Limits for tasks assessed      Observation/Other  Assessments   Observations pt walks into clinic with rollator , has a circular knit compression stocking and a sacroiliac belt that his daughter suggested. He has multiple dark red areas over his body form where he has fallen      Sensation   Light Touch Appears Intact      Coordination   Gross Motor Movements are Fluid and Coordinated No   weakness in left leg apparent     Functional Tests   Functional tests Sit to Stand      Sit to Stand   Comments 6   pt is pushing up with hands a mild amount and has most of weight on right leg that had visible tremor effort . RPE 5/10     Posture/Postural Control   Posture/Postural Control Postural limitations    Postural Limitations Rounded Shoulders;Forward head;Flexed trunk      ROM / Strength   AROM / PROM / Strength AROM;Strength      AROM   Overall AROM  Within functional limits for tasks performed    Overall AROM Comments "stiffness" in left leg , especially in ankle      Strength   Overall Strength Deficits    Overall Strength Comments weakness and atrophy in left leg functionally, pt is unable to bridge on left leg    Right Hand Grip (lbs) 50    Left Hand Grip (lbs) 70    Right/Left Hip Right;Left    Right Hip Flexion 5/5    Right Hip Extension 5/5    Right Hip ADduction --    Left Hip Flexion 2+/5    Left Hip Extension 2+/5    Left Hip ABduction 2+/5    Left Hip ADduction 1/5    Right/Left Knee Right;Left    Right Knee Flexion 5/5    Right Knee Extension 5/5    Left Knee Flexion 2+/5  Left Knee Extension 3+/5    Right/Left Ankle Left    Left Ankle Dorsiflexion 3+/5   limited by stiffness and edema   Left Ankle Plantar Flexion 4/5      Palpation   Palpation comment pitting edema in left foot and ankle      Ambulation/Gait   Gait Comments pt reports he is not able to walk very far or do much exercise while he had so much pain      Standardized Balance Assessment   Standardized Balance Assessment Timed Up and Go  Test    10 Meter Walk --      Timed Up and Go Test   TUG Normal TUG    Normal TUG (seconds) 19.57   pt used a rollator     High Level Balance   High Level Balance Comments pt cannot do tandem stance of single leg stance on left leg               LYMPHEDEMA/ONCOLOGY QUESTIONNAIRE - 02/03/21 0001       Type   Cancer Type 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  with DVT      Treatment   Active Chemotherapy Treatment Yes      What other symptoms do you have   Are you Having Heaviness or Tightness Yes    Are you having Pain Yes    Are you having pitting edema Yes    Body Site left foot and ankle    Is it Hard or Difficult finding clothes that fit No    Do you have infections No    Is there Decreased scar mobility No    Stemmer Sign Yes      Lymphedema Stage   Stage STAGE 2 SPONTANEOUSLY IRREVERSIBLE      Lymphedema Assessments   Lymphedema Assessments Lower extremities      Left Lower Extremity Lymphedema   20 cm Proximal to Suprapatella 48 cm    10 cm Proximal to Suprapatella 41.5 cm    At Midpatella/Popliteal Crease 38 cm    30 cm Proximal to Floor at Lateral Plantar Foot 30 cm    20 cm Proximal to Floor at Lateral Plantar Foot 23.3 cm    10 cm Proximal to Floor at Lateral Malleoli 25.1 cm    5 cm Proximal to 1st MTP Joint 26 cm    Around Proximal Great Toe 8.9 cm                     Objective measurements completed on examination: See above findings.       [facility] Adult PT Treatment/Exercise - 02/03/21 0001       Exercises   Exercises Other Exercises    Other Exercises  encouraged pt to do short arc quads over a bolser, ankle ROM and stretched      Manual Therapy   Manual Therapy Edema management;Manual Lymphatic Drainage (MLD)    Manual therapy comments remesured leg    Soft tissue mobilization in right sidelying, stationary circles to left axillary nodes, left axillo-inguinal anastamosis, left hip and thigh, lower leg, extra time on  foot with return along pathways.    Passive ROM stretching to left ankle                      PT Short Term Goals - 02/03/21 1712       PT SHORT TERM GOAL #1   Title Pt will report that  the pain in his left leg is never more than a 2    Time 4    Period Weeks    Status New      PT SHORT TERM GOAL #2   Title Pt will report that he is able to do a beginning exercise program at home for leg strength daily    Time 4    Period Weeks    Status New      PT SHORT TERM GOAL #3   Title Pt will report that he is able to manage his left leg swelling at home and is ready to progress to all exericse at [facility]  in his PT treatment    Time 4    Period Weeks    Status New               PT Long Term Goals - 02/03/21 1714       PT LONG TERM GOAL #1   Title Pt will report he is doing a walking and exercise program daily at home    Time 12    Period Weeks    Status New      PT LONG TERM GOAL #2   Title Pt will increase the number of sit to stands in 30 seconds to > 10 indicating an improvment in LE functional strength    Baseline 6 on 02/03/2021    Time 12    Period Weeks    Status New      PT LONG TERM GOAL #3   Title Pt will decrease his TUG score to < 12seconds showing an improvment in functional mobility and decrease risk of fall    Baseline 19.57 with rollator on 02/03/2021    Time 12    Period Weeks    Status New      PT LONG TERM GOAL #4   Title Pt will be able to walk 50 feet with a straight cane without balance loss    Time 12    Period Weeks    Status New                    Plan - 02/03/21 1700     Clinical Impression Statement Pt comes back to PT with progression of his metastatic lesion in his spine during a course of immunologic treatment.  He has severe debilitating pain during this time with decreased functional mobility and increased swelling in his leg.  He is now on a different regimen of 3 weeks on, 2 weeks of treatment  and his symptoms have stabalized.  He is able to return to PT for treatment of his swelling and exercise with the goal to return to walking with a straight cane. He has had a decrease in his objective tests since last he was here.  He would like to receive MLD to help decrease swelling and pain in his left hip and leg and begin exercise to left leg at our clinic.  Then, he would like to transfer to the [facility] for continued exercise on the equipment there and hopefully in the pool.    Personal Factors and Comorbidities Comorbidity 3+    Comorbidities lumbar metastatic lesion, chronic DVT, ongoing chemotherapy    Examination-Activity Limitations Locomotion Level;Stairs;Stand;Squat;Lift;Bend;Transfers    Stability/Clinical Decision Making Evolving/Moderate complexity    Rehab Potential Excellent    PT Frequency 1x / week   may go up to 2 times a week possibly, may miss a few  weeks duing chemo cycling   PT Duration 12 weeks    PT Treatment/Interventions ADLs/Self Care Home Management;Electrical Stimulation;Moist Heat;Gait training;Stair training;Functional mobility training;Therapeutic activities;Neuromuscular re-education;Balance training;Therapeutic exercise;Patient/family education;Manual lymph drainage;Aquatic Therapy;DME Instruction;Compression bandaging;Passive range of motion;Taping;Orthotic Fit/Training;Manual techniques    PT Next Visit Plan MLD to left leg , consider kinesiotaping to left thigh?  P/AA/AROM to left leg protecting overstretching low back. ?would he benefit from another type of lumbar support? progress to standing exercise , balance and gait.  Consider transfer to [place] in October    Consulted and Agree with Plan of Care Patient             Patient will benefit from skilled therapeutic intervention in order to improve the following deficits and impairments:  Abnormal gait, Decreased balance, Decreased endurance, Decreased mobility, Difficulty walking, Increased  muscle spasms, Decreased range of motion, Decreased knowledge of precautions, Decreased activity tolerance, Decreased strength, Increased fascial restricitons, Impaired flexibility, Pain  Visit Diagnosis: Weakness of left leg - Plan: PT plan of care cert/re-cert  Joint stiffness of left lower leg - Plan: PT plan of care cert/re-cert  Lymphedema, not elsewhere classified - Plan: PT plan of care cert/re-cert  Difficulty in walking, not elsewhere classified - Plan: PT plan of care cert/re-cert     Problem List Patient Active Problem List   Diagnosis Date Noted   IDA (iron deficiency anemia) 09/22/2020   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 Owens Shark PT  [place] Levo 02/03/2021, 5:21 PM  [place], [place], [address] Phone: [phone]   Fax:  [phone]  Name: KOAN HAGGSTROM MRN: [id number] Date of Birth: 1955/07/06

## 2021-02-08 ENCOUNTER — Other Ambulatory Visit: Payer: Self-pay | Admitting: Hematology & Oncology

## 2021-02-10 ENCOUNTER — Inpatient Hospital Stay (HOSPITAL_BASED_OUTPATIENT_CLINIC_OR_DEPARTMENT_OTHER): Payer: 59 | Admitting: Hematology & Oncology

## 2021-02-10 ENCOUNTER — Encounter: Payer: Self-pay | Admitting: Hematology & Oncology

## 2021-02-10 ENCOUNTER — Encounter: Payer: Self-pay | Admitting: *Deleted

## 2021-02-10 ENCOUNTER — Inpatient Hospital Stay: Payer: 59

## 2021-02-10 ENCOUNTER — Other Ambulatory Visit: Payer: Self-pay | Admitting: Hematology & Oncology

## 2021-02-10 ENCOUNTER — Other Ambulatory Visit: Payer: Self-pay

## 2021-02-10 ENCOUNTER — Other Ambulatory Visit (HOSPITAL_BASED_OUTPATIENT_CLINIC_OR_DEPARTMENT_OTHER): Payer: Self-pay

## 2021-02-10 VITALS — BP 109/74 | HR 78 | Temp 98.4°F | Resp 17 | Wt 160.0 lb

## 2021-02-10 DIAGNOSIS — C799 Secondary malignant neoplasm of unspecified site: Secondary | ICD-10-CM | POA: Diagnosis not present

## 2021-02-10 DIAGNOSIS — C679 Malignant neoplasm of bladder, unspecified: Secondary | ICD-10-CM | POA: Diagnosis not present

## 2021-02-10 DIAGNOSIS — Z5112 Encounter for antineoplastic immunotherapy: Secondary | ICD-10-CM | POA: Diagnosis not present

## 2021-02-10 LAB — CBC WITH DIFFERENTIAL (CANCER CENTER ONLY)
Abs Immature Granulocytes: 0.15 10*3/uL — ABNORMAL HIGH (ref 0.00–0.07)
Basophils Absolute: 0 10*3/uL (ref 0.0–0.1)
Basophils Relative: 0 %
Eosinophils Absolute: 0.1 10*3/uL (ref 0.0–0.5)
Eosinophils Relative: 1 %
HCT: 28.6 % — ABNORMAL LOW (ref 39.0–52.0)
Hemoglobin: 9.5 g/dL — ABNORMAL LOW (ref 13.0–17.0)
Immature Granulocytes: 1 %
Lymphocytes Relative: 1 %
Lymphs Abs: 0.2 10*3/uL — ABNORMAL LOW (ref 0.7–4.0)
MCH: 31.1 pg (ref 26.0–34.0)
MCHC: 33.2 g/dL (ref 30.0–36.0)
MCV: 93.8 fL (ref 80.0–100.0)
Monocytes Absolute: 0.4 10*3/uL (ref 0.1–1.0)
Monocytes Relative: 2 %
Neutro Abs: 17.5 10*3/uL — ABNORMAL HIGH (ref 1.7–7.7)
Neutrophils Relative %: 95 %
Platelet Count: 143 10*3/uL — ABNORMAL LOW (ref 150–400)
RBC: 3.05 MIL/uL — ABNORMAL LOW (ref 4.22–5.81)
RDW: 21.8 % — ABNORMAL HIGH (ref 11.5–15.5)
WBC Count: 18.3 10*3/uL — ABNORMAL HIGH (ref 4.0–10.5)
nRBC: 0 % (ref 0.0–0.2)

## 2021-02-10 LAB — CMP (CANCER CENTER ONLY)
ALT: 14 U/L (ref 0–44)
AST: 9 U/L — ABNORMAL LOW (ref 15–41)
Albumin: 3.6 g/dL (ref 3.5–5.0)
Alkaline Phosphatase: 54 U/L (ref 38–126)
Anion gap: 7 (ref 5–15)
BUN: 29 mg/dL — ABNORMAL HIGH (ref 8–23)
CO2: 29 mmol/L (ref 22–32)
Calcium: 9.3 mg/dL (ref 8.9–10.3)
Chloride: 94 mmol/L — ABNORMAL LOW (ref 98–111)
Creatinine: 1.3 mg/dL — ABNORMAL HIGH (ref 0.61–1.24)
GFR, Estimated: >60 mL/min (ref >60)
Glucose, Bld: 169 mg/dL — ABNORMAL HIGH (ref 70–99)
Potassium: 5 mmol/L (ref 3.5–5.1)
Sodium: 130 mmol/L — ABNORMAL LOW (ref 135–145)
Total Bilirubin: 0.5 mg/dL (ref 0.3–1.2)
Total Protein: 6.2 g/dL — ABNORMAL LOW (ref 6.5–8.1)

## 2021-02-10 LAB — LACTATE DEHYDROGENASE: LDH: 236 U/L — ABNORMAL HIGH (ref 98–192)

## 2021-02-10 LAB — SAMPLE TO BLOOD BANK

## 2021-02-10 LAB — PREALBUMIN: Prealbumin: 31.7 mg/dL (ref 18–38)

## 2021-02-10 MED ORDER — PALONOSETRON HCL INJECTION 0.25 MG/5ML
0.2500 mg | Freq: Once | INTRAVENOUS | Status: AC
  Administered 2021-02-10: 0.25 mg via INTRAVENOUS
  Filled 2021-02-10: qty 5

## 2021-02-10 MED ORDER — SODIUM CHLORIDE 0.9% FLUSH
10.0000 mL | INTRAVENOUS | Status: DC | PRN
  Administered 2021-02-10: 10 mL

## 2021-02-10 MED ORDER — HEPARIN SOD (PORK) LOCK FLUSH 100 UNIT/ML IV SOLN
500.0000 [IU] | Freq: Once | INTRAVENOUS | Status: AC | PRN
  Administered 2021-02-10: 500 [IU]

## 2021-02-10 MED ORDER — SODIUM CHLORIDE 0.9 % IV SOLN
1.2300 mg/kg | Freq: Once | INTRAVENOUS | Status: AC
  Administered 2021-02-10: 90 mg via INTRAVENOUS
  Filled 2021-02-10: qty 9

## 2021-02-10 MED ORDER — FENTANYL 100 MCG/HR TD PT72
1.0000 | MEDICATED_PATCH | TRANSDERMAL | 0 refills | Status: DC
  Filled 2021-02-10 – 2021-02-16 (×4): qty 15, 30d supply, fill #0

## 2021-02-10 MED ORDER — SODIUM CHLORIDE 0.9 % IV SOLN
10.0000 mg | Freq: Once | INTRAVENOUS | Status: AC
  Administered 2021-02-10: 10 mg via INTRAVENOUS
  Filled 2021-02-10: qty 10

## 2021-02-10 MED ORDER — SODIUM CHLORIDE 0.9 % IV SOLN: Freq: Once | INTRAVENOUS | Status: AC

## 2021-02-10 NOTE — Patient Instructions (Signed)
[facility] [place]  Discharge Instructions: Thank you for choosing [facility] to provide your oncology and hematology care.   If you have a lab appointment with the [facility], please go directly to the [facility] and check in at the registration area.  Wear comfortable clothing and clothing appropriate for easy access to any Portacath or PICC line.   We strive to give you quality time with your provider. You may need to reschedule your appointment if you arrive late (15 or more minutes).  Arriving late affects you and other patients whose appointments are after yours.  Also, if you miss three or more appointments without notifying the office, you may be dismissed from the clinic at the provider's discretion.      For prescription refill requests, have your pharmacy contact our office and allow 72 hours for refills to be completed.    Today you received the following chemotherapy and/or immunotherapy agents Padcev      To help prevent nausea and vomiting after your treatment, we encourage you to take your nausea medication as directed.  BELOW ARE SYMPTOMS THAT SHOULD BE REPORTED IMMEDIATELY: *FEVER GREATER THAN 100.4 F (38 C) OR HIGHER *CHILLS OR SWEATING *NAUSEA AND VOMITING THAT IS NOT CONTROLLED WITH YOUR NAUSEA MEDICATION *UNUSUAL SHORTNESS OF BREATH *UNUSUAL BRUISING OR BLEEDING *URINARY PROBLEMS (pain or burning when urinating, or frequent urination) *BOWEL PROBLEMS (unusual diarrhea, constipation, pain near the anus) TENDERNESS IN MOUTH AND THROAT WITH OR WITHOUT PRESENCE OF ULCERS (sore throat, sores in mouth, or a toothache) UNUSUAL RASH, SWELLING OR PAIN  UNUSUAL VAGINAL DISCHARGE OR ITCHING   Items with * indicate a potential emergency and should be followed up as soon as possible or go to the Emergency Department if any problems should occur.  Please show the CHEMOTHERAPY ALERT CARD or IMMUNOTHERAPY ALERT CARD at check-in to the  Emergency Department and triage nurse. Should you have questions after your visit or need to cancel or reschedule your appointment, please contact [facility]  [phone] and follow the prompts.  Office hours are 8:00 a.m. to 4:30 p.m. Monday - Friday. Please note that voicemails left after 4:00 p.m. may not be returned until the following business day.  We are closed weekends and major holidays. You have access to a nurse at all times for urgent questions. Please call the main number to the clinic [phone] and follow the prompts.  For any non-urgent questions, you may also contact your provider using MyChart. We now offer e-Visits for anyone 106 and older to request care online for non-urgent symptoms. For details visit [email or web address].   Also download the MyChart app! Go to the app store, search "MyChart", open the app, select [facility], and log in with your MyChart username and password.  Due to Covid, a mask is required upon entering the hospital/clinic. If you do not have a mask, one will be given to you upon arrival. For doctor visits, patients may have 1 support person aged 13 or older with them. For treatment visits, patients cannot have anyone with them due to current Covid guidelines and our immunocompromised population.

## 2021-02-10 NOTE — Progress Notes (Signed)
Hematology and Oncology Follow Up Visit  Brian Benton XB:[id number] 1955-10-14 65 y.o. 02/10/2021   Principle Diagnosis:  Metastatic bladder cancer-localized disease Extensive thrombus of the left leg-status post thrombectomy and iliac stent  Current Therapy:   Curative radiation with weekly cis-platinum-status post 6 cycle of weekly cis-platinum  --  Completed 04/08/2020  dd MVAC -- s/p cycle #3 -- started on 06/02/2020  --DC on 07/28/2020 secondary to toxicity Eliquis 5 mg p.o. twice daily Cis-platinum/Gemzar-s/p cycle #3 - start on 08/05/2020 - d/c on 10/08/2020 Pembrolizumab 200 mg/m2 IV q 3 week -- s/p cycle #3-- start on 10/14/2020 -- d/c due to progression Padcev -- s/p cycle #1 -- start on 01/13/2021     Interim History:  Brian Benton is back for a follow-up.  Overall, he may feel a bit better.  He says his pain is doing pretty well.  He is on a fentanyl patch.  He says he takes tramadol for breakthrough pain.  He says this seems to be working fairly well.  He was seen by his oncologist at [facility].  The oncologist at [facility] did agree with our change to [facility].  He is still working.  Thankfully, he is not traveling all that much.  I think he says next trip is going to be in October down to [place].  He has had no problems with bleeding.  He is on Eliquis.  His left leg is not as swollen.  Has a compression stocking on the leg.  There is been no issues with cough or shortness of breath.  His appetite is doing a little bit better.  He is gained 9 pounds since we last saw him.  Overall, he seems to have done well with the Padcev.  He has had no problems with nausea or vomiting.  He has had no bleeding.  Has had no headache.  Overall, I would have to say that his performance status is probably ECOG 2.       Medications:  Current Outpatient Medications:    apixaban (ELIQUIS) 5 MG TABS tablet, TAKE 1 TABLET (5 MG TOTAL) BY MOUTH 2 (TWO) TIMES DAILY., Disp: 60 tablet, Rfl: 5   ASPIRIN  LOW DOSE 81 MG EC tablet, TAKE 1 TABLET BY MOUTH EVERY DAY, Disp: 30 tablet, Rfl: 11   Calcium Carbonate-Vit D-Min (CALTRATE PLUS PO), Take 1 tablet by mouth daily., Disp: , Rfl:    dexamethasone (DECADRON) 4 MG tablet, Take 2 tablets (8 mg total) by mouth daily. Start the day after chemotherapy for 2 days., Disp: 30 tablet, Rfl: 1   dexamethasone (DECADRON) 6 MG tablet, Take 3 tablets (18 mg total) by mouth daily. **You MUST take with food in the morning.**, Disp: 90 tablet, Rfl: 4   fentaNYL (DURAGESIC) 100 MCG/HR, Place 1 patch onto the skin every other day., Disp: 15 patch, Rfl: 0   gabapentin (NEURONTIN) 600 MG tablet, TAKE 1-2 TABLETS (600 MG TOTAL) BY MOUTH IN THE MORNING, AT NOON, IN THE EVENING,AND AT BEDTIME, Disp: 120 tablet, Rfl: 3   gabapentin (NEURONTIN) 600 MG tablet, Take 2 tablets (1,200 mg total) by mouth in the morning, at noon, in the evening, and at bedtime., Disp: 240 tablet, Rfl: 3   lactulose, encephalopathy, (CHRONULAC) 10 GM/15ML SOLN, TAKE 30MLS BY MOUTH EVERY 4 HOURS AS NEEDED, Disp: 450 mL, Rfl: 2   lidocaine-prilocaine (EMLA) cream, Apply to affected area once, Disp: 30 g, Rfl: 3   LORazepam (ATIVAN) 0.5 MG tablet, Take 2 tablets (1 mg  total) by mouth every 6 (six) hours as needed for anxiety., Disp: 120 tablet, Rfl: 1   LORazepam (ATIVAN) 0.5 MG tablet, Take 1 tablet (0.5 mg total) by mouth every 6 (six) hours as needed (Nausea or vomiting)., Disp: 30 tablet, Rfl: 0   Multiple Vitamins-Minerals (MULTIVITAMIN WITH MINERALS) tablet, Take 1 tablet by mouth daily., Disp: , Rfl:    ondansetron (ZOFRAN) 8 MG tablet, Take 1 tablet (8 mg total) by mouth 2 (two) times daily as needed for refractory nausea / vomiting. Start on day 3 after chemo., Disp: 30 tablet, Rfl: 1   ondansetron (ZOFRAN-ODT) 8 MG disintegrating tablet, TAKE 1 TABLET BY MOUTH EVERY 8 HOURS AS NEEDED FOR NAUSEA & VOMITING, Disp: 30 tablet, Rfl: 0   orphenadrine (NORFLEX) 100 MG tablet, TAKE 1 TABLET (100 MG  TOTAL) BY MOUTH 2 (TWO) TIMES DAILY., Disp: 60 tablet, Rfl: 0   oxyCODONE (OXYCONTIN) 10 mg 12 hr tablet, Take 1 tablet (10 mg total) by mouth every 4 (four) hours as needed., Disp: 90 tablet, Rfl: 0   pantoprazole (PROTONIX) 40 MG tablet, Take 1 tablet (40 mg total) by mouth 2 (two) times daily., Disp: 60 tablet, Rfl: 3   prochlorperazine (COMPAZINE) 10 MG tablet, TAKE 1 TABLET (10 MG TOTAL) BY MOUTH EVERY 6 (SIX) HOURS AS NEEDED FOR NAUSEA OR VOMITING., Disp: 30 tablet, Rfl: 1   prochlorperazine (COMPAZINE) 10 MG tablet, Take 1 tablet (10 mg total) by mouth every 6 (six) hours as needed (Nausea or vomiting)., Disp: 30 tablet, Rfl: 1   sodium chloride (OCEAN) 0.65 % SOLN nasal spray, Place 1 spray into both nostrils daily as needed for congestion (Allergies)., Disp: , Rfl:    temazepam (RESTORIL) 15 MG capsule, Take 1 capsule (15 mg total) by mouth at bedtime as needed for sleep., Disp: 30 capsule, Rfl: 0   traMADol (ULTRAM) 50 MG tablet, TAKE 2 TABLETS BY MOUTH EVERY 6 HOURS AS NEEDED., Disp: 120 tablet, Rfl: 0   HYDROmorphone (DILAUDID) 8 MG tablet, Take 1 tablet (8 mg total) by mouth every 6 (six) hours as needed for severe pain. (Patient not taking: Reported on 02/10/2021), Disp: 120 tablet, Rfl: 0  Allergies: No Known Allergies  Past Medical History, Surgical history, Social history, and Family History were reviewed and updated.  Review of Systems: Review of Systems  Constitutional: Negative.   HENT:  Negative.    Eyes: Negative.   Respiratory: Negative.    Cardiovascular:  Positive for leg swelling.  Gastrointestinal: Negative.   Endocrine: Negative.   Musculoskeletal:  Positive for flank pain.  Skin: Negative.   Neurological: Negative.   Hematological: Negative.   Psychiatric/Behavioral: Negative.     Physical Exam:  weight is 160 lb (72.6 kg). His oral temperature is 98.4 F (36.9 C). His blood pressure is 109/74 and his pulse is 78. His respiration is 17 and oxygen saturation  is 93%.   Wt Readings from Last 3 Encounters:  02/10/21 160 lb (72.6 kg)  01/13/21 151 lb (68.5 kg)  12/16/20 162 lb (73.5 kg)    Physical Exam Vitals reviewed.  HENT:     Head: Normocephalic and atraumatic.  Eyes:     Pupils: Pupils are equal, round, and reactive to light.  Cardiovascular:     Rate and Rhythm: Normal rate and regular rhythm.     Heart sounds: Normal heart sounds.  Pulmonary:     Effort: Pulmonary effort is normal.     Breath sounds: Normal breath sounds.  Abdominal:  General: Bowel sounds are normal.     Palpations: Abdomen is soft.  Musculoskeletal:        General: No tenderness or deformity. Normal range of motion.     Cervical back: Normal range of motion.     Comments: Extremities shows minimal swelling of the left leg.  He has good range of motion of the leg.  He has good pulses in his distal extremities.  The right leg is unremarkable.  Lymphadenopathy:     Cervical: No cervical adenopathy.  Skin:    General: Skin is warm and dry.     Findings: No erythema or rash.  Neurological:     Mental Status: He is alert and oriented to person, place, and time.  Psychiatric:        Behavior: Behavior normal.        Thought Content: Thought content normal.        Judgment: Judgment normal.   Lab Results  Component Value Date   WBC 18.3 (H) 02/10/2021   HGB 9.5 (L) 02/10/2021   HCT 28.6 (L) 02/10/2021   MCV 93.8 02/10/2021   PLT 143 (L) 02/10/2021     Chemistry      Component Value Date/Time   NA 135 01/27/2021 1115   K 4.7 01/27/2021 1115   CL 100 01/27/2021 1115   CO2 26 01/27/2021 1115   BUN 34 (H) 01/27/2021 1115   CREATININE 1.15 01/27/2021 1115      Component Value Date/Time   CALCIUM 9.2 01/27/2021 1115   ALKPHOS 52 01/27/2021 1115   AST 10 (L) 01/27/2021 1115   ALT 13 01/27/2021 1115   BILITOT 0.4 01/27/2021 1115      Impression and Plan: Mr. Grambo is a really nice 65 year old white male.  He has a very interesting problem.  He  had in situ bladder cancer about 5 years ago.  Now, he has a recurrence that I would consider metastatic.  This is outside of the bladder.  It is not operable as it is appear to be invading to the psoas muscle and also involving his lower spine.  Hopefully, the Padcev is helping.  I think it is still too early for [place] to do a scan on him.  I probably would do 3 cycles and then rescan him.  I am glad that he gained weight.  I think this is helpful.  We really cannot quality of life.  This is really what is important for [place].  I will plan to get him back in late September for his third cycle of treatment.  Again, I will scan him after the third cycle.    Volanda Napoleon, MD 8/31/202212:49 PM

## 2021-02-10 NOTE — Patient Instructions (Signed)
Implanted Port Home Guide An implanted port is a device that is placed under the skin. It is usually placed in the chest. The device can be used to give IV medicine, to take blood, or for dialysis. You may have an implanted port if: You need IV medicine that would be irritating to the small veins in your hands or arms. You need IV medicines, such as antibiotics, for a long period of time. You need IV nutrition for a long period of time. You need dialysis. When you have a port, your health care provider can choose to use the port instead of veins in your arms for these procedures. You may have fewer limitations when using a port than you would if you used other types of long-term IVs, and you will likely be able to return to normal activities after your incision heals. An implanted port has two main parts: Reservoir. The reservoir is the part where a needle is inserted to give medicines or draw blood. The reservoir is round. After it is placed, it appears as a small, raised area under your skin. Catheter. The catheter is a thin, flexible tube that connects the reservoir to a vein. Medicine that is inserted into the reservoir goes into the catheter and then into the vein. How is my port accessed? To access your port: A numbing cream may be placed on the skin over the port site. Your health care provider will put on a mask and sterile gloves. The skin over your port will be cleaned carefully with a germ-killing soap and allowed to dry. Your health care provider will gently pinch the port and insert a needle into it. Your health care provider will check for a blood return to make sure the port is in the vein and is not clogged. If your port needs to remain accessed to get medicine continuously (constant infusion), your health care provider will place a clear bandage (dressing) over the needle site. The dressing and needle will need to be changed every week, or as told by your health care provider. What  is flushing? Flushing helps keep the port from getting clogged. Follow instructions from your health care provider about how and when to flush the port. Ports are usually flushed with saline solution or a medicine called heparin. The need for flushing will depend on how the port is used: If the port is only used from time to time to give medicines or draw blood, the port may need to be flushed: Before and after medicines have been given. Before and after blood has been drawn. As part of routine maintenance. Flushing may be recommended every 4-6 weeks. If a constant infusion is running, the port may not need to be flushed. Throw away any syringes in a disposal container that is meant for sharp items (sharps container). You can buy a sharps container from a pharmacy, or you can make one by using an empty hard plastic bottle with a cover. How long will my port stay implanted? The port can stay in for as long as your health care provider thinks it is needed. When it is time for the port to come out, a surgery will be done to remove it. The surgery will be similar to the procedure that was done to put the port in. Follow these instructions at home:  Flush your port as told by your health care provider. If you need an infusion over several days, follow instructions from your health care provider about how   to take care of your port site. Make sure you: Wash your hands with soap and water before you change your dressing. If soap and water are not available, use alcohol-based hand sanitizer. Change your dressing as told by your health care provider. Place any used dressings or infusion bags into a plastic bag. Throw that bag in the trash. Keep the dressing that covers the needle clean and dry. Do not get it wet. Do not use scissors or sharp objects near the tube. Keep the tube clamped, unless it is being used. Check your port site every day for signs of infection. Check for: Redness, swelling, or  pain. Fluid or blood. Pus or a bad smell. Protect the skin around the port site. Avoid wearing bra straps that rub or irritate the site. Protect the skin around your port from seat belts. Place a soft pad over your chest if needed. Bathe or shower as told by your health care provider. The site may get wet as long as you are not actively receiving an infusion. Return to your normal activities as told by your health care provider. Ask your health care provider what activities are safe for you. Carry a medical alert card or wear a medical alert bracelet at all times. This will let health care providers know that you have an implanted port in case of an emergency. Get help right away if: You have redness, swelling, or pain at the port site. You have fluid or blood coming from your port site. You have pus or a bad smell coming from the port site. You have a fever. Summary Implanted ports are usually placed in the chest for long-term IV access. Follow instructions from your health care provider about flushing the port and changing bandages (dressings). Take care of the area around your port by avoiding clothing that puts pressure on the area, and by watching for signs of infection. Protect the skin around your port from seat belts. Place a soft pad over your chest if needed. Get help right away if you have a fever or you have redness, swelling, pain, drainage, or a bad smell at the port site. This information is not intended to replace advice given to you by your health care provider. Make sure you discuss any questions you have with your health care provider. Document Revised: 08/19/2020 Document Reviewed: 10/14/2019 Elsevier Patient Education  2022 Elsevier Inc.  

## 2021-02-11 ENCOUNTER — Other Ambulatory Visit (HOSPITAL_BASED_OUTPATIENT_CLINIC_OR_DEPARTMENT_OTHER): Payer: Self-pay

## 2021-02-11 ENCOUNTER — Other Ambulatory Visit: Payer: Self-pay | Admitting: *Deleted

## 2021-02-11 ENCOUNTER — Encounter: Payer: Self-pay | Admitting: Hematology & Oncology

## 2021-02-11 ENCOUNTER — Encounter: Payer: Self-pay | Admitting: Family

## 2021-02-11 LAB — IRON AND TIBC
Iron: 44 ug/dL (ref 42–163)
Saturation Ratios: 17 % — ABNORMAL LOW (ref 20–55)
TIBC: 264 ug/dL (ref 202–409)
UIBC: 220 ug/dL (ref 117–376)

## 2021-02-11 LAB — FERRITIN: Ferritin: 346 ng/mL — ABNORMAL HIGH (ref 24–336)

## 2021-02-11 NOTE — Progress Notes (Signed)
Oncology Nurse Navigator Documentation  Oncology Nurse Navigator Flowsheets 02/10/2021  Abnormal Finding Date -  Confirmed Diagnosis Date -  Diagnosis Status -  Planned Course of Treatment -  Phase of Treatment -  Chemotherapy Pending- Reason: -  Chemo/Radiation Concurrent Actual Start Date: -  Navigator Follow Up Date: 03/12/2021  Navigator Follow Up Reason: Follow-up Appointment;Chemotherapy  Navigator Location [facility]  Referral Date to RadOnc/MedOnc -  Navigator Encounter Type Appt/Treatment Plan Review  Telephone -  Treatment Initiated Date -  Patient Visit Type MedOnc  Treatment Phase Active Tx  Barriers/Navigation Needs Coordination of Care;Education  Education -  Interventions Psycho-Social Support  Acuity Level 2-Minimal Needs (1-2 Barriers Identified)  Referrals -  Coordination of Care -  Education Method -  Support Groups/Services Friends and Family  Time Spent with Patient 15

## 2021-02-16 ENCOUNTER — Other Ambulatory Visit (HOSPITAL_BASED_OUTPATIENT_CLINIC_OR_DEPARTMENT_OTHER): Payer: Self-pay

## 2021-02-16 ENCOUNTER — Other Ambulatory Visit: Payer: Self-pay

## 2021-02-16 ENCOUNTER — Other Ambulatory Visit: Payer: Self-pay | Admitting: Hematology & Oncology

## 2021-02-16 DIAGNOSIS — C799 Secondary malignant neoplasm of unspecified site: Secondary | ICD-10-CM

## 2021-02-16 DIAGNOSIS — C679 Malignant neoplasm of bladder, unspecified: Secondary | ICD-10-CM

## 2021-02-16 MED ORDER — LORAZEPAM 0.5 MG PO TABS
1.0000 mg | ORAL_TABLET | Freq: Four times a day (QID) | ORAL | 1 refills | Status: AC | PRN
  Filled 2021-02-16: qty 120, 15d supply, fill #0
  Filled 2021-03-15: qty 120, 15d supply, fill #1

## 2021-02-17 ENCOUNTER — Other Ambulatory Visit: Payer: 59

## 2021-02-17 ENCOUNTER — Ambulatory Visit: Payer: 59

## 2021-02-18 ENCOUNTER — Inpatient Hospital Stay: Payer: 59

## 2021-02-18 ENCOUNTER — Inpatient Hospital Stay: Payer: 59 | Attending: Hematology & Oncology

## 2021-02-18 ENCOUNTER — Other Ambulatory Visit: Payer: Self-pay | Admitting: Family

## 2021-02-18 ENCOUNTER — Other Ambulatory Visit: Payer: Self-pay

## 2021-02-18 DIAGNOSIS — Z95828 Presence of other vascular implants and grafts: Secondary | ICD-10-CM

## 2021-02-18 DIAGNOSIS — C679 Malignant neoplasm of bladder, unspecified: Secondary | ICD-10-CM | POA: Diagnosis not present

## 2021-02-18 DIAGNOSIS — D649 Anemia, unspecified: Secondary | ICD-10-CM

## 2021-02-18 DIAGNOSIS — D509 Iron deficiency anemia, unspecified: Secondary | ICD-10-CM

## 2021-02-18 DIAGNOSIS — M25552 Pain in left hip: Secondary | ICD-10-CM | POA: Insufficient documentation

## 2021-02-18 DIAGNOSIS — C799 Secondary malignant neoplasm of unspecified site: Secondary | ICD-10-CM

## 2021-02-18 DIAGNOSIS — E860 Dehydration: Secondary | ICD-10-CM

## 2021-02-18 LAB — CMP (CANCER CENTER ONLY)
ALT: 22 U/L (ref 0–44)
AST: 15 U/L (ref 15–41)
Albumin: 3.6 g/dL (ref 3.5–5.0)
Alkaline Phosphatase: 54 U/L (ref 38–126)
Anion gap: 7 (ref 5–15)
BUN: 56 mg/dL — ABNORMAL HIGH (ref 8–23)
CO2: 26 mmol/L (ref 22–32)
Calcium: 9.2 mg/dL (ref 8.9–10.3)
Chloride: 99 mmol/L (ref 98–111)
Creatinine: 1.28 mg/dL — ABNORMAL HIGH (ref 0.61–1.24)
GFR, Estimated: >60 mL/min (ref >60)
Glucose, Bld: 190 mg/dL — ABNORMAL HIGH (ref 70–99)
Potassium: 5.2 mmol/L — ABNORMAL HIGH (ref 3.5–5.1)
Sodium: 132 mmol/L — ABNORMAL LOW (ref 135–145)
Total Bilirubin: 0.5 mg/dL (ref 0.3–1.2)
Total Protein: 5.7 g/dL — ABNORMAL LOW (ref 6.5–8.1)

## 2021-02-18 LAB — CBC WITH DIFFERENTIAL (CANCER CENTER ONLY)
Abs Immature Granulocytes: 1.57 10*3/uL — ABNORMAL HIGH (ref 0.00–0.07)
Basophils Absolute: 0 10*3/uL (ref 0.0–0.1)
Basophils Relative: 0 %
Eosinophils Absolute: 0 10*3/uL (ref 0.0–0.5)
Eosinophils Relative: 0 %
HCT: 25.7 % — ABNORMAL LOW (ref 39.0–52.0)
Hemoglobin: 8.3 g/dL — ABNORMAL LOW (ref 13.0–17.0)
Immature Granulocytes: 7 %
Lymphocytes Relative: 2 %
Lymphs Abs: 0.3 10*3/uL — ABNORMAL LOW (ref 0.7–4.0)
MCH: 31.4 pg (ref 26.0–34.0)
MCHC: 32.3 g/dL (ref 30.0–36.0)
MCV: 97.3 fL (ref 80.0–100.0)
Monocytes Absolute: 0.4 10*3/uL (ref 0.1–1.0)
Monocytes Relative: 2 %
Neutro Abs: 19.4 10*3/uL — ABNORMAL HIGH (ref 1.7–7.7)
Neutrophils Relative %: 89 %
Platelet Count: 109 10*3/uL — ABNORMAL LOW (ref 150–400)
RBC: 2.64 MIL/uL — ABNORMAL LOW (ref 4.22–5.81)
RDW: 23.4 % — ABNORMAL HIGH (ref 11.5–15.5)
WBC Count: 21.7 10*3/uL — ABNORMAL HIGH (ref 4.0–10.5)
nRBC: 0.7 % — ABNORMAL HIGH (ref 0.0–0.2)

## 2021-02-18 LAB — PREPARE RBC (CROSSMATCH)

## 2021-02-18 LAB — SAMPLE TO BLOOD BANK

## 2021-02-18 MED ORDER — SODIUM CHLORIDE 0.9 % IV SOLN
1000.0000 mL | INTRAVENOUS | Status: AC
  Administered 2021-02-18: 1000 mL via INTRAVENOUS

## 2021-02-18 MED ORDER — SODIUM CHLORIDE 0.9% FLUSH: 10.0000 mL | Freq: Once | INTRAVENOUS | Status: DC

## 2021-02-18 MED ORDER — HEPARIN SOD (PORK) LOCK FLUSH 100 UNIT/ML IV SOLN: 500.0000 [IU] | Freq: Once | INTRAVENOUS | Status: DC

## 2021-02-18 NOTE — Progress Notes (Signed)
Labs reviewedm by MD, no treatment,pt will get 2 units RBC. Appt pending.

## 2021-02-18 NOTE — Patient Instructions (Signed)

## 2021-02-19 ENCOUNTER — Inpatient Hospital Stay: Payer: 59

## 2021-02-19 DIAGNOSIS — C679 Malignant neoplasm of bladder, unspecified: Secondary | ICD-10-CM | POA: Diagnosis not present

## 2021-02-19 DIAGNOSIS — C799 Secondary malignant neoplasm of unspecified site: Secondary | ICD-10-CM

## 2021-02-19 DIAGNOSIS — M7989 Other specified soft tissue disorders: Secondary | ICD-10-CM

## 2021-02-19 DIAGNOSIS — D649 Anemia, unspecified: Secondary | ICD-10-CM

## 2021-02-19 MED ORDER — HEPARIN SOD (PORK) LOCK FLUSH 100 UNIT/ML IV SOLN
500.0000 [IU] | Freq: Every day | INTRAVENOUS | Status: AC | PRN
  Administered 2021-02-19: 500 [IU]

## 2021-02-19 MED ORDER — SODIUM CHLORIDE 0.9% FLUSH
10.0000 mL | INTRAVENOUS | Status: AC | PRN
  Administered 2021-02-19: 10 mL

## 2021-02-19 NOTE — Patient Instructions (Signed)
Blood Transfusion, Adult A blood transfusion is a procedure in which you receive blood or a type of blood cell (blood component) through an IV. You may need a blood transfusion when your blood level is low. This may result from a bleeding disorder, illness, injury, or surgery. The blood may come from a donor. You may also be able to donate blood for yourself (autologous blood donation) before a planned surgery. The blood given in a transfusion is made up of different blood components. You may receive: Red blood cells. These carry oxygen to the cells in the body. Platelets. These help your blood to clot. Plasma. This is the liquid part of your blood. It carries proteins and other substances throughout the body. White blood cells. These help you fight infections. If you have hemophilia or another clotting disorder, you may also receive other types of blood products. Tell a health care provider about: Any blood disorders you have. Any previous reactions you have had during a blood transfusion. Any allergies you have. All medicines you are taking, including vitamins, herbs, eye drops, creams, and over-the-counter medicines. Any surgeries you have had. Any medical conditions you have, including any recent fever or cold symptoms. Whether you are pregnant or may be pregnant. What are the risks? Generally, this is a safe procedure. However, problems may occur. The most common problems include: A mild allergic reaction, such as red, swollen areas of skin (hives) and itching. Fever or chills. This may be the body's response to new blood cells received. This may occur during or up to 4 hours after the transfusion. More serious problems may include: Transfusion-associated circulatory overload (TACO), or too much fluid in the lungs. This may cause breathing problems. A serious allergic reaction, such as difficulty breathing or swelling around the face and lips. Transfusion-related acute lung injury  (TRALI), which causes breathing difficulty and low oxygen in the blood. This can occur within hours of the transfusion or several days later. Iron overload. This can happen after receiving many blood transfusions over a period of time. Infection or virus being transmitted. This is rare because donated blood is carefully tested before it is given. Hemolytic transfusion reaction. This is rare. It happens when your body's defense system (immune system)tries to attack the new blood cells. Symptoms may include fever, chills, nausea, low blood pressure, and low back or chest pain. Transfusion-associated graft-versus-host disease (TAGVHD). This is rare. It happens when donated cells attack your body's healthy tissues. What happens before the procedure? Medicines Ask your health care provider about: Changing or stopping your regular medicines. This is especially important if you are taking diabetes medicines or blood thinners. Taking medicines such as aspirin and ibuprofen. These medicines can thin your blood. Do not take these medicines unless your health care provider tells you to take them. Taking over-the-counter medicines, vitamins, herbs, and supplements. General instructions Follow instructions from your health care provider about eating and drinking restrictions. You will have a blood test to determine your blood type. This is necessary to know what kind of blood your body will accept and to match it to the donor blood. If you are going to have a planned surgery, you may be able to do an autologous blood donation. This may be done in case you need to have a transfusion. You will have your temperature, blood pressure, and pulse monitored before the transfusion. If you have had an allergic reaction to a transfusion in the past, you may be given medicine to help prevent   a reaction. This medicine may be given to you by mouth (orally) or through an IV. Set aside time for the blood transfusion. This  procedure generally takes 1-4 hours to complete. What happens during the procedure?  An IV will be inserted into one of your veins. The bag of donated blood will be attached to your IV. The blood will then enter through your vein. Your temperature, blood pressure, and pulse will be monitored regularly during the transfusion. This monitoring is done to detect early signs of a transfusion reaction. Tell your nurse right away if you have any of these symptoms during the transfusion: Shortness of breath or trouble breathing. Chest or back pain. Fever or chills. Hives or itching. If you have any signs or symptoms of a reaction, your transfusion will be stopped and you may be given medicine. When the transfusion is complete, your IV will be removed. Pressure may be applied to the IV site for a few minutes. A bandage (dressing)will be applied. The procedure may vary among health care providers and hospitals. What happens after the procedure? Your temperature, blood pressure, pulse, breathing rate, and blood oxygen level will be monitored until you leave the hospital or clinic. Your blood may be tested to see how you are responding to the transfusion. You may be warmed with fluids or blankets to maintain a normal body temperature. If you receive your blood transfusion in an outpatient setting, you will be told whom to contact to report any reactions. Where to find more information For more information on blood transfusions, visit the [employer]: redcross.org Summary A blood transfusion is a procedure in which you receive blood or a type of blood cell (blood component) through an IV. The blood you receive may come from a donor or be donated by yourself (autologous blood donation) before a planned surgery. The blood given in a transfusion is made up of different blood components. You may receive red blood cells, platelets, plasma, or white blood cells depending on the condition treated. Your  temperature, blood pressure, and pulse will be monitored before, during, and after the transfusion. After the transfusion, your blood may be tested to see how your body has responded. This information is not intended to replace advice given to you by your health care provider. Make sure you discuss any questions you have with your health care provider. Document Revised: 04/04/2019 Document Reviewed: 11/22/2018 Elsevier Patient Education  2022 Elsevier Inc.  

## 2021-02-21 IMAGING — MR MR LUMBAR SPINE WO/W CM
5 of 7 series · 30 of 48 positions shown · IV contrast (gadavist)
Comparison: 05/15/2020

CLINICAL DATA: Urologic cancer.  It is S treatment response.

EXAM:
MRI LUMBAR SPINE WITHOUT AND WITH CONTRAST
TECHNIQUE: Multiplanar and multiecho pulse sequences of the lumbar spine were
obtained without and with intravenous contrast.
CONTRAST:  5mL GADAVIST GADOBUTROL 1 MMOL/ML IV SOLN

[Series 5: T1 · sagittal · 4.0mm · 0.81mm/px · 3 of 17 slices shown (1 of 2)]
[im 1/17]
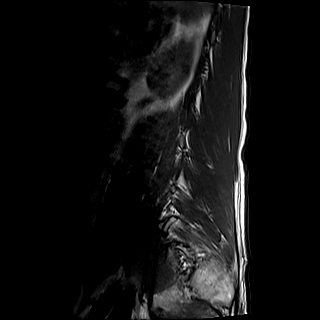
[im 9/17]
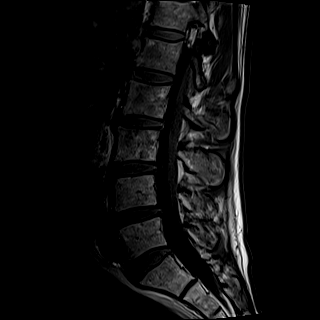
[im 17/17]
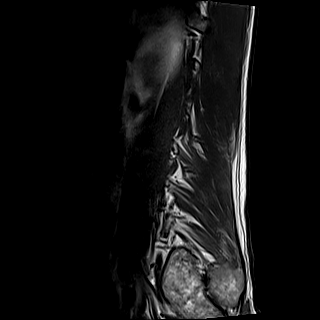

[Series 7: T2 · axial · 4.0mm · 0.62mm/px · z∈[-57,+179]mm · 11 of 45 slices shown]
[im 1/45]
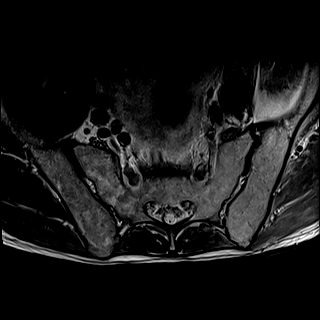
[im 5/45]
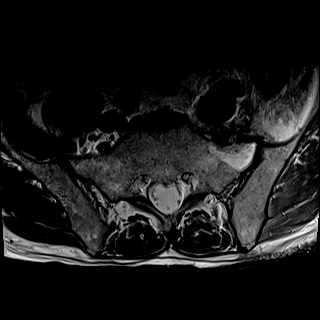
[im 9/45]
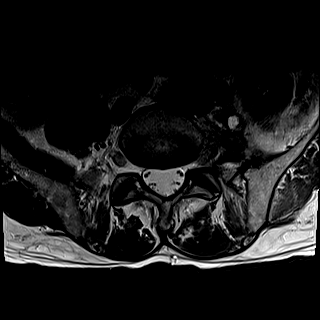
[im 14/45]
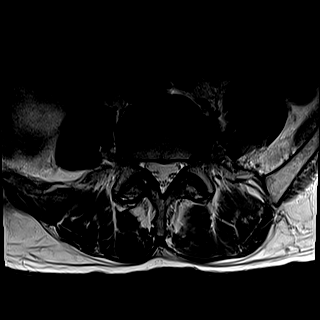
[im 18/45]
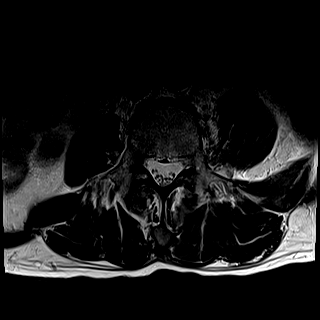
[im 23/45]
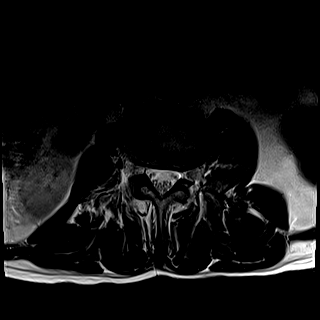
[im 27/45]
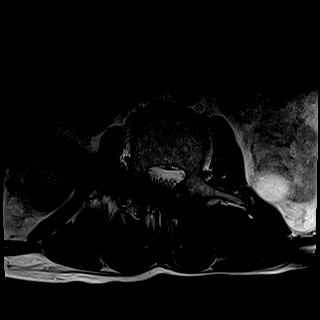
[im 31/45]
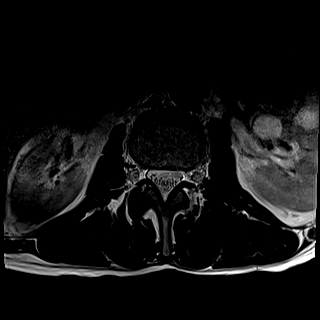
[im 36/45]
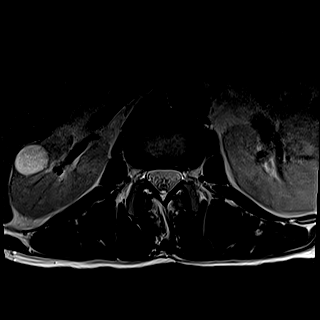
[im 40/45]
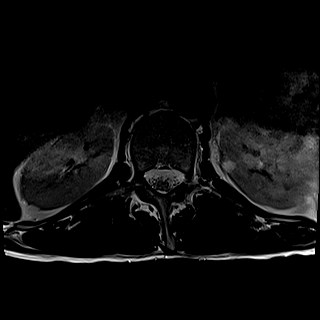
[im 45/45]
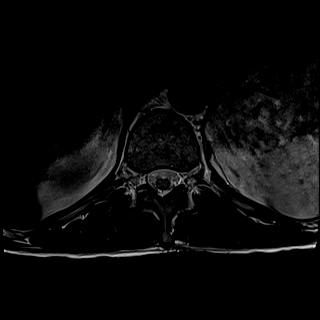

[Series 8: T1 · axial · 4.0mm · 0.39mm/px · z∈[-57,+179]mm · 11 of 45 slices shown (2 of 2)]
[im 1/45]
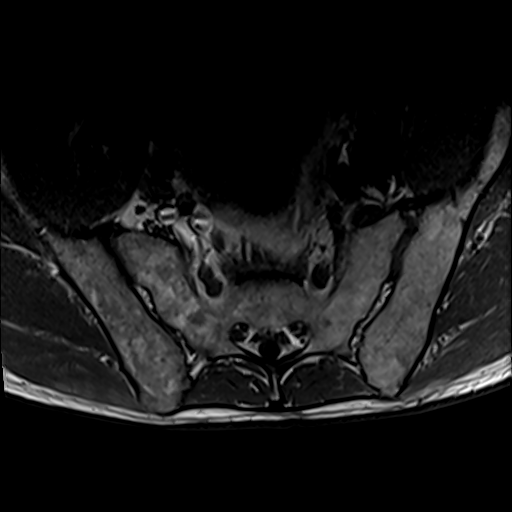
[im 5/45]
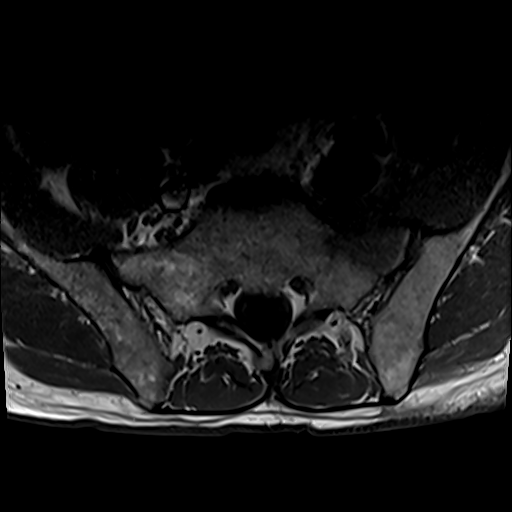
[im 9/45]
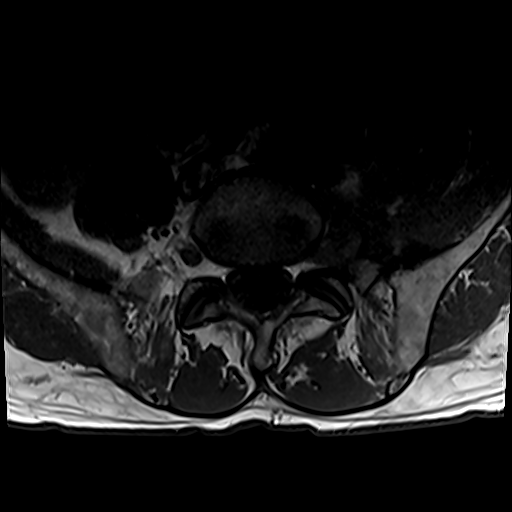
[im 14/45]
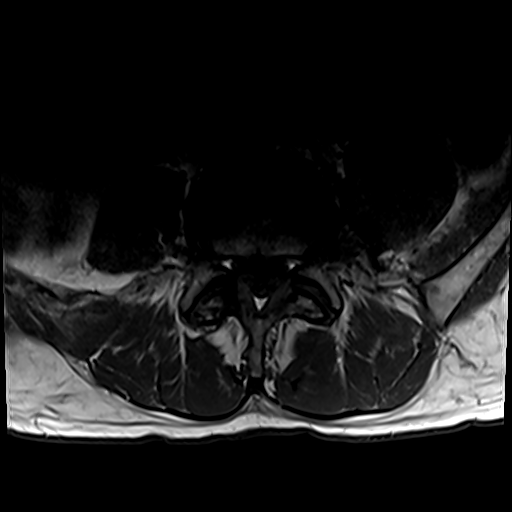
[im 18/45]
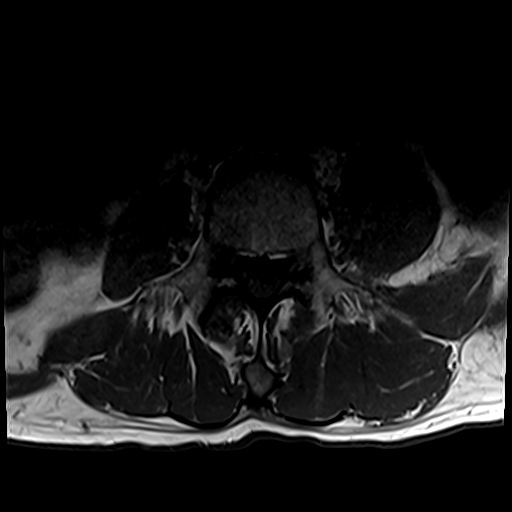
[im 23/45]
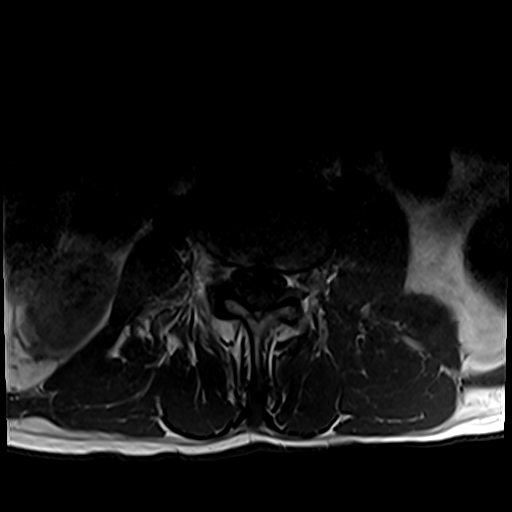
[im 27/45]
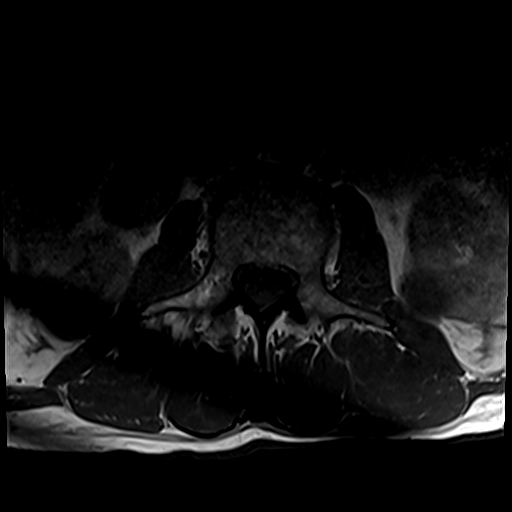
[im 31/45]
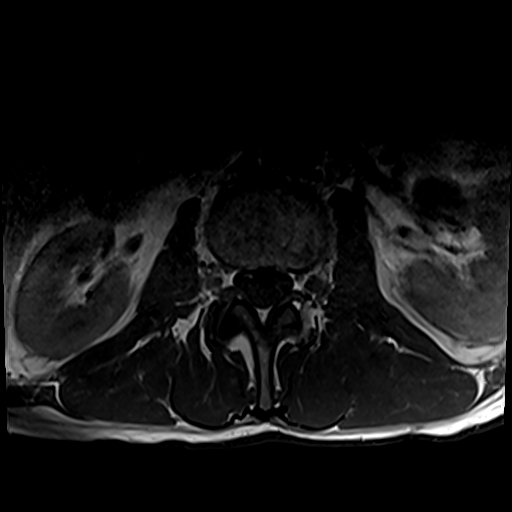
[im 36/45]
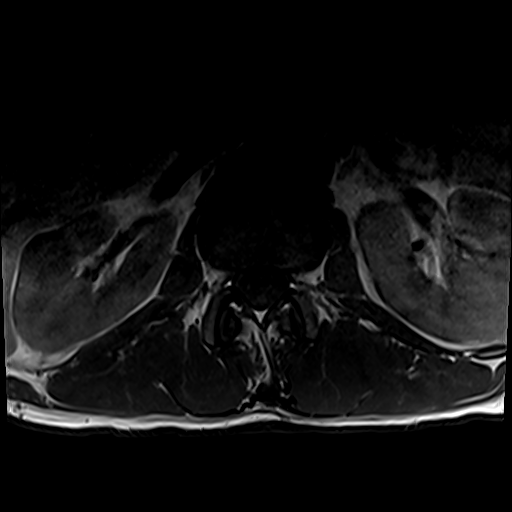
[im 40/45]
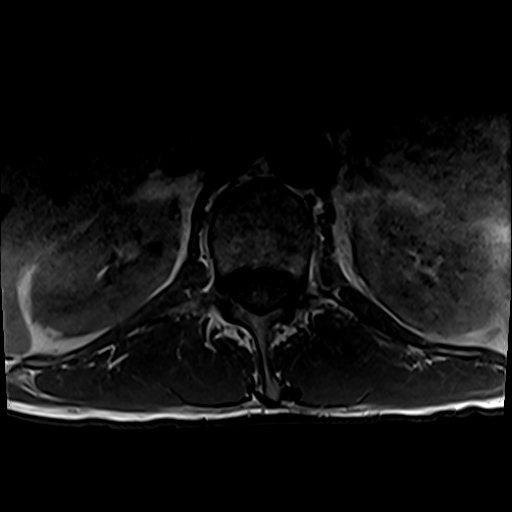
[im 45/45]
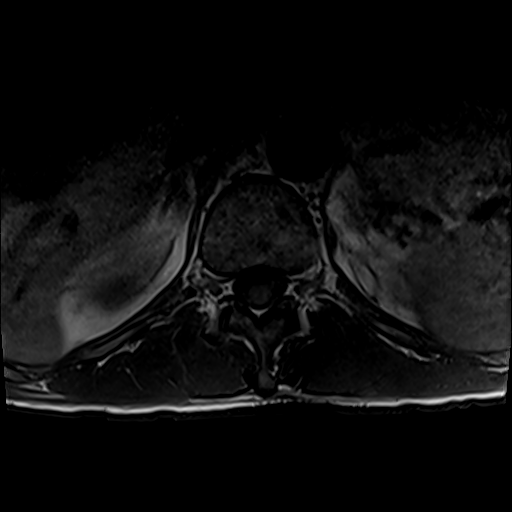

[Series 9: T2 post-contrast · sagittal · 4.0mm · 0.81mm/px · 4 of 17 slices shown]
[im 1/17]
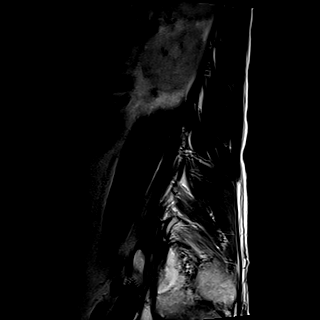
[im 6/17]
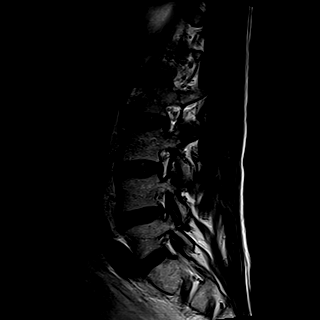
[im 11/17]
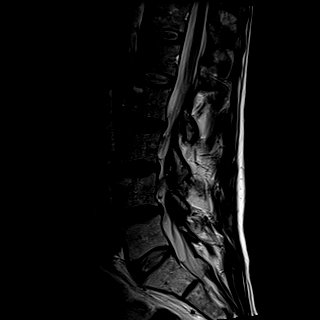
[im 17/17]
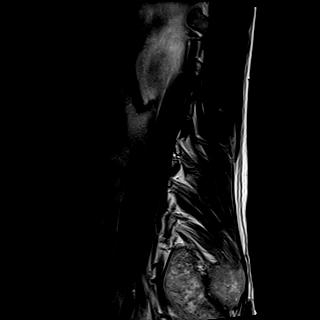

[Series 10: T1 fat-sat post-contrast · sagittal · 4.0mm · 0.81mm/px · 1 of 17 slices shown]
[im 1/17]
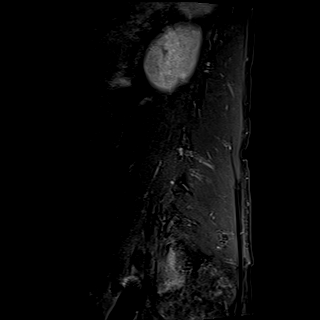

[30 of 48 positions shown; findings below may reference images not displayed]

FINDINGS: Segmentation:  5 lumbar type vertebrae

Alignment:  Normal

Vertebrae: Chronic metastatic deposit along the left retroperitoneum
involving the iliopsoas, lumbar plexus, and L5/S1 vertebrae. Mild
reduction in vertebral involvement by precontrast T1 weighted
imaging. There is still enhancing tissue along the upper lumbar
plexus, especially the thickened left L5 nerve root. Patchy cystic
components to the mass are again noted. Iliopsoas expansion on the
left which is similar to prior. T2 hyperintensity in the dependent
left iliacus may be from denervation changes.

Conus medullaris and cauda equina: Conus extends to the L1-2 level.
Conus and cauda equina appear normal.

Paraspinal and other soft tissues: As above

Disc levels:

Mild disc bulging at L2-3 to L4-5. Facet osteoarthritis with
asymmetric right-sided spurring.
IMPRESSION: Mild regression of metastatic disease centered in the left
retroperitoneum and involving the lumbosacral junction, upper lumbar
plexus, and iliopsoas musculature. Viable tumor may remain,
attention on pending PET-CT.

## 2021-02-22 ENCOUNTER — Other Ambulatory Visit (HOSPITAL_BASED_OUTPATIENT_CLINIC_OR_DEPARTMENT_OTHER): Payer: Self-pay

## 2021-02-22 ENCOUNTER — Other Ambulatory Visit: Payer: Self-pay | Admitting: Hematology & Oncology

## 2021-02-22 LAB — BPAM RBC
Blood Product Expiration Date: 202210072359
Blood Product Expiration Date: 202210072359
ISSUE DATE / TIME: 202209090811
ISSUE DATE / TIME: 202209090811
Unit Type and Rh: 6200
Unit Type and Rh: 6200

## 2021-02-22 LAB — TYPE AND SCREEN
ABO/RH(D): A POS
Antibody Screen: NEGATIVE
Unit division: 0
Unit division: 0

## 2021-02-22 MED ORDER — MELOXICAM 15 MG PO TABS
ORAL_TABLET | Freq: Every day | ORAL | 3 refills | Status: DC
  Filled 2021-02-22: qty 30, 30d supply, fill #0

## 2021-02-22 MED ORDER — OXYCODONE HCL 10 MG PO TABS
ORAL_TABLET | ORAL | 0 refills | Status: DC | PRN
  Filled 2021-02-22: qty 90, 15d supply, fill #0

## 2021-02-23 ENCOUNTER — Other Ambulatory Visit: Payer: Self-pay

## 2021-02-23 DIAGNOSIS — C799 Secondary malignant neoplasm of unspecified site: Secondary | ICD-10-CM

## 2021-02-23 DIAGNOSIS — C679 Malignant neoplasm of bladder, unspecified: Secondary | ICD-10-CM

## 2021-02-24 ENCOUNTER — Inpatient Hospital Stay: Payer: 59

## 2021-02-24 ENCOUNTER — Ambulatory Visit: Payer: 59 | Admitting: Physical Therapy

## 2021-02-24 ENCOUNTER — Other Ambulatory Visit: Payer: Self-pay

## 2021-02-24 DIAGNOSIS — C799 Secondary malignant neoplasm of unspecified site: Secondary | ICD-10-CM

## 2021-02-24 DIAGNOSIS — I82220 Acute embolism and thrombosis of inferior vena cava: Secondary | ICD-10-CM | POA: Diagnosis not present

## 2021-02-24 DIAGNOSIS — M549 Dorsalgia, unspecified: Secondary | ICD-10-CM | POA: Diagnosis not present

## 2021-02-24 LAB — CMP (CANCER CENTER ONLY)
ALT: 19 U/L (ref 0–44)
AST: 12 U/L — ABNORMAL LOW (ref 15–41)
Albumin: 3.7 g/dL (ref 3.5–5.0)
Alkaline Phosphatase: 57 U/L (ref 38–126)
Anion gap: 6 (ref 5–15)
BUN: 40 mg/dL — ABNORMAL HIGH (ref 8–23)
CO2: 28 mmol/L (ref 22–32)
Calcium: 9.4 mg/dL (ref 8.9–10.3)
Chloride: 99 mmol/L (ref 98–111)
Creatinine: 1.01 mg/dL (ref 0.61–1.24)
GFR, Estimated: >60 mL/min (ref >60)
Glucose, Bld: 101 mg/dL — ABNORMAL HIGH (ref 70–99)
Potassium: 4.3 mmol/L (ref 3.5–5.1)
Sodium: 133 mmol/L — ABNORMAL LOW (ref 135–145)
Total Bilirubin: 0.7 mg/dL (ref 0.3–1.2)
Total Protein: 5.7 g/dL — ABNORMAL LOW (ref 6.5–8.1)

## 2021-02-24 LAB — CBC WITH DIFFERENTIAL (CANCER CENTER ONLY)
Abs Immature Granulocytes: 0.23 10*3/uL — ABNORMAL HIGH (ref 0.00–0.07)
Basophils Absolute: 0 10*3/uL (ref 0.0–0.1)
Basophils Relative: 0 %
Eosinophils Absolute: 0.2 10*3/uL (ref 0.0–0.5)
Eosinophils Relative: 1 %
HCT: 30.8 % — ABNORMAL LOW (ref 39.0–52.0)
Hemoglobin: 10.1 g/dL — ABNORMAL LOW (ref 13.0–17.0)
Immature Granulocytes: 1 %
Lymphocytes Relative: 5 %
Lymphs Abs: 1 10*3/uL (ref 0.7–4.0)
MCH: 31.5 pg (ref 26.0–34.0)
MCHC: 32.8 g/dL (ref 30.0–36.0)
MCV: 96 fL (ref 80.0–100.0)
Monocytes Absolute: 0.7 10*3/uL (ref 0.1–1.0)
Monocytes Relative: 3 %
Neutro Abs: 18.7 10*3/uL — ABNORMAL HIGH (ref 1.7–7.7)
Neutrophils Relative %: 90 %
Platelet Count: 73 10*3/uL — ABNORMAL LOW (ref 150–400)
RBC: 3.21 MIL/uL — ABNORMAL LOW (ref 4.22–5.81)
RDW: 21.7 % — ABNORMAL HIGH (ref 11.5–15.5)
WBC Count: 20.8 10*3/uL — ABNORMAL HIGH (ref 4.0–10.5)
nRBC: 0 % (ref 0.0–0.2)

## 2021-02-24 MED ORDER — SODIUM CHLORIDE 0.9% FLUSH
10.0000 mL | INTRAVENOUS | Status: DC | PRN
  Administered 2021-02-24: 10 mL

## 2021-02-24 MED ORDER — KETOROLAC TROMETHAMINE 30 MG/ML IJ SOLN
30.0000 mg | Freq: Once | INTRAMUSCULAR | Status: AC
  Administered 2021-02-24: 30 mg via INTRAVENOUS
  Filled 2021-02-24: qty 1

## 2021-02-24 MED ORDER — SODIUM CHLORIDE 0.9 % IV SOLN: Freq: Once | INTRAVENOUS | Status: DC

## 2021-02-24 MED ORDER — SODIUM CHLORIDE 0.9 % IV SOLN: 1.2500 mg/kg | Freq: Once | INTRAVENOUS | Status: DC

## 2021-02-24 MED ORDER — HEPARIN SOD (PORK) LOCK FLUSH 100 UNIT/ML IV SOLN
500.0000 [IU] | Freq: Once | INTRAVENOUS | Status: AC | PRN
  Administered 2021-02-24: 500 [IU]

## 2021-02-24 MED ORDER — SODIUM CHLORIDE 0.9 % IV SOLN: 10.0000 mg | Freq: Once | INTRAVENOUS | Status: DC

## 2021-02-24 MED ORDER — SODIUM CHLORIDE 0.9 % IV SOLN: Freq: Once | INTRAVENOUS | Status: AC

## 2021-02-24 MED ORDER — PALONOSETRON HCL INJECTION 0.25 MG/5ML: 0.2500 mg | Freq: Once | INTRAVENOUS | Status: DC

## 2021-02-24 NOTE — Patient Instructions (Signed)
Implanted Port Home Guide An implanted port is a device that is placed under the skin. It is usually placed in the chest. The device can be used to give IV medicine, to take blood, or for dialysis. You may have an implanted port if: You need IV medicine that would be irritating to the small veins in your hands or arms. You need IV medicines, such as antibiotics, for a long period of time. You need IV nutrition for a long period of time. You need dialysis. When you have a port, your health care provider can choose to use the port instead of veins in your arms for these procedures. You may have fewer limitations when using a port than you would if you used other types of long-term IVs, and you will likely be able to return to normal activities after your incision heals. An implanted port has two main parts: Reservoir. The reservoir is the part where a needle is inserted to give medicines or draw blood. The reservoir is round. After it is placed, it appears as a small, raised area under your skin. Catheter. The catheter is a thin, flexible tube that connects the reservoir to a vein. Medicine that is inserted into the reservoir goes into the catheter and then into the vein. How is my port accessed? To access your port: A numbing cream may be placed on the skin over the port site. Your health care provider will put on a mask and sterile gloves. The skin over your port will be cleaned carefully with a germ-killing soap and allowed to dry. Your health care provider will gently pinch the port and insert a needle into it. Your health care provider will check for a blood return to make sure the port is in the vein and is not clogged. If your port needs to remain accessed to get medicine continuously (constant infusion), your health care provider will place a clear bandage (dressing) over the needle site. The dressing and needle will need to be changed every week, or as told by your health care provider. What  is flushing? Flushing helps keep the port from getting clogged. Follow instructions from your health care provider about how and when to flush the port. Ports are usually flushed with saline solution or a medicine called heparin. The need for flushing will depend on how the port is used: If the port is only used from time to time to give medicines or draw blood, the port may need to be flushed: Before and after medicines have been given. Before and after blood has been drawn. As part of routine maintenance. Flushing may be recommended every 4-6 weeks. If a constant infusion is running, the port may not need to be flushed. Throw away any syringes in a disposal container that is meant for sharp items (sharps container). You can buy a sharps container from a pharmacy, or you can make one by using an empty hard plastic bottle with a cover. How long will my port stay implanted? The port can stay in for as long as your health care provider thinks it is needed. When it is time for the port to come out, a surgery will be done to remove it. The surgery will be similar to the procedure that was done to put the port in. Follow these instructions at home:  Flush your port as told by your health care provider. If you need an infusion over several days, follow instructions from your health care provider about how   to take care of your port site. Make sure you: Wash your hands with soap and water before you change your dressing. If soap and water are not available, use alcohol-based hand sanitizer. Change your dressing as told by your health care provider. Place any used dressings or infusion bags into a plastic bag. Throw that bag in the trash. Keep the dressing that covers the needle clean and dry. Do not get it wet. Do not use scissors or sharp objects near the tube. Keep the tube clamped, unless it is being used. Check your port site every day for signs of infection. Check for: Redness, swelling, or  pain. Fluid or blood. Pus or a bad smell. Protect the skin around the port site. Avoid wearing bra straps that rub or irritate the site. Protect the skin around your port from seat belts. Place a soft pad over your chest if needed. Bathe or shower as told by your health care provider. The site may get wet as long as you are not actively receiving an infusion. Return to your normal activities as told by your health care provider. Ask your health care provider what activities are safe for you. Carry a medical alert card or wear a medical alert bracelet at all times. This will let health care providers know that you have an implanted port in case of an emergency. Get help right away if: You have redness, swelling, or pain at the port site. You have fluid or blood coming from your port site. You have pus or a bad smell coming from the port site. You have a fever. Summary Implanted ports are usually placed in the chest for long-term IV access. Follow instructions from your health care provider about flushing the port and changing bandages (dressings). Take care of the area around your port by avoiding clothing that puts pressure on the area, and by watching for signs of infection. Protect the skin around your port from seat belts. Place a soft pad over your chest if needed. Get help right away if you have a fever or you have redness, swelling, pain, drainage, or a bad smell at the port site. This information is not intended to replace advice given to you by your health care provider. Make sure you discuss any questions you have with your health care provider. Document Revised: 08/19/2020 Document Reviewed: 10/14/2019 Elsevier Patient Education  2022 Elsevier Inc.  

## 2021-02-24 NOTE — Patient Instructions (Signed)
Thrombocytopenia Thrombocytopenia means that you have a low number of platelets in your blood. Platelets are tiny cells in the blood. When you bleed, they clump together at the cut or injury to stop the bleeding. This is called blood clotting. If you do not have enough platelets, it can cause bleeding problems. Some cases of this condition are mild while others are more severe. What are the causes? This condition may be caused by: Your body not making enough platelets. This may be caused by: Your bone marrow not making blood cells (aplastic anemia). Cancer in the bone marrow. Certain medicines. Infection in the bone marrow. Drinking a lot of alcohol. Your body destroying platelets too quickly. This may be caused by: Certain immune diseases. Certain medicines. Certain blood clotting disorders. Certain disorders that are passed from parent to child (inherited). Certain bleeding disorders. Pregnancy. Having a spleen that is larger than normal. What are the signs or symptoms? Bleeding that is not normal. Nosebleeds. Heavy menstrual periods. Blood in the pee (urine) or poop (stool). A purple-like color to the skin (purpura). Bruising. A rash that looks like pinpoint, purple-red spots (petechiae). How is this treated? Treatment of another condition that is causing the low platelet count. Medicines to help protect your platelets from being destroyed. A replacement (transfusion) of platelets to stop or prevent bleeding. Surgery to remove the spleen. Follow these instructions at home: Activity Avoid activities that could cause you to get hurt or bruised. Follow instructions about how to prevent falls. Take care not to cut yourself: When you shave. When you use scissors, needles, knives, or other tools. Take care not to burn yourself: When you use an iron. When you cook. General instructions  Check your skin and the inside of your mouth for bruises or blood as told by your  doctor. Check to see if there is blood in your spit (sputum), pee, and poop. Do this as told by your doctor. Do not drink alcohol. Take over-the-counter and prescription medicines only as told by your doctor. Do not take any medicines that have aspirin or NSAIDs in them. These medicines can thin your blood and cause you to bleed. Tell all of your doctors that you have this condition. Be sure to tell your dentist and eye doctor too. Contact a doctor if: You have bruises and you do not know why. Get help right away if: You are bleeding anywhere on your body. You have blood in your spit, pee, or poop. Summary Thrombocytopenia means that you have a low number of platelets in your blood. Platelets are needed for blood clotting. Symptoms of this condition include bleeding that is not normal, and bruising. Take care not to cut or burn yourself. This information is not intended to replace advice given to you by your health care provider. Make sure you discuss any questions you have with your health care provider. Document Revised: 09/08/2020 Document Reviewed: 03/01/2018 Elsevier Patient Education  [place].

## 2021-02-25 ENCOUNTER — Emergency Department (HOSPITAL_COMMUNITY): Payer: 59

## 2021-02-25 ENCOUNTER — Encounter (HOSPITAL_COMMUNITY): Payer: Self-pay

## 2021-02-25 ENCOUNTER — Other Ambulatory Visit: Payer: Self-pay

## 2021-02-25 ENCOUNTER — Telehealth: Payer: Self-pay | Admitting: *Deleted

## 2021-02-25 ENCOUNTER — Inpatient Hospital Stay (HOSPITAL_COMMUNITY)
Admission: EM | Admit: 2021-02-25 | Discharge: 2021-03-08 | DRG: 294 | Disposition: A | Discharge status: Hospice | Payer: 59 | Attending: Internal Medicine | Admitting: Internal Medicine

## 2021-02-25 ENCOUNTER — Emergency Department (HOSPITAL_COMMUNITY)
Admit: 2021-02-25 | Discharge: 2021-02-25 | Disposition: A | Discharge status: Home / Self Care | Payer: 59 | Attending: Emergency Medicine | Admitting: Emergency Medicine

## 2021-02-25 DIAGNOSIS — L89313 Pressure ulcer of right buttock, stage 3: Secondary | ICD-10-CM | POA: Diagnosis present

## 2021-02-25 DIAGNOSIS — L8990 Pressure ulcer of unspecified site, unspecified stage: Secondary | ICD-10-CM | POA: Insufficient documentation

## 2021-02-25 DIAGNOSIS — I82412 Acute embolism and thrombosis of left femoral vein: Secondary | ICD-10-CM | POA: Diagnosis not present

## 2021-02-25 DIAGNOSIS — Z791 Long term (current) use of non-steroidal anti-inflammatories (NSAID): Secondary | ICD-10-CM

## 2021-02-25 DIAGNOSIS — R0600 Dyspnea, unspecified: Secondary | ICD-10-CM

## 2021-02-25 DIAGNOSIS — I82220 Acute embolism and thrombosis of inferior vena cava: Principal | ICD-10-CM | POA: Diagnosis present

## 2021-02-25 DIAGNOSIS — I82462 Acute embolism and thrombosis of left calf muscular vein: Secondary | ICD-10-CM | POA: Diagnosis present

## 2021-02-25 DIAGNOSIS — D72829 Elevated white blood cell count, unspecified: Secondary | ICD-10-CM | POA: Diagnosis present

## 2021-02-25 DIAGNOSIS — Z79899 Other long term (current) drug therapy: Secondary | ICD-10-CM

## 2021-02-25 DIAGNOSIS — C799 Secondary malignant neoplasm of unspecified site: Secondary | ICD-10-CM

## 2021-02-25 DIAGNOSIS — Y713 Surgical instruments, materials and cardiovascular devices (including sutures) associated with adverse incidents: Secondary | ICD-10-CM | POA: Diagnosis present

## 2021-02-25 DIAGNOSIS — I82452 Acute embolism and thrombosis of left peroneal vein: Secondary | ICD-10-CM | POA: Diagnosis present

## 2021-02-25 DIAGNOSIS — Z515 Encounter for palliative care: Secondary | ICD-10-CM

## 2021-02-25 DIAGNOSIS — D696 Thrombocytopenia, unspecified: Secondary | ICD-10-CM | POA: Diagnosis present

## 2021-02-25 DIAGNOSIS — G893 Neoplasm related pain (acute) (chronic): Secondary | ICD-10-CM | POA: Diagnosis present

## 2021-02-25 DIAGNOSIS — Z86718 Personal history of other venous thrombosis and embolism: Secondary | ICD-10-CM

## 2021-02-25 DIAGNOSIS — Z7189 Other specified counseling: Secondary | ICD-10-CM

## 2021-02-25 DIAGNOSIS — I82422 Acute embolism and thrombosis of left iliac vein: Secondary | ICD-10-CM | POA: Diagnosis not present

## 2021-02-25 DIAGNOSIS — E860 Dehydration: Secondary | ICD-10-CM | POA: Diagnosis not present

## 2021-02-25 DIAGNOSIS — T402X5A Adverse effect of other opioids, initial encounter: Secondary | ICD-10-CM | POA: Diagnosis not present

## 2021-02-25 DIAGNOSIS — I10 Essential (primary) hypertension: Secondary | ICD-10-CM | POA: Diagnosis present

## 2021-02-25 DIAGNOSIS — K219 Gastro-esophageal reflux disease without esophagitis: Secondary | ICD-10-CM | POA: Diagnosis present

## 2021-02-25 DIAGNOSIS — Z806 Family history of leukemia: Secondary | ICD-10-CM

## 2021-02-25 DIAGNOSIS — E782 Mixed hyperlipidemia: Secondary | ICD-10-CM | POA: Diagnosis present

## 2021-02-25 DIAGNOSIS — Z833 Family history of diabetes mellitus: Secondary | ICD-10-CM

## 2021-02-25 DIAGNOSIS — I82432 Acute embolism and thrombosis of left popliteal vein: Secondary | ICD-10-CM | POA: Diagnosis present

## 2021-02-25 DIAGNOSIS — M25552 Pain in left hip: Secondary | ICD-10-CM | POA: Diagnosis present

## 2021-02-25 DIAGNOSIS — Z7982 Long term (current) use of aspirin: Secondary | ICD-10-CM

## 2021-02-25 DIAGNOSIS — K5903 Drug induced constipation: Secondary | ICD-10-CM | POA: Diagnosis not present

## 2021-02-25 DIAGNOSIS — E441 Mild protein-calorie malnutrition: Secondary | ICD-10-CM | POA: Diagnosis present

## 2021-02-25 DIAGNOSIS — K649 Unspecified hemorrhoids: Secondary | ICD-10-CM | POA: Diagnosis not present

## 2021-02-25 DIAGNOSIS — Z7901 Long term (current) use of anticoagulants: Secondary | ICD-10-CM

## 2021-02-25 DIAGNOSIS — D638 Anemia in other chronic diseases classified elsewhere: Secondary | ICD-10-CM | POA: Diagnosis present

## 2021-02-25 DIAGNOSIS — I824Y2 Acute embolism and thrombosis of unspecified deep veins of left proximal lower extremity: Secondary | ICD-10-CM

## 2021-02-25 DIAGNOSIS — K5900 Constipation, unspecified: Secondary | ICD-10-CM | POA: Diagnosis present

## 2021-02-25 DIAGNOSIS — G8929 Other chronic pain: Secondary | ICD-10-CM

## 2021-02-25 DIAGNOSIS — M549 Dorsalgia, unspecified: Secondary | ICD-10-CM

## 2021-02-25 DIAGNOSIS — Z8551 Personal history of malignant neoplasm of bladder: Secondary | ICD-10-CM

## 2021-02-25 DIAGNOSIS — C786 Secondary malignant neoplasm of retroperitoneum and peritoneum: Secondary | ICD-10-CM | POA: Diagnosis present

## 2021-02-25 DIAGNOSIS — D509 Iron deficiency anemia, unspecified: Secondary | ICD-10-CM | POA: Diagnosis present

## 2021-02-25 DIAGNOSIS — C679 Malignant neoplasm of bladder, unspecified: Secondary | ICD-10-CM | POA: Diagnosis present

## 2021-02-25 DIAGNOSIS — D500 Iron deficiency anemia secondary to blood loss (chronic): Secondary | ICD-10-CM

## 2021-02-25 DIAGNOSIS — C7951 Secondary malignant neoplasm of bone: Secondary | ICD-10-CM | POA: Diagnosis present

## 2021-02-25 DIAGNOSIS — Z720 Tobacco use: Secondary | ICD-10-CM | POA: Diagnosis present

## 2021-02-25 DIAGNOSIS — E785 Hyperlipidemia, unspecified: Secondary | ICD-10-CM | POA: Diagnosis present

## 2021-02-25 DIAGNOSIS — Z66 Do not resuscitate: Secondary | ICD-10-CM | POA: Diagnosis not present

## 2021-02-25 DIAGNOSIS — T82898A Other specified complication of vascular prosthetic devices, implants and grafts, initial encounter: Secondary | ICD-10-CM | POA: Diagnosis present

## 2021-02-25 LAB — COMPREHENSIVE METABOLIC PANEL
ALT: 22 U/L (ref 0–44)
AST: 19 U/L (ref 15–41)
Albumin: 3.4 g/dL — ABNORMAL LOW (ref 3.5–5.0)
Alkaline Phosphatase: 68 U/L (ref 38–126)
Anion gap: 6 (ref 5–15)
BUN: 33 mg/dL — ABNORMAL HIGH (ref 8–23)
CO2: 26 mmol/L (ref 22–32)
Calcium: 9.1 mg/dL (ref 8.9–10.3)
Chloride: 107 mmol/L (ref 98–111)
Creatinine, Ser: 0.98 mg/dL (ref 0.61–1.24)
GFR, Estimated: >60 mL/min (ref >60)
Glucose, Bld: 99 mg/dL (ref 70–99)
Potassium: 4 mmol/L (ref 3.5–5.1)
Sodium: 139 mmol/L (ref 135–145)
Total Bilirubin: 1.2 mg/dL (ref 0.3–1.2)
Total Protein: 6.2 g/dL — ABNORMAL LOW (ref 6.5–8.1)

## 2021-02-25 LAB — URINALYSIS, ROUTINE W REFLEX MICROSCOPIC
Bacteria, UA: NONE SEEN
Bilirubin Urine: NEGATIVE
Glucose, UA: NEGATIVE mg/dL
Ketones, ur: 20 mg/dL — AB
Leukocytes,Ua: NEGATIVE
Nitrite: NEGATIVE
Protein, ur: 30 mg/dL — AB
Specific Gravity, Urine: 1.016 (ref 1.005–1.030)
pH: 5 (ref 5.0–8.0)

## 2021-02-25 LAB — HEPARIN LEVEL (UNFRACTIONATED): Heparin Unfractionated: >1.1 IU/mL — ABNORMAL HIGH (ref 0.30–0.70)

## 2021-02-25 LAB — CBC WITH DIFFERENTIAL/PLATELET
Abs Immature Granulocytes: 0.23 10*3/uL — ABNORMAL HIGH (ref 0.00–0.07)
Basophils Absolute: 0 10*3/uL (ref 0.0–0.1)
Basophils Relative: 0 %
Eosinophils Absolute: 0.1 10*3/uL (ref 0.0–0.5)
Eosinophils Relative: 0 %
HCT: 30.6 % — ABNORMAL LOW (ref 39.0–52.0)
Hemoglobin: 9.8 g/dL — ABNORMAL LOW (ref 13.0–17.0)
Immature Granulocytes: 1 %
Lymphocytes Relative: 2 %
Lymphs Abs: 0.3 10*3/uL — ABNORMAL LOW (ref 0.7–4.0)
MCH: 31.7 pg (ref 26.0–34.0)
MCHC: 32 g/dL (ref 30.0–36.0)
MCV: 99 fL (ref 80.0–100.0)
Monocytes Absolute: 0.6 10*3/uL (ref 0.1–1.0)
Monocytes Relative: 3 %
Neutro Abs: 15.5 10*3/uL — ABNORMAL HIGH (ref 1.7–7.7)
Neutrophils Relative %: 94 %
Platelets: 48 10*3/uL — ABNORMAL LOW (ref 150–400)
RBC: 3.09 MIL/uL — ABNORMAL LOW (ref 4.22–5.81)
RDW: 21.4 % — ABNORMAL HIGH (ref 11.5–15.5)
WBC: 16.7 10*3/uL — ABNORMAL HIGH (ref 4.0–10.5)
nRBC: 0 % (ref 0.0–0.2)

## 2021-02-25 LAB — PROTIME-INR
INR: 1.2 (ref 0.8–1.2)
Prothrombin Time: 15.2 seconds (ref 11.4–15.2)

## 2021-02-25 LAB — APTT: aPTT: 40 seconds — ABNORMAL HIGH (ref 24–36)

## 2021-02-25 MED ORDER — HYDROMORPHONE HCL 1 MG/ML IJ SOLN
1.0000 mg | Freq: Once | INTRAMUSCULAR | Status: AC
  Administered 2021-02-25: 1 mg via INTRAVENOUS
  Filled 2021-02-25: qty 1

## 2021-02-25 MED ORDER — PANTOPRAZOLE SODIUM 40 MG PO TBEC
40.0000 mg | DELAYED_RELEASE_TABLET | Freq: Two times a day (BID) | ORAL | Status: DC
  Administered 2021-02-26 – 2021-03-08 (×21): 40 mg via ORAL
  Filled 2021-02-25 (×25): qty 1

## 2021-02-25 MED ORDER — LORAZEPAM 2 MG/ML IJ SOLN
1.0000 mg | Freq: Once | INTRAMUSCULAR | Status: AC
  Administered 2021-02-25: 1 mg via INTRAVENOUS
  Filled 2021-02-25: qty 1

## 2021-02-25 MED ORDER — IOHEXOL 350 MG/ML SOLN
80.0000 mL | Freq: Once | INTRAVENOUS | Status: AC | PRN
  Administered 2021-02-25: 80 mL via INTRAVENOUS

## 2021-02-25 MED ORDER — POLYETHYLENE GLYCOL 3350 17 G PO PACK: 17.0000 g | PACK | Freq: Every day | ORAL | Status: DC

## 2021-02-25 MED ORDER — PREGABALIN 50 MG PO CAPS: 50.0000 mg | ORAL_CAPSULE | ORAL | Status: DC

## 2021-02-25 MED ORDER — DULOXETINE HCL 30 MG PO CPEP
30.0000 mg | ORAL_CAPSULE | Freq: Every day | ORAL | Status: AC
  Administered 2021-02-25 – 2021-02-28 (×4): 30 mg via ORAL
  Filled 2021-02-25 (×4): qty 1

## 2021-02-25 MED ORDER — DULOXETINE HCL 30 MG PO CPEP: 30.0000 mg | ORAL_CAPSULE | ORAL | Status: DC

## 2021-02-25 MED ORDER — DULOXETINE HCL 30 MG PO CPEP
60.0000 mg | ORAL_CAPSULE | Freq: Every day | ORAL | Status: DC
  Administered 2021-03-01 – 2021-03-08 (×8): 60 mg via ORAL
  Filled 2021-02-25 (×10): qty 2

## 2021-02-25 MED ORDER — ASPIRIN EC 81 MG PO TBEC
81.0000 mg | DELAYED_RELEASE_TABLET | Freq: Every day | ORAL | Status: DC
  Administered 2021-02-26 – 2021-03-03 (×6): 81 mg via ORAL
  Filled 2021-02-25 (×6): qty 1

## 2021-02-25 MED ORDER — HEPARIN (PORCINE) 25000 UT/250ML-% IV SOLN
1400.0000 [IU]/h | INTRAVENOUS | Status: DC
  Administered 2021-02-25: 1400 [IU]/h via INTRAVENOUS
  Filled 2021-02-25: qty 250

## 2021-02-25 MED ORDER — SODIUM CHLORIDE 0.9 % IV SOLN
INTRAVENOUS | Status: DC
Start: 2021-02-25 — End: 2021-02-28

## 2021-02-25 MED ORDER — FENTANYL 100 MCG/HR TD PT72
1.0000 | MEDICATED_PATCH | TRANSDERMAL | Status: DC
  Administered 2021-02-26 – 2021-03-06 (×5): 1 via TRANSDERMAL
  Filled 2021-02-25 (×7): qty 1

## 2021-02-25 MED ORDER — LACTULOSE 10 GM/15ML PO SOLN: 30.0000 g | ORAL | Status: DC | PRN

## 2021-02-25 MED ORDER — ACETAMINOPHEN 650 MG RE SUPP: 650.0000 mg | Freq: Four times a day (QID) | RECTAL | Status: DC | PRN

## 2021-02-25 MED ORDER — ONDANSETRON HCL 4 MG/2ML IJ SOLN: 4.0000 mg | Freq: Four times a day (QID) | INTRAMUSCULAR | Status: DC | PRN

## 2021-02-25 MED ORDER — PREGABALIN 50 MG PO CAPS
50.0000 mg | ORAL_CAPSULE | Freq: Three times a day (TID) | ORAL | Status: DC
  Administered 2021-03-01 (×2): 50 mg via ORAL
  Filled 2021-02-25: qty 2
  Filled 2021-02-25: qty 1

## 2021-02-25 MED ORDER — PREGABALIN 50 MG PO CAPS
50.0000 mg | ORAL_CAPSULE | Freq: Two times a day (BID) | ORAL | Status: DC
  Administered 2021-02-26 – 2021-02-28 (×6): 50 mg via ORAL
  Filled 2021-02-25 (×6): qty 1

## 2021-02-25 MED ORDER — HYDROMORPHONE HCL 2 MG/ML IJ SOLN
2.0000 mg | Freq: Once | INTRAMUSCULAR | Status: AC
Start: 2021-02-25 — End: 2021-02-25
  Administered 2021-02-25: 2 mg via INTRAVENOUS
  Filled 2021-02-25: qty 1

## 2021-02-25 MED ORDER — OXYCODONE HCL 5 MG PO TABS
10.0000 mg | ORAL_TABLET | ORAL | Status: DC | PRN
  Administered 2021-02-25 – 2021-03-08 (×35): 10 mg via ORAL
  Filled 2021-02-25 (×39): qty 2

## 2021-02-25 MED ORDER — ACETAMINOPHEN 325 MG PO TABS
650.0000 mg | ORAL_TABLET | Freq: Four times a day (QID) | ORAL | Status: DC | PRN
  Administered 2021-03-03: 650 mg via ORAL
  Filled 2021-02-25 (×2): qty 2

## 2021-02-25 NOTE — Progress Notes (Signed)
Left lower extremity venous duplex has been completed. Preliminary results can be found in CV Proc through chart review.  Results were given to Dr. Zenia Resides.  02/25/21 4:47 PM Carlos Levering RVT

## 2021-02-25 NOTE — ED Provider Notes (Signed)
Emergency Medicine Provider Triage Evaluation Note  Brian Benton , a 65 y.o. male  was evaluated in triage.  Pt complains of left hip pain.  It started hurting him two weeks ago.  No fevers.  He is anticoagulated with eliquis for a blood clot in the left leg.  He has been unable to walk on the left leg dur to pain for two weeks.  He has not had any imaging since this pain worsened.  [facility] made some changes to his meds on the 12th.  He took a 10 mg oxycodone shortly prior to arrival without relief..  Review of Systems  Positive: Left leg pain, leg swelling Negative: Shortness of breath, chest pain  Physical Exam  BP 118/85 (BP Location: Left Arm)   Pulse 96   Temp 98.3 F (36.8 C) (Oral)   Resp 20   Ht 6' (1.829 m)   Wt 77.1 kg   SpO2 98%   BMI 23.06 kg/m  Gen:   Awake, appears uncomfortable Resp:  Normal effort  MSK:   Moves extremities without difficulty  Other:  Obvious edema of the left leg.   Medical Decision Making  Medically screening exam initiated at 2:47 PM.  Appropriate orders placed.  Brian Benton was informed that the remainder of the evaluation will be completed by another provider, this initial triage assessment does not replace that evaluation, and the importance of remaining in the ED until their evaluation is complete.  Note: Portions of this report may have been transcribed using voice recognition software. Every effort was made to ensure accuracy; however, inadvertent computerized transcription errors may be present    Ollen Gross 02/25/21 1455    Lacretia Leigh, MD 02/27/21 1148

## 2021-02-25 NOTE — ED Provider Notes (Signed)
[facility] DEPT Provider Note   CSN: [id number] Arrival date & time: 02/25/21  1354     History Chief Complaint  Patient presents with   Hip Pain    Brian Benton is a 65 y.o. 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  Patient has progressively worsening left hip pain and left leg swelling for the last 2 weeks.  Patient has been on chronic oxycodone with no relief.  Patient has a history of DVT is already on Eliquis.  Denies any chest pain or shortness of breath.  The history is provided by the patient.      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 ([place]) 06/08/2020   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 ([place]) 03/06/2020   Leg swelling 03/02/2020   Paraspinal mass 03/02/2020   Acute deep vein thrombosis (DVT) of left lower extremity ([place]) 03/02/2020   Essential hypertension 10/31/2018   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   Mixed hyperlipidemia 10/03/2018   NEVUS, ATYPICAL 10/23/2007   Tobacco abuse 02/21/2007    Past Surgical History:  Procedure Laterality Date   BLADDER SURGERY     [place]   COLONOSCOPY     CYSTECTOMY     CYSTOSCOPY WITH BIOPSY N/A 07/25/2014   Procedure: CYSTOSCOPY WITH BLADDER BIOPSY AND FULGERATION;  Surgeon: [name], MD;  Location: [facility];  Service: Urology;  Laterality: N/A;   CYSTOSCOPY WITH BIOPSY N/A 11/28/2014   Procedure: CYSTOSCOPY WITH  BLADDER BIOPSY;  Surgeon: [name], MD;  Location: [facility];   Service: Urology;  Laterality: N/A;   HEMORRHOID SURGERY  03/15/2012   Procedure: HEMORRHOIDECTOMY;  Surgeon: [name], MD;  Location: [facility];  Service: General;  Laterality: N/A;   HERNIA REPAIR     INGUINAL HERNIA REPAIR Bilateral 2006   INTRAVASCULAR ULTRASOUND/IVUS N/A 03/13/2020   Procedure: Intravascular Ultrasound/IVUS;  Surgeon: Elam Dutch, MD;  Location: [place] [facility];  Service: Cardiovascular;  Laterality: N/A;   IR IMAGING GUIDED PORT INSERTION  06/01/2020   IR INTRAVASCULAR ULTRASOUND NON CORONARY  06/08/2020   IR INTRAVASCULAR ULTRASOUND NON CORONARY  06/17/2020   IR IVC FILTER PLMT / S&I /IMG GUID/MOD SED  06/08/2020   IR PTA VENOUS EXCEPT DIALYSIS CIRCUIT  06/08/2020   IR RADIOLOGIST EVAL & MGMT  04/30/2020   IR RADIOLOGIST EVAL & MGMT  05/27/2020   IR RADIOLOGIST EVAL & MGMT  07/21/2020   IR TRANSCATH PLC STENT  INITIAL VEIN  INC ANGIOPLASTY  06/17/2020   IR TRANSCATH PLC STENT 1ST ART NOT LE CV CAR VERT CAR  06/08/2020   IR US GUIDE VASC ACCESS LEFT  06/08/2020   IR US GUIDE VASC ACCESS LEFT  06/17/2020   IR VENO/EXT/UNI LEFT  06/08/2020   IR VENO/EXT/UNI LEFT  06/17/2020   IVC VENOGRAPHY N/A 03/13/2020   Procedure: IVC Venography;  Surgeon: Elam Dutch, MD;  Location: [place] [facility];  Service: Cardiovascular;  Laterality: N/A;   LOWER EXTREMITY VENOGRAPHY Left 03/13/2020   Procedure: LOWER EXTREMITY VENOGRAPHY;  Surgeon: Elam Dutch, MD;  Location: [place] [facility];  Service: Cardiovascular;  Laterality: Left;   MASS EXCISION Left 11/12/2018   Procedure: EXCISION MASS LEFT HAND;  Surgeon: Daryll Brod, MD;  Location: [place] [facility];  Service: Orthopedics;  Laterality: Left;  FAB   PERIPHERAL VASCULAR INTERVENTION  03/13/2020   Procedure: PERIPHERAL VASCULAR INTERVENTION;  Surgeon: Elam Dutch, MD;  Location: [place] [facility];  Service: Cardiovascular;;   PERIPHERAL VASCULAR THROMBECTOMY N/A 03/13/2020   Procedure: PERIPHERAL  VASCULAR THROMBECTOMY;  Surgeon: Elam Dutch, MD;  Location: [place] [facility];  Service: Cardiovascular;  Laterality: N/A;   RADIOLOGY WITH ANESTHESIA N/A 02/20/2020   Procedure: MRI LUMBAR W/O CONTRAST  WITH ANESTHESIA;  Surgeon: Radiologist, Medication, MD;  Location: [place];  Service: Radiology;  Laterality: N/A;   RADIOLOGY WITH ANESTHESIA N/A 06/08/2020   Procedure: IR WITH ANESTHESIA VENOGRAM;  Surgeon: Radiologist, Medication, MD;  Location: [place];  Service: Radiology;  Laterality: N/A;   TESTICLE SURGERY  2004   Ruptured Undescended Right testicle    TRANSURETHRAL RESECTION OF BLADDER TUMOR WITH GYRUS (TURBT-GYRUS) N/A 05/23/2014   Procedure: TRANSURETHRAL RESECTION OF BLADDER TUMOR WITH GYRUS (TURBT-GYRUS);  Surgeon: [name], MD;  Location: [facility];  Service: Urology;  Laterality: N/A;   WISDOM TOOTH EXTRACTION         Family History  Problem Relation Age of Onset   Cancer Mother        leukemia   Diabetes Brother     Social History   Tobacco Use   Smoking status: Former    Packs/day: 0.00    Years: 30.00    Pack years: 0.00    Types: Cigarettes   Smokeless tobacco: Never   Tobacco comments:    Smokes 2 cigarettes/day.  Vaping Use   Vaping Use: Never used  Substance Use Topics   Alcohol use: Not Currently    Comment: many 4 times a years a social drink   Drug use: No    Home Medications Prior to Admission medications   Medication Sig Start Date End Date Taking? Authorizing Provider  apixaban (ELIQUIS) 5 MG TABS tablet TAKE 1 TABLET (5 MG TOTAL) BY MOUTH 2 (TWO) TIMES DAILY. 02/02/21 02/02/22  Volanda Napoleon, MD  ASPIRIN LOW DOSE 81 MG EC tablet TAKE 1 TABLET BY MOUTH EVERY DAY 10/12/20   Patwardhan, Reynold Bowen, MD  Calcium Carbonate-Vit D-Min (CALTRATE PLUS PO) Take 1 tablet by mouth daily.    [provider]  dexamethasone (DECADRON) 4 MG tablet Take 2 tablets (8 mg total) by mouth daily. Start the day after chemotherapy for  2 days. 01/13/21   Volanda Napoleon, MD  dexamethasone (DECADRON) 6 MG tablet Take 3 tablets (18 mg total) by mouth daily. **You MUST take with food in the morning.** 01/13/21   Volanda Napoleon, MD  fentaNYL (DURAGESIC) 100 MCG/HR Place 1 patch onto the skin every other day. 02/10/21   Volanda Napoleon, MD  gabapentin (NEURONTIN) 600 MG tablet TAKE 1-2 TABLETS (600 MG TOTAL) BY MOUTH IN THE MORNING, AT NOON, IN THE EVENING,AND AT BEDTIME 12/23/20   Volanda Napoleon, MD  gabapentin (NEURONTIN) 600 MG tablet Take 2 tablets (1,200 mg total) by mouth in the morning, at noon, in the evening, and at bedtime. 01/05/21 01/05/22  Volanda Napoleon, MD  HYDROmorphone (DILAUDID) 8 MG tablet Take 1 tablet (8 mg total) by mouth every 6 (six) hours as needed for severe pain. Patient not taking: Reported on 02/10/2021 01/13/21   Volanda Napoleon, MD  lactulose, encephalopathy, (CHRONULAC) 10 GM/15ML SOLN TAKE 30MLS BY MOUTH EVERY 4 HOURS AS NEEDED 06/26/20 06/26/21  Volanda Napoleon, MD  lidocaine-prilocaine (EMLA) cream Apply to affected area once 01/13/21   Ennever, Rudell Cobb, MD  LORazepam (ATIVAN) 0.5 MG tablet Take 1 tablet (0.5 mg total) by mouth every 6 (six) hours as needed (Nausea or vomiting). 01/13/21   Volanda Napoleon, MD  LORazepam (ATIVAN) 0.5 MG tablet Take 2 tablets (1 mg total) by mouth every 6 (six) hours as needed for anxiety. 02/16/21 08/15/21  Volanda Napoleon, MD  meloxicam (MOBIC) 15 MG tablet TAKE 1 TABLET (15 MG TOTAL) BY MOUTH DAILY. 02/22/21 02/22/22  Volanda Napoleon, MD  Multiple Vitamins-Minerals (MULTIVITAMIN WITH MINERALS) tablet Take 1 tablet by mouth daily.    [provider]  ondansetron (ZOFRAN) 8 MG tablet Take 1 tablet (8 mg total) by mouth 2 (two) times daily as needed for refractory nausea / vomiting. Start on day 3 after chemo. 01/13/21   Volanda Napoleon, MD  ondansetron (ZOFRAN-ODT) 8 MG disintegrating tablet TAKE 1 TABLET BY MOUTH EVERY 8 HOURS AS NEEDED FOR NAUSEA & VOMITING 09/14/20 09/14/21   Volanda Napoleon, MD  orphenadrine (NORFLEX) 100 MG tablet TAKE 1 TABLET (100 MG TOTAL) BY MOUTH 2 (TWO) TIMES DAILY. 11/03/20   Volanda Napoleon, MD  oxyCODONE (OXYCONTIN) 10 mg 12 hr tablet Take 1 tablet (10 mg total) by mouth every 4 (four) hours as needed. 01/21/21   Volanda Napoleon, MD  Oxycodone HCl 10 MG TABS TAKE 1 TABLET BY MOUTH EVERY 4 HOURS AS NEEDED 02/22/21 08/21/21  Volanda Napoleon, MD  pantoprazole (PROTONIX) 40 MG tablet Take 1 tablet (40 mg total) by mouth 2 (two) times daily. 01/13/21 01/13/22  Volanda Napoleon, MD  prochlorperazine (COMPAZINE) 10 MG tablet TAKE 1 TABLET (10 MG TOTAL) BY MOUTH EVERY 6 (SIX) HOURS AS NEEDED FOR NAUSEA OR VOMITING. 09/18/20 09/18/21  Volanda Napoleon, MD  prochlorperazine (COMPAZINE) 10 MG tablet Take 1 tablet (10 mg total) by mouth every 6 (six) hours as needed (Nausea or vomiting). 01/13/21   Volanda Napoleon, MD  sodium chloride (OCEAN) 0.65 % SOLN nasal spray Place 1 spray into both nostrils daily as needed for congestion (Allergies).    [provider]  temazepam (RESTORIL) 15 MG capsule Take 1 capsule (15 mg total) by mouth at bedtime as needed for sleep. 02/02/21   Volanda Napoleon, MD  traMADol (ULTRAM) 50 MG tablet TAKE 2 TABLETS BY MOUTH EVERY 6 HOURS AS NEEDED. 02/08/21   Volanda Napoleon, MD  enoxaparin (LOVENOX) 80 MG/0.8ML injection Inject 0.8 mLs (80 mg total) into the skin every 12 (twelve) hours. 05/20/20 07/29/20  Volanda Napoleon, MD    Allergies    Patient has no known allergies.  Review of Systems   Review of Systems  Musculoskeletal:        L hip pain   All other systems reviewed and are negative.  Physical Exam Updated Vital Signs BP 122/79 (BP Location: Right Arm)   Pulse 85   Temp 98.4 F (36.9 C) (Oral)   Resp 18   Ht 6' (1.829 m)   Wt 77.1 kg   SpO2 97%   BMI 23.06 kg/m   Physical  Exam Vitals and nursing note reviewed.  Constitutional:      Comments: Chronically ill and uncomfortable  HENT:     Head:  Normocephalic.     Mouth/Throat:     Mouth: Mucous membranes are moist.  Eyes:     Extraocular Movements: Extraocular movements intact.     Pupils: Pupils are equal, round, and reactive to light.  Cardiovascular:     Rate and Rhythm: Normal rate and regular rhythm.     Pulses: Normal pulses.     Heart sounds: Normal heart sounds.  Pulmonary:     Effort: Pulmonary effort is normal.     Breath sounds: Normal breath sounds.  Abdominal:     General: Abdomen is flat.     Palpations: Abdomen is soft.  Musculoskeletal:     Cervical back: Normal range of motion and neck supple.     Comments: Left hip is flexed and has decreased range of motion.  Patient has 2+ edema in the left leg.  Patient has 1+ pulses in that left DP.   Skin:    General: Skin is warm.     Capillary Refill: Capillary refill takes less than 2 seconds.  Neurological:     General: No focal deficit present.     Mental Status: He is oriented to person, place, and time.  Psychiatric:        Mood and Affect: Mood normal.        Behavior: Behavior normal.    ED Results / Procedures / Treatments   Labs (all labs ordered are listed, but only abnormal results are displayed) Labs Reviewed  CBC WITH DIFFERENTIAL/PLATELET  COMPREHENSIVE METABOLIC PANEL    EKG None  Radiology DG Hip Unilat W or Wo Pelvis 2-3 Views Left  Result Date: 02/25/2021 CLINICAL DATA:  Left hip pain EXAM: DG HIP (WITH OR WITHOUT PELVIS) 2-3V LEFT COMPARISON:  None. FINDINGS: Limited study by positioning. The patient would not lie flat for imaging. No definite visible acute bony abnormality. Degenerative changes in the hips bilaterally. IMPRESSION: Limited study by positioning. The patient would not lie flat. No definite visible fracture. Electronically Signed   By: Rolm Baptise M.D.   On: 02/25/2021 15:36    Procedures Procedures  CRITICAL CARE Performed by: Wandra Arthurs   Total critical care time: 30 minutes  Critical care time was exclusive  of separately billable procedures and treating other patients.  Critical care was necessary to treat or prevent imminent or life-threatening deterioration.  Critical care was time spent personally by me on the following activities: development of treatment plan with patient and/or surrogate as well as nursing, discussions with consultants, evaluation of patient's response to treatment, examination of patient, obtaining history from patient or surrogate, ordering and performing treatments and interventions, ordering and review of laboratory studies, ordering and review of radiographic studies, pulse oximetry and re-evaluation of patient's condition.   Medications Ordered in ED Medications  HYDROmorphone (DILAUDID) injection 1 mg (has no administration in time range)    ED Course  I have reviewed the triage vital signs and the nursing notes.  Pertinent labs & imaging results that were available during my care of the patient were reviewed by me and considered in my medical decision making (see chart for details).    MDM Rules/Calculators/A&P                          Brian Benton is a 65 y.o. male  here with left leg pain and swelling.  Patient has a history of DVT and already has metastatic cancer to the hip and spine and retroperitoneum.  Concern for recurrent DVT.  Also concern for possible worsening cancer or RP bleed.  Plan to get CT lumbar spine and CT pelvis  9:41 PM Labs show white count is 16.  Patient does have a platelet count of 48 which is lower than 70 yesterday.  Patient CT showed known left sacral metastasis and no RP bleed.  Patient does have extensive DVT from left femoral down to the popliteal.  He is already on Eliquis.  I talked to Dr. Burr Medico from oncology.  She recommends heparin and states that Dr. Marin Olp will see patient in the hospital.  Hospitalist to admit   Final Clinical Impression(s) / ED Diagnoses Final diagnoses:  None    Rx / DC Orders ED Discharge  Orders     None        Drenda Freeze, MD 02/25/21 2144

## 2021-02-25 NOTE — ED Triage Notes (Addendum)
Patient c/o left hip that is worse for the pasts 4-5 days. Patient states he has a tumor on the left hip.  Noted swelling to bilateral lower extremities L>R.

## 2021-02-25 NOTE — ED Notes (Signed)
Pt medicated. CT called to let them know so they can come get pt for CT

## 2021-02-25 NOTE — ED Notes (Signed)
Vascular at the bedside. 

## 2021-02-25 NOTE — H&P (Signed)
History and Physical    Brian Benton [id number] DOB: 26-Sep-1955 DOA: 02/25/2021  PCP: [place], [place]  Patient coming from: Home.  I have personally briefly reviewed patient's old medical records in [place]  Chief Complaint: Left hip pain.  HPI: Brian Benton is a 65 y.o. 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  He denied fever, chills, rhinorrhea, sore throat, dyspnea, wheezing or hemoptysis.  No chest pain, palpitations, diaphoresis, PND, orthopnea or pitting edema of the lower extremities.  Denied abdominal pain, nausea, emesis, diarrhea, constipation, melena or hematochezia.  No dysuria, frequency or hematuria.  No polyuria, polydipsia, polyphagia or blurred vision.  ED Course: Initial vital signs were temperature 98 F, pulse 99, respiration 18, BP 129/87 mmHg O2 sat 97% on room air.  Patient received hydromorphone 1 mg IVP twice, lorazepam 1 mg IVP x1 and I added 2 mg hydromorphone single dose.  Lab work: Urinalysis shows small hemoglobinuria, ketonuria of 20 and proteinuria 30 mg/dL.  CBC showed a white count of 16.7, hemoglobin 9.8 g/dL and platelets 48.  PT was 15.2, INR 1.2 and PTT 40.  CMP showed a BUN of 33 mg/dL, total protein of 6.2 and albumin of 3.4 g/dL.  The rest of the CMP results was unremarkable.  Imaging: Left hip x-ray did not show any visible fracture.  DVT study showed age indeterminant deep vein thrombosis involving the left external iliac, left common, left popliteal, left peroneal and left gastrocnemius veins.  CT abdomen/pelvis with contrast showed persistent complete occlusion of the indwelling left iliofemoral venous stent with interval increase of an  almost occlusive inferior vena cava thrombus.  There is circumferential urinary bladder wall thickening.  Similar appearing known left sacral ala metastasis.  Please see images and full radiology report for further detail.  Review of Systems: As per HPI otherwise all other systems reviewed and are negative.  Past Medical History:  Diagnosis Date   Cancer [name])    bladder   History of bladder cancer    s/p  TURBT 07-25-2014   Hyperlipidemia    Mass of left hand    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 ([place]) 03/06/2020   Past Surgical History:  Procedure Laterality Date   BLADDER SURGERY     [place]   COLONOSCOPY     CYSTECTOMY     CYSTOSCOPY WITH BIOPSY N/A 07/25/2014   Procedure: CYSTOSCOPY WITH BLADDER BIOPSY AND FULGERATION;  Surgeon: [name], MD;  Location: [facility];  Service: Urology;  Laterality: N/A;   CYSTOSCOPY WITH BIOPSY N/A 11/28/2014   Procedure: CYSTOSCOPY WITH  BLADDER BIOPSY;  Surgeon: [name], MD;  Location: [facility];  Service: Urology;  Laterality: N/A;   HEMORRHOID SURGERY  03/15/2012   Procedure: HEMORRHOIDECTOMY;  Surgeon: [name], MD;  Location: [facility];  Service: General;  Laterality: N/A;   HERNIA REPAIR     INGUINAL HERNIA REPAIR Bilateral 2006   INTRAVASCULAR ULTRASOUND/IVUS N/A 03/13/2020   Procedure: Intravascular Ultrasound/IVUS;  Surgeon: Elam Dutch, MD;  Location: [place] [facility];  Service: Cardiovascular;  Laterality: N/A;   IR IMAGING GUIDED PORT INSERTION  06/01/2020   IR INTRAVASCULAR ULTRASOUND NON CORONARY  06/08/2020   IR  INTRAVASCULAR ULTRASOUND NON CORONARY  06/17/2020   IR IVC FILTER PLMT / S&I /IMG GUID/MOD SED  06/08/2020   IR PTA VENOUS EXCEPT DIALYSIS CIRCUIT  06/08/2020   IR RADIOLOGIST EVAL & MGMT  04/30/2020   IR RADIOLOGIST EVAL & MGMT  05/27/2020   IR RADIOLOGIST EVAL & MGMT  07/21/2020   IR TRANSCATH PLC STENT  INITIAL VEIN  INC ANGIOPLASTY  06/17/2020   IR TRANSCATH PLC  STENT 1ST ART NOT LE CV CAR VERT CAR  06/08/2020   IR US GUIDE VASC ACCESS LEFT  06/08/2020   IR US GUIDE VASC ACCESS LEFT  06/17/2020   IR VENO/EXT/UNI LEFT  06/08/2020   IR VENO/EXT/UNI LEFT  06/17/2020   IVC VENOGRAPHY N/A 03/13/2020   Procedure: IVC Venography;  Surgeon: Elam Dutch, MD;  Location: [place] [facility];  Service: Cardiovascular;  Laterality: N/A;   LOWER EXTREMITY VENOGRAPHY Left 03/13/2020   Procedure: LOWER EXTREMITY VENOGRAPHY;  Surgeon: Elam Dutch, MD;  Location: [place] [facility];  Service: Cardiovascular;  Laterality: Left;   MASS EXCISION Left 11/12/2018   Procedure: EXCISION MASS LEFT HAND;  Surgeon: Daryll Brod, MD;  Location: [place];  Service: Orthopedics;  Laterality: Left;  FAB   PERIPHERAL VASCULAR INTERVENTION  03/13/2020   Procedure: PERIPHERAL VASCULAR INTERVENTION;  Surgeon: Elam Dutch, MD;  Location: [place] [facility];  Service: Cardiovascular;;   PERIPHERAL VASCULAR THROMBECTOMY N/A 03/13/2020   Procedure: PERIPHERAL VASCULAR THROMBECTOMY;  Surgeon: Elam Dutch, MD;  Location: [place] [facility];  Service: Cardiovascular;  Laterality: N/A;   RADIOLOGY WITH ANESTHESIA N/A 02/20/2020   Procedure: MRI LUMBAR W/O CONTRAST  WITH ANESTHESIA;  Surgeon: Radiologist, Medication, MD;  Location: [place];  Service: Radiology;  Laterality: N/A;   RADIOLOGY WITH ANESTHESIA N/A 06/08/2020   Procedure: IR WITH ANESTHESIA VENOGRAM;  Surgeon: Radiologist, Medication, MD;  Location: [place];  Service: Radiology;  Laterality: N/A;   TESTICLE SURGERY  2004   Ruptured Undescended Right testicle    TRANSURETHRAL RESECTION OF BLADDER TUMOR WITH GYRUS (TURBT-GYRUS) N/A 05/23/2014   Procedure: TRANSURETHRAL RESECTION OF BLADDER TUMOR WITH GYRUS (TURBT-GYRUS);  Surgeon: [name], MD;  Location: [facility];  Service: Urology;  Laterality: N/A;   WISDOM TOOTH EXTRACTION      Social History  reports that he has quit smoking. His  smoking use included cigarettes. He has never used smokeless tobacco. He reports that he does not currently use alcohol. He reports that he does not use drugs.  No Known Allergies  Family History  Problem Relation Age of Onset   Cancer Mother        leukemia   Diabetes Brother    Prior to Admission medications   Medication Sig Start Date End Date Taking? Authorizing Provider  acetaminophen (TYLENOL) 500 MG tablet Take 500 mg by mouth every 6 (six) hours as needed for moderate pain.   Yes [provider]  apixaban (ELIQUIS) 5 MG TABS tablet TAKE 1 TABLET (5 MG TOTAL) BY MOUTH 2 (TWO) TIMES DAILY. 02/02/21 02/02/22 Yes Ennever, Rudell Cobb, MD  ASPIRIN LOW DOSE 81 MG EC tablet TAKE 1 TABLET BY MOUTH EVERY DAY 10/12/20  Yes Patwardhan, Manish J, MD  DULoxetine (CYMBALTA) 30 MG capsule Take 30-60 mg by mouth as directed. Take 1 capsule (30 mg) at bedtime for 7 Days Then Take 2 capsules (60 mg) at bedtime   Yes [provider]  fentaNYL (DURAGESIC) 100 MCG/HR Place 1  patch onto the skin every other day. 02/10/21  Yes Ennever, Rudell Cobb, MD  lactulose, encephalopathy, (CHRONULAC) 10 GM/15ML SOLN TAKE 30MLS BY MOUTH EVERY 4 HOURS AS NEEDED Patient taking differently: Take 30 g by mouth every 4 (four) hours as needed. 06/26/20 06/26/21 Yes Ennever, Rudell Cobb, MD  lidocaine-prilocaine (EMLA) cream Apply to affected area once Patient taking differently: Apply 1 application topically as directed. Access port 01/13/21  Yes Ennever, Rudell Cobb, MD  Multiple Vitamins-Minerals (MULTIVITAMIN WITH MINERALS) tablet Take 1 tablet by mouth every other day.   Yes [provider]  naloxone (NARCAN) nasal spray 4 mg/0.1 mL Place 1 spray into the nose once. 02/22/21  Yes [provider]  oxyCODONE (OXYCONTIN) 10 mg 12 hr tablet Take 1 tablet (10 mg total) by mouth every 4 (four) hours as needed. 01/21/21  Yes Ennever, Rudell Cobb, MD  Oxycodone HCl 10 MG TABS TAKE 1 TABLET BY MOUTH EVERY 4 HOURS AS  NEEDED Patient taking differently: Take 10-20 mg by mouth every 4 (four) hours as needed (pain). 02/22/21 08/21/21 Yes Ennever, Rudell Cobb, MD  pantoprazole (PROTONIX) 40 MG tablet Take 1 tablet (40 mg total) by mouth 2 (two) times daily. 01/13/21 01/13/22 Yes Ennever, Rudell Cobb, MD  polyethylene glycol (MIRALAX / GLYCOLAX) 17 g packet Take 17 g by mouth daily.   Yes [provider]  pregabalin (LYRICA) 50 MG capsule Take 50 mg by mouth as directed. Take 1 capsule (50 mg) at bedtime for 3 nights, Take 1 capsule (50 mg) BID for 3 Days, Then Take 1 capsule (50 mg) TID 02/22/21  Yes [provider]  sodium chloride (OCEAN) 0.65 % SOLN nasal spray Place 1 spray into both nostrils daily as needed for congestion (Allergies).   Yes [provider]  traMADol (ULTRAM) 50 MG tablet TAKE 2 TABLETS BY MOUTH EVERY 6 HOURS AS NEEDED. Patient taking differently: Take 100 mg by mouth every 6 (six) hours as needed for moderate pain. 02/08/21  Yes Ennever, Rudell Cobb, MD  dexamethasone (DECADRON) 4 MG tablet Take 2 tablets (8 mg total) by mouth daily. Start the day after chemotherapy for 2 days. Patient not taking: No sig reported 01/13/21   Volanda Napoleon, MD  dexamethasone (DECADRON) 6 MG tablet Take 3 tablets (18 mg total) by mouth daily. **You MUST take with food in the morning.** Patient not taking: No sig reported 01/13/21   Volanda Napoleon, MD  gabapentin (NEURONTIN) 600 MG tablet TAKE 1-2 TABLETS (600 MG TOTAL) BY MOUTH IN THE MORNING, AT NOON, IN THE EVENING,AND AT BEDTIME Patient not taking: No sig reported 12/23/20   Volanda Napoleon, MD  gabapentin (NEURONTIN) 600 MG tablet Take 2 tablets (1,200 mg total) by mouth in the morning, at noon, in the evening, and at bedtime. Patient not taking: No sig reported 01/05/21 01/05/22  Volanda Napoleon, MD  HYDROmorphone (DILAUDID) 8 MG tablet Take 1 tablet (8 mg total) by mouth every 6 (six) hours as needed for severe pain. Patient not taking: No sig reported  01/13/21   Volanda Napoleon, MD  LORazepam (ATIVAN) 0.5 MG tablet Take 1 tablet (0.5 mg total) by mouth every 6 (six) hours as needed (Nausea or vomiting). Patient not taking: No sig reported 01/13/21   Volanda Napoleon, MD  LORazepam (ATIVAN) 0.5 MG tablet Take 2 tablets (1 mg total) by mouth every 6 (six) hours as needed for anxiety. Patient not taking: No sig reported 02/16/21 08/15/21  Volanda Napoleon,  MD  meloxicam (MOBIC) 15 MG tablet TAKE 1 TABLET (15 MG TOTAL) BY MOUTH DAILY. Patient not taking: No sig reported 02/22/21 02/22/22  Volanda Napoleon, MD  ondansetron (ZOFRAN) 8 MG tablet Take 1 tablet (8 mg total) by mouth 2 (two) times daily as needed for refractory nausea / vomiting. Start on day 3 after chemo. 01/13/21   Volanda Napoleon, MD  ondansetron (ZOFRAN-ODT) 8 MG disintegrating tablet TAKE 1 TABLET BY MOUTH EVERY 8 HOURS AS NEEDED FOR NAUSEA & VOMITING 09/14/20 09/14/21  Volanda Napoleon, MD  orphenadrine (NORFLEX) 100 MG tablet TAKE 1 TABLET (100 MG TOTAL) BY MOUTH 2 (TWO) TIMES DAILY. Patient not taking: Reported on 02/25/2021 11/03/20   Volanda Napoleon, MD  prochlorperazine (COMPAZINE) 10 MG tablet TAKE 1 TABLET (10 MG TOTAL) BY MOUTH EVERY 6 (SIX) HOURS AS NEEDED FOR NAUSEA OR VOMITING. Patient not taking: No sig reported 09/18/20 09/18/21  Volanda Napoleon, MD  prochlorperazine (COMPAZINE) 10 MG tablet Take 1 tablet (10 mg total) by mouth every 6 (six) hours as needed (Nausea or vomiting). Patient not taking: No sig reported 01/13/21   Volanda Napoleon, MD  temazepam (RESTORIL) 15 MG capsule Take 1 capsule (15 mg total) by mouth at bedtime as needed for sleep. Patient not taking: Reported on 02/25/2021 02/02/21   Volanda Napoleon, MD  enoxaparin (LOVENOX) 80 MG/0.8ML injection Inject 0.8 mLs (80 mg total) into the skin every 12 (twelve) hours. 05/20/20 07/29/20  Volanda Napoleon, MD   Physical Exam: Vitals:   02/25/21 1800 02/25/21 1853 02/25/21 1900 02/25/21 1930  BP: 120/70 140/82 134/81 (!)  149/93  Pulse: 81 87 82 89  Resp: 18 18    Temp:      TempSrc:      SpO2: 98% 97% 96% 95%  Weight:      Height:       Constitutional: Chronically ill-appearing.  NAD, calm, comfortable Eyes: PERRL, lids and conjunctivae normal ENMT: Mucous membranes are moist. Posterior pharynx clear of any exudate or lesions. Neck: Normal, supple, no masses, no thyromegaly Respiratory: Mild bilateral wheezing, no crackles. Normal respiratory effort. No accessory muscle use.  Cardiovascular: Regular rate and rhythm, no murmurs / rubs / gallops. No extremity edema. 2+ pedal pulses. No carotid bruits.  Abdomen: No distention.  Bowel sounds positive.  Soft, no tenderness, no masses palpated. No hepatosplenomegaly. Musculoskeletal: Left hip area TTP and severely impaired ROM.  No clubbing / cyanosis.  No contractures. Normal muscle tone.  Skin: Some areas of ecchymosis from venipuncture on extremities. Neurologic: CN 2-12 grossly intact. Sensation intact, DTR normal. Strength 5/5 in all 4.  Psychiatric: Normal judgment and insight. Alert and oriented x 3. Normal mood.   Labs on Admission: I have personally reviewed following labs and imaging studies  CBC: Recent Labs  Lab 02/24/21 1047 02/25/21 1639  WBC 20.8* 16.7*  NEUTROABS 18.7* 15.5*  HGB 10.1* 9.8*  HCT 30.8* 30.6*  MCV 96.0 99.0  PLT 73* 48*   Basic Metabolic Panel: Recent Labs  Lab 02/24/21 1047 02/25/21 1639  NA 133* 139  K 4.3 4.0  CL 99 107  CO2 28 26  GLUCOSE 101* 99  BUN 40* 33*  CREATININE 1.01 0.98  CALCIUM 9.4 9.1   GFR: Estimated Creatinine Clearance: 80.9 mL/min (by C-G formula based on SCr of 0.98 mg/dL).  Liver Function Tests: Recent Labs  Lab 02/24/21 1047 02/25/21 1639  AST 12* 19  ALT 19 22  ALKPHOS 57 68  BILITOT 0.7 1.2  PROT 5.7* 6.2*  ALBUMIN 3.7 3.4*   Urine analysis:    Component Value Date/Time   COLORURINE YELLOW 02/25/2021 1802   APPEARANCEUR CLEAR 02/25/2021 1802   LABSPEC 1.016  02/25/2021 1802   PHURINE 5.0 02/25/2021 1802   GLUCOSEU NEGATIVE 02/25/2021 1802   HGBUR SMALL (A) 02/25/2021 1802   BILIRUBINUR NEGATIVE 02/25/2021 1802   KETONESUR 20 (A) 02/25/2021 1802   PROTEINUR 30 (A) 02/25/2021 1802   NITRITE NEGATIVE 02/25/2021 1802   LEUKOCYTESUR NEGATIVE 02/25/2021 1802   Radiological Exams on Admission: CT ABDOMEN PELVIS W CONTRAST  Result Date: 02/25/2021 CLINICAL DATA:  Urologic cancer, staging bladder cancer with mets to bone. Prior PET-CT demonstrates Stable left retroperitoneal metastasis along the left anterior aspect of the lumbosacral junction involving the left iliopsoas musculature.  MRI demonstrates left sacral metastasis. EXAM: CT ABDOMEN AND PELVIS WITH CONTRAST CT LUMBAR SPINE WITHOUT CONTRAST TECHNIQUE: Multidetector CT imaging of the abdomen and pelvis was performed using the standard protocol following bolus administration of intravenous contrast. Multidetector CT imaging of the lumbar spine was performed without intravenous contrast administration. Multiplanar CT image reconstructions were also generated. CONTRAST:  50m OMNIPAQUE IOHEXOL 350 MG/ML SOLN COMPARISON:  PET-CT 09/29/2020, MRI lumbar spine 01/04/2021, ct abdomen/pelvis 05/18/20 FINDINGS: CT abdomen/pelvis: Lower chest: Linear atelectasis versus scarring of the right lower lobe. Limited evaluation due to motion artifact. No acute abnormality. Hepatobiliary: No focal liver abnormality. No gallstones, gallbladder wall thickening, or pericholecystic fluid. No biliary dilatation. Pancreas: No focal lesion. Normal pancreatic contour. No surrounding inflammatory changes. No main pancreatic ductal dilatation. Spleen: Normal in size without focal abnormality. Adrenals/Urinary Tract: No adrenal nodule bilaterally. Bilateral kidneys enhance symmetrically. There is a 1.7 cm fluid density lesion within the right kidney that likely represents a simple renal cyst. Similar findings are noted within the left  kidney measuring to 2.3 cm. Subcentimeter hypodensities are too small to characterize. No hydronephrosis. No hydroureter. Circumferential urinary bladder wall thickening. Stomach/Bowel: Stomach is within normal limits. No evidence of bowel wall thickening or dilatation. Stool throughout the colon. Appendix appears normal. 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 There is a left common iliac to external iliac to femoral vein stent. No abdominal aorta or iliac aneurysm. Severe calcified and noncalcified atherosclerotic plaque of the aorta and its branches. No abdominal, pelvic, or inguinal lymphadenopathy. Reproductive: Prostate is unremarkable. Other: No intraperitoneal free fluid. No intraperitoneal free gas. No organized fluid collection. Musculoskeletal: Similar-appearing approximately 6 cm soft tissue density along the iliopsoas muscle with associated poorly visualized erosion of the left sacral ala (2:57). No suspicious lytic or blastic osseous lesions. No acute displaced fracture. Multilevel degenerative changes of the spine. CT lumbar spine: Segmentation: 5 lumbar type vertebrae. Alignment: Normal. Vertebrae: Mild degenerative changes. No acute fracture or focal pathologic process. Paraspinal and other soft tissues: Negative. IMPRESSION: 1. Limited evaluation of the upper abdomen, pelvis, spine due to motion artifact. 2. 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 3. Circumferential urinary bladder wall thickening. 4. Similar-appearing known left sacral al a metastasis/erosion with associated iliopsoas soft tissue density. 5. Constipation. 6. No acute displaced fracture or traumatic listhesis of the lumbar spine. Electronically Signed   By: MIven FinnM.D.   On: 02/25/2021 20:51   CT L-SPINE NO CHARGE  Result Date: 02/25/2021 CLINICAL DATA:  Urologic cancer, staging bladder cancer with mets to bone. Prior PET-CT demonstrates Stable left retroperitoneal metastasis along the left anterior aspect of the lumbosacral junction involving the left iliopsoas musculature.  MRI demonstrates left sacral metastasis. EXAM: CT ABDOMEN AND PELVIS WITH CONTRAST CT LUMBAR SPINE WITHOUT CONTRAST TECHNIQUE: Multidetector CT imaging of the abdomen and pelvis was performed using the standard protocol following bolus administration of intravenous contrast. Multidetector CT imaging of the lumbar spine was performed without intravenous contrast administration. Multiplanar CT image reconstructions were also generated. CONTRAST:  34m OMNIPAQUE IOHEXOL 350 MG/ML SOLN COMPARISON:  PET-CT 09/29/2020, MRI lumbar spine 01/04/2021, ct abdomen/pelvis 05/18/20 FINDINGS: CT abdomen/pelvis: Lower chest: Linear atelectasis versus scarring of the right lower lobe. Limited evaluation due to motion artifact. No acute abnormality. Hepatobiliary: No focal liver abnormality. No gallstones, gallbladder wall thickening, or pericholecystic fluid. No biliary dilatation. Pancreas: No focal lesion. Normal pancreatic contour. No surrounding inflammatory changes. No main pancreatic ductal dilatation. Spleen: Normal in size without focal abnormality. Adrenals/Urinary Tract: No adrenal nodule bilaterally. Bilateral kidneys enhance symmetrically. There is a 1.7 cm fluid density lesion within the right kidney that likely represents a simple renal cyst. Similar findings are noted within the left kidney measuring to 2.3 cm. Subcentimeter hypodensities are too small to characterize. No hydronephrosis. No hydroureter. Circumferential urinary bladder wall thickening. Stomach/Bowel: Stomach is within normal limits. No evidence of bowel wall thickening or dilatation. Stool throughout the colon. Appendix appears  normal. 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 There is a left common iliac to external iliac to femoral vein stent. No abdominal aorta or iliac aneurysm. Severe calcified and noncalcified atherosclerotic plaque of the aorta and its branches. No abdominal, pelvic, or inguinal lymphadenopathy. Reproductive: Prostate is unremarkable. Other: No intraperitoneal free fluid. No intraperitoneal free gas. No organized fluid collection. Musculoskeletal: Similar-appearing approximately 6 cm soft tissue density along the iliopsoas muscle with associated poorly visualized erosion of the left sacral ala (2:57). No suspicious lytic or blastic osseous lesions. No acute displaced fracture. Multilevel degenerative changes of the spine. CT lumbar spine: Segmentation: 5 lumbar type vertebrae. Alignment: Normal. Vertebrae: Mild degenerative changes. No acute fracture or focal pathologic process. Paraspinal and other soft tissues: Negative. IMPRESSION: 1. Limited evaluation of the upper abdomen, pelvis, spine due to motion artifact. 2. 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 3. Circumferential urinary bladder wall thickening. 4. Similar-appearing known left sacral al a metastasis/erosion with associated iliopsoas soft tissue density. 5. Constipation. 6. No acute displaced fracture or traumatic listhesis of the lumbar spine. Electronically Signed   By: MIven FinnM.D.   On: 02/25/2021 20:51   DG Hip Unilat W or Wo Pelvis 2-3 Views Left  Result Date: 02/25/2021 CLINICAL DATA:  Left hip pain EXAM: DG HIP (WITH OR WITHOUT PELVIS) 2-3V LEFT COMPARISON:  None. FINDINGS: Limited study by positioning. The patient would not lie flat for imaging. No definite visible  acute bony abnormality. Degenerative changes in the hips bilaterally. IMPRESSION: Limited study by positioning. The patient would not lie flat. No definite visible fracture. Electronically Signed   By: KRolm BaptiseM.D.   On: 02/25/2021 15:36   VAS [place]OWER EXTREMITY VENOUS (DVT) (ONLY [facility] & [facility])  Result Date: 02/25/2021  Lower Venous DVT Study Patient  Name:  Brian Benton  Date of Exam:   02/25/2021 Medical Rec #: [id number]        Accession #:    [id number] Date of Birth: Apr 09, 1956        Patient Gender: M Patient Age:   23 years Exam Location:  [facility] Procedure:      VAS [place] LOWER EXTREMITY VENOUS (DVT) Referring Phys: Tulio Facundo YAO --------------------------------------------------------------------------------  Indications: Swelling.  Risk Factors: Cancer DVT. Limitations: Poor ultrasound/tissue interface and patient positioning, patient constant movement, poor patient cooperation. Comparison Study: 05/12/2020 - Sonographic survey of the left lower extremity                   demonstrates stent                   changes of the common femoral vein, with persisting DVT of the                   left                   common femoral vein stent, occlusive. Positive duplex for DVT                   at the                   SFJ, junction of the profunda vein, and through the femoral                   vein                   segment. Performing Technologist: Oliver Hum RVT  Examination Guidelines: A complete evaluation includes B-mode imaging, spectral Doppler, color Doppler, and power Doppler as needed of all accessible portions of each vessel. Bilateral testing is considered an integral part of a complete examination. Limited examinations for reoccurring indications may be performed as noted. The reflux portion of the exam is performed with the patient in reverse Trendelenburg.  +-----+---------------+---------+-----------+----------+--------------+  RIGHTCompressibilityPhasicitySpontaneityPropertiesThrombus Aging +-----+---------------+---------+-----------+----------+--------------+ CFV  Full           Yes      Yes                                 +-----+---------------+---------+-----------+----------+--------------+   +---------+---------------+---------+-----------+----------+-----------------+ LEFT     CompressibilityPhasicitySpontaneityPropertiesThrombus Aging    +---------+---------------+---------+-----------+----------+-----------------+ CFV                     No       No                   Age Indeterminate +---------+---------------+---------+-----------+----------+-----------------+ FV Prox  Partial        Yes      Yes                  Age Indeterminate +---------+---------------+---------+-----------+----------+-----------------+ FV Mid   Full                                                           +---------+---------------+---------+-----------+----------+-----------------+ FV DistalNone           No       No  Age Indeterminate +---------+---------------+---------+-----------+----------+-----------------+ POP      Partial        Yes      Yes                  Age Indeterminate +---------+---------------+---------+-----------+----------+-----------------+ PTV      Full                                                           +---------+---------------+---------+-----------+----------+-----------------+ PERO     None                                         Age Indeterminate +---------+---------------+---------+-----------+----------+-----------------+ Gastroc  Partial                                      Age Indeterminate +---------+---------------+---------+-----------+----------+-----------------+ EIV                     No       No                   Age Indeterminate +---------+---------------+---------+-----------+----------+-----------------+  A stent is noted in the common femoral vein. Unable to assess the CIV, or the IVC due to the patient's positioning and movement.    Summary: RIGHT: - No evidence of common femoral vein obstruction.  LEFT: - Findings consistent with age indeterminate deep vein thrombosis involving the left, external iliac vein, left common femoral vein, left femoral vein, left popliteal vein, left peroneal veins, and left gastrocnemius veins. - No cystic structure found in the popliteal fossa.  *See table(s) above for measurements and observations.    Preliminary     EKG: Independently reviewed.   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 Continue heparin infusion. Monitor PTT and platelet count closely. Continue home analgesics. Hydromorphone 1 mg IVP every 2 h PRN. Hematology/oncology will evaluate in the morning.  Active Problems:   Thrombocytopenia ([place]) In the setting of metastatic bladder CA. Monitor closely while on heparin.    Leukocytosis No fever or chills. Likely inflammatory from neoplasm. Follow WBC.   Essential hypertension Monitor blood pressure.    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 Oncology will evaluate in AM.    IDA (iron deficiency anemia) Monitor hematocrit and hemoglobin. Transfuse as needed.    Mild protein malnutrition (HCC) Begin protein supplementation.    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   DVT prophylaxis: On heparin infusion. Code Status:   Full code. Family Communication:  His wife was present in the ED room. Disposition Plan:   Patient is from:  Home.  Anticipated DC to:  Home.  Anticipated DC date:  02/27/2021.  Anticipated DC barriers: Clinical status.  Consults called:  Dr. Marin Olp will be consulting tomorrow. Admission status:  Observation/telemetry.   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  The patient will remain to be treated with heparin infusion and parenteral analgesics  as needed.  Oncology will evaluate.  Reubin Milan MD [facility]  How to contact the [facility] Attending or Consulting provider [place] or covering provider during after hours [place], for this patient?   Check the care team in [facility] and look for a) attending/consulting [facility] provider listed and b) the [facility] team listed Log into [email or web address] and use [place]'s universal password to access. If you do not have the password, please contact the hospital operator. Locate the [facility] provider you are looking for under [facility] and page to a number that you can be directly reached. If you still have difficulty reaching the provider, please page the [facility] (Director on Call) for the Hospitalists listed on amion for assistance.  02/25/2021, 10:52 PM   This document was prepared using Dragon voice recognition software and may contain some unintended transcription errors.

## 2021-02-25 NOTE — Telephone Encounter (Signed)
Call received from patient's wife Suzi stating that patient's status has dropped significantly since returning home from this office yesterday and that he has not been able to sleep and having difficulty sitting d/t horrible pain that is not relieved by any of the medicines he has at home.  Pt.'s wife instructed to take pt to the [facility] ER now per order of Dr. Marin Olp. Pt.'s wife states that she will take pt to the [facility] ER now.  Dr. Marin Olp notified.

## 2021-02-25 NOTE — Progress Notes (Signed)
[place] for Heparin, PTA Apixaban '5mg'$  bid Indication: Hx   No Known Allergies  Patient Measurements: Height: 6' (182.9 cm) Weight: 77.1 kg (170 lb) IBW/kg (Calculated) : 77.6 Heparin Dosing Weight: 77.1 kg  Vital Signs: Temp: 98.6 F (37 C) (09/15 1643) Temp Source: Oral (09/15 1643) BP: 132/77 (09/15 1643) Pulse Rate: 84 (09/15 1643)  Labs: Recent Labs    02/24/21 1047 02/25/21 1639  HGB 10.1*  --   HCT 30.8*  --   PLT 73*  --   CREATININE 1.01 0.98   Estimated Creatinine Clearance: 80.9 mL/min (by C-G formula based on SCr of 0.98 mg/dL).  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 Plan CT of abd/pelvis with contrast & L spine - r/o retroperitoneal bleed, hx of DVT on Apixaban '5mg'$  bid, last dose 9/15 at 0830 am. MRI of lumbar spine ordered.  LLE doppler with age-indeterminate VTE  CT results: complete occlusion of the indwelling left iliofemoral venous stent with interval increase of an almost occlusive inferior vena cava thrombus. Begin Heparin, no loading dose  Plt low at 48K  Goal of Therapy:  Heparin level 0.3-0.7 units/ml aPTT 66-102 seconds Monitor platelets by anticoagulation protocol: Yes   Plan:  Begin Heparin infusion at 1400 units/hr Daily CBC Baseline aPTT & Heparin level ordered, draw before starting Heparin, do not need to wait for results to begin Anticipate will use only aPTT initially for dosing, Apixaban will increase heparin levels - transition to heparin levels when  aptt and hep levels correlate Check aPTT 6 hr after Heparin started  Minda Ditto PharmD 02/25/2021,5:27 PM

## 2021-02-26 ENCOUNTER — Encounter (HOSPITAL_COMMUNITY): Payer: Self-pay | Admitting: Internal Medicine

## 2021-02-26 ENCOUNTER — Inpatient Hospital Stay (HOSPITAL_COMMUNITY): Payer: 59

## 2021-02-26 ENCOUNTER — Observation Stay (HOSPITAL_COMMUNITY): Payer: 59

## 2021-02-26 DIAGNOSIS — I82220 Acute embolism and thrombosis of inferior vena cava: Secondary | ICD-10-CM | POA: Diagnosis present

## 2021-02-26 DIAGNOSIS — I82412 Acute embolism and thrombosis of left femoral vein: Secondary | ICD-10-CM | POA: Diagnosis present

## 2021-02-26 DIAGNOSIS — T82898A Other specified complication of vascular prosthetic devices, implants and grafts, initial encounter: Secondary | ICD-10-CM | POA: Diagnosis present

## 2021-02-26 DIAGNOSIS — M25552 Pain in left hip: Secondary | ICD-10-CM | POA: Diagnosis present

## 2021-02-26 DIAGNOSIS — K219 Gastro-esophageal reflux disease without esophagitis: Secondary | ICD-10-CM | POA: Diagnosis present

## 2021-02-26 DIAGNOSIS — Z86718 Personal history of other venous thrombosis and embolism: Secondary | ICD-10-CM | POA: Diagnosis not present

## 2021-02-26 DIAGNOSIS — I82432 Acute embolism and thrombosis of left popliteal vein: Secondary | ICD-10-CM | POA: Diagnosis present

## 2021-02-26 DIAGNOSIS — K5900 Constipation, unspecified: Secondary | ICD-10-CM | POA: Diagnosis present

## 2021-02-26 DIAGNOSIS — C679 Malignant neoplasm of bladder, unspecified: Secondary | ICD-10-CM | POA: Diagnosis not present

## 2021-02-26 DIAGNOSIS — D638 Anemia in other chronic diseases classified elsewhere: Secondary | ICD-10-CM | POA: Diagnosis present

## 2021-02-26 DIAGNOSIS — D509 Iron deficiency anemia, unspecified: Secondary | ICD-10-CM | POA: Diagnosis present

## 2021-02-26 DIAGNOSIS — I82422 Acute embolism and thrombosis of left iliac vein: Secondary | ICD-10-CM

## 2021-02-26 DIAGNOSIS — C799 Secondary malignant neoplasm of unspecified site: Secondary | ICD-10-CM | POA: Diagnosis not present

## 2021-02-26 DIAGNOSIS — E785 Hyperlipidemia, unspecified: Secondary | ICD-10-CM | POA: Diagnosis present

## 2021-02-26 DIAGNOSIS — Z66 Do not resuscitate: Secondary | ICD-10-CM | POA: Diagnosis not present

## 2021-02-26 DIAGNOSIS — D696 Thrombocytopenia, unspecified: Secondary | ICD-10-CM | POA: Diagnosis present

## 2021-02-26 DIAGNOSIS — Z833 Family history of diabetes mellitus: Secondary | ICD-10-CM | POA: Diagnosis not present

## 2021-02-26 DIAGNOSIS — C786 Secondary malignant neoplasm of retroperitoneum and peritoneum: Secondary | ICD-10-CM | POA: Diagnosis present

## 2021-02-26 DIAGNOSIS — C7951 Secondary malignant neoplasm of bone: Secondary | ICD-10-CM | POA: Diagnosis present

## 2021-02-26 DIAGNOSIS — L89313 Pressure ulcer of right buttock, stage 3: Secondary | ICD-10-CM | POA: Diagnosis present

## 2021-02-26 DIAGNOSIS — Z515 Encounter for palliative care: Secondary | ICD-10-CM | POA: Diagnosis not present

## 2021-02-26 DIAGNOSIS — D72829 Elevated white blood cell count, unspecified: Secondary | ICD-10-CM | POA: Diagnosis not present

## 2021-02-26 DIAGNOSIS — Y713 Surgical instruments, materials and cardiovascular devices (including sutures) associated with adverse incidents: Secondary | ICD-10-CM | POA: Diagnosis present

## 2021-02-26 DIAGNOSIS — G893 Neoplasm related pain (acute) (chronic): Secondary | ICD-10-CM | POA: Diagnosis present

## 2021-02-26 DIAGNOSIS — Z7189 Other specified counseling: Secondary | ICD-10-CM | POA: Diagnosis not present

## 2021-02-26 DIAGNOSIS — K5903 Drug induced constipation: Secondary | ICD-10-CM | POA: Diagnosis not present

## 2021-02-26 DIAGNOSIS — M549 Dorsalgia, unspecified: Secondary | ICD-10-CM | POA: Diagnosis present

## 2021-02-26 DIAGNOSIS — Z8551 Personal history of malignant neoplasm of bladder: Secondary | ICD-10-CM | POA: Diagnosis not present

## 2021-02-26 DIAGNOSIS — R52 Pain, unspecified: Secondary | ICD-10-CM | POA: Diagnosis not present

## 2021-02-26 DIAGNOSIS — E860 Dehydration: Secondary | ICD-10-CM | POA: Diagnosis not present

## 2021-02-26 DIAGNOSIS — E441 Mild protein-calorie malnutrition: Secondary | ICD-10-CM | POA: Diagnosis present

## 2021-02-26 DIAGNOSIS — I82452 Acute embolism and thrombosis of left peroneal vein: Secondary | ICD-10-CM | POA: Diagnosis present

## 2021-02-26 DIAGNOSIS — Z806 Family history of leukemia: Secondary | ICD-10-CM | POA: Diagnosis not present

## 2021-02-26 DIAGNOSIS — I10 Essential (primary) hypertension: Secondary | ICD-10-CM | POA: Diagnosis present

## 2021-02-26 LAB — BASIC METABOLIC PANEL
Anion gap: 13 (ref 5–15)
BUN: 30 mg/dL — ABNORMAL HIGH (ref 8–23)
CO2: 22 mmol/L (ref 22–32)
Calcium: 8.6 mg/dL — ABNORMAL LOW (ref 8.9–10.3)
Chloride: 99 mmol/L (ref 98–111)
Creatinine, Ser: 0.86 mg/dL (ref 0.61–1.24)
GFR, Estimated: >60 mL/min (ref >60)
Glucose, Bld: 130 mg/dL — ABNORMAL HIGH (ref 70–99)
Potassium: 3.4 mmol/L — ABNORMAL LOW (ref 3.5–5.1)
Sodium: 134 mmol/L — ABNORMAL LOW (ref 135–145)

## 2021-02-26 LAB — CBC
HCT: 27 % — ABNORMAL LOW (ref 39.0–52.0)
Hemoglobin: 8.8 g/dL — ABNORMAL LOW (ref 13.0–17.0)
MCH: 31.8 pg (ref 26.0–34.0)
MCHC: 32.6 g/dL (ref 30.0–36.0)
MCV: 97.5 fL (ref 80.0–100.0)
Platelets: 49 10*3/uL — ABNORMAL LOW (ref 150–400)
RBC: 2.77 MIL/uL — ABNORMAL LOW (ref 4.22–5.81)
RDW: 21.3 % — ABNORMAL HIGH (ref 11.5–15.5)
WBC: 17.6 10*3/uL — ABNORMAL HIGH (ref 4.0–10.5)
nRBC: 0 % (ref 0.0–0.2)

## 2021-02-26 LAB — PROCALCITONIN: Procalcitonin: 0.54 ng/mL

## 2021-02-26 LAB — APTT
aPTT: 117 seconds — ABNORMAL HIGH (ref 24–36)
aPTT: 162 seconds (ref 24–36)
aPTT: 104 seconds — ABNORMAL HIGH (ref 24–36)

## 2021-02-26 LAB — HEPARIN LEVEL (UNFRACTIONATED)
Heparin Unfractionated: 1.05 IU/mL — ABNORMAL HIGH (ref 0.30–0.70)
Heparin Unfractionated: 1.03 IU/mL — ABNORMAL HIGH (ref 0.30–0.70)

## 2021-02-26 MED ORDER — SODIUM CHLORIDE 0.9% FLUSH: 10.0000 mL | INTRAVENOUS | Status: DC | PRN

## 2021-02-26 MED ORDER — DEXAMETHASONE SODIUM PHOSPHATE 10 MG/ML IJ SOLN
20.0000 mg | Freq: Once | INTRAMUSCULAR | Status: AC
  Administered 2021-02-26: 20 mg via INTRAVENOUS
  Filled 2021-02-26 (×2): qty 2

## 2021-02-26 MED ORDER — POLYETHYLENE GLYCOL 3350 17 G PO PACK
17.0000 g | PACK | Freq: Two times a day (BID) | ORAL | Status: DC
  Administered 2021-02-27 – 2021-03-08 (×13): 17 g via ORAL
  Filled 2021-02-26 (×20): qty 1

## 2021-02-26 MED ORDER — IPRATROPIUM-ALBUTEROL 0.5-2.5 (3) MG/3ML IN SOLN: 3.0000 mL | RESPIRATORY_TRACT | Status: DC | PRN

## 2021-02-26 MED ORDER — SODIUM CHLORIDE 0.9% FLUSH
10.0000 mL | Freq: Two times a day (BID) | INTRAVENOUS | Status: DC
  Administered 2021-02-26 – 2021-03-08 (×14): 10 mL

## 2021-02-26 MED ORDER — HYDRALAZINE HCL 20 MG/ML IJ SOLN: 10.0000 mg | INTRAMUSCULAR | Status: DC | PRN

## 2021-02-26 MED ORDER — GUAIFENESIN 100 MG/5ML PO SOLN: 5.0000 mL | ORAL | Status: DC | PRN

## 2021-02-26 MED ORDER — METOPROLOL TARTRATE 5 MG/5ML IV SOLN: 5.0000 mg | INTRAVENOUS | Status: DC | PRN

## 2021-02-26 MED ORDER — POTASSIUM CHLORIDE 10 MEQ/100ML IV SOLN
10.0000 meq | INTRAVENOUS | Status: AC
  Administered 2021-02-26: 10 meq via INTRAVENOUS
  Filled 2021-02-26 (×2): qty 100

## 2021-02-26 MED ORDER — HEPARIN (PORCINE) 25000 UT/250ML-% IV SOLN
1300.0000 [IU]/h | INTRAVENOUS | Status: DC
  Administered 2021-02-26: 1300 [IU]/h via INTRAVENOUS
  Filled 2021-02-26 (×2): qty 250

## 2021-02-26 MED ORDER — TRAZODONE HCL 50 MG PO TABS
50.0000 mg | ORAL_TABLET | Freq: Every evening | ORAL | Status: DC | PRN
  Administered 2021-02-27 – 2021-03-08 (×6): 50 mg via ORAL
  Filled 2021-02-26 (×9): qty 1

## 2021-02-26 MED ORDER — POTASSIUM CHLORIDE 10 MEQ/100ML IV SOLN
10.0000 meq | INTRAVENOUS | Status: AC
  Administered 2021-02-26 (×2): 10 meq via INTRAVENOUS
  Filled 2021-02-26: qty 100

## 2021-02-26 MED ORDER — LIDOCAINE-PRILOCAINE 2.5-2.5 % EX CREA
TOPICAL_CREAM | Freq: Once | CUTANEOUS | Status: AC
  Administered 2021-02-26: 1 via TOPICAL
  Filled 2021-02-26: qty 5

## 2021-02-26 MED ORDER — HEPARIN (PORCINE) 25000 UT/250ML-% IV SOLN
1050.0000 [IU]/h | INTRAVENOUS | Status: DC
  Filled 2021-02-26 (×3): qty 250

## 2021-02-26 MED ORDER — LORAZEPAM 1 MG PO TABS
1.0000 mg | ORAL_TABLET | Freq: Four times a day (QID) | ORAL | Status: DC | PRN
  Administered 2021-02-28 – 2021-03-01 (×3): 1 mg via ORAL
  Filled 2021-02-26 (×3): qty 1

## 2021-02-26 MED ORDER — CHLORHEXIDINE GLUCONATE CLOTH 2 % EX PADS
6.0000 | MEDICATED_PAD | Freq: Every day | CUTANEOUS | Status: DC
  Administered 2021-02-26 – 2021-03-08 (×11): 6 via TOPICAL

## 2021-02-26 MED ORDER — HYDROMORPHONE HCL 1 MG/ML IJ SOLN
1.0000 mg | INTRAMUSCULAR | Status: DC | PRN
Start: 2021-02-26 — End: 2021-02-26
  Administered 2021-02-26 (×4): 1 mg via INTRAVENOUS
  Filled 2021-02-26 (×4): qty 1

## 2021-02-26 MED ORDER — PROCHLORPERAZINE MALEATE 10 MG PO TABS: 10.0000 mg | ORAL_TABLET | Freq: Four times a day (QID) | ORAL | Status: DC | PRN

## 2021-02-26 MED ORDER — HYDROMORPHONE HCL 1 MG/ML IJ SOLN
2.0000 mg | INTRAMUSCULAR | Status: DC | PRN
Start: 2021-02-26 — End: 2021-02-28
  Administered 2021-02-26 – 2021-02-28 (×6): 2 mg via INTRAVENOUS
  Filled 2021-02-26 (×6): qty 2

## 2021-02-26 MED ORDER — DEXAMETHASONE SODIUM PHOSPHATE 4 MG/ML IJ SOLN: 20.0000 mg | Freq: Once | INTRAMUSCULAR | Status: DC

## 2021-02-26 NOTE — Progress Notes (Addendum)
[place] for Heparin Indication: L iliofemoral DVT with occlusion of IVC  No Known Allergies  Patient Measurements: Height: 6' (182.9 cm) Weight: 77.1 kg (170 lb) IBW/kg (Calculated) : 77.6 Heparin Dosing Weight: 77.1 kg  Vital Signs: Temp: 98.2 F (36.8 C) (09/16 1434) Temp Source: Oral (09/16 1019) BP: 123/71 (09/16 1434) Pulse Rate: 98 (09/16 1434)  Labs: Recent Labs    02/24/21 1047 02/25/21 1639 02/25/21 2255 02/26/21 0515 02/26/21 0656 02/26/21 1519  HGB 10.1* 9.8*  --  8.8*  --   --   HCT 30.8* 30.6*  --  27.0*  --   --   PLT 73* 48*  --  49*  --   --   APTT  --   --  40*  --  117*  --   LABPROT  --   --  15.2  --   --   --   INR  --   --  1.2  --   --   --   HEPARINUNFRC  --   --  >1.10*  --  1.05* 1.03*  CREATININE 1.01 0.98  --  0.86  --   --     Estimated Creatinine Clearance: 92.1 mL/min (by C-G formula based on SCr of 0.86 mg/dL).  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  Pltc low at 48K.  Orders received to begin IV heparin (no loading/bolus dose). Note that recent DOAC therapy can falsely elevate heparin levels, so will need to use aPTT for heparin rate titration until aPTT correlates with heparin level.  02/26/2021: 1519 heparin level = 1.03 units/mL, remains falsely elevated due to recent DOAC use 1519 aPTT = 162 seconds, supratherapeutic and increased despite decrease in heparin rate earlier today Per RN, heparin  infusing in left forearm and heparin level/aPTT were drawn from port (right chest) Confirmed with RN that heparin running at specified rate of 1300 units/hr Reordered aPTT as peripheral stick to check accuracy. Repeat aPTT = 104 seconds, now only slightly supratherapeutic No bleeding reported. CBC: Hgb low/decreased to 8.8. Platelet count remains low but stable at 49K. Concomitant low-dose ASA noted.  Goal of Therapy:  aPTT 66 -102 seconds  Heparin level 0.3-0.7 units/mL Monitor platelets by anticoagulation protocol: Yes   Plan:  Reduce IV heparin infusion to 1200 units/hr aPTT 6 hours after rate change (peripheral stick in opposite arm from where heparin infusing) Daily CBC (watch Pltc), aPTT, heparin level Monitor closely for s/x of bleeding F/u IR plans   Lindell Spar, PharmD, [place] [phone] 02/26/2021  4:25 PM

## 2021-02-26 NOTE — Progress Notes (Signed)
[place] for Heparin Indication: L iliofemoral DVT  No Known Allergies  Patient Measurements: Height: 6' (182.9 cm) Weight: 77.1 kg (170 lb) IBW/kg (Calculated) : 77.6 Heparin Dosing Weight: 77.1 kg  Vital Signs: Temp: 98.4 F (36.9 C) (09/16 0600) Temp Source: Oral (09/16 0559) BP: 155/81 (09/16 0559) Pulse Rate: 92 (09/16 0559)  Labs: Recent Labs    02/24/21 1047 02/25/21 1639 02/25/21 2255 02/26/21 0515 02/26/21 0656  HGB 10.1* 9.8*  --  8.8*  --   HCT 30.8* 30.6*  --  27.0*  --   PLT 73* 48*  --  49*  --   APTT  --   --  40*  --  117*  LABPROT  --   --  15.2  --   --   INR  --   --  1.2  --   --   HEPARINUNFRC  --   --  >1.10*  --  1.05*  CREATININE 1.01 0.98  --  0.86  --     Estimated Creatinine Clearance: 92.1 mL/min (by C-G formula based on SCr of 0.86 mg/dL).  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 ([place]) 03/06/2020   Medications:  Scheduled:   aspirin EC  81 mg Oral Daily   DULoxetine  30 mg Oral QHS   Followed by   Derrill Memo ON 03/01/2021] DULoxetine  60 mg Oral QHS   fentaNYL  1 patch Transdermal Q48H   pantoprazole  40 mg Oral BID   polyethylene glycol  17 g Oral BID   pregabalin  50 mg Oral BID   Followed by   Derrill Memo ON 03/01/2021] pregabalin  50 mg Oral TID   Infusions:   sodium chloride Stopped (02/25/21 2247)   heparin     potassium chloride      Assessment: 65 y/o M with hx of LLE DVT, on apixaban 5 mg BID PTA, on chemotherapy for metastatic bladder cancer, presented with L hip pain and on CT was found to have complete occlusion of indwelling L iliofemoral venous stent with interval increase of an almost occlusive inferior vena cava thrombus.  Pltc low at 48K.  Orders received to begin IV heparin (no loading dose).   Given  recent DOAC therapy will need to use APTT for heparin titration until DOAC levels fall and APTT correlates with anti-Xa heparin level.  02/26/2021: APTT supratherapeutic at 117  Heparin level 1.05 (affected by PTA apixaban therapy)  Currently on Heparin 1400 units/hr. RN states she received report that patient initially came from ED with Heparin running at 1500 units/hr.  No bleeding reported.  Platelet count remains low but stable 49K. Concomitant low-dose ASA noted.   Goal of Therapy:  aPTT 66 -102 seconds while awaiting DOAC levels to fall Switch to heparin level monitoring when values correlate with aPTT. Monitor platelets by anticoagulation protocol: Yes   Plan:  Reduce Heparin to 1300 units/hr Repeat aPTT and heparin level at 2:30 pm CBC daily - follow [place], PharmD, [place] [phone] 02/26/2021  8:32 AM

## 2021-02-26 NOTE — Progress Notes (Addendum)
Patient had a Fentanyl 100 mg patch on his lower left back. It was removed and wasted with Nash Dimmer, RN in the sharps.

## 2021-02-26 NOTE — Progress Notes (Addendum)
Referring Physician(s): Ennever,P  Supervising Physician: Juliet Rude  Patient Status:  [facility] - In-pt  Chief Complaint: Left hip/thigh pain,swelling  Subjective: Patient known to IR service from left hemipelvis mass biopsy 2021, Port-A-Cath placement in 2021, IVC  filter placement/retrieval , balloon angioplasty of thrombosed left iliofemoral venous stents, left iliac vein stent relining, IVC balloon angioplasty on 06/08/2020 followed by catheter and wire recanalization of occluded indwelling left iliofemoral stents.  On 06/17/2020 he underwent left lower extremity angiogram, aspiration thrombectomy of acute thrombus of left iliac stent system and prolonged balloon angioplasty of the occluded left femoral vein restoring inflow through the iliofemoral venous system to the IVC.  He has a known history of metastatic bladder cancer with prior TURBT, hyperlipidemia, hypertension, anemia, thrombocytopenia.  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  He is currently on IV heparin.  Request now received for recommendations to decrease clot burden.  Patient states that since he was started on IV heparin the edema in his left leg has decreased significantly.  He still continues to have some discomfort in left hip and thigh region.   Allergies: Patient has no known allergies.  Medications: Prior to Admission medications   Medication Sig Start Date End Date Taking? Authorizing Provider  acetaminophen (TYLENOL) 500 MG tablet Take 500 mg by mouth every 6 (six) hours as needed for moderate pain.   Yes [provider]  apixaban (ELIQUIS) 5 MG TABS tablet TAKE 1 TABLET (5 MG TOTAL) BY MOUTH 2 (TWO)  TIMES DAILY. 02/02/21 02/02/22 Yes Ennever, Rudell Cobb, MD  ASPIRIN LOW DOSE 81 MG EC tablet TAKE 1 TABLET BY MOUTH EVERY DAY 10/12/20  Yes Patwardhan, Manish J, MD  DULoxetine (CYMBALTA) 30 MG capsule Take 30-60 mg by mouth as directed. Take 1 capsule (30 mg) at bedtime for 7 Days Then Take 2 capsules (60 mg) at bedtime   Yes [provider]  fentaNYL (DURAGESIC) 100 MCG/HR Place 1 patch onto the skin every other day. 02/10/21  Yes Ennever, Rudell Cobb, MD  lactulose, encephalopathy, (CHRONULAC) 10 GM/15ML SOLN TAKE 30MLS BY MOUTH EVERY 4 HOURS AS NEEDED Patient taking differently: Take 30 g by mouth every 4 (four) hours as needed. 06/26/20 06/26/21 Yes Ennever, Rudell Cobb, MD  lidocaine-prilocaine (EMLA) cream Apply to affected area once Patient taking differently: Apply 1 application topically as directed. Access port 01/13/21  Yes Ennever, Rudell Cobb, MD  Multiple Vitamins-Minerals (MULTIVITAMIN WITH MINERALS) tablet Take 1 tablet by mouth every other day.   Yes [provider]  naloxone (NARCAN) nasal spray 4 mg/0.1 mL Place 1 spray into the nose once. 02/22/21  Yes [provider]  oxyCODONE (OXYCONTIN) 10 mg 12 hr tablet Take 1 tablet (10 mg total) by mouth every 4 (four) hours as needed. 01/21/21  Yes Ennever, Rudell Cobb, MD  Oxycodone HCl 10 MG TABS TAKE 1 TABLET BY MOUTH EVERY 4 HOURS AS NEEDED Patient taking differently: Take 10-20 mg by mouth every 4 (four) hours as needed (pain). 02/22/21 08/21/21 Yes Ennever, Rudell Cobb, MD  pantoprazole (PROTONIX) 40 MG tablet Take 1 tablet (40 mg total) by mouth 2 (two) times daily. 01/13/21 01/13/22 Yes Ennever, Rudell Cobb, MD  polyethylene glycol [facility] / Floria Raveling)  17 g packet Take 17 g by mouth daily.   Yes [provider]  pregabalin (LYRICA) 50 MG capsule Take 50 mg by mouth as directed. Take 1 capsule (50 mg) at bedtime for 3 nights, Take 1 capsule (50 mg) BID for 3 Days, Then Take 1 capsule (50 mg) TID 02/22/21  Yes [provider]   sodium chloride (OCEAN) 0.65 % SOLN nasal spray Place 1 spray into both nostrils daily as needed for congestion (Allergies).   Yes [provider]  traMADol (ULTRAM) 50 MG tablet TAKE 2 TABLETS BY MOUTH EVERY 6 HOURS AS NEEDED. Patient taking differently: Take 100 mg by mouth every 6 (six) hours as needed for moderate pain. 02/08/21  Yes Ennever, Rudell Cobb, MD  dexamethasone (DECADRON) 4 MG tablet Take 2 tablets (8 mg total) by mouth daily. Start the day after chemotherapy for 2 days. Patient not taking: No sig reported 01/13/21   Volanda Napoleon, MD  dexamethasone (DECADRON) 6 MG tablet Take 3 tablets (18 mg total) by mouth daily. **You MUST take with food in the morning.** Patient not taking: No sig reported 01/13/21   Volanda Napoleon, MD  gabapentin (NEURONTIN) 600 MG tablet TAKE 1-2 TABLETS (600 MG TOTAL) BY MOUTH IN THE MORNING, AT NOON, IN THE EVENING,AND AT BEDTIME Patient not taking: No sig reported 12/23/20   Volanda Napoleon, MD  gabapentin (NEURONTIN) 600 MG tablet Take 2 tablets (1,200 mg total) by mouth in the morning, at noon, in the evening, and at bedtime. Patient not taking: No sig reported 01/05/21 01/05/22  Volanda Napoleon, MD  HYDROmorphone (DILAUDID) 8 MG tablet Take 1 tablet (8 mg total) by mouth every 6 (six) hours as needed for severe pain. Patient not taking: No sig reported 01/13/21   Volanda Napoleon, MD  LORazepam (ATIVAN) 0.5 MG tablet Take 1 tablet (0.5 mg total) by mouth every 6 (six) hours as needed (Nausea or vomiting). Patient not taking: No sig reported 01/13/21   Volanda Napoleon, MD  LORazepam (ATIVAN) 0.5 MG tablet Take 2 tablets (1 mg total) by mouth every 6 (six) hours as needed for anxiety. Patient not taking: No sig reported 02/16/21 08/15/21  Volanda Napoleon, MD  meloxicam (MOBIC) 15 MG tablet TAKE 1 TABLET (15 MG TOTAL) BY MOUTH DAILY. Patient not taking: No sig reported 02/22/21 02/22/22  Volanda Napoleon, MD  ondansetron (ZOFRAN) 8 MG tablet Take 1 tablet  (8 mg total) by mouth 2 (two) times daily as needed for refractory nausea / vomiting. Start on day 3 after chemo. 01/13/21   Volanda Napoleon, MD  ondansetron (ZOFRAN-ODT) 8 MG disintegrating tablet TAKE 1 TABLET BY MOUTH EVERY 8 HOURS AS NEEDED FOR NAUSEA & VOMITING 09/14/20 09/14/21  Volanda Napoleon, MD  orphenadrine (NORFLEX) 100 MG tablet TAKE 1 TABLET (100 MG TOTAL) BY MOUTH 2 (TWO) TIMES DAILY. Patient not taking: Reported on 02/25/2021 11/03/20   Volanda Napoleon, MD  prochlorperazine (COMPAZINE) 10 MG tablet TAKE 1 TABLET (10 MG TOTAL) BY MOUTH EVERY 6 (SIX) HOURS AS NEEDED FOR NAUSEA OR VOMITING. Patient not taking: No sig reported 09/18/20 09/18/21  Volanda Napoleon, MD  prochlorperazine (COMPAZINE) 10 MG tablet Take 1 tablet (10 mg total) by mouth every 6 (six) hours as needed (Nausea or vomiting). Patient not taking: No sig reported 01/13/21   Volanda Napoleon, MD  temazepam (RESTORIL) 15 MG capsule Take 1 capsule (15 mg total) by mouth at bedtime as needed for  sleep. Patient not taking: Reported on 02/25/2021 02/02/21   Volanda Napoleon, MD  enoxaparin (LOVENOX) 80 MG/0.8ML injection Inject 0.8 mLs (80 mg total) into the skin every 12 (twelve) hours. 05/20/20 07/29/20  Volanda Napoleon, MD     Vital Signs: BP 123/71 (BP Location: Right Arm)   Pulse 98   Temp 98.2 F (36.8 C)   Resp 16   Ht 6' (1.829 m)   Wt 170 lb (77.1 kg)   SpO2 100%   BMI 23.06 kg/m   Physical Exam awake, alert. Chest- CTA bilat; heart- RRR; abd- soft,+BS,NT;  patient with only minimal edema of left lower extremity.  Left thigh area is also soft and not tense at this time.  Some tenderness noted in posterior left thigh region.  Imaging: CT ABDOMEN PELVIS W CONTRAST  Result Date: 02/25/2021 CLINICAL DATA:  Urologic cancer, staging bladder cancer with mets to bone. Prior PET-CT demonstrates Stable left retroperitoneal metastasis along the left anterior aspect of the lumbosacral junction involving the left iliopsoas  musculature.  MRI demonstrates left sacral metastasis. EXAM: CT ABDOMEN AND PELVIS WITH CONTRAST CT LUMBAR SPINE WITHOUT CONTRAST TECHNIQUE: Multidetector CT imaging of the abdomen and pelvis was performed using the standard protocol following bolus administration of intravenous contrast. Multidetector CT imaging of the lumbar spine was performed without intravenous contrast administration. Multiplanar CT image reconstructions were also generated. CONTRAST:  64m OMNIPAQUE IOHEXOL 350 MG/ML SOLN COMPARISON:  PET-CT 09/29/2020, MRI lumbar spine 01/04/2021, ct abdomen/pelvis 05/18/20 FINDINGS: CT abdomen/pelvis: Lower chest: Linear atelectasis versus scarring of the right lower lobe. Limited evaluation due to motion artifact. No acute abnormality. Hepatobiliary: No focal liver abnormality. No gallstones, gallbladder wall thickening, or pericholecystic fluid. No biliary dilatation. Pancreas: No focal lesion. Normal pancreatic contour. No surrounding inflammatory changes. No main pancreatic ductal dilatation. Spleen: Normal in size without focal abnormality. Adrenals/Urinary Tract: No adrenal nodule bilaterally. Bilateral kidneys enhance symmetrically. There is a 1.7 cm fluid density lesion within the right kidney that likely represents a simple renal cyst. Similar findings are noted within the left kidney measuring to 2.3 cm. Subcentimeter hypodensities are too small to characterize. No hydronephrosis. No hydroureter. Circumferential urinary bladder wall thickening. Stomach/Bowel: Stomach is within normal limits. No evidence of bowel wall thickening or dilatation. Stool throughout the colon. Appendix appears normal. 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 There is a left common iliac to external iliac to femoral vein stent. No abdominal aorta or iliac aneurysm.  Severe calcified and noncalcified atherosclerotic plaque of the aorta and its branches. No abdominal, pelvic, or inguinal lymphadenopathy. Reproductive: Prostate is unremarkable. Other: No intraperitoneal free fluid. No intraperitoneal free gas. No organized fluid collection. Musculoskeletal: Similar-appearing approximately 6 cm soft tissue density along the iliopsoas muscle with associated poorly visualized erosion of the left sacral ala (2:57). No suspicious lytic or blastic osseous lesions. No acute displaced fracture. Multilevel degenerative changes of the spine. CT lumbar spine: Segmentation: 5 lumbar type vertebrae. Alignment: Normal. Vertebrae: Mild degenerative changes. No acute fracture or focal pathologic process. Paraspinal and other soft tissues: Negative. IMPRESSION: 1. Limited evaluation of the upper abdomen, pelvis, spine due to motion artifact. 2. 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 3. Circumferential urinary bladder wall thickening. 4. Similar-appearing known left sacral al a metastasis/erosion with associated  iliopsoas soft tissue density. 5. Constipation. 6. No acute displaced fracture or traumatic listhesis of the lumbar spine. Electronically Signed   By: Iven Finn M.D.   On: 02/25/2021 20:51   CT L-SPINE NO CHARGE  Result Date: 02/25/2021 CLINICAL DATA:  Urologic cancer, staging bladder cancer with mets to bone. Prior PET-CT demonstrates Stable left retroperitoneal metastasis along the left anterior aspect of the lumbosacral junction involving the left iliopsoas musculature.  MRI demonstrates left sacral metastasis. EXAM: CT ABDOMEN AND PELVIS WITH CONTRAST CT LUMBAR SPINE WITHOUT CONTRAST TECHNIQUE: Multidetector CT imaging of the abdomen and pelvis was performed using the standard protocol following bolus administration of intravenous contrast. Multidetector CT imaging  of the lumbar spine was performed without intravenous contrast administration. Multiplanar CT image reconstructions were also generated. CONTRAST:  79m OMNIPAQUE IOHEXOL 350 MG/ML SOLN COMPARISON:  PET-CT 09/29/2020, MRI lumbar spine 01/04/2021, ct abdomen/pelvis 05/18/20 FINDINGS: CT abdomen/pelvis: Lower chest: Linear atelectasis versus scarring of the right lower lobe. Limited evaluation due to motion artifact. No acute abnormality. Hepatobiliary: No focal liver abnormality. No gallstones, gallbladder wall thickening, or pericholecystic fluid. No biliary dilatation. Pancreas: No focal lesion. Normal pancreatic contour. No surrounding inflammatory changes. No main pancreatic ductal dilatation. Spleen: Normal in size without focal abnormality. Adrenals/Urinary Tract: No adrenal nodule bilaterally. Bilateral kidneys enhance symmetrically. There is a 1.7 cm fluid density lesion within the right kidney that likely represents a simple renal cyst. Similar findings are noted within the left kidney measuring to 2.3 cm. Subcentimeter hypodensities are too small to characterize. No hydronephrosis. No hydroureter. Circumferential urinary bladder wall thickening. Stomach/Bowel: Stomach is within normal limits. No evidence of bowel wall thickening or dilatation. Stool throughout the colon. Appendix appears normal. 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 There is a left common iliac to external iliac to femoral vein stent. No abdominal aorta or iliac aneurysm. Severe calcified and noncalcified atherosclerotic plaque of the aorta and its branches. No abdominal, pelvic, or inguinal lymphadenopathy. Reproductive: Prostate is unremarkable. Other: No intraperitoneal free fluid. No intraperitoneal free gas. No organized fluid collection. Musculoskeletal: Similar-appearing  approximately 6 cm soft tissue density along the iliopsoas muscle with associated poorly visualized erosion of the left sacral ala (2:57). No suspicious lytic or blastic osseous lesions. No acute displaced fracture. Multilevel degenerative changes of the spine. CT lumbar spine: Segmentation: 5 lumbar type vertebrae. Alignment: Normal. Vertebrae: Mild degenerative changes. No acute fracture or focal pathologic process. Paraspinal and other soft tissues: Negative. IMPRESSION: 1. Limited evaluation of the upper abdomen, pelvis, spine due to motion artifact. 2. 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 3. Circumferential urinary bladder wall thickening. 4. Similar-appearing known left sacral al a metastasis/erosion with associated iliopsoas soft tissue density. 5. Constipation. 6. No acute displaced fracture or traumatic listhesis of the lumbar spine. Electronically Signed   By: MIven FinnM.D.   On: 02/25/2021 20:51   DG Chest Port 1 View  Result Date: 02/26/2021 CLINICAL DATA:  Confusion, weakness EXAM: PORTABLE CHEST 1 VIEW COMPARISON:  03/15/2012 FINDINGS: Right Port-A-Cath in place with the tip in the right atrium. Heart is normal size. Lungs clear. No effusions or acute bony abnormality. IMPRESSION: No active disease. Electronically Signed   By: KRolm BaptiseM.D.   On: 02/26/2021 09:12   DG Hip Unilat  W or Wo Pelvis 2-3 Views Left  Result Date: 02/25/2021 CLINICAL DATA:  Left hip pain EXAM: DG HIP (WITH OR WITHOUT PELVIS) 2-3V LEFT COMPARISON:  None. FINDINGS: Limited study by positioning. The patient would not lie flat for imaging. No definite visible acute bony abnormality. Degenerative changes in the hips bilaterally. IMPRESSION: Limited study by positioning. The patient would not lie flat. No definite visible fracture. Electronically Signed   By: Rolm Baptise M.D.   On: 02/25/2021 15:36    VAS [place] LOWER EXTREMITY VENOUS (DVT) (ONLY [facility] & [facility])  Result Date: 02/25/2021  Lower Venous DVT Study Patient Name:  RICKE STCLAIR  Date of Exam:   02/25/2021 Medical Rec #: [id number]        Accession #:    [id number] Date of Birth: Mar 26, 1956        Patient Gender: M Patient Age:   65 years Exam Location:  [facility] Procedure:      VAS [place] LOWER EXTREMITY VENOUS (DVT) Referring Phys: DAVID YAO --------------------------------------------------------------------------------  Indications: Swelling.  Risk Factors: Cancer DVT. Limitations: Poor ultrasound/tissue interface and patient positioning, patient constant movement, poor patient cooperation. Comparison Study: 05/12/2020 - Sonographic survey of the left lower extremity                   demonstrates stent                   changes of the common femoral vein, with persisting DVT of the                   left                   common femoral vein stent, occlusive. Positive duplex for DVT                   at the                   SFJ, junction of the profunda vein, and through the femoral                   vein                   segment. Performing Technologist: Oliver Hum RVT  Examination Guidelines: A complete evaluation includes B-mode imaging, spectral Doppler, color Doppler, and power Doppler as needed of all accessible portions of each vessel. Bilateral testing is considered an integral part of a complete examination. Limited examinations for reoccurring indications may be performed as noted. The reflux portion of the exam is performed with the patient in reverse Trendelenburg.  +-----+---------------+---------+-----------+----------+--------------+ RIGHTCompressibilityPhasicitySpontaneityPropertiesThrombus Aging +-----+---------------+---------+-----------+----------+--------------+ CFV  Full           Yes      Yes                                 +-----+---------------+---------+-----------+----------+--------------+    +---------+---------------+---------+-----------+----------+-----------------+ LEFT     CompressibilityPhasicitySpontaneityPropertiesThrombus Aging    +---------+---------------+---------+-----------+----------+-----------------+ CFV                     No       No                   Age Indeterminate +---------+---------------+---------+-----------+----------+-----------------+ FV Prox  Partial        Yes      Yes  Age Indeterminate +---------+---------------+---------+-----------+----------+-----------------+ FV Mid   Full                                                           +---------+---------------+---------+-----------+----------+-----------------+ FV DistalNone           No       No                   Age Indeterminate +---------+---------------+---------+-----------+----------+-----------------+ POP      Partial        Yes      Yes                  Age Indeterminate +---------+---------------+---------+-----------+----------+-----------------+ PTV      Full                                                           +---------+---------------+---------+-----------+----------+-----------------+ PERO     None                                         Age Indeterminate +---------+---------------+---------+-----------+----------+-----------------+ Gastroc  Partial                                      Age Indeterminate +---------+---------------+---------+-----------+----------+-----------------+ EIV                     No       No                   Age Indeterminate +---------+---------------+---------+-----------+----------+-----------------+ A stent is noted in the common femoral vein. Unable to assess the CIV, or the IVC due to the patient's positioning and movement.    Summary: RIGHT: - No evidence of common femoral vein obstruction.  LEFT: - Findings consistent with age indeterminate deep vein thrombosis involving the left,  external iliac vein, left common femoral vein, left femoral vein, left popliteal vein, left peroneal veins, and left gastrocnemius veins. - No cystic structure found in the popliteal fossa.  *See table(s) above for measurements and observations.    Preliminary     Labs:  CBC: Recent Labs    02/18/21 0941 02/24/21 1047 02/25/21 1639 02/26/21 0515  WBC 21.7* 20.8* 16.7* 17.6*  HGB 8.3* 10.1* 9.8* 8.8*  HCT 25.7* 30.8* 30.6* 27.0*  PLT 109* 73* 48* 49*    COAGS: Recent Labs    06/01/20 1217 06/08/20 0725 06/17/20 0730 02/25/21 2255 02/26/21 0656  INR 1.1 0.9 0.9 1.2  --   APTT  --   --   --  40* 117*    BMP: Recent Labs    03/02/20 0105 03/03/20 0733 03/04/20 0337 03/11/20 0813 03/18/20 0858 02/18/21 0941 02/24/21 1047 02/25/21 1639 02/26/21 0515  NA 135 134* 134* 134*   < > 132* 133* 139 134*  K 5.1 4.8 4.1 4.8   < > 5.2* 4.3 4.0 3.4*  CL 98 98 100 98   < >  99 99 107 99  CO2 '28 25 26 '$ 32   < > '26 28 26 22  '$ GLUCOSE 119* 121* 131* 94   < > 190* 101* 99 130*  BUN '21 18 13 14   '$ < > 56* 40* 33* 30*  CALCIUM 9.3 9.3 8.7* 10.2   < > 9.2 9.4 9.1 8.6*  CREATININE 0.94 0.74 0.69 0.72   < > 1.28* 1.01 0.98 0.86  GFRNONAA >60 >60 >60 >60   < > >60 >60 >60 >60  GFRAA >60 >60 >60 >60  --   --   --   --   --    < > = values in this interval not displayed.    LIVER FUNCTION TESTS: Recent Labs    02/10/21 1155 02/18/21 0941 02/24/21 1047 02/25/21 1639  BILITOT 0.5 0.5 0.7 1.2  AST 9* 15 12* 19  ALT '14 22 19 22  '$ ALKPHOS 54 54 57 68  PROT 6.2* 5.7* 5.7* 6.2*  ALBUMIN 3.6 3.6 3.7 3.4*    Assessment and Plan: Patient known to IR service from left hemipelvis mass biopsy 2021, Port-A-Cath placement in 2021, IVC  filter placement, balloon angioplasty of thrombosed left iliofemoral venous stents, left iliac vein stent relining, IVC balloon angioplasty on 06/08/2020 followed by catheter and wire recanalization of occluded indwelling left iliofemoral stents.  On 06/17/2020 he  underwent left lower extremity angiogram, aspiration thrombectomy of acute thrombus of left iliac stent system and prolonged balloon angioplasty of the occluded left femoral vein restoring inflow through the iliofemoral venous system to the IVC. He has a known history of metastatic bladder cancer with prior TURBT, hyperlipidemia, hypertension, anemia, thrombocytopenia.  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  He is currently on IV heparin.  Request now received for recommendations to decrease clot burden.  Patient states that since he was started on IV heparin the edema in his left leg has decreased significantly.  He is afebrile, WBC 17.6, platelets 49k, creatinine normal.  Imaging studies and patient were examined by Dr.El- Abd. There appears to be improvement in patient's left lower extremity edema since IV heparin initiation.  He may well have developed significant collateral flow to aid with venous return to the heart.  For now recommend continuation of IV heparin; monitor labs;  case also discussed briefly with Dr. Anselm Pancoast- on call IR attending for the weekend.  He will plan to examine the patient over the weekend and formulate further plans.  Would recommend transfer of patient to [facility] in the interim.   Electronically Signed: D. Rowe Robert, PA-C 02/26/2021, 4:03 PM   I spent a total of 25 minutes at the the patient's bedside AND on the patient's hospital floor or unit, greater than 50% of which was counseling/coordinating care for evaluation of left lower extremity DVT    Patient ID: Brian Benton, male   DOB: 1955/09/17, 65 y.o.   MRN: [id number]

## 2021-02-26 NOTE — Progress Notes (Signed)
PROGRESS NOTE    Brian Benton  [id number] DOB: Jun 10, 1956 DOA: 02/25/2021 PCP: [facility]   Brief Narrative:  65 year old with history of metastatic bladder cancer status post TURBT, HLD, HTN, iron deficiency anemia, thrombocytopenia, tobacco use, left iliofemoral DVT on Eliquis comes to the hospital with worsening left hip pain for 3-4 days.  Lower extremity Doppler and CT abdomen pelvis with contrast showed persistent complete occlusion of indwelling left iliofemoral venous stent with interval increase of occlusive inferior vena cava thrombus.  Circumferential bladder wall thickening.   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  Pain control.  Bowel regimen. - Oncology and IR consulted for their input.  - Hip x-ray negative for obvious fractures.  CT also does not show obvious fracture but does show left sacral alla metastatic erosion  History of metastatic bladder cancer Thrombocytopenia - Oncology team consulted, follows Dr Marin Olp.  Pain control, monitor platelets while on heparin drip  Constipation - Aggressive bowel regimen  Leukocytosis - No evidence of infection.  UA is negative. Will check CXR, order Procal. Hold off on Abx for now.   Anemia of chronic disease - Likely from chemotherapy.  Closely monitor.  No evidence of bleeding.  Essential hypertension -Does not appear to be on home meds.  IV hydralazine as needed  Mild protein calorie malnutrition -Encourage oral intake  Tobacco use -Nicotine patch as needed  Superficial pressure  ulcer on his right buttock area.  Advised nursing staff to put pressure dressing on it for cushioning.   DVT prophylaxis: Currently on heparin drip Code Status: Full Family Communication:  Wife at bedside    Dispo: The patient is from: Home              Anticipated d/c is to: Home              Patient currently is not medically stable to d/c.  Still having significant amount of left hip pain requiring IV Dilaudid   Difficult to place patient No      Subjective: Reporting of significant amount of left-sided hip pain.  Unable to lay flat on that side due to pain.  He has been suffering from this now for several weeks.  Review of Systems Otherwise negative except as per HPI, including: General: Denies fever, chills, night sweats or unintended weight loss. Resp: Denies cough, wheezing, shortness of breath. Cardiac: Denies chest pain, palpitations, orthopnea, paroxysmal nocturnal dyspnea. GI: Denies abdominal pain, nausea, vomiting, diarrhea or constipation GU: Denies dysuria, frequency, hesitancy or incontinence MS: Denies muscle aches, joint pain or swelling Neuro: Denies headache, neurologic deficits (focal weakness, numbness, tingling), abnormal gait Psych: Denies anxiety, depression, SI/HI/AVH Skin: Denies new rashes or lesions ID: Denies sick contacts, exotic exposures, travel  Examination:  General exam: Appears calm and comfortable  Respiratory system: Clear to auscultation. Respiratory effort normal. Cardiovascular system: S1 & S2 heard, RRR. No JVD, murmurs, rubs, gallops or clicks. No pedal edema. Gastrointestinal system: Abdomen is nondistended, soft and nontender. No organomegaly or masses felt. Normal bowel sounds heard. Central nervous  system: Alert and oriented. No focal neurological deficits. Extremities: Symmetric 5 x 5 power. Skin: superficial pressure ulcer on the right buttock area. Psychiatry: Judgement and insight appear normal. Mood & affect appropriate.      Objective: Vitals:   02/26/21 0430 02/26/21 0445 02/26/21 0559 02/26/21 0600  BP: (!) 145/89 (!) 146/84 (!) 155/81   Pulse: 100 98 92   Resp:   (!) 24   Temp:    98.4 F (36.9 C)  TempSrc:   Oral   SpO2: 98% 98%    Weight:      Height:       No intake or output data in the 24 hours ending 02/26/21 0753 Filed Weights   02/25/21 1416 02/25/21 1557  Weight: 77.1 kg 77.1 kg     Data Reviewed:   CBC: Recent Labs  Lab 02/24/21 1047 02/25/21 1639 02/26/21 0515  WBC 20.8* 16.7* 17.6*  NEUTROABS 18.7* 15.5*  --   HGB 10.1* 9.8* 8.8*  HCT 30.8* 30.6* 27.0*  MCV 96.0 99.0 97.5  PLT 73* 48* 49*   Basic Metabolic Panel: Recent Labs  Lab 02/24/21 1047 02/25/21 1639 02/26/21 0515  NA 133* 139 134*  K 4.3 4.0 3.4*  CL 99 107 99  CO2 '28 26 22  '$ GLUCOSE 101* 99 130*  BUN 40* 33* 30*  CREATININE 1.01 0.98 0.86  CALCIUM 9.4 9.1 8.6*   GFR: Estimated Creatinine Clearance: 92.1 mL/min (by C-G formula based on SCr of 0.86 mg/dL). Liver Function Tests: Recent Labs  Lab 02/24/21 1047 02/25/21 1639  AST 12* 19  ALT 19 22  ALKPHOS 57 68  BILITOT 0.7 1.2  PROT 5.7* 6.2*  ALBUMIN 3.7 3.4*   No results for input(s): LIPASE, AMYLASE in the last 168 hours. No results for input(s): AMMONIA in the last 168 hours. Coagulation Profile: Recent Labs  Lab 02/25/21 2255  INR 1.2   Cardiac Enzymes: No results for input(s): CKTOTAL, CKMB, CKMBINDEX, TROPONINI in the last 168 hours. BNP (last 3 results) No results for input(s): PROBNP in the last 8760 hours. HbA1C: No results for input(s): HGBA1C in the last 72 hours. CBG: No results for input(s): GLUCAP in the last 168 hours. Lipid Profile: No results for input(s): CHOL, HDL, LDLCALC, TRIG, CHOLHDL, LDLDIRECT in the last 72 hours. Thyroid Function Tests: No results for input(s): TSH, T4TOTAL, FREET4, T3FREE, THYROIDAB in the last 72 hours. Anemia Panel: No results for input(s): VITAMINB12, FOLATE, FERRITIN, TIBC,  IRON, RETICCTPCT in the last 72 hours. Sepsis Labs: No results for input(s): PROCALCITON, LATICACIDVEN in the last 168 hours.  No results found for this or any previous visit (from the past 240 hour(s)).       Radiology Studies: CT ABDOMEN PELVIS W CONTRAST  Result Date: 02/25/2021 CLINICAL DATA:  Urologic cancer, staging bladder cancer with mets to bone. Prior PET-CT demonstrates Stable left retroperitoneal metastasis along the left anterior aspect of the lumbosacral junction involving the left iliopsoas musculature.  MRI demonstrates left sacral metastasis. EXAM: CT ABDOMEN AND PELVIS WITH CONTRAST CT LUMBAR SPINE WITHOUT CONTRAST TECHNIQUE: Multidetector CT imaging of the abdomen and pelvis was performed using the standard protocol following bolus administration of intravenous contrast. Multidetector CT imaging of the lumbar spine was performed without intravenous contrast administration. Multiplanar CT image reconstructions were also generated. CONTRAST:  94m OMNIPAQUE IOHEXOL 350 MG/ML SOLN COMPARISON:  PET-CT 09/29/2020, MRI lumbar spine 01/04/2021, ct abdomen/pelvis 05/18/20 FINDINGS: CT abdomen/pelvis: Lower chest: Linear atelectasis versus scarring of the right lower lobe.  Limited evaluation due to motion artifact. No acute abnormality. Hepatobiliary: No focal liver abnormality. No gallstones, gallbladder wall thickening, or pericholecystic fluid. No biliary dilatation. Pancreas: No focal lesion. Normal pancreatic contour. No surrounding inflammatory changes. No main pancreatic ductal dilatation. Spleen: Normal in size without focal abnormality. Adrenals/Urinary Tract: No adrenal nodule bilaterally. Bilateral kidneys enhance symmetrically. There is a 1.7 cm fluid density lesion within the right kidney that likely represents a simple renal cyst. Similar findings are noted within the left kidney measuring to 2.3 cm. Subcentimeter hypodensities are too small to characterize. No hydronephrosis. No  hydroureter. Circumferential urinary bladder wall thickening. Stomach/Bowel: Stomach is within normal limits. No evidence of bowel wall thickening or dilatation. Stool throughout the colon. Appendix appears normal. 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 There is a left common iliac to external iliac to femoral vein stent. No abdominal aorta or iliac aneurysm. Severe calcified and noncalcified atherosclerotic plaque of the aorta and its branches. No abdominal, pelvic, or inguinal lymphadenopathy. Reproductive: Prostate is unremarkable. Other: No intraperitoneal free fluid. No intraperitoneal free gas. No organized fluid collection. Musculoskeletal: Similar-appearing approximately 6 cm soft tissue density along the iliopsoas muscle with associated poorly visualized erosion of the left sacral ala (2:57). No suspicious lytic or blastic osseous lesions. No acute displaced fracture. Multilevel degenerative changes of the spine. CT lumbar spine: Segmentation: 5 lumbar type vertebrae. Alignment: Normal. Vertebrae: Mild degenerative changes. No acute fracture or focal pathologic process. Paraspinal and other soft tissues: Negative. IMPRESSION: 1. Limited evaluation of the upper abdomen, pelvis, spine due to motion artifact. 2. 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 3. Circumferential urinary bladder wall thickening. 4. Similar-appearing known left sacral al a metastasis/erosion with associated iliopsoas soft tissue density. 5. Constipation. 6. No acute displaced fracture or traumatic listhesis of the lumbar spine. Electronically Signed   By: Iven Finn M.D.   On: 02/25/2021 20:51   CT L-SPINE NO CHARGE  Result Date: 02/25/2021 CLINICAL DATA:   Urologic cancer, staging bladder cancer with mets to bone. Prior PET-CT demonstrates Stable left retroperitoneal metastasis along the left anterior aspect of the lumbosacral junction involving the left iliopsoas musculature.  MRI demonstrates left sacral metastasis. EXAM: CT ABDOMEN AND PELVIS WITH CONTRAST CT LUMBAR SPINE WITHOUT CONTRAST TECHNIQUE: Multidetector CT imaging of the abdomen and pelvis was performed using the standard protocol following bolus administration of intravenous contrast. Multidetector CT imaging of the lumbar spine was performed without intravenous contrast administration. Multiplanar CT image reconstructions were also generated. CONTRAST:  81m OMNIPAQUE IOHEXOL 350 MG/ML SOLN COMPARISON:  PET-CT 09/29/2020, MRI lumbar spine 01/04/2021, ct abdomen/pelvis 05/18/20 FINDINGS: CT abdomen/pelvis: Lower chest: Linear atelectasis versus scarring of the right lower lobe. Limited evaluation due to motion artifact. No acute abnormality. Hepatobiliary: No focal liver abnormality. No gallstones, gallbladder wall thickening, or pericholecystic fluid. No biliary dilatation. Pancreas: No focal lesion. Normal pancreatic contour. No surrounding inflammatory changes. No main pancreatic ductal dilatation. Spleen: Normal in size without focal abnormality. Adrenals/Urinary Tract: No adrenal nodule bilaterally. Bilateral kidneys enhance symmetrically. There is a 1.7 cm fluid density lesion within the right kidney that likely represents a simple renal cyst. Similar findings are noted within the left kidney measuring to 2.3 cm. Subcentimeter hypodensities are too small to characterize. No hydronephrosis. No hydroureter. Circumferential urinary bladder  wall thickening. Stomach/Bowel: Stomach is within normal limits. No evidence of bowel wall thickening or dilatation. Stool throughout the colon. Appendix appears normal. Vascular/Lymphatic: Persistent complete occlusion of the indwelling left iliofemoral venous stent  with interval increase in large filling defect within the inferior vena cava measuing 2 x 2 x 8.5cm. There is a left common iliac to external iliac to femoral vein stent. No abdominal aorta or iliac aneurysm. Severe calcified and noncalcified atherosclerotic plaque of the aorta and its branches. No abdominal, pelvic, or inguinal lymphadenopathy. Reproductive: Prostate is unremarkable. Other: No intraperitoneal free fluid. No intraperitoneal free gas. No organized fluid collection. Musculoskeletal: Similar-appearing approximately 6 cm soft tissue density along the iliopsoas muscle with associated poorly visualized erosion of the left sacral ala (2:57). No suspicious lytic or blastic osseous lesions. No acute displaced fracture. Multilevel degenerative changes of the spine. CT lumbar spine: Segmentation: 5 lumbar type vertebrae. Alignment: Normal. Vertebrae: Mild degenerative changes. No acute fracture or focal pathologic process. Paraspinal and other soft tissues: Negative. IMPRESSION: 1. Limited evaluation of the upper abdomen, pelvis, spine due to motion artifact. 2. 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 3. Circumferential urinary bladder wall thickening. 4. Similar-appearing known left sacral al a metastasis/erosion with associated iliopsoas soft tissue density. 5. Constipation. 6. No acute displaced fracture or traumatic listhesis of the lumbar spine. Electronically Signed   By: Iven Finn M.D.   On: 02/25/2021 20:51   DG Hip Unilat W or Wo Pelvis 2-3 Views Left  Result Date: 02/25/2021 CLINICAL DATA:  Left hip pain EXAM: DG HIP (WITH OR WITHOUT PELVIS) 2-3V LEFT COMPARISON:  None. FINDINGS: Limited study by positioning. The patient would not lie flat for imaging. No definite visible acute bony abnormality. Degenerative changes in the hips bilaterally. IMPRESSION: Limited study by  positioning. The patient would not lie flat. No definite visible fracture. Electronically Signed   By: Rolm Baptise M.D.   On: 02/25/2021 15:36   VAS [place] LOWER EXTREMITY VENOUS (DVT) (ONLY [facility] & [facility])  Result Date: 02/25/2021  Lower Venous DVT Study Patient Name:  Brian Benton  Date of Exam:   02/25/2021 Medical Rec #: [id number]        Accession #:    [id number] Date of Birth: [birth year]        Patient Gender: M Patient Age:   35 years Exam Location:  [facility] Procedure:      VAS [place] LOWER EXTREMITY VENOUS (DVT) Referring Phys: DAVID YAO --------------------------------------------------------------------------------  Indications: Swelling.  Risk Factors: Cancer DVT. Limitations: Poor ultrasound/tissue interface and patient positioning, patient constant movement, poor patient cooperation. Comparison Study: 05/12/2020 - Sonographic survey of the left lower extremity                   demonstrates stent                   changes of the common femoral vein, with persisting DVT of the                   left                   common femoral vein stent, occlusive. Positive duplex for DVT                   at the  SFJ, junction of the profunda vein, and through the femoral                   vein                   segment. Performing Technologist: Oliver Hum RVT  Examination Guidelines: A complete evaluation includes B-mode imaging, spectral Doppler, color Doppler, and power Doppler as needed of all accessible portions of each vessel. Bilateral testing is considered an integral part of a complete examination. Limited examinations for reoccurring indications may be performed as noted. The reflux portion of the exam is performed with the patient in reverse Trendelenburg.  +-----+---------------+---------+-----------+----------+--------------+ RIGHTCompressibilityPhasicitySpontaneityPropertiesThrombus Aging +-----+---------------+---------+-----------+----------+--------------+ CFV   Full           Yes      Yes                                 +-----+---------------+---------+-----------+----------+--------------+   +---------+---------------+---------+-----------+----------+-----------------+ LEFT     CompressibilityPhasicitySpontaneityPropertiesThrombus Aging    +---------+---------------+---------+-----------+----------+-----------------+ CFV                     No       No                   Age Indeterminate +---------+---------------+---------+-----------+----------+-----------------+ FV Prox  Partial        Yes      Yes                  Age Indeterminate +---------+---------------+---------+-----------+----------+-----------------+ FV Mid   Full                                                           +---------+---------------+---------+-----------+----------+-----------------+ FV DistalNone           No       No                   Age Indeterminate +---------+---------------+---------+-----------+----------+-----------------+ POP      Partial        Yes      Yes                  Age Indeterminate +---------+---------------+---------+-----------+----------+-----------------+ PTV      Full                                                           +---------+---------------+---------+-----------+----------+-----------------+ PERO     None                                         Age Indeterminate +---------+---------------+---------+-----------+----------+-----------------+ Gastroc  Partial                                      Age Indeterminate +---------+---------------+---------+-----------+----------+-----------------+ EIV                     No  No                   Age Indeterminate +---------+---------------+---------+-----------+----------+-----------------+ A stent is noted in the common femoral vein. Unable to assess the CIV, or the IVC due to the patient's positioning and movement.    Summary: RIGHT: -  No evidence of common femoral vein obstruction.  LEFT: - Findings consistent with age indeterminate deep vein thrombosis involving the left, external iliac vein, left common femoral vein, left femoral vein, left popliteal vein, left peroneal veins, and left gastrocnemius veins. - No cystic structure found in the popliteal fossa.  *See table(s) above for measurements and observations.    Preliminary         Scheduled Meds:  aspirin EC  81 mg Oral Daily   DULoxetine  30 mg Oral QHS   Followed by   [START ON 03/01/2021] DULoxetine  60 mg Oral QHS   fentaNYL  1 patch Transdermal Q48H   pantoprazole  40 mg Oral BID   polyethylene glycol  17 g Oral Daily   pregabalin  50 mg Oral BID   Followed by   Derrill Memo ON 03/01/2021] pregabalin  50 mg Oral TID   Continuous Infusions:  sodium chloride Stopped (02/25/21 2247)   heparin 1,400 Units/hr (02/25/21 2258)     LOS: 0 days   Time spent= 35 mins    Sarya Linenberger Arsenio Loader, MD [facility]  If 7PM-7AM, please contact night-coverage  02/26/2021, 7:53 AM

## 2021-02-26 NOTE — ED Notes (Signed)
PT C/O PAIN TO HIP. PT ASSURED THAT HE WILL BE GIVEN HIS PAIN MEDS THAT ARE PRN.

## 2021-02-26 NOTE — Consult Note (Addendum)
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  He is currently receiving treatment with Padcev for his metastatic bladder cancer.  Last cycle was given on 02/10/2021.  He came to the emergency department with worsening left hip pain for the past 4 to 5 days.  Work-up in the emergency department significant for WBC of 16.7, hemoglobin 9.8, platelet count 48,000.  Left hip x-ray did not show a fracture.  Ultrasound showed an age-indeterminate DVT involving the left external iliac, left common, left popliteal, left peroneal, and left gastrocnemius veins.  CT of the abdomen/pelvis with contrast showed persistent complete occlusion of the indwelling left iliofemoral venous stent with interval increase of an almost occlusive inferior vena cava thrombus.  He was started on IV heparin and IV pain medication has been ordered.  This morning, he reports ongoing pain to his left leg, but pain medication has been helpful.  He is not having any bleeding today.  He denies headaches, dizziness, chest pain, shortness of breath, abdominal pain, nausea, vomiting.  No bleeding reported.  Medical oncology was asked to see to make recommendations regarding his DVT, thrombocytopenia, and metastatic bladder cancer.  Past Medical History:  Diagnosis Date   Cancer [facility])    bladder   History of bladder cancer    s/p  TURBT 07-25-2014   Hyperlipidemia    Mass of left hand    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 ([place]) 03/06/2020  :   Past Surgical History:  Procedure Laterality Date   BLADDER SURGERY     [place]   COLONOSCOPY     CYSTECTOMY      CYSTOSCOPY WITH BIOPSY N/A 07/25/2014   Procedure: CYSTOSCOPY WITH BLADDER BIOPSY AND FULGERATION;  Surgeon: [name], MD;  Location: [facility];  Service: Urology;  Laterality: N/A;   CYSTOSCOPY WITH BIOPSY N/A 11/28/2014   Procedure: CYSTOSCOPY WITH  BLADDER BIOPSY;  Surgeon: [name], MD;  Location: [facility];  Service: Urology;  Laterality: N/A;   HEMORRHOID SURGERY  03/15/2012   Procedure: HEMORRHOIDECTOMY;  Surgeon: [name], MD;  Location: [facility];  Service: General;  Laterality: N/A;   HERNIA REPAIR     INGUINAL HERNIA REPAIR Bilateral 2006   INTRAVASCULAR ULTRASOUND/IVUS N/A 03/13/2020   Procedure: Intravascular Ultrasound/IVUS;  Surgeon: Elam Dutch, MD;  Location: [place] [facility];  Service: Cardiovascular;  Laterality: N/A;   IR IMAGING GUIDED PORT INSERTION  06/01/2020   IR INTRAVASCULAR ULTRASOUND NON CORONARY  06/08/2020   IR INTRAVASCULAR ULTRASOUND NON CORONARY  06/17/2020   IR IVC FILTER PLMT / S&I /IMG GUID/MOD SED  06/08/2020   IR PTA VENOUS EXCEPT DIALYSIS CIRCUIT  06/08/2020   IR RADIOLOGIST EVAL & MGMT  04/30/2020   IR RADIOLOGIST EVAL & MGMT  05/27/2020   IR RADIOLOGIST EVAL & MGMT  07/21/2020   IR TRANSCATH PLC STENT  INITIAL VEIN  INC ANGIOPLASTY  06/17/2020   IR TRANSCATH PLC STENT 1ST ART NOT LE CV CAR VERT CAR  06/08/2020   IR US GUIDE VASC ACCESS LEFT  06/08/2020   IR US GUIDE VASC ACCESS LEFT  06/17/2020   IR VENO/EXT/UNI LEFT  06/08/2020   IR VENO/EXT/UNI LEFT  06/17/2020   IVC VENOGRAPHY N/A 03/13/2020   Procedure: IVC Venography;  Surgeon: Elam Dutch, MD;  Location: [place] [facility];  Service: Cardiovascular;  Laterality: N/A;   LOWER EXTREMITY VENOGRAPHY Left 03/13/2020   Procedure: LOWER EXTREMITY VENOGRAPHY;  Surgeon: Elam Dutch, MD;  Location: [place] [facility];  Service: Cardiovascular;  Laterality: Left;   MASS EXCISION Left 11/12/2018   Procedure: EXCISION MASS LEFT HAND;  Surgeon:  Daryll Brod, MD;  Location: [place];  Service: Orthopedics;  Laterality: Left;  FAB   PERIPHERAL VASCULAR INTERVENTION  03/13/2020   Procedure: PERIPHERAL VASCULAR INTERVENTION;  Surgeon: Elam Dutch, MD;  Location: [place] [facility];  Service: Cardiovascular;;   PERIPHERAL VASCULAR THROMBECTOMY N/A 03/13/2020   Procedure: PERIPHERAL VASCULAR THROMBECTOMY;  Surgeon: Elam Dutch, MD;  Location: [place] [facility];  Service: Cardiovascular;  Laterality: N/A;   RADIOLOGY WITH ANESTHESIA N/A 02/20/2020   Procedure: MRI LUMBAR W/O CONTRAST  WITH ANESTHESIA;  Surgeon: Radiologist, Medication, MD;  Location: [place];  Service: Radiology;  Laterality: N/A;   RADIOLOGY WITH ANESTHESIA N/A 06/08/2020   Procedure: IR WITH ANESTHESIA VENOGRAM;  Surgeon: Radiologist, Medication, MD;  Location: [place];  Service: Radiology;  Laterality: N/A;   TESTICLE SURGERY  2004   Ruptured Undescended Right testicle    TRANSURETHRAL RESECTION OF BLADDER TUMOR WITH GYRUS (TURBT-GYRUS) N/A 05/23/2014   Procedure: TRANSURETHRAL RESECTION OF BLADDER TUMOR WITH GYRUS (TURBT-GYRUS);  Surgeon: [name], MD;  Location: [facility];  Service: Urology;  Laterality: N/A;   WISDOM TOOTH EXTRACTION    :   Current Facility-Administered Medications  Medication Dose Route Frequency Provider Last Rate Last Admin   0.9 %  sodium chloride infusion   Intravenous Continuous Reubin Milan, MD   Held at 02/25/21 2247   acetaminophen (TYLENOL) tablet 650 mg  650 mg Oral Q6H PRN Reubin Milan, MD       Or   acetaminophen (TYLENOL) suppository 650 mg  650 mg Rectal Q6H PRN Reubin Milan, MD       aspirin EC tablet 81 mg  81 mg Oral Daily Reubin Milan, MD   81 mg at 02/26/21 0957   DULoxetine (CYMBALTA) DR capsule 30 mg  30 mg Oral QHS Reubin Milan, MD   30 mg at 02/25/21 2247   Followed by   Derrill Memo ON 03/01/2021] DULoxetine (CYMBALTA) DR capsule 60 mg  60 mg Oral QHS  Reubin Milan, MD       fentaNYL ([place]) 100 MCG/HR 1 patch  1 patch Transdermal Q48H Reubin Milan, MD   1 patch at 02/26/21 0820   guaiFENesin (ROBITUSSIN) 100 MG/5ML solution 100 mg  5 mL Oral Q4H PRN Amin, Ankit Chirag, MD       heparin ADULT infusion 100 units/mL (25000 units/245m)  1,300 Units/hr Intravenous Continuous Absher, RJulieta Bellini RPH 13 mL/hr at 02/26/21 0816 1,300 Units/hr at 02/26/21 0816   hydrALAZINE (APRESOLINE) injection 10 mg  10 mg Intravenous Q4H PRN Amin, Ankit Chirag, MD       HYDROmorphone (DILAUDID) injection 1 mg  1 mg Intravenous Q2H PRN OReubin Milan MD   1 mg at 02/26/21 [id number]  ipratropium-albuterol (DUONEB) 0.5-2.5 (3) MG/3ML nebulizer solution 3 mL  3 mL Nebulization Q4H PRN Amin, Ankit Chirag, MD       lactulose (CHRONULAC) 10 GM/15ML solution 30 g  30 g Oral Q4H PRN OReubin Milan MD  LORazepam (ATIVAN) tablet 1 mg  1 mg Oral Q6H PRN Amin, Ankit Chirag, MD       metoprolol tartrate (LOPRESSOR) injection 5 mg  5 mg Intravenous Q4H PRN Amin, Ankit Chirag, MD       ondansetron (ZOFRAN) injection 4 mg  4 mg Intravenous Q6H PRN Reubin Milan, MD       oxyCODONE (Oxy IR/ROXICODONE) immediate release tablet 10 mg  10 mg Oral Q3H PRN Reubin Milan, MD   10 mg at 02/26/21 0956   pantoprazole (PROTONIX) EC tablet 40 mg  40 mg Oral BID Reubin Milan, MD   40 mg at 02/26/21 0956   polyethylene glycol (MIRALAX / GLYCOLAX) packet 17 g  17 g Oral BID Amin, Ankit Chirag, MD       potassium chloride 10 mEq in 100 mL IVPB  10 mEq Intravenous Q1 Hr x 4 Amin, Ankit Chirag, MD       pregabalin (LYRICA) capsule 50 mg  50 mg Oral BID Reubin Milan, MD   50 mg at 02/26/21 [id number]   Followed by   Derrill Memo ON 03/01/2021] pregabalin (LYRICA) capsule 50 mg  50 mg Oral TID Reubin Milan, MD       prochlorperazine (COMPAZINE) tablet 10 mg  10 mg Oral Q6H PRN Amin, Ankit Chirag, MD       traZODone (DESYREL) tablet 50 mg  50 mg Oral QHS  PRN Amin, Jeanella Flattery, MD         No Known Allergies:   Family History  Problem Relation Age of Onset   Cancer Mother        leukemia   Diabetes Brother   :   Social History   Socioeconomic History   Marital status: Married    Spouse name: Not on file   Number of children: 2   Years of education: Not on file   Highest education level: Not on file  Occupational History   Not on file  Tobacco Use   Smoking status: Former    Packs/day: 0.00    Years: 30.00    Pack years: 0.00    Types: Cigarettes   Smokeless tobacco: Never   Tobacco comments:    Smokes 2 cigarettes/day.  Vaping Use   Vaping Use: Never used  Substance and Sexual Activity   Alcohol use: Not Currently    Comment: many 4 times a years a social drink   Drug use: No   Sexual activity: Not on file  Other Topics Concern   Not on file  Social History Narrative   Not on file   Social Determinants of Health   Financial Resource Strain: Not on file  Food Insecurity: Not on file  Transportation Needs: Not on file  Physical Activity: Not on file  Stress: Not on file  Social Connections: Not on file  Intimate Partner Violence: Not on file  :  Review of Systems: A comprehensive 14 point review of systems was negative except as noted in the HPI.  Exam: Patient Vitals for the past 24 hrs:  BP Temp Temp src Pulse Resp SpO2 Height Weight  02/26/21 0600 -- 98.4 F (36.9 C) -- -- -- -- -- --  02/26/21 0559 (!) 155/81 -- Oral 92 (!) 24 -- -- --  02/26/21 0445 (!) 146/84 -- -- 98 -- 98 % -- --  02/26/21 0430 (!) 145/89 -- -- 100 -- 98 % -- --  02/26/21 0400 (!) 150/82 -- -- 93 --  97 % -- --  02/26/21 0330 (!) 153/86 -- -- 93 -- 97 % -- --  02/26/21 0300 131/81 -- -- 93 -- 97 % -- --  02/26/21 0230 (!) 154/81 -- -- 90 15 97 % -- --  02/26/21 0215 134/86 -- -- -- 13 -- -- --  02/26/21 0200 (!) 161/88 -- -- -- 11 -- -- --  02/26/21 0130 (!) 155/80 -- -- -- 18 -- -- --  02/26/21 0100 (!) 142/91 -- -- -- 13  -- -- --  02/26/21 0030 (!) 144/92 -- -- 97 19 96 % -- --  02/26/21 0015 132/79 -- -- 95 18 96 % -- --  02/25/21 1930 (!) 149/93 -- -- 89 -- 95 % -- --  02/25/21 1900 134/81 -- -- 82 -- 96 % -- --  02/25/21 1853 140/82 -- -- 87 18 97 % -- --  02/25/21 1800 120/70 -- -- 81 18 98 % -- --  02/25/21 1643 132/77 98.6 F (37 C) Oral 84 -- 97 % -- --  02/25/21 1632 113/85 -- -- 89 18 98 % -- --  02/25/21 1557 122/79 98.4 F (36.9 C) Oral 85 18 97 % 6' (1.829 m) 77.1 kg  02/25/21 1416 -- -- -- -- -- -- 6' (1.829 m) 77.1 kg  02/25/21 1413 118/85 98.3 F (36.8 C) Oral 96 20 98 % -- --  02/25/21 1358 129/87 98 F (36.7 C) Oral 99 18 97 % -- --    General:  well-nourished in no acute distress.   Eyes:  no scleral icterus.   ENT:  There were no oropharyngeal lesions.   Neck was without thyromegaly.   Lymphatics:  Negative cervical, supraclavicular or axillary adenopathy.   Respiratory: lungs were clear bilaterally without wheezing or crackles.   Cardiovascular:  Regular rate and rhythm, S1/S2, without murmur, rub or gallop.  There was no pedal edema.   GI:  abdomen was soft, flat, nontender, nondistended, without organomegaly.   Musculoskeletal:  no spinal tenderness of palpation of vertebral spine.   Skin exam was without echymosis, petichae.   Neuro exam was nonfocal. Patient was alert and oriented.  Attention was good.   Language was appropriate.  Mood was normal without depression.  Speech was not pressured.  Thought content was not tangential.     Lab Results  Component Value Date   WBC 17.6 (H) 02/26/2021   HGB 8.8 (L) 02/26/2021   HCT 27.0 (L) 02/26/2021   PLT 49 (L) 02/26/2021   GLUCOSE 130 (H) 02/26/2021   CHOL 270 (HH) 02/21/2007   TRIG 156 (H) 02/21/2007   HDL 18.6 (L) 02/21/2007   LDLDIRECT 236.0 02/21/2007   ALT 22 02/25/2021   AST 19 02/25/2021   NA 134 (L) 02/26/2021   K 3.4 (L) 02/26/2021   CL 99 02/26/2021   CREATININE 0.86 02/26/2021   BUN 30 (H) 02/26/2021    CO2 22 02/26/2021    CT ABDOMEN PELVIS W CONTRAST  Result Date: 02/25/2021 CLINICAL DATA:  Urologic cancer, staging bladder cancer with mets to bone. Prior PET-CT demonstrates Stable left retroperitoneal metastasis along the left anterior aspect of the lumbosacral junction involving the left iliopsoas musculature.  MRI demonstrates left sacral metastasis. EXAM: CT ABDOMEN AND PELVIS WITH CONTRAST CT LUMBAR SPINE WITHOUT CONTRAST TECHNIQUE: Multidetector CT imaging of the abdomen and pelvis was performed using the standard protocol following bolus administration of intravenous contrast. Multidetector CT imaging of the lumbar spine was performed without intravenous contrast administration. Multiplanar  CT image reconstructions were also generated. CONTRAST:  15m OMNIPAQUE IOHEXOL 350 MG/ML SOLN COMPARISON:  PET-CT 09/29/2020, MRI lumbar spine 01/04/2021, ct abdomen/pelvis 05/18/20 FINDINGS: CT abdomen/pelvis: Lower chest: Linear atelectasis versus scarring of the right lower lobe. Limited evaluation due to motion artifact. No acute abnormality. Hepatobiliary: No focal liver abnormality. No gallstones, gallbladder wall thickening, or pericholecystic fluid. No biliary dilatation. Pancreas: No focal lesion. Normal pancreatic contour. No surrounding inflammatory changes. No main pancreatic ductal dilatation. Spleen: Normal in size without focal abnormality. Adrenals/Urinary Tract: No adrenal nodule bilaterally. Bilateral kidneys enhance symmetrically. There is a 1.7 cm fluid density lesion within the right kidney that likely represents a simple renal cyst. Similar findings are noted within the left kidney measuring to 2.3 cm. Subcentimeter hypodensities are too small to characterize. No hydronephrosis. No hydroureter. Circumferential urinary bladder wall thickening. Stomach/Bowel: Stomach is within normal limits. No evidence of bowel wall thickening or dilatation. Stool throughout the colon. Appendix appears normal.  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 There is a left common iliac to external iliac to femoral vein stent. No abdominal aorta or iliac aneurysm. Severe calcified and noncalcified atherosclerotic plaque of the aorta and its branches. No abdominal, pelvic, or inguinal lymphadenopathy. Reproductive: Prostate is unremarkable. Other: No intraperitoneal free fluid. No intraperitoneal free gas. No organized fluid collection. Musculoskeletal: Similar-appearing approximately 6 cm soft tissue density along the iliopsoas muscle with associated poorly visualized erosion of the left sacral ala (2:57). No suspicious lytic or blastic osseous lesions. No acute displaced fracture. Multilevel degenerative changes of the spine. CT lumbar spine: Segmentation: 5 lumbar type vertebrae. Alignment: Normal. Vertebrae: Mild degenerative changes. No acute fracture or focal pathologic process. Paraspinal and other soft tissues: Negative. IMPRESSION: 1. Limited evaluation of the upper abdomen, pelvis, spine due to motion artifact. 2. 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 3. Circumferential urinary bladder wall thickening. 4. Similar-appearing known left sacral al a metastasis/erosion with associated iliopsoas soft tissue density. 5. Constipation. 6. No acute displaced fracture or traumatic listhesis of the lumbar spine. Electronically Signed   By: MIven FinnM.D.   On: 02/25/2021 20:51   CT L-SPINE NO CHARGE  Result Date: 02/25/2021 CLINICAL DATA:  Urologic cancer, staging bladder cancer with mets to bone. Prior PET-CT demonstrates Stable left retroperitoneal metastasis along the left anterior aspect of the lumbosacral junction involving the left iliopsoas  musculature.  MRI demonstrates left sacral metastasis. EXAM: CT ABDOMEN AND PELVIS WITH CONTRAST CT LUMBAR SPINE WITHOUT CONTRAST TECHNIQUE: Multidetector CT imaging of the abdomen and pelvis was performed using the standard protocol following bolus administration of intravenous contrast. Multidetector CT imaging of the lumbar spine was performed without intravenous contrast administration. Multiplanar CT image reconstructions were also generated. CONTRAST:  849mOMNIPAQUE IOHEXOL 350 MG/ML SOLN COMPARISON:  PET-CT 09/29/2020, MRI lumbar spine 01/04/2021, ct abdomen/pelvis 05/18/20 FINDINGS: CT abdomen/pelvis: Lower chest: Linear atelectasis versus scarring of the right lower lobe. Limited evaluation due to motion artifact. No acute abnormality. Hepatobiliary: No focal liver abnormality. No gallstones, gallbladder wall thickening, or pericholecystic fluid. No biliary dilatation. Pancreas: No focal lesion. Normal pancreatic contour. No surrounding inflammatory changes. No main pancreatic ductal dilatation. Spleen: Normal in size without focal abnormality. Adrenals/Urinary Tract: No adrenal nodule bilaterally. Bilateral kidneys enhance symmetrically. There is a 1.7 cm fluid  density lesion within the right kidney that likely represents a simple renal cyst. Similar findings are noted within the left kidney measuring to 2.3 cm. Subcentimeter hypodensities are too small to characterize. No hydronephrosis. No hydroureter. Circumferential urinary bladder wall thickening. Stomach/Bowel: Stomach is within normal limits. No evidence of bowel wall thickening or dilatation. Stool throughout the colon. Appendix appears normal. 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 There is a left common iliac to external iliac to femoral vein stent. No abdominal aorta or iliac aneurysm.  Severe calcified and noncalcified atherosclerotic plaque of the aorta and its branches. No abdominal, pelvic, or inguinal lymphadenopathy. Reproductive: Prostate is unremarkable. Other: No intraperitoneal free fluid. No intraperitoneal free gas. No organized fluid collection. Musculoskeletal: Similar-appearing approximately 6 cm soft tissue density along the iliopsoas muscle with associated poorly visualized erosion of the left sacral ala (2:57). No suspicious lytic or blastic osseous lesions. No acute displaced fracture. Multilevel degenerative changes of the spine. CT lumbar spine: Segmentation: 5 lumbar type vertebrae. Alignment: Normal. Vertebrae: Mild degenerative changes. No acute fracture or focal pathologic process. Paraspinal and other soft tissues: Negative. IMPRESSION: 1. Limited evaluation of the upper abdomen, pelvis, spine due to motion artifact. 2. 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 3. Circumferential urinary bladder wall thickening. 4. Similar-appearing known left sacral al a metastasis/erosion with associated iliopsoas soft tissue density. 5. Constipation. 6. No acute displaced fracture or traumatic listhesis of the lumbar spine. Electronically Signed   By: Iven Finn M.D.   On: 02/25/2021 20:51   DG Chest Port 1 View  Result Date: 02/26/2021 CLINICAL DATA:  Confusion, weakness EXAM: PORTABLE CHEST 1 VIEW COMPARISON:  03/15/2012 FINDINGS: Right Port-A-Cath in place with the tip in the right atrium. Heart is normal size. Lungs clear. No effusions or acute bony abnormality. IMPRESSION: No active disease. Electronically Signed   By: Rolm Baptise M.D.   On: 02/26/2021 09:12   DG Hip Unilat W or Wo Pelvis 2-3 Views Left  Result Date: 02/25/2021 CLINICAL DATA:  Left hip pain EXAM: DG HIP (WITH OR WITHOUT PELVIS) 2-3V LEFT COMPARISON:  None. FINDINGS: Limited study by  positioning. The patient would not lie flat for imaging. No definite visible acute bony abnormality. Degenerative changes in the hips bilaterally. IMPRESSION: Limited study by positioning. The patient would not lie flat. No definite visible fracture. Electronically Signed   By: Rolm Baptise M.D.   On: 02/25/2021 15:36   VAS [place] LOWER EXTREMITY VENOUS (DVT) (ONLY [facility] & [facility])  Result Date: 02/25/2021  Lower Venous DVT Study Patient Name:  Brian Benton  Date of Exam:   02/25/2021 Medical Rec #: [id number]        Accession #:    [id number] Date of Birth: 1956-02-17        Patient Gender: M Patient Age:   52 years Exam Location:  [facility] Procedure:      VAS [place] LOWER EXTREMITY VENOUS (DVT) Referring Phys: DAVID YAO --------------------------------------------------------------------------------  Indications: Swelling.  Risk Factors: Cancer DVT. Limitations: Poor ultrasound/tissue interface and patient positioning, patient constant movement, poor patient cooperation. Comparison Study: 05/12/2020 - Sonographic survey of the left lower extremity                   demonstrates stent  changes of the common femoral vein, with persisting DVT of the                   left                   common femoral vein stent, occlusive. Positive duplex for DVT                   at the                   SFJ, junction of the profunda vein, and through the femoral                   vein                   segment. Performing Technologist: Oliver Hum RVT  Examination Guidelines: A complete evaluation includes B-mode imaging, spectral Doppler, color Doppler, and power Doppler as needed of all accessible portions of each vessel. Bilateral testing is considered an integral part of a complete examination. Limited examinations for reoccurring indications may be performed as noted. The reflux portion of the exam is performed with the patient in reverse Trendelenburg.   +-----+---------------+---------+-----------+----------+--------------+ RIGHTCompressibilityPhasicitySpontaneityPropertiesThrombus Aging +-----+---------------+---------+-----------+----------+--------------+ CFV  Full           Yes      Yes                                 +-----+---------------+---------+-----------+----------+--------------+   +---------+---------------+---------+-----------+----------+-----------------+ LEFT     CompressibilityPhasicitySpontaneityPropertiesThrombus Aging    +---------+---------------+---------+-----------+----------+-----------------+ CFV                     No       No                   Age Indeterminate +---------+---------------+---------+-----------+----------+-----------------+ FV Prox  Partial        Yes      Yes                  Age Indeterminate +---------+---------------+---------+-----------+----------+-----------------+ FV Mid   Full                                                           +---------+---------------+---------+-----------+----------+-----------------+ FV DistalNone           No       No                   Age Indeterminate +---------+---------------+---------+-----------+----------+-----------------+ POP      Partial        Yes      Yes                  Age Indeterminate +---------+---------------+---------+-----------+----------+-----------------+ PTV      Full                                                           +---------+---------------+---------+-----------+----------+-----------------+ PERO     None  Age Indeterminate +---------+---------------+---------+-----------+----------+-----------------+ Gastroc  Partial                                      Age Indeterminate +---------+---------------+---------+-----------+----------+-----------------+ EIV                     No       No                   Age Indeterminate  +---------+---------------+---------+-----------+----------+-----------------+ A stent is noted in the common femoral vein. Unable to assess the CIV, or the IVC due to the patient's positioning and movement.    Summary: RIGHT: - No evidence of common femoral vein obstruction.  LEFT: - Findings consistent with age indeterminate deep vein thrombosis involving the left, external iliac vein, left common femoral vein, left femoral vein, left popliteal vein, left peroneal veins, and left gastrocnemius veins. - No cystic structure found in the popliteal fossa.  *See table(s) above for measurements and observations.    Preliminary      CT ABDOMEN PELVIS W CONTRAST  Result Date: 02/25/2021 CLINICAL DATA:  Urologic cancer, staging bladder cancer with mets to bone. Prior PET-CT demonstrates Stable left retroperitoneal metastasis along the left anterior aspect of the lumbosacral junction involving the left iliopsoas musculature.  MRI demonstrates left sacral metastasis. EXAM: CT ABDOMEN AND PELVIS WITH CONTRAST CT LUMBAR SPINE WITHOUT CONTRAST TECHNIQUE: Multidetector CT imaging of the abdomen and pelvis was performed using the standard protocol following bolus administration of intravenous contrast. Multidetector CT imaging of the lumbar spine was performed without intravenous contrast administration. Multiplanar CT image reconstructions were also generated. CONTRAST:  44m OMNIPAQUE IOHEXOL 350 MG/ML SOLN COMPARISON:  PET-CT 09/29/2020, MRI lumbar spine 01/04/2021, ct abdomen/pelvis 05/18/20 FINDINGS: CT abdomen/pelvis: Lower chest: Linear atelectasis versus scarring of the right lower lobe. Limited evaluation due to motion artifact. No acute abnormality. Hepatobiliary: No focal liver abnormality. No gallstones, gallbladder wall thickening, or pericholecystic fluid. No biliary dilatation. Pancreas: No focal lesion. Normal pancreatic contour. No surrounding inflammatory changes. No main pancreatic ductal dilatation.  Spleen: Normal in size without focal abnormality. Adrenals/Urinary Tract: No adrenal nodule bilaterally. Bilateral kidneys enhance symmetrically. There is a 1.7 cm fluid density lesion within the right kidney that likely represents a simple renal cyst. Similar findings are noted within the left kidney measuring to 2.3 cm. Subcentimeter hypodensities are too small to characterize. No hydronephrosis. No hydroureter. Circumferential urinary bladder wall thickening. Stomach/Bowel: Stomach is within normal limits. No evidence of bowel wall thickening or dilatation. Stool throughout the colon. Appendix appears normal. 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 There is a left common iliac to external iliac to femoral vein stent. No abdominal aorta or iliac aneurysm. Severe calcified and noncalcified atherosclerotic plaque of the aorta and its branches. No abdominal, pelvic, or inguinal lymphadenopathy. Reproductive: Prostate is unremarkable. Other: No intraperitoneal free fluid. No intraperitoneal free gas. No organized fluid collection. Musculoskeletal: Similar-appearing approximately 6 cm soft tissue density along the iliopsoas muscle with associated poorly visualized erosion of the left sacral ala (2:57). No suspicious lytic or blastic osseous lesions. No acute displaced fracture. Multilevel degenerative changes of the spine. CT lumbar spine: Segmentation: 5 lumbar type vertebrae. Alignment: Normal. Vertebrae: Mild degenerative changes. No acute fracture or focal pathologic process. Paraspinal  and other soft tissues: Negative. IMPRESSION: 1. Limited evaluation of the upper abdomen, pelvis, spine due to motion artifact. 2. 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 3. Circumferential urinary bladder wall thickening. 4. Similar-appearing known left sacral al a metastasis/erosion with associated iliopsoas soft tissue density. 5. Constipation. 6. No acute displaced fracture or traumatic listhesis of the lumbar spine. Electronically Signed   By: Iven Finn M.D.   On: 02/25/2021 20:51   CT L-SPINE NO CHARGE  Result Date: 02/25/2021 CLINICAL DATA:  Urologic cancer, staging bladder cancer with mets to bone. Prior PET-CT demonstrates Stable left retroperitoneal metastasis along the left anterior aspect of the lumbosacral junction involving the left iliopsoas musculature.  MRI demonstrates left sacral metastasis. EXAM: CT ABDOMEN AND PELVIS WITH CONTRAST CT LUMBAR SPINE WITHOUT CONTRAST TECHNIQUE: Multidetector CT imaging of the abdomen and pelvis was performed using the standard protocol following bolus administration of intravenous contrast. Multidetector CT imaging of the lumbar spine was performed without intravenous contrast administration. Multiplanar CT image reconstructions were also generated. CONTRAST:  23m OMNIPAQUE IOHEXOL 350 MG/ML SOLN COMPARISON:  PET-CT 09/29/2020, MRI lumbar spine 01/04/2021, ct abdomen/pelvis 05/18/20 FINDINGS: CT abdomen/pelvis: Lower chest: Linear atelectasis versus scarring of the right lower lobe. Limited evaluation due to motion artifact. No acute abnormality. Hepatobiliary: No focal liver abnormality. No gallstones, gallbladder wall thickening, or pericholecystic fluid. No biliary dilatation. Pancreas: No focal lesion. Normal pancreatic contour. No surrounding inflammatory changes. No main pancreatic ductal dilatation. Spleen: Normal in size without focal abnormality. Adrenals/Urinary Tract: No adrenal nodule bilaterally. Bilateral kidneys enhance symmetrically. There is a 1.7 cm fluid density lesion within the right kidney that likely represents a simple renal cyst. Similar findings are noted within the left kidney measuring  to 2.3 cm. Subcentimeter hypodensities are too small to characterize. No hydronephrosis. No hydroureter. Circumferential urinary bladder wall thickening. Stomach/Bowel: Stomach is within normal limits. No evidence of bowel wall thickening or dilatation. Stool throughout the colon. Appendix appears normal. 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 There is a left common iliac to external iliac to femoral vein stent. No abdominal aorta or iliac aneurysm. Severe calcified and noncalcified atherosclerotic plaque of the aorta and its branches. No abdominal, pelvic, or inguinal lymphadenopathy. Reproductive: Prostate is unremarkable. Other: No intraperitoneal free fluid. No intraperitoneal free gas. No organized fluid collection. Musculoskeletal: Similar-appearing approximately 6 cm soft tissue density along the iliopsoas muscle with associated poorly visualized erosion of the left sacral ala (2:57). No suspicious lytic or blastic osseous lesions. No acute displaced fracture. Multilevel degenerative changes of the spine. CT lumbar spine: Segmentation: 5 lumbar type vertebrae. Alignment: Normal. Vertebrae: Mild degenerative changes. No acute fracture or focal pathologic process. Paraspinal and other soft tissues: Negative. IMPRESSION: 1. Limited evaluation of the upper abdomen, pelvis, spine due to motion artifact. 2. 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 3. Circumferential urinary bladder wall thickening. 4. Similar-appearing known left sacral al a metastasis/erosion with associated iliopsoas soft tissue density. 5. Constipation. 6. No acute displaced fracture or traumatic listhesis of the lumbar spine. Electronically Signed   By: MIven FinnM.D.    On: 02/25/2021 20:51   DG Chest Port 1 View  Result Date: 02/26/2021 CLINICAL DATA:  Confusion, weakness EXAM: PORTABLE CHEST 1 VIEW COMPARISON:  03/15/2012 FINDINGS: Right Port-A-Cath in place with the tip in the right atrium. Heart is normal size. Lungs clear. No effusions or acute bony abnormality. IMPRESSION: No active disease. Electronically Signed   By: Rolm Baptise M.D.   On: 02/26/2021 09:12   DG Hip Unilat W or Wo Pelvis 2-3 Views Left  Result Date: 02/25/2021 CLINICAL DATA:  Left hip pain EXAM: DG HIP (WITH OR WITHOUT PELVIS) 2-3V LEFT COMPARISON:  None. FINDINGS: Limited study by positioning. The patient would not lie flat for imaging. No definite visible acute bony abnormality. Degenerative changes in the hips bilaterally. IMPRESSION: Limited study by positioning. The patient would not lie flat. No definite visible fracture. Electronically Signed   By: Rolm Baptise M.D.   On: 02/25/2021 15:36   VAS [place] LOWER EXTREMITY VENOUS (DVT) (ONLY [facility] & [facility])  Result Date: 02/25/2021  Lower Venous DVT Study Patient Name:  Brian Benton  Date of Exam:   02/25/2021 Medical Rec #: [id number]        Accession #:    [id number] Date of Birth: Aug 18, 1955        Patient Gender: M Patient Age:   6 years Exam Location:  [facility] Procedure:      VAS [place] LOWER EXTREMITY VENOUS (DVT) Referring Phys: DAVID YAO --------------------------------------------------------------------------------  Indications: Swelling.  Risk Factors: Cancer DVT. Limitations: Poor ultrasound/tissue interface and patient positioning, patient constant movement, poor patient cooperation. Comparison Study: 05/12/2020 - Sonographic survey of the left lower extremity                   demonstrates stent                   changes of the common femoral vein, with persisting DVT of the                   left                   common femoral vein stent, occlusive. Positive duplex for DVT                   at the                   SFJ, junction  of the profunda vein, and through the femoral                   vein                   segment. Performing Technologist: Oliver Hum RVT  Examination Guidelines: A complete evaluation includes B-mode imaging, spectral Doppler, color Doppler, and power Doppler as needed of all accessible portions of each vessel. Bilateral testing is considered an integral part of a complete examination. Limited examinations for reoccurring indications may be performed as noted. The reflux portion of the exam is performed with the patient in reverse Trendelenburg.  +-----+---------------+---------+-----------+----------+--------------+ RIGHTCompressibilityPhasicitySpontaneityPropertiesThrombus Aging +-----+---------------+---------+-----------+----------+--------------+ CFV  Full           Yes      Yes                                 +-----+---------------+---------+-----------+----------+--------------+   +---------+---------------+---------+-----------+----------+-----------------+ LEFT     CompressibilityPhasicitySpontaneityPropertiesThrombus Aging    +---------+---------------+---------+-----------+----------+-----------------+ CFV  No       No                   Age Indeterminate +---------+---------------+---------+-----------+----------+-----------------+ FV Prox  Partial        Yes      Yes                  Age Indeterminate +---------+---------------+---------+-----------+----------+-----------------+ FV Mid   Full                                                           +---------+---------------+---------+-----------+----------+-----------------+ FV DistalNone           No       No                   Age Indeterminate +---------+---------------+---------+-----------+----------+-----------------+ POP      Partial        Yes      Yes                  Age Indeterminate +---------+---------------+---------+-----------+----------+-----------------+  PTV      Full                                                           +---------+---------------+---------+-----------+----------+-----------------+ PERO     None                                         Age Indeterminate +---------+---------------+---------+-----------+----------+-----------------+ Gastroc  Partial                                      Age Indeterminate +---------+---------------+---------+-----------+----------+-----------------+ EIV                     No       No                   Age Indeterminate +---------+---------------+---------+-----------+----------+-----------------+ A stent is noted in the common femoral vein. Unable to assess the CIV, or the IVC due to the patient's positioning and movement.    Summary: RIGHT: - No evidence of common femoral vein obstruction.  LEFT: - Findings consistent with age indeterminate deep vein thrombosis involving the left, external iliac vein, left common femoral vein, left femoral vein, left popliteal vein, left peroneal veins, and left gastrocnemius veins. - No cystic structure found in the popliteal fossa.  *See table(s) above for measurements and observations.    Preliminary     Assessment and Plan:  1.  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  Thrombocytopenia 3.  Metastatic bladder cancer 4.  Anemia 5.  Hypertension  -Mr. Garciaperez was previously on apixaban but now has extensive DVT with complete occlusion of his stent and almost occlusive inferior vena cava thrombus.  Recommend continuation of heparin drip.  We will ask IR to  evaluate to see if there is any intervention that they can offer.  Unsure if they can offer anything in the setting of thrombocytopenia, but will get their input. -The patient has thrombocytopenia which may be due to recent chemotherapy versus consumption from clotting.   Monitor for now. -He will resume chemotherapy as an outpatient for his metastatic bladder cancer. -Monitor hemoglobin and consider transfusion for hemoglobin less than 8.  We will check iron studies and reticulocytes with next labs.  Mikey Bussing, DNP, AGPCNP-BC, AOCNP    ADDENDUM: I saw and examined Mr. Vandewater this morning.  He is well-known to me.  He has a very tough problem.  He has the metastatic urothelial cancer.  He is really on fourth line therapy.  He is on [place].  I am sure this is why his platelet count is low.  He does have this thrombus in the left leg.  I am not sure if this is what is truly causing all the pain.  I does have a feeling that his tumor is a real problem.  I just feel that his tumor might be progressing and causing nerve compression.  I think heparin is certainly a good idea.  The question is what will we put him on as an outpatient.  I do think we need an MRI of the lumbosacral area to see how everything looks with respect to his tumor.  We had to be aggressive with his pain control.  I just do not want to see him uncomfortable and miserable.  He is trying hard.  I think his body is weakening a little bit.  Again I know he is doing all that we ask him to do.  He is incredibly motivated.  He has a lot of support from his family which is wonderful.  His family has always been such a source of strength.  I appreciate Interventional Radiology seeing him.  I am not sure what they can do to help decrease the clot burden.  Again, we will follow along.  I know he is getting incredible care from all the staff on [address].  Lattie Haw, MD  Psalms 73:26

## 2021-02-26 NOTE — Progress Notes (Signed)
Patient had another Fentanyl 100 mg patch on his right lower back. It was removed and wasted in the sharps w/ Nash Dimmer, RN.

## 2021-02-26 NOTE — Progress Notes (Signed)
CRITICAL VALUE STICKER  CRITICAL VALUE: Ptt 162 seconds  RECEIVER (on-site recipient of call): Garlon Hatchet, RN  DATE & TIME NOTIFIED: 02/26/2021 1650  MESSENGER (representative from lab): Sebastian Ache  MD NOTIFIED: Dr. Reesa Chew and  Luiz Ochoa, [facility] pharmacy notified and spoke w/ on phone  TIME OF NOTIFICATION: AMION page   RESPONSE: Repeat Ptt.    Will continue to monitor patient.

## 2021-02-26 NOTE — Progress Notes (Signed)
IR recommends transferring ptn to [facility] for possible IR intervention tomorrow, further plan will be determined after evaluated by Dr Anselm Pancoast at [facility] tomorrow.   For now will make him npo pmn.   Transfer orders placed.    Gerlean Ren MD [facility]

## 2021-02-27 DIAGNOSIS — I82422 Acute embolism and thrombosis of left iliac vein: Secondary | ICD-10-CM | POA: Diagnosis not present

## 2021-02-27 LAB — CBC
HCT: 20.5 % — ABNORMAL LOW (ref 39.0–52.0)
Hemoglobin: 6.7 g/dL — CL (ref 13.0–17.0)
MCH: 31.6 pg (ref 26.0–34.0)
MCHC: 32.7 g/dL (ref 30.0–36.0)
MCV: 96.7 fL (ref 80.0–100.0)
Platelets: 65 10*3/uL — ABNORMAL LOW (ref 150–400)
RBC: 2.12 MIL/uL — ABNORMAL LOW (ref 4.22–5.81)
RDW: 20.9 % — ABNORMAL HIGH (ref 11.5–15.5)
WBC: 14.1 10*3/uL — ABNORMAL HIGH (ref 4.0–10.5)
nRBC: 0 % (ref 0.0–0.2)

## 2021-02-27 LAB — PREPARE RBC (CROSSMATCH)

## 2021-02-27 LAB — RETICULOCYTES
Immature Retic Fract: 20.4 % — ABNORMAL HIGH (ref 2.3–15.9)
RBC.: 2.13 MIL/uL — ABNORMAL LOW (ref 4.22–5.81)
Retic Count, Absolute: 63.7 10*3/uL (ref 19.0–186.0)
Retic Ct Pct: 3 % (ref 0.4–3.1)

## 2021-02-27 LAB — BASIC METABOLIC PANEL
Anion gap: 8 (ref 5–15)
BUN: 30 mg/dL — ABNORMAL HIGH (ref 8–23)
CO2: 26 mmol/L (ref 22–32)
Calcium: 8.4 mg/dL — ABNORMAL LOW (ref 8.9–10.3)
Chloride: 104 mmol/L (ref 98–111)
Creatinine, Ser: 0.75 mg/dL (ref 0.61–1.24)
GFR, Estimated: >60 mL/min (ref >60)
Glucose, Bld: 197 mg/dL — ABNORMAL HIGH (ref 70–99)
Potassium: 3.8 mmol/L (ref 3.5–5.1)
Sodium: 138 mmol/L (ref 135–145)

## 2021-02-27 LAB — MAGNESIUM: Magnesium: 1.9 mg/dL (ref 1.7–2.4)

## 2021-02-27 LAB — APTT
aPTT: 98 seconds — ABNORMAL HIGH (ref 24–36)
aPTT: 107 seconds — ABNORMAL HIGH (ref 24–36)

## 2021-02-27 LAB — IRON AND TIBC
Iron: 23 ug/dL — ABNORMAL LOW (ref 45–182)
Saturation Ratios: 11 % — ABNORMAL LOW (ref 17.9–39.5)
TIBC: 211 ug/dL — ABNORMAL LOW (ref 250–450)
UIBC: 188 ug/dL

## 2021-02-27 LAB — HEPARIN LEVEL (UNFRACTIONATED): Heparin Unfractionated: 0.77 IU/mL — ABNORMAL HIGH (ref 0.30–0.70)

## 2021-02-27 LAB — FERRITIN: Ferritin: 416 ng/mL — ABNORMAL HIGH (ref 24–336)

## 2021-02-27 MED ORDER — SODIUM CHLORIDE 0.9% IV SOLUTION
Freq: Once | INTRAVENOUS | Status: DC
Start: 2021-02-27 — End: 2021-03-07

## 2021-02-27 NOTE — Progress Notes (Signed)
PROGRESS NOTE    Brian Benton  [id number] DOB: 05/08/1956 DOA: 02/25/2021 PCP: [facility]   Brief Narrative:  65 year old with history of metastatic bladder cancer status post TURBT, HLD, HTN, iron deficiency anemia, thrombocytopenia, tobacco use, left iliofemoral DVT on Eliquis comes to the hospital with worsening left hip pain for 3-4 days.  Lower extremity Doppler and CT abdomen pelvis with contrast showed persistent complete occlusion of indwelling left iliofemoral venous stent with interval increase of occlusive inferior vena cava thrombus.  Circumferential bladder wall thickening.  Interventional radiology was consulted for thrombectomy and patient is being transferred to [facility].  On the day of transfer his hemoglobin was noted to be 6.2 therefore units PRBC transfusion was ordered.  His anemia is thought to be iron deficient.  Hematology oncology is following.   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  Pain control and bowel regimen.  Oncology and IR is following.  Patient will likely require thrombectomy therefore IR request patient to be transferred to [facility]. - Hip x-ray negative for obvious fractures.  CT also does not show obvious fracture but does show left sacral alla metastatic erosion  History of metastatic bladder cancer Thrombocytopenia - Oncology team consulted, follows Dr Brian Benton.  Pain control, monitor platelets while on heparin drip.  Patient is not able to tolerate MRI.  Anemia of chronic disease and  iron deficiency - Iron deficiency and chemotherapy related.  No obvious evidence of bleeding.  Hemoglobin dropped to 6.7 this morning requiring 2 units of PRBC.  He states he had colonoscopy about 2 years ago and did not show any significant abnormal findings.  Hematology planning on giving IV iron sometime during this admission  Constipation - Aggressive bowel regimen  Leukocytosis - No evidence of infection.  UA is negative.  Chest x-ray does not show any active disease.  Procalcitonin 0.5.  Hold off on antibiotics at this time.  Suspect his leukocytosis is reactive.  Essential hypertension -Does not appear to be on home meds.  IV hydralazine as needed  Mild protein calorie malnutrition -Encourage oral intake  Tobacco use -Nicotine patch as needed  Superficial pressure ulcer on his right buttock area.  Advised nursing staff to put pressure dressing on it for cushioning.   DVT prophylaxis: Continue heparin drip Code Status: Full Family Communication:  Wife at bedside    Dispo: The patient is from: Home              Anticipated d/c is to: Home              Patient currently is not medically stable to d/c.  Still having significant amount of left hip pain requiring IV Dilaudid.  Patient will be transferred to [facility]   Difficult to place patient No      Subjective: States his left hip pain is better controlled today.  No obvious evidence of blood loss, denies any bleeding.  Review of Systems Otherwise negative except as per HPI, including: General: Denies fever, chills,  night sweats or unintended weight loss. Resp: Denies cough, wheezing, shortness of breath. Cardiac: Denies chest pain, palpitations, orthopnea, paroxysmal nocturnal dyspnea. GI: Denies abdominal pain, nausea, vomiting, diarrhea or constipation GU: Denies dysuria, frequency, hesitancy or incontinence MS: Denies muscle aches, joint pain or swelling Neuro: Denies headache, neurologic deficits  (focal weakness, numbness, tingling), abnormal gait Psych: Denies anxiety, depression, SI/HI/AVH Skin: Denies new rashes or lesions ID: Denies sick contacts, exotic exposures, travel  Examination:  Constitutional: Not in acute distress Respiratory: Clear to auscultation bilaterally Cardiovascular: Normal sinus rhythm, no rubs Abdomen: Nontender nondistended good bowel sounds Musculoskeletal: No edema noted Skin: Superficial pressure ulcer on his right buttock Neurologic: CN 2-12 grossly intact.  And nonfocal Psychiatric: Normal judgment and insight. Alert and oriented x 3. Normal mood.  Objective: Vitals:   02/26/21 1434 02/26/21 2118 02/27/21 0416 02/27/21 0806  BP: 123/71 (!) 131/54 115/72 (!) 109/56  Pulse: 98 93 88 76  Resp: '16 20 16 16  '$ Temp: 98.2 F (36.8 C) 99 F (37.2 C) 98.7 F (37.1 C) 97.8 F (36.6 C)  TempSrc: Oral  Oral   SpO2: 100% 97% 97% 97%  Weight:      Height:        Intake/Output Summary (Last 24 hours) at 02/27/2021 0843 Last data filed at 02/27/2021 0030 Gross per 24 hour  Intake 891.39 ml  Output 550 ml  Net 341.39 ml   Filed Weights   02/25/21 1416 02/25/21 1557  Weight: 77.1 kg 77.1 kg     Data Reviewed:   CBC: Recent Labs  Lab 02/24/21 1047 02/25/21 1639 02/26/21 0515 02/27/21 0222  WBC 20.8* 16.7* 17.6* 14.1*  NEUTROABS 18.7* 15.5*  --   --   HGB 10.1* 9.8* 8.8* 6.7*  HCT 30.8* 30.6* 27.0* 20.5*  MCV 96.0 99.0 97.5 96.7  PLT 73* 48* 49* 65*   Basic Metabolic Panel: Recent Labs  Lab 02/24/21 1047 02/25/21 1639 02/26/21 0515 02/27/21 0222  NA 133* 139 134* 138  K 4.3 4.0 3.4* 3.8  CL 99 107 99 104  CO2 '28 26 22 26  '$ GLUCOSE 101* 99 130* 197*  BUN 40* 33* 30* 30*  CREATININE 1.01 0.98 0.86 0.75  CALCIUM 9.4 9.1 8.6* 8.4*  MG  --   --   --  1.9   GFR: Estimated Creatinine Clearance: 99.1 mL/min (by C-G formula based on SCr of 0.75 mg/dL). Liver Function Tests: Recent Labs  Lab 02/24/21 1047 02/25/21 1639  AST  12* 19  ALT 19 22  ALKPHOS 57 68  BILITOT 0.7 1.2  PROT 5.7* 6.2*  ALBUMIN 3.7 3.4*   No results for input(s): LIPASE, AMYLASE in the last 168 hours. No results for input(s): AMMONIA in the last 168 hours. Coagulation Profile: Recent Labs  Lab 02/25/21 2255  INR 1.2   Cardiac Enzymes: No results for input(s): CKTOTAL, CKMB, CKMBINDEX, TROPONINI in the last 168 hours. BNP (last 3 results) No results for input(s): PROBNP in the last 8760 hours. HbA1C: No results for input(s): HGBA1C in the last 72 hours. CBG: No results for input(s): GLUCAP in the last 168 hours. Lipid Profile: No results for input(s): CHOL, HDL, LDLCALC, TRIG, CHOLHDL, LDLDIRECT in the last 72 hours. Thyroid Function Tests: No results for input(s): TSH, T4TOTAL, FREET4, T3FREE, THYROIDAB in the last 72 hours. Anemia Panel: Recent Labs    02/27/21 0222  FERRITIN 416*  TIBC 211*  IRON 23*  RETICCTPCT 3.0   Sepsis Labs: Recent Labs  Lab 02/26/21 0515  PROCALCITON 0.54    No results found for this or any previous visit (from the past 240 hour(s)).       Radiology Studies: CT ABDOMEN PELVIS W CONTRAST  Result Date: 02/25/2021 CLINICAL DATA:  Urologic cancer, staging bladder cancer with mets to bone. Prior PET-CT demonstrates Stable left retroperitoneal metastasis along the left anterior aspect of the lumbosacral junction involving the left iliopsoas musculature.  MRI demonstrates left sacral metastasis. EXAM: CT ABDOMEN AND PELVIS WITH CONTRAST CT LUMBAR SPINE WITHOUT CONTRAST TECHNIQUE: Multidetector CT imaging of the abdomen and pelvis was performed using the standard protocol following bolus administration of intravenous contrast. Multidetector CT imaging of the lumbar spine was performed without intravenous contrast administration. Multiplanar CT image reconstructions were also generated. CONTRAST:  41m OMNIPAQUE IOHEXOL 350 MG/ML SOLN COMPARISON:  PET-CT 09/29/2020, MRI lumbar spine 01/04/2021, ct  abdomen/pelvis 05/18/20 FINDINGS: CT abdomen/pelvis: Lower chest: Linear atelectasis versus scarring of the right lower lobe. Limited evaluation due to motion artifact. No acute abnormality. Hepatobiliary: No focal liver abnormality. No gallstones, gallbladder wall thickening, or pericholecystic fluid. No biliary dilatation. Pancreas: No focal lesion. Normal pancreatic contour. No surrounding inflammatory changes. No main pancreatic ductal dilatation. Spleen: Normal in size without focal abnormality. Adrenals/Urinary Tract: No adrenal nodule bilaterally. Bilateral kidneys enhance symmetrically. There is a 1.7 cm fluid density lesion within the right kidney that likely represents a simple renal cyst. Similar findings are noted within the left kidney measuring to 2.3 cm. Subcentimeter hypodensities are too small to characterize. No hydronephrosis. No hydroureter. Circumferential urinary bladder wall thickening. Stomach/Bowel: Stomach is within normal limits. No evidence of bowel wall thickening or dilatation. Stool throughout the colon. Appendix appears normal. 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 There is a left common iliac to external iliac to femoral vein stent. No abdominal aorta or iliac aneurysm. Severe calcified and noncalcified atherosclerotic plaque of the aorta and its branches. No abdominal, pelvic, or inguinal lymphadenopathy. Reproductive: Prostate is unremarkable. Other: No intraperitoneal free fluid. No intraperitoneal free gas. No organized fluid collection. Musculoskeletal: Similar-appearing approximately 6 cm soft tissue density along the iliopsoas muscle with associated poorly visualized erosion of the left sacral ala (2:57). No suspicious lytic or blastic osseous lesions. No acute displaced fracture. Multilevel degenerative changes of the  spine. CT lumbar spine: Segmentation: 5 lumbar type vertebrae. Alignment: Normal. Vertebrae: Mild degenerative changes. No acute fracture or focal pathologic process. Paraspinal and other soft tissues: Negative. IMPRESSION: 1. Limited evaluation of the upper abdomen, pelvis, spine due to motion artifact. 2. 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 3. Circumferential urinary bladder wall thickening. 4. Similar-appearing known left sacral al a metastasis/erosion with associated iliopsoas soft tissue density. 5. Constipation. 6. No acute displaced fracture or traumatic listhesis of the lumbar spine. Electronically Signed   By: MIven FinnM.D.   On: 02/25/2021 20:51   CT L-SPINE NO CHARGE  Result Date: 02/25/2021 CLINICAL DATA:  Urologic cancer, staging bladder cancer with mets to bone. Prior PET-CT demonstrates Stable left retroperitoneal metastasis along the left anterior aspect of the lumbosacral junction involving the left iliopsoas musculature.  MRI demonstrates left sacral metastasis. EXAM: CT ABDOMEN AND PELVIS WITH CONTRAST CT LUMBAR SPINE WITHOUT CONTRAST TECHNIQUE: Multidetector CT imaging of the abdomen and pelvis was performed using the standard protocol following  bolus administration of intravenous contrast. Multidetector CT imaging of the lumbar spine was performed without intravenous contrast administration. Multiplanar CT image reconstructions were also generated. CONTRAST:  66m OMNIPAQUE IOHEXOL 350 MG/ML SOLN COMPARISON:  PET-CT 09/29/2020, MRI lumbar spine 01/04/2021, ct abdomen/pelvis 05/18/20 FINDINGS: CT abdomen/pelvis: Lower chest: Linear atelectasis versus scarring of the right lower lobe. Limited evaluation due to motion artifact. No acute abnormality. Hepatobiliary: No focal liver abnormality. No gallstones, gallbladder wall thickening, or pericholecystic fluid. No biliary  dilatation. Pancreas: No focal lesion. Normal pancreatic contour. No surrounding inflammatory changes. No main pancreatic ductal dilatation. Spleen: Normal in size without focal abnormality. Adrenals/Urinary Tract: No adrenal nodule bilaterally. Bilateral kidneys enhance symmetrically. There is a 1.7 cm fluid density lesion within the right kidney that likely represents a simple renal cyst. Similar findings are noted within the left kidney measuring to 2.3 cm. Subcentimeter hypodensities are too small to characterize. No hydronephrosis. No hydroureter. Circumferential urinary bladder wall thickening. Stomach/Bowel: Stomach is within normal limits. No evidence of bowel wall thickening or dilatation. Stool throughout the colon. Appendix appears normal. 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 There is a left common iliac to external iliac to femoral vein stent. No abdominal aorta or iliac aneurysm. Severe calcified and noncalcified atherosclerotic plaque of the aorta and its branches. No abdominal, pelvic, or inguinal lymphadenopathy. Reproductive: Prostate is unremarkable. Other: No intraperitoneal free fluid. No intraperitoneal free gas. No organized fluid collection. Musculoskeletal: Similar-appearing approximately 6 cm soft tissue density along the iliopsoas muscle with associated poorly visualized erosion of the left sacral ala (2:57). No suspicious lytic or blastic osseous lesions. No acute displaced fracture. Multilevel degenerative changes of the spine. CT lumbar spine: Segmentation: 5 lumbar type vertebrae. Alignment: Normal. Vertebrae: Mild degenerative changes. No acute fracture or focal pathologic process. Paraspinal and other soft tissues: Negative. IMPRESSION: 1. Limited evaluation of the upper abdomen, pelvis, spine due to motion artifact. 2.  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 3. Circumferential urinary bladder wall thickening. 4. Similar-appearing known left sacral al a metastasis/erosion with associated iliopsoas soft tissue density. 5. Constipation. 6. No acute displaced fracture or traumatic listhesis of the lumbar spine. Electronically Signed   By: MIven FinnM.D.   On: 02/25/2021 20:51   DG Chest Port 1 View  Result Date: 02/26/2021 CLINICAL DATA:  Confusion, weakness EXAM: PORTABLE CHEST 1 VIEW COMPARISON:  03/15/2012 FINDINGS: Right Port-A-Cath in place with the tip in the right atrium. Heart is normal size. Lungs clear. No effusions or acute bony abnormality. IMPRESSION: No active disease. Electronically Signed   By: KRolm BaptiseM.D.   On: 02/26/2021 09:12   DG Hip Unilat W or Wo Pelvis 2-3 Views Left  Result Date: 02/25/2021 CLINICAL DATA:  Left hip pain EXAM: DG HIP (WITH OR WITHOUT PELVIS) 2-3V LEFT COMPARISON:  None. FINDINGS: Limited study by positioning. The patient would not lie flat for imaging. No definite visible acute bony abnormality. Degenerative changes in the hips bilaterally. IMPRESSION: Limited study by positioning. The patient would not lie flat. No definite visible fracture. Electronically Signed   By: KRolm BaptiseM.D.   On: 02/25/2021 15:36   VAS [place]OWER EXTREMITY VENOUS (DVT) (ONLY [facility] & [facility])  Result Date: 02/25/2021  Lower Venous DVT Study Patient Name:  EAHMIR DELANGEL  Sturges  Date of Exam:   02/25/2021 Medical Rec #: [id number]        Accession #:    [id number] Date of Birth: 01-Oct-1955        Patient Gender: M Patient Age:   89 years Exam Location:  [facility] Procedure:      VAS [place] LOWER EXTREMITY VENOUS (DVT) Referring Phys: DAVID YAO --------------------------------------------------------------------------------  Indications: Swelling.  Risk Factors: Cancer DVT. Limitations: Poor  ultrasound/tissue interface and patient positioning, patient constant movement, poor patient cooperation. Comparison Study: 05/12/2020 - Sonographic survey of the left lower extremity                   demonstrates stent                   changes of the common femoral vein, with persisting DVT of the                   left                   common femoral vein stent, occlusive. Positive duplex for DVT                   at the                   SFJ, junction of the profunda vein, and through the femoral                   vein                   segment. Performing Technologist: Oliver Hum RVT  Examination Guidelines: A complete evaluation includes B-mode imaging, spectral Doppler, color Doppler, and power Doppler as needed of all accessible portions of each vessel. Bilateral testing is considered an integral part of a complete examination. Limited examinations for reoccurring indications may be performed as noted. The reflux portion of the exam is performed with the patient in reverse Trendelenburg.  +-----+---------------+---------+-----------+----------+--------------+ RIGHTCompressibilityPhasicitySpontaneityPropertiesThrombus Aging +-----+---------------+---------+-----------+----------+--------------+ CFV  Full           Yes      Yes                                 +-----+---------------+---------+-----------+----------+--------------+   +---------+---------------+---------+-----------+----------+-----------------+ LEFT     CompressibilityPhasicitySpontaneityPropertiesThrombus Aging    +---------+---------------+---------+-----------+----------+-----------------+ CFV                     No       No                   Age Indeterminate +---------+---------------+---------+-----------+----------+-----------------+ FV Prox  Partial        Yes      Yes                  Age Indeterminate +---------+---------------+---------+-----------+----------+-----------------+ FV Mid   Full                                                            +---------+---------------+---------+-----------+----------+-----------------+ FV DistalNone           No       No  Age Indeterminate +---------+---------------+---------+-----------+----------+-----------------+ POP      Partial        Yes      Yes                  Age Indeterminate +---------+---------------+---------+-----------+----------+-----------------+ PTV      Full                                                           +---------+---------------+---------+-----------+----------+-----------------+ PERO     None                                         Age Indeterminate +---------+---------------+---------+-----------+----------+-----------------+ Gastroc  Partial                                      Age Indeterminate +---------+---------------+---------+-----------+----------+-----------------+ EIV                     No       No                   Age Indeterminate +---------+---------------+---------+-----------+----------+-----------------+ A stent is noted in the common femoral vein. Unable to assess the CIV, or the IVC due to the patient's positioning and movement.    Summary: RIGHT: - No evidence of common femoral vein obstruction.  LEFT: - Findings consistent with age indeterminate deep vein thrombosis involving the left, external iliac vein, left common femoral vein, left femoral vein, left popliteal vein, left peroneal veins, and left gastrocnemius veins. - No cystic structure found in the popliteal fossa.  *See table(s) above for measurements and observations.    Preliminary         Scheduled Meds:  sodium chloride   Intravenous Once   aspirin EC  81 mg Oral Daily   Chlorhexidine Gluconate Cloth  6 each Topical Daily   DULoxetine  30 mg Oral QHS   Followed by   Derrill Memo ON 03/01/2021] DULoxetine  60 mg Oral QHS   fentaNYL  1 patch Transdermal Q48H   pantoprazole  40  mg Oral BID   polyethylene glycol  17 g Oral BID   pregabalin  50 mg Oral BID   Followed by   Derrill Memo ON 03/01/2021] pregabalin  50 mg Oral TID   sodium chloride flush  10-40 mL Intracatheter Q12H   Continuous Infusions:  sodium chloride Stopped (02/25/21 2247)   heparin 1,200 Units/hr (02/26/21 2007)     LOS: 1 day   Time spent= 35 mins    Jarquez Mestre Arsenio Loader, MD [facility]  If 7PM-7AM, please contact night-coverage  02/27/2021, 8:43 AM

## 2021-02-27 NOTE — Progress Notes (Signed)
[place] for Heparin Indication: L iliofemoral DVT with occlusion of IVC  No Known Allergies  Patient Measurements: Height: 6' (182.9 cm) Weight: 77.1 kg (170 lb) IBW/kg (Calculated) : 77.6 Heparin Dosing Weight: 77.1 kg  Vital Signs: Temp: 99 F (37.2 C) (09/16 2118) BP: 131/54 (09/16 2118) Pulse Rate: 93 (09/16 2118)  Labs: Recent Labs    02/25/21 1639 02/25/21 1639 02/25/21 2255 02/26/21 0515 02/26/21 0656 02/26/21 1519 02/26/21 1820 02/27/21 0222  HGB 9.8*  --   --  8.8*  --   --   --  6.7*  HCT 30.6*  --   --  27.0*  --   --   --  20.5*  PLT 48*  --   --  49*  --   --   --  65*  APTT  --    < > 40*  --  117* 162* 104* 98*  LABPROT  --   --  15.2  --   --   --   --   --   INR  --   --  1.2  --   --   --   --   --   HEPARINUNFRC  --    < > >1.10*  --  1.05* 1.03*  --  0.77*  CREATININE 0.98  --   --  0.86  --   --   --  0.75   < > = values in this interval not displayed.    Estimated Creatinine Clearance: 99.1 mL/min (by C-G formula based on SCr of 0.75 mg/dL).  Medical History: Past Medical History:  Diagnosis Date   Cancer [facility])    bladder   History of bladder cancer    s/p  TURBT 07-25-2014   Hyperlipidemia    Mass of left hand    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 ([place]) 03/06/2020    Assessment: 65 y/o M with hx of LLE DVT, on apixaban 5 mg BID PTA, on chemotherapy for metastatic bladder cancer, presented with L hip pain and on CT was found to have complete occlusion of indwelling L iliofemoral venous stent with interval increase of an almost occlusive inferior vena cava thrombus.  Pltc low at 48K.  Orders received to begin IV heparin (no loading/bolus dose). Note that recent DOAC therapy can falsely elevate heparin levels, so will need to use aPTT for heparin rate titration until aPTT correlates with heparin level.  02/27/2021: Heparin level = 0.77 units/mL, remains falsely elevated but trending down due to  recent DOAC use aPTT = 98 seconds, therapeutic on heparin 1200 units/hr Per RN, labs drawn peripherally Confirmed with RN that heparin infusing at specified rate of 1200 units/hr No bleeding reported. CBC: Hgb low/decreased to 6.7. Platelet count remains low but improved to 65. Concomitant low-dose ASA noted.  Goal of Therapy:  aPTT 66 -102 seconds  Heparin level 0.3-0.7 units/mL Monitor platelets by anticoagulation protocol: Yes   Plan:  Continue IV heparin infusion at 1200 units/hr Repeat aPTT in 6 hours (peripheral stick in opposite arm from where heparin infusing) Daily CBC, aPTT, heparin level Monitor closely for s/x of bleeding F/u IR plans   Netta Cedars, PharmD, [place] [phone] 02/27/2021  4:07 AM

## 2021-02-27 NOTE — Progress Notes (Signed)
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  No Known Allergies  Patient Measurements: Height: 6' (182.9 cm) Weight: 77.1 kg (170 lb) IBW/kg (Calculated) : 77.6 Heparin Dosing Weight: 77.1 kg  Vital Signs: Temp: 98.2 F (36.8 C) (09/17 0900) Temp Source: Oral (09/17 0900) BP: 97/57 (09/17 0900) Pulse Rate: 79 (09/17 0900)  Labs: Recent Labs    02/25/21 1639 02/25/21 1639 02/25/21 2255 02/26/21 0515 02/26/21 0656 02/26/21 1519 02/26/21 1820 02/27/21 0222  HGB 9.8*  --   --  8.8*  --   --   --  6.7*  HCT 30.6*  --   --  27.0*  --   --   --  20.5*  PLT 48*  --   --  49*  --   --   --  65*  APTT  --    < > 40*  --  117* 162* 104* 98*  LABPROT  --   --  15.2  --   --   --   --   --   INR  --   --  1.2  --   --   --   --   --   HEPARINUNFRC  --    < > >1.10*  --  1.05* 1.03*  --  0.77*  CREATININE 0.98  --   --  0.86  --   --   --  0.75   < > = values in this interval not displayed.    Estimated Creatinine Clearance: 99.1 mL/min (by C-G formula based on SCr of 0.75 mg/dL).  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  Pltc low at 48K.  Orders received to begin IV heparin (no loading/bolus dose). Note that recent DOAC therapy can falsely elevate heparin levels, so will need to use aPTT for heparin rate titration until aPTT correlates with heparin level.  Today, 02/27/2021: - aPTT is therapeutic at 98 secs - hgb  down 6.7 (2 units PRBC ordered for 9/17) - no bleeding documented - IR suspects clot is tumor thrombus. No invasive intervention at this time   Goal of Therapy:  aPTT 66 -102 seconds  Heparin level 0.3-0.7 units/mL Monitor platelets by anticoagulation protocol: Yes   Plan:  - Continue IV heparin infusion at 1200 units/hr - Daily CBC, aPTT, heparin level - Monitor closely for s/x of bleeding   Dia Sitter, PharmD, BCPS 02/27/2021 9:24 AM

## 2021-02-27 NOTE — Progress Notes (Signed)
Spoke with Dr. Anselm Pancoast from IR again, who was able to review patient's previous scans and thinks most of his clot burden is tumor related versus blood clot.  At this time there is no role for mechanical thrombectomy or any procedures.  We will need to focus on malignancy treatment.  Unfortunately his malignancy is advanced causing pain and discomfort.  Pain control and bowel regimen ordered.  Spoke with Dr. Marin Olp as well who recommends pain control and consulting palliative care for now.  He may still eventually need MRI of his lumbar spine under general anesthesia.  Depending on palliative care conversation, arrangements may need to be made to transfer him to [facility] for MRI under general anesthesia depending on their availability.  Patient's wife has been updated by me.  We will keep the patient at [facility] and continue current treatment plan. Appreciate input from interventional radiology and oncology. Palliative care consultation has been placed.  Gerlean Ren MD [facility]

## 2021-02-27 NOTE — Progress Notes (Addendum)
Chief Complaint: Patient was seen today for follow-up of occluded left iliac stents and IVC thrombus  Referring Physician(s): Ennever, Peter  Subjective: Brian Benton is a 65 y.o. 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  Patient recently presented with worsening left hip and thigh pain.  CT imaging demonstrated occlusion of the left iliac stents along with increased thrombus in the IVC.  Patient has been on IV heparin and says that he is feeling better but still has left hip and leg pain.  He does not complain of leg swelling.  However, he does have swelling and bruising in the right foot which has gotten better.  Objective: Physical Exam: BP (!) 97/57   Pulse 79   Temp 98.2 F (36.8 C) (Oral)   Resp 16   Ht 6' (1.829 m)   Wt 77.1 kg   SpO2 98%   BMI 23.06 kg/m   General: Alert and no acute acute distress. Left lower extremity: Mild swelling at the left ankle and foot.  2+ pulses in left DP and PT. Right lower extremity: Swelling in the right foot with ecchymosis on the dorsal and plantar aspect of the right foot.  2+ pulses in the right DP and PT.   Current Facility-Administered Medications:    0.9 %  sodium chloride infusion (Manually program via Guardrails IV Fluids), , Intravenous, Once, Blount, Scarlette Shorts T, NP   0.9 %  sodium chloride infusion, , Intravenous, Continuous, Reubin Milan, MD, Held at 02/25/21 2247   acetaminophen (TYLENOL) tablet 650 mg, 650 mg, Oral, Q6H PRN **OR** acetaminophen (TYLENOL) suppository 650 mg, 650 mg, Rectal, Q6H PRN, Reubin Milan, MD   aspirin EC tablet 81 mg, 81 mg, Oral, Daily, Reubin Milan, MD, 81 mg at 02/26/21 0957   Chlorhexidine Gluconate Cloth 2 % PADS 6 each, 6 each, Topical, Daily, Curcio, Kristin R, NP, 6 each at 02/26/21 1247   DULoxetine (CYMBALTA) DR capsule 30 mg, 30 mg, Oral, QHS, 30 mg at 02/27/21 0026  **FOLLOWED BY** [START ON 03/01/2021] DULoxetine (CYMBALTA) DR capsule 60 mg, 60 mg, Oral, QHS, Reubin Milan, MD   fentaNYL (DURAGESIC) 100 MCG/HR 1 patch, 1 patch, Transdermal, Q48H, Reubin Milan, MD, 1 patch at 02/26/21 0820   guaiFENesin (ROBITUSSIN) 100 MG/5ML solution 100 mg, 5 mL, Oral, Q4H PRN, Amin, Ankit Chirag, MD   heparin ADULT infusion 100 units/mL (25000 units/235m), 1,200 Units/hr, Intravenous, Continuous, Gadhia, Jigna M, RPH, Last Rate: 12 mL/hr at 02/26/21 2007, 1,200 Units/hr at 02/26/21 2007   hydrALAZINE (APRESOLINE) injection 10 mg, 10 mg, Intravenous, Q4H PRN, Amin, Ankit Chirag, MD   HYDROmorphone (DILAUDID) injection 2 mg, 2 mg, Intravenous, Q2H PRN, EVolanda Napoleon MD, 2 mg at 02/26/21 1721   ipratropium-albuterol (DUONEB) 0.5-2.5 (3) MG/3ML nebulizer solution 3 mL, 3 mL, Nebulization, Q4H PRN, Amin, Ankit Chirag, MD   lactulose (CHRONULAC) 10 GM/15ML solution 30 g, 30 g, Oral, Q4H PRN, OReubin Milan MD   LORazepam (ATIVAN) tablet 1 mg, 1 mg, Oral, Q6H PRN, Amin, Ankit Chirag, MD   metoprolol tartrate (LOPRESSOR) injection 5 mg, 5 mg, Intravenous, Q4H PRN, Amin, Ankit Chirag, MD   ondansetron (ZOFRAN) injection 4 mg, 4 mg, Intravenous, Q6H PRN, OReubin Milan MD   oxyCODONE (Oxy IR/ROXICODONE) immediate release tablet 10 mg, 10 mg, Oral, Q3H PRN, OReubin Milan MD, 10 mg at 02/27/21 0852   pantoprazole ([place]  EC tablet 40 mg, 40 mg, Oral, BID, Reubin Milan, MD, 40 mg at 02/27/21 0029   polyethylene glycol (MIRALAX / GLYCOLAX) packet 17 g, 17 g, Oral, BID, Amin, Ankit Chirag, MD   pregabalin (LYRICA) capsule 50 mg, 50 mg, Oral, BID, 50 mg at 02/27/21 0029 **FOLLOWED BY** [START ON 03/01/2021] pregabalin (LYRICA) capsule 50 mg, 50 mg, Oral, TID, Reubin Milan, MD   prochlorperazine (COMPAZINE) tablet 10 mg, 10 mg, Oral, Q6H PRN, Amin, Ankit Chirag, MD   sodium chloride flush (NS) 0.9 % injection 10-40 mL, 10-40 mL,  Intracatheter, Q12H, Curcio, Kristin R, NP, 10 mL at 02/26/21 1246   sodium chloride flush (NS) 0.9 % injection 10-40 mL, 10-40 mL, Intracatheter, PRN, Curcio, Kristin R, NP   sodium chloride flush (NS) 0.9 % injection 10-40 mL, 10-40 mL, Intracatheter, PRN, Amin, Ankit Chirag, MD   traZODone (DESYREL) tablet 50 mg, 50 mg, Oral, QHS PRN, Amin, Ankit Chirag, MD, 50 mg at 02/27/21 0036  Labs: CBC Recent Labs    02/26/21 0515 02/27/21 0222  WBC 17.6* 14.1*  HGB 8.8* 6.7*  HCT 27.0* 20.5*  PLT 49* 65*   BMET Recent Labs    02/26/21 0515 02/27/21 0222  NA 134* 138  K 3.4* 3.8  CL 99 104  CO2 22 26  GLUCOSE 130* 197*  BUN 30* 30*  CREATININE 0.86 0.75  CALCIUM 8.6* 8.4*   LFT Recent Labs    02/25/21 1639  PROT 6.2*  ALBUMIN 3.4*  AST 19  ALT 22  ALKPHOS 68  BILITOT 1.2   PT/INR Recent Labs    02/25/21 2255  LABPROT 15.2  INR 1.2     Studies/Results: CT ABDOMEN PELVIS W CONTRAST  Result Date: 02/25/2021 CLINICAL DATA:  Urologic cancer, staging bladder cancer with mets to bone. Prior PET-CT demonstrates Stable left retroperitoneal metastasis along the left anterior aspect of the lumbosacral junction involving the left iliopsoas musculature.  MRI demonstrates left sacral metastasis. EXAM: CT ABDOMEN AND PELVIS WITH CONTRAST CT LUMBAR SPINE WITHOUT CONTRAST TECHNIQUE: Multidetector CT imaging of the abdomen and pelvis was performed using the standard protocol following bolus administration of intravenous contrast. Multidetector CT imaging of the lumbar spine was performed without intravenous contrast administration. Multiplanar CT image reconstructions were also generated. CONTRAST:  26m OMNIPAQUE IOHEXOL 350 MG/ML SOLN COMPARISON:  PET-CT 09/29/2020, MRI lumbar spine 01/04/2021, ct abdomen/pelvis 05/18/20 FINDINGS: CT abdomen/pelvis: Lower chest: Linear atelectasis versus scarring of the right lower lobe. Limited evaluation due to motion artifact. No acute abnormality.  Hepatobiliary: No focal liver abnormality. No gallstones, gallbladder wall thickening, or pericholecystic fluid. No biliary dilatation. Pancreas: No focal lesion. Normal pancreatic contour. No surrounding inflammatory changes. No main pancreatic ductal dilatation. Spleen: Normal in size without focal abnormality. Adrenals/Urinary Tract: No adrenal nodule bilaterally. Bilateral kidneys enhance symmetrically. There is a 1.7 cm fluid density lesion within the right kidney that likely represents a simple renal cyst. Similar findings are noted within the left kidney measuring to 2.3 cm. Subcentimeter hypodensities are too small to characterize. No hydronephrosis. No hydroureter. Circumferential urinary bladder wall thickening. Stomach/Bowel: Stomach is within normal limits. No evidence of bowel wall thickening or dilatation. Stool throughout the colon. Appendix appears normal. 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 There is a left common iliac to external iliac to femoral vein stent. No abdominal aorta or iliac aneurysm. Severe  calcified and noncalcified atherosclerotic plaque of the aorta and its branches. No abdominal, pelvic, or inguinal lymphadenopathy. Reproductive: Prostate is unremarkable. Other: No intraperitoneal free fluid. No intraperitoneal free gas. No organized fluid collection. Musculoskeletal: Similar-appearing approximately 6 cm soft tissue density along the iliopsoas muscle with associated poorly visualized erosion of the left sacral ala (2:57). No suspicious lytic or blastic osseous lesions. No acute displaced fracture. Multilevel degenerative changes of the spine. CT lumbar spine: Segmentation: 5 lumbar type vertebrae. Alignment: Normal. Vertebrae: Mild degenerative changes. No acute fracture or focal pathologic process. Paraspinal and other  soft tissues: Negative. IMPRESSION: 1. Limited evaluation of the upper abdomen, pelvis, spine due to motion artifact. 2. 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 3. Circumferential urinary bladder wall thickening. 4. Similar-appearing known left sacral al a metastasis/erosion with associated iliopsoas soft tissue density. 5. Constipation. 6. No acute displaced fracture or traumatic listhesis of the lumbar spine. Electronically Signed   By: Iven Finn M.D.   On: 02/25/2021 20:51   CT L-SPINE NO CHARGE  Result Date: 02/25/2021 CLINICAL DATA:  Urologic cancer, staging bladder cancer with mets to bone. Prior PET-CT demonstrates Stable left retroperitoneal metastasis along the left anterior aspect of the lumbosacral junction involving the left iliopsoas musculature.  MRI demonstrates left sacral metastasis. EXAM: CT ABDOMEN AND PELVIS WITH CONTRAST CT LUMBAR SPINE WITHOUT CONTRAST TECHNIQUE: Multidetector CT imaging of the abdomen and pelvis was performed using the standard protocol following bolus administration of intravenous contrast. Multidetector CT imaging of the lumbar spine was performed without intravenous contrast administration. Multiplanar CT image reconstructions were also generated. CONTRAST:  60m OMNIPAQUE IOHEXOL 350 MG/ML SOLN COMPARISON:  PET-CT 09/29/2020, MRI lumbar spine 01/04/2021, ct abdomen/pelvis 05/18/20 FINDINGS: CT abdomen/pelvis: Lower chest: Linear atelectasis versus scarring of the right lower lobe. Limited evaluation due to motion artifact. No acute abnormality. Hepatobiliary: No focal liver abnormality. No gallstones, gallbladder wall thickening, or pericholecystic fluid. No biliary dilatation. Pancreas: No focal lesion. Normal pancreatic contour. No surrounding inflammatory changes. No main pancreatic ductal dilatation. Spleen: Normal in size without focal abnormality.  Adrenals/Urinary Tract: No adrenal nodule bilaterally. Bilateral kidneys enhance symmetrically. There is a 1.7 cm fluid density lesion within the right kidney that likely represents a simple renal cyst. Similar findings are noted within the left kidney measuring to 2.3 cm. Subcentimeter hypodensities are too small to characterize. No hydronephrosis. No hydroureter. Circumferential urinary bladder wall thickening. Stomach/Bowel: Stomach is within normal limits. No evidence of bowel wall thickening or dilatation. Stool throughout the colon. Appendix appears normal. 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 There is a left common iliac to external iliac to femoral vein stent. No abdominal aorta or iliac aneurysm. Severe calcified and noncalcified atherosclerotic plaque of the aorta and its branches. No abdominal, pelvic, or inguinal lymphadenopathy. Reproductive: Prostate is unremarkable. Other: No intraperitoneal free fluid. No intraperitoneal free gas. No organized fluid collection. Musculoskeletal: Similar-appearing approximately 6 cm soft tissue density along the iliopsoas muscle with associated poorly visualized erosion of the left sacral ala (2:57). No suspicious lytic or blastic osseous lesions. No acute displaced fracture. Multilevel degenerative changes of the spine. CT lumbar spine: Segmentation: 5 lumbar type vertebrae. Alignment: Normal. Vertebrae: Mild degenerative changes. No acute fracture or focal pathologic process. Paraspinal and other soft tissues: Negative. IMPRESSION: 1.  Limited evaluation of the upper abdomen, pelvis, spine due to motion artifact. 2. 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 3. Circumferential urinary bladder wall  thickening. 4. Similar-appearing known left sacral al a metastasis/erosion with associated iliopsoas soft tissue density. 5. Constipation. 6. No acute displaced fracture or traumatic listhesis of the lumbar spine. Electronically Signed   By: Iven Finn M.D.   On: 02/25/2021 20:51   DG Chest Port 1 View  Result Date: 02/26/2021 CLINICAL DATA:  Confusion, weakness EXAM: PORTABLE CHEST 1 VIEW COMPARISON:  03/15/2012 FINDINGS: Right Port-A-Cath in place with the tip in the right atrium. Heart is normal size. Lungs clear. No effusions or acute bony abnormality. IMPRESSION: No active disease. Electronically Signed   By: Rolm Baptise M.D.   On: 02/26/2021 09:12   DG Hip Unilat W or Wo Pelvis 2-3 Views Left  Result Date: 02/25/2021 CLINICAL DATA:  Left hip pain EXAM: DG HIP (WITH OR WITHOUT PELVIS) 2-3V LEFT COMPARISON:  None. FINDINGS: Limited study by positioning. The patient would not lie flat for imaging. No definite visible acute bony abnormality. Degenerative changes in the hips bilaterally. IMPRESSION: Limited study by positioning. The patient would not lie flat. No definite visible fracture. Electronically Signed   By: Rolm Baptise M.D.   On: 02/25/2021 15:36   VAS [place] LOWER EXTREMITY VENOUS (DVT) (ONLY [facility] & [facility])  Result Date: 02/25/2021  Lower Venous DVT Study Patient Name:  BASSEL RUDISILL  Date of Exam:   02/25/2021 Medical Rec #: [id number]        Accession #:    [id number] Date of Birth: December 20, 1955        Patient Gender: M Patient Age:   4 years Exam Location:  [facility] Procedure:      VAS [place] LOWER EXTREMITY VENOUS (DVT) Referring Phys: DAVID YAO --------------------------------------------------------------------------------  Indications: Swelling.  Risk Factors: Cancer DVT. Limitations: Poor ultrasound/tissue interface and patient positioning, patient constant movement, poor patient cooperation. Comparison Study: 05/12/2020 - Sonographic survey of the left lower extremity                    demonstrates stent                   changes of the common femoral vein, with persisting DVT of the                   left                   common femoral vein stent, occlusive. Positive duplex for DVT                   at the                   SFJ, junction of the profunda vein, and through the femoral                   vein                   segment. Performing Technologist: Oliver Hum RVT  Examination Guidelines: A complete evaluation includes B-mode imaging, spectral Doppler, color Doppler, and power Doppler as needed of all accessible portions of each vessel. Bilateral testing is considered an integral part of a complete examination. Limited examinations for reoccurring indications may be performed as noted. The reflux portion of  the exam is performed with the patient in reverse Trendelenburg.  +-----+---------------+---------+-----------+----------+--------------+ RIGHTCompressibilityPhasicitySpontaneityPropertiesThrombus Aging +-----+---------------+---------+-----------+----------+--------------+ CFV  Full           Yes      Yes                                 +-----+---------------+---------+-----------+----------+--------------+   +---------+---------------+---------+-----------+----------+-----------------+ LEFT     CompressibilityPhasicitySpontaneityPropertiesThrombus Aging    +---------+---------------+---------+-----------+----------+-----------------+ CFV                     No       No                   Age Indeterminate +---------+---------------+---------+-----------+----------+-----------------+ FV Prox  Partial        Yes      Yes                  Age Indeterminate +---------+---------------+---------+-----------+----------+-----------------+ FV Mid   Full                                                           +---------+---------------+---------+-----------+----------+-----------------+ FV DistalNone           No       No                    Age Indeterminate +---------+---------------+---------+-----------+----------+-----------------+ POP      Partial        Yes      Yes                  Age Indeterminate +---------+---------------+---------+-----------+----------+-----------------+ PTV      Full                                                           +---------+---------------+---------+-----------+----------+-----------------+ PERO     None                                         Age Indeterminate +---------+---------------+---------+-----------+----------+-----------------+ Gastroc  Partial                                      Age Indeterminate +---------+---------------+---------+-----------+----------+-----------------+ EIV                     No       No                   Age Indeterminate +---------+---------------+---------+-----------+----------+-----------------+ A stent is noted in the common femoral vein. Unable to assess the CIV, or the IVC due to the patient's positioning and movement.    Summary: RIGHT: - No evidence of common femoral vein obstruction.  LEFT: - Findings consistent with age indeterminate deep vein thrombosis involving the left, external iliac vein, left common femoral vein, left femoral vein, left popliteal vein, left peroneal veins, and left gastrocnemius veins. - No cystic structure  found in the popliteal fossa.  *See table(s) above for measurements and observations.    Preliminary     Assessment/Plan: 65 year old with a complex situation. Metastatic bladder cancer with recurrent occlusion of the left iliac venous stents and enlarging thrombus in the IVC.  Interestingly, the patient only has mild lower extremity swelling, predominantly in the feet.  Patient continues to complain of left hip pain.  I have extensively reviewed the patient's previous imaging examinations.  Patient has had IVC thrombus on previous examinations but it appears that the thrombus has enlarged  despite the patient being on anticoagulation.  Based on the patient's physical examination, I am less worried about the left iliac vein stent occlusions because of the minimal symptoms in the left lower extremity.  However, I am concerned about the enlarging thrombus in the infrarenal IVC.  We were considering suction thrombectomy of the IVC thrombus but upon further review of the PET CT from 01/06/21, I believe that the IVC and iliac vein thrombus is actually tumor thrombus.  Tumor thrombus would explain why the thrombus has enlarged despite being on anticoagulation.  Patient was scheduled to have a MRI of spine and this may be very helpful if we can image the IVC thrombus at the same time to look for contrast enhancement.  Recommend continuing with IV heparin but may also need to repeat CT if the hemoglobin continues to trend down.  No plans for intervention at this time and IR will follow while in hospital.    LOS: 1 day   I spent a total of 20 minutes in face to face in clinical consultation, greater than 50% of which was counseling/coordinating care for venous thrombosis.  Burman Riis MD 02/27/2021 10:03 AM

## 2021-02-27 NOTE — Progress Notes (Signed)
Brian Benton appears to be a little more comfortable.  I am unsure if this is from the heparin or if this is just increasing the pain medication.  Interventional Radiology has seen him.  He is n.p.o. according to the site on the door.  From what he says, he is being taken over to [facility] for a procedure.  I am unsure if this is a thrombectomy.  He is quite anemic now.  Hemoglobin 6.7.  He will get 2 units of blood.  He is quite iron deficient.  At some point will have to give him some IV iron.  He we will try to have the MRI yesterday but could not because of pain.  Cannot lie on his back.  We actually may have to do an MRI under general anesthesia.  Again, he does appear to be more comfortable.  His wife was with him this morning.  His electrolytes show sodium of 138.  Potassium 3.8.  BUN 30 creatinine 0.75.  Pharmacy is doing a good job managing his heparin.  His platelet count is coming up.  I think this is reflective of the chemotherapy working its way out of his system.  Hopefully, we will be able to somehow help with these blood clots.  Hopefully he may have a mechanical thrombectomy.  I suspect that he is going need to have the blood transfusion.  I will not adjust medicines for his pain right now.  He does seem to be a little more comfortable.  I do appreciate the wonderful care that he is getting from the great staff on [address].  Lattie Haw, MD  [place] 12:12

## 2021-02-28 ENCOUNTER — Inpatient Hospital Stay (HOSPITAL_COMMUNITY): Payer: 59

## 2021-02-28 DIAGNOSIS — Z7189 Other specified counseling: Secondary | ICD-10-CM

## 2021-02-28 DIAGNOSIS — I82422 Acute embolism and thrombosis of left iliac vein: Secondary | ICD-10-CM | POA: Diagnosis not present

## 2021-02-28 DIAGNOSIS — R52 Pain, unspecified: Secondary | ICD-10-CM | POA: Diagnosis not present

## 2021-02-28 DIAGNOSIS — Z515 Encounter for palliative care: Secondary | ICD-10-CM | POA: Diagnosis not present

## 2021-02-28 LAB — BASIC METABOLIC PANEL
Anion gap: 8 (ref 5–15)
BUN: 31 mg/dL — ABNORMAL HIGH (ref 8–23)
CO2: 27 mmol/L (ref 22–32)
Calcium: 8.6 mg/dL — ABNORMAL LOW (ref 8.9–10.3)
Chloride: 108 mmol/L (ref 98–111)
Creatinine, Ser: 0.53 mg/dL — ABNORMAL LOW (ref 0.61–1.24)
GFR, Estimated: >60 mL/min (ref >60)
Glucose, Bld: 119 mg/dL — ABNORMAL HIGH (ref 70–99)
Potassium: 3.9 mmol/L (ref 3.5–5.1)
Sodium: 143 mmol/L (ref 135–145)

## 2021-02-28 LAB — CBC
HCT: 26.2 % — ABNORMAL LOW (ref 39.0–52.0)
Hemoglobin: 8.5 g/dL — ABNORMAL LOW (ref 13.0–17.0)
MCH: 31.8 pg (ref 26.0–34.0)
MCHC: 32.4 g/dL (ref 30.0–36.0)
MCV: 98.1 fL (ref 80.0–100.0)
Platelets: 82 10*3/uL — ABNORMAL LOW (ref 150–400)
RBC: 2.67 MIL/uL — ABNORMAL LOW (ref 4.22–5.81)
RDW: 19.8 % — ABNORMAL HIGH (ref 11.5–15.5)
WBC: 14.1 10*3/uL — ABNORMAL HIGH (ref 4.0–10.5)
nRBC: 0 % (ref 0.0–0.2)

## 2021-02-28 LAB — APTT
aPTT: 112 seconds — ABNORMAL HIGH (ref 24–36)
aPTT: 84 seconds — ABNORMAL HIGH (ref 24–36)

## 2021-02-28 LAB — VITAMIN B12: Vitamin B-12: 302 pg/mL (ref 180–914)

## 2021-02-28 LAB — HEPARIN LEVEL (UNFRACTIONATED)
Heparin Unfractionated: 0.82 IU/mL — ABNORMAL HIGH (ref 0.30–0.70)
Heparin Unfractionated: 0.68 IU/mL (ref 0.30–0.70)
Heparin Unfractionated: 0.56 IU/mL (ref 0.30–0.70)

## 2021-02-28 LAB — MAGNESIUM: Magnesium: 2.2 mg/dL (ref 1.7–2.4)

## 2021-02-28 LAB — FOLATE: Folate: 10.1 ng/mL (ref >5.9)

## 2021-02-28 MED ORDER — HYDROMORPHONE HCL 1 MG/ML IJ SOLN
1.0000 mg | INTRAMUSCULAR | Status: DC | PRN
  Administered 2021-02-28 (×2): 1 mg via INTRAVENOUS
  Filled 2021-02-28 (×2): qty 1

## 2021-02-28 MED ORDER — HYDROMORPHONE 1 MG/ML IV SOLN
INTRAVENOUS | Status: DC
  Administered 2021-02-28: 0.5 mg via INTRAVENOUS
  Administered 2021-03-01: 5.19 mg via INTRAVENOUS
  Administered 2021-03-01: 6.7 mg via INTRAVENOUS
  Filled 2021-02-28 (×7): qty 30

## 2021-02-28 MED ORDER — ONDANSETRON HCL 4 MG/2ML IJ SOLN: 4.0000 mg | Freq: Four times a day (QID) | INTRAMUSCULAR | Status: DC | PRN

## 2021-02-28 MED ORDER — GADOBUTROL 1 MMOL/ML IV SOLN
7.5000 mL | Freq: Once | INTRAVENOUS | Status: AC | PRN
  Administered 2021-02-28: 7.5 mL via INTRAVENOUS

## 2021-02-28 MED ORDER — NALOXONE HCL 0.4 MG/ML IJ SOLN: 0.4000 mg | INTRAMUSCULAR | Status: DC | PRN

## 2021-02-28 MED ORDER — DIPHENHYDRAMINE HCL 12.5 MG/5ML PO ELIX
12.5000 mg | ORAL_SOLUTION | Freq: Four times a day (QID) | ORAL | Status: DC | PRN
  Administered 2021-03-01: 12.5 mg via ORAL
  Filled 2021-02-28: qty 5

## 2021-02-28 MED ORDER — DIPHENHYDRAMINE HCL 50 MG/ML IJ SOLN: 12.5000 mg | Freq: Four times a day (QID) | INTRAMUSCULAR | Status: DC | PRN

## 2021-02-28 MED ORDER — SODIUM CHLORIDE 0.9% FLUSH: 9.0000 mL | INTRAVENOUS | Status: DC | PRN

## 2021-02-28 NOTE — Consult Note (Signed)
Consultation Note Date: 02/28/2021   Patient Name: Brian Benton  DOB: 1956-01-30  MRN: [id number]  Age / Sex: 65 y.o., male  PCP: [place], [place] Referring Physician: Enzo Bi, MD  Reason for Consultation: Establishing goals of care and Pain control  HPI/Patient Profile: 65 y.o. male admitted on 02/25/2021    Clinical Assessment and Goals of Care: 65 year old gentleman with metastatic bladder cancer status post TURBT, also with history of hypertension dyslipidemia iron deficiency anemia thrombocytopenia tobacco use.  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  Imaging with lower extremity Doppler as well as CT abdomen and pelvis with contrast showed persistent complete occlusion of indwelling left iliofemoral venous stent with interval increase of occlusive inferior vena cava thrombus.  Also found to have circumferential bladder wall thickening.  Medical oncology as well as interventional radiology have been consulted and are following.  Patient has required PRBC for anemia.  Palliative medicine consultation for pain management and goals of care discussions has been requested. Patient is awake alert resting in bed.  He has just received IV Dilaudid for pain.  Wife present at bedside.  I introduced myself and palliative care as follows: Palliative medicine is specialized medical care for people living with serious illness. It focuses on providing relief from the symptoms and stress of a serious illness. The goal is to improve quality of life for both the patient and the family. Goals of care: Broad aims of medical therapy in relation to the patient's values and preferences. Our aim is to provide medical care aimed at enabling patients to achieve the goals that matter most to them, given the circumstances of their particular medical situation and  their constraints.   Chart reviewed and discussed with patient and wife who is present at the bedside.  At this time suspicion is that most of the clot burden is due to tumor and as such mechanical thrombectomy procedure might not ultimately be very helpful.  Patient has ongoing escalating pain management needs.  Performing an MRI of his lumbar spine and left hip is being considered for further clarification of a treatment plan.  Goals wishes and values attempted to be explored.  Discussed about the serious nature of patient's illness.  Gave some information about hospice and palliative care.  Discussed about a focused approach with only symptom management, comfort care and hospice.  Patient and wife would like to pursue MRI and find out if he would qualify for any more treatment options with regards to his cancer.  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  They wish to continue with full code/full scope mode of care.  They are asking about why the patient has not been transferred to [facility] for MRI.  While we were having these conversations, nursing staff entered the room and we have been informed that consideration is being given to obtaining MRI over here at [facility] rather  than transferring to [facility].  Patient does not want to go to [facility] and wait for MRI under general anesthesia if it is going to delay diagnosis and potential treatment plan.  He states that he takes extra doses of oral opioids for radiation and is asking if he can have 1 or 2 extra doses of IV Dilaudid prior to his MRI only for incident/breakthrough pain while he is having this procedure.  Medication history reviewed.  Pain and on pain symptom management options addressed.  Offered active listening and supportive care.  NEXT OF KIN Lives at home with his wife she is present at the bedside.  SUMMARY OF RECOMMENDATIONS   Full code/full scope care Patient and her wife asking  about MRI and how soon it can be done either here or at [facility] so that a definitive diagnosis can be made and further treatment options can be explored. At this particular moment in time, goals and wishes are not for a comfort- focused, symptom management only plan of care. I discussed with the patient about pain management options.  He is on IV Dilaudid on an as-needed basis as well as Duragesic patch.  He has adjuvants such as Cymbalta and Lyrica also available.  He also has Tylenol and oxycodone on an as needed basis.  Plan is to monitor patient's total opioid use and to consider Dilaudid bolus only PCA on 03-01-2021 for more effective pain management while also strongly considering an up titration of his Duragesic patch on 03-01-2021.  Patient today remains focused on pursuing MRI and states that his pain is manageable on current regimen. Thank you for the consult, palliative medicine team to follow.  Code Status/Advance Care Planning: Full code   Symptom Management:  Patient has Tylenol, has Cymbalta, Duragesic patch 100 mcg, Dilaudid IV as needed.  Additionally, he has oxycodone immediate release as well as Lyrica.  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 The following aspects  are pertinent.  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  Vaping Use   Vaping Use: Never used  Substance and Sexual Activity  Alcohol use: Not Currently    Comment: many 4 times a years a social drink   Drug use: No   Sexual activity: Not on file  Other Topics Concern   Not on file  Social History Narrative   Not on file   Social Determinants of Health   Financial Resource Strain: Not on file  Food Insecurity: Not on file  Transportation Needs: Not on file  Physical Activity: Not on file  Stress: Not on file  Social Connections: Not on file   Family History  Problem Relation Age of Onset   Cancer Mother        leukemia   Diabetes Brother    Scheduled Meds:  sodium chloride   Intravenous Once   aspirin EC  81 mg Oral Daily   Chlorhexidine Gluconate Cloth  6 each Topical Daily   DULoxetine  30 mg Oral QHS   Followed by   Derrill Memo ON 03/01/2021] DULoxetine  60 mg Oral QHS   fentaNYL  1 patch Transdermal Q48H   pantoprazole  40 mg Oral BID   polyethylene glycol  17 g Oral BID   pregabalin  50 mg Oral BID   Followed by   Derrill Memo ON 03/01/2021] pregabalin  50 mg Oral TID   sodium chloride flush  10-40 mL Intracatheter Q12H   Continuous Infusions:  sodium chloride Stopped (02/25/21 2247)   heparin 1,050 Units/hr (02/28/21 0558)   PRN Meds:.acetaminophen **OR**  acetaminophen, guaiFENesin, hydrALAZINE, HYDROmorphone (DILAUDID) injection, HYDROmorphone (DILAUDID) injection, ipratropium-albuterol, lactulose, LORazepam, metoprolol tartrate, ondansetron (ZOFRAN) IV, oxyCODONE, prochlorperazine, sodium chloride flush, sodium chloride flush, traZODone Medications Prior to Admission:  Prior to Admission medications   Medication Sig Start Date End Date Taking? Authorizing Provider  acetaminophen (TYLENOL) 500 MG tablet Take 500 mg by mouth every 6 (six) hours as needed for moderate pain.   Yes [provider]  apixaban (ELIQUIS) 5 MG TABS tablet TAKE 1 TABLET (5 MG TOTAL) BY MOUTH 2 (TWO) TIMES DAILY. 02/02/21 02/02/22 Yes Ennever, Rudell Cobb, MD  ASPIRIN LOW DOSE 81 MG EC tablet TAKE 1 TABLET BY MOUTH EVERY DAY 10/12/20  Yes Patwardhan, Manish J, MD  DULoxetine (CYMBALTA) 30 MG capsule Take 30-60 mg by mouth as directed. Take 1 capsule (30 mg) at bedtime for 7 Days Then Take 2 capsules (60 mg) at bedtime   Yes [provider]  fentaNYL (DURAGESIC) 100 MCG/HR Place 1 patch onto the skin every other day. 02/10/21  Yes Ennever, Rudell Cobb, MD  lactulose, encephalopathy, (CHRONULAC) 10 GM/15ML SOLN TAKE 30MLS BY MOUTH EVERY 4 HOURS AS NEEDED Patient taking differently: Take 30 g by mouth every 4 (four) hours as needed. 06/26/20 06/26/21 Yes Ennever, Rudell Cobb, MD  lidocaine-prilocaine (EMLA) cream Apply to affected area once Patient taking differently: Apply 1 application topically as directed. Access port 01/13/21  Yes Ennever, Rudell Cobb, MD  Multiple Vitamins-Minerals (MULTIVITAMIN WITH MINERALS) tablet Take 1 tablet by mouth every other day.   Yes [provider]  naloxone (NARCAN) nasal spray 4 mg/0.1 mL Place 1 spray into the nose once. 02/22/21  Yes [provider]  oxyCODONE (OXYCONTIN) 10 mg 12 hr tablet Take 1 tablet (10 mg total) by mouth every 4 (four) hours as needed. 01/21/21  Yes Ennever, Rudell Cobb, MD  Oxycodone HCl 10 MG TABS TAKE 1 TABLET  BY MOUTH EVERY 4 HOURS AS NEEDED Patient taking differently: Take 10-20 mg by mouth every 4 (four) hours as needed (pain). 02/22/21 08/21/21 Yes Burney Gauze  R, MD  pantoprazole (PROTONIX) 40 MG tablet Take 1 tablet (40 mg total) by mouth 2 (two) times daily. 01/13/21 01/13/22 Yes Ennever, Rudell Cobb, MD  polyethylene glycol (MIRALAX / GLYCOLAX) 17 g packet Take 17 g by mouth daily.   Yes [provider]  pregabalin (LYRICA) 50 MG capsule Take 50 mg by mouth as directed. Take 1 capsule (50 mg) at bedtime for 3 nights, Take 1 capsule (50 mg) BID for 3 Days, Then Take 1 capsule (50 mg) TID 02/22/21  Yes [provider]  sodium chloride (OCEAN) 0.65 % SOLN nasal spray Place 1 spray into both nostrils daily as needed for congestion (Allergies).   Yes [provider]  traMADol (ULTRAM) 50 MG tablet TAKE 2 TABLETS BY MOUTH EVERY 6 HOURS AS NEEDED. Patient taking differently: Take 100 mg by mouth every 6 (six) hours as needed for moderate pain. 02/08/21  Yes Ennever, Rudell Cobb, MD  dexamethasone (DECADRON) 4 MG tablet Take 2 tablets (8 mg total) by mouth daily. Start the day after chemotherapy for 2 days. Patient not taking: No sig reported 01/13/21   Volanda Napoleon, MD  dexamethasone (DECADRON) 6 MG tablet Take 3 tablets (18 mg total) by mouth daily. **You MUST take with food in the morning.** Patient not taking: No sig reported 01/13/21   Volanda Napoleon, MD  gabapentin (NEURONTIN) 600 MG tablet TAKE 1-2 TABLETS (600 MG TOTAL) BY MOUTH IN THE MORNING, AT NOON, IN THE EVENING,AND AT BEDTIME Patient not taking: No sig reported 12/23/20   Volanda Napoleon, MD  gabapentin (NEURONTIN) 600 MG tablet Take 2 tablets (1,200 mg total) by mouth in the morning, at noon, in the evening, and at bedtime. Patient not taking: No sig reported 01/05/21 01/05/22  Volanda Napoleon, MD  HYDROmorphone (DILAUDID) 8 MG tablet Take 1 tablet (8 mg total) by mouth every 6 (six) hours as needed for severe pain. Patient  not taking: No sig reported 01/13/21   Volanda Napoleon, MD  LORazepam (ATIVAN) 0.5 MG tablet Take 1 tablet (0.5 mg total) by mouth every 6 (six) hours as needed (Nausea or vomiting). Patient not taking: No sig reported 01/13/21   Volanda Napoleon, MD  LORazepam (ATIVAN) 0.5 MG tablet Take 2 tablets (1 mg total) by mouth every 6 (six) hours as needed for anxiety. Patient not taking: No sig reported 02/16/21 08/15/21  Volanda Napoleon, MD  meloxicam (MOBIC) 15 MG tablet TAKE 1 TABLET (15 MG TOTAL) BY MOUTH DAILY. Patient not taking: No sig reported 02/22/21 02/22/22  Volanda Napoleon, MD  ondansetron (ZOFRAN) 8 MG tablet Take 1 tablet (8 mg total) by mouth 2 (two) times daily as needed for refractory nausea / vomiting. Start on day 3 after chemo. 01/13/21   Volanda Napoleon, MD  ondansetron (ZOFRAN-ODT) 8 MG disintegrating tablet TAKE 1 TABLET BY MOUTH EVERY 8 HOURS AS NEEDED FOR NAUSEA & VOMITING 09/14/20 09/14/21  Volanda Napoleon, MD  orphenadrine (NORFLEX) 100 MG tablet TAKE 1 TABLET (100 MG TOTAL) BY MOUTH 2 (TWO) TIMES DAILY. Patient not taking: Reported on 02/25/2021 11/03/20   Volanda Napoleon, MD  prochlorperazine (COMPAZINE) 10 MG tablet TAKE 1 TABLET (10 MG TOTAL) BY MOUTH EVERY 6 (SIX) HOURS AS NEEDED FOR NAUSEA OR VOMITING. Patient not taking: No sig reported 09/18/20 09/18/21  Volanda Napoleon, MD  prochlorperazine (COMPAZINE) 10 MG tablet Take 1 tablet (10 mg total) by mouth every 6 (six) hours as needed (Nausea or vomiting).  Patient not taking: No sig reported 01/13/21   Volanda Napoleon, MD  temazepam (RESTORIL) 15 MG capsule Take 1 capsule (15 mg total) by mouth at bedtime as needed for sleep. Patient not taking: Reported on 02/25/2021 02/02/21   Volanda Napoleon, MD  enoxaparin (LOVENOX) 80 MG/0.8ML injection Inject 0.8 mLs (80 mg total) into the skin every 12 (twelve) hours. 05/20/20 07/29/20  Volanda Napoleon, MD   No Known Allergies Review of Systems L hip pain.   Physical Exam Currently resting,  awakens easily Left lower extremity ongoing pain and discomfort Regular work of breathing Awake alert oriented answers questions appropriately S1-S2  Vital Signs: BP 105/60 (BP Location: Left Arm)   Pulse 62   Temp 98 F (36.7 C) (Oral)   Resp 16   Ht 6' (1.829 m)   Wt 77.1 kg   SpO2 100%   BMI 23.06 kg/m  Pain Scale: 0-10   Pain Score: 4    SpO2: SpO2: 100 % O2 Device:SpO2: 100 % O2 Flow Rate: .   IO: Intake/output summary:  Intake/Output Summary (Last 24 hours) at 02/28/2021 1041 Last data filed at 02/28/2021 0300 Gross per 24 hour  Intake 1128.1 ml  Output --  Net 1128.1 ml    LBM: Last BM Date: 02/26/21 Baseline Weight: Weight: 77.1 kg Most recent weight: Weight: 77.1 kg     Palliative Assessment/Data:   PPS 60%  Time In:  9.30 Time Out:  10.30 Time Total:  60  Greater than 50%  of this time was spent counseling and coordinating care related to the above assessment and plan.  Signed by: Loistine Chance, MD   Please contact Palliative Medicine Team phone at [phone] for questions and concerns.  For individual provider: See Shea Evans

## 2021-02-28 NOTE — Progress Notes (Signed)
PMT no charge note  Call received from RN regarding the patient's uncontrolled pain. Patient's MRI results also reviewed.  Plan: Start bolus only Dilaudid PCA tonight.  Continue Fentanyl patch and other adjuvants D/C IV Dilaudid PRN once PCA is set up.  PMT to follow.  No charge Loistine Chance MD [place] palliative.

## 2021-02-28 NOTE — Progress Notes (Signed)
PROGRESS NOTE    Brian Benton  [id number] DOB: May 20, 1956 DOA: 02/25/2021 PCP: [facility]   Brief Narrative:  65 year old with history of metastatic bladder cancer status post TURBT, HLD, HTN, iron deficiency anemia, thrombocytopenia, tobacco use, left iliofemoral DVT on Eliquis comes to the hospital with worsening left hip pain for 3-4 days.  Lower extremity Doppler and CT abdomen pelvis with contrast showed persistent complete occlusion of indwelling left iliofemoral venous stent with interval increase of occlusive inferior vena cava thrombus.  Circumferential bladder wall thickening.  Interventional radiology was consulted for thrombectomy and patient is being transferred to [facility].  On the day of transfer his hemoglobin was noted to be 6.2 therefore units PRBC transfusion was ordered.  His anemia is thought to be iron deficient.  Hematology oncology is following.   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 Anselm Pancoast was concerned that they are tumor thrombus Plan: --MRI lumbar w contrast today --cont heparin gtt pending further eval with IR and oncology --cont current pain regimen as managed by Dr. Marin Olp  History of metastatic bladder cancer - management per Dr. Marin Olp --cont current pain regimen as managed by Dr. Marin Olp  Anemia of chronic disease and iron deficiency - Iron deficiency and chemotherapy related.  No obvious evidence of bleeding.  He states he had colonoscopy about 2 years ago and did not show any  significant abnormal findings.   --Hemoglobin dropped to 6.7 on 9/17 and received 2 units of PRBC.   Plan: --monitor Hgb and transfuse if Hgb <7  Thrombocytopenia  Constipation - scheduled Miralax BID  Leukocytosis - No evidence of infection.  UA is negative.  Chest x-ray does not show any active disease.  Procalcitonin 0.5.  Suspect his leukocytosis is reactive. --hold abx  Essential hypertension, not currently active -Does not appear to be on home meds.   Mild protein calorie malnutrition -Encourage oral intake  Tobacco use -Nicotine patch as needed  Superficial pressure ulcer on his right buttock area.  Advised nursing staff to put pressure dressing on it for cushioning.    DVT prophylaxis: Continue heparin drip Code Status: Full Family Communication:  updated wife at bedside today    Dispo: The patient is from: Home              Anticipated d/c is to: Home              Patient currently is not medically stable to d/c.  On heparin gtt    Difficult to place patient No   Subjective: Pain is controlled on current regimen.  Normal oral intake, urination and BM.  Examination:  Constitutional: NAD, AAOx3 HEENT: conjunctivae and lids normal, EOMI CV: No cyanosis.   RESP: normal respiratory effort, on RA Extremities: No effusions, edema in BLE SKIN: warm, dry Neuro: II - XII grossly intact.   Psych: Normal mood and affect.  Appropriate judgement and reason   Objective: Vitals:   02/27/21 2013 02/27/21 2015 02/28/21 0518 02/28/21 1318  BP: 116/62 116/62 105/60  111/76  Pulse: 65 65 62 71  Resp: (!) 24 (!) '24 16 20  '$ Temp: 98.8 F (37.1 C) 98.8 F (37.1 C) 98 F (36.7 C) 99 F (37.2 C)  TempSrc: Oral Oral Oral Oral  SpO2: 97% 97% 100% 95%  Weight:      Height:        Intake/Output Summary (Last 24 hours) at 02/28/2021 1431 Last data filed at 02/28/2021 0749 Gross per 24 hour  Intake 848.1 ml  Output --  Net 848.1 ml   Filed Weights   02/25/21 1416  02/25/21 1557  Weight: 77.1 kg 77.1 kg     Data Reviewed:   CBC: Recent Labs  Lab 02/24/21 1047 02/25/21 1639 02/26/21 0515 02/27/21 0222 02/28/21 0437  WBC 20.8* 16.7* 17.6* 14.1* 14.1*  NEUTROABS 18.7* 15.5*  --   --   --   HGB 10.1* 9.8* 8.8* 6.7* 8.5*  HCT 30.8* 30.6* 27.0* 20.5* 26.2*  MCV 96.0 99.0 97.5 96.7 98.1  PLT 73* 48* 49* 65* 82*   Basic Metabolic Panel: Recent Labs  Lab 02/24/21 1047 02/25/21 1639 02/26/21 0515 02/27/21 0222 02/28/21 0437  NA 133* 139 134* 138 143  K 4.3 4.0 3.4* 3.8 3.9  CL 99 107 99 104 108  CO2 '28 26 22 26 27  '$ GLUCOSE 101* 99 130* 197* 119*  BUN 40* 33* 30* 30* 31*  CREATININE 1.01 0.98 0.86 0.75 0.53*  CALCIUM 9.4 9.1 8.6* 8.4* 8.6*  MG  --   --   --  1.9 2.2   GFR: Estimated Creatinine Clearance: 99.1 mL/min (A) (by C-G formula based on SCr of 0.53 mg/dL (L)). Liver Function Tests: Recent Labs  Lab 02/24/21 1047 02/25/21 1639  AST 12* 19  ALT 19 22  ALKPHOS 57 68  BILITOT 0.7 1.2  PROT 5.7* 6.2*  ALBUMIN 3.7 3.4*   No results for input(s): LIPASE, AMYLASE in the last 168 hours. No results for input(s): AMMONIA in the last 168 hours. Coagulation Profile: Recent Labs  Lab 02/25/21 2255  INR 1.2   Cardiac Enzymes: No results for input(s): CKTOTAL, CKMB, CKMBINDEX, TROPONINI in the last 168 hours. BNP (last 3 results) No results for input(s): PROBNP in the last 8760 hours. HbA1C: No results for input(s): HGBA1C in the last 72 hours. CBG: No results for input(s): GLUCAP in the last 168 hours. Lipid Profile: No results for input(s): CHOL, HDL, LDLCALC, TRIG, CHOLHDL, LDLDIRECT in the last 72 hours. Thyroid Function Tests: No results for input(s): TSH, T4TOTAL, FREET4, T3FREE, THYROIDAB in the last 72 hours. Anemia Panel: Recent Labs    02/27/21 0222  FERRITIN 416*  TIBC 211*  IRON 23*  RETICCTPCT 3.0   Sepsis Labs: Recent Labs  Lab 02/26/21 0515  PROCALCITON 0.54    No results found for this or  any previous visit (from the past 240 hour(s)).       Radiology Studies: MR Lumbar Spine W Wo Contrast  Result Date: 02/28/2021 CLINICAL DATA:  Urologic cancer, assess treatment response On treatment. Assess for progressive disease. Metastatic bladder cancer. EXAM: MRI LUMBAR SPINE WITHOUT AND WITH CONTRAST TECHNIQUE: Multiplanar and multiecho pulse sequences of the lumbar spine were obtained without and with intravenous contrast. CONTRAST:  7.22m GADAVIST GADOBUTROL 1 MMOL/ML IV SOLN COMPARISON:  Lumbar spine CT 02/25/2021. Lumbar spine MRI 01/04/2021. FINDINGS: Multiple sequences are severely motion degraded including all axial sequences (particularly severe on the axial postcontrast T1 sequence). Segmentation: Standard. Alignment:  Trace retrolisthesis of L2 on L3.  Vertebrae: No acute lumbar spine fracture. Known tumor eroding the left sacral ala which has likely mildly progressed and mildly involves the left lateral aspect of the L5 vertebral body. Conus medullaris and cauda equina: Conus extends to the L1-2 level. Conus and cauda equina appear normal. Paraspinal and other soft tissues: Known large soft tissue mass in the retroperitoneum extending along the left iliopsoas musculature with sacral ala and left L5 vertebral body involvement as noted above. Tumor extends into the left L5-S1 neural foramen. Complete assessment of tumor is limited by incomplete coverage on this lumbar MRI and motion artifact, better evaluated on the recent CT. Artifact from left iliac venous stent. Chronic bladder wall thickening. Bilateral renal cysts. Disc levels: Mild disc bulging at L2-3, L3-4, and L4-5. Moderate multilevel facet hypertrophy. Mild bilateral neural foraminal stenosis at L4-5. No significant spinal stenosis. IMPRESSION: 1. Severely motion degraded examination. 2. Known left-sided retroperitoneal tumor involving the left sacral ala, L5 vertebral body, and left L5-S1 neural foramen, mildly progressed from  12/2020. Electronically Signed   By: Logan Bores M.D.   On: 02/28/2021 14:07        Scheduled Meds:  sodium chloride   Intravenous Once   aspirin EC  81 mg Oral Daily   Chlorhexidine Gluconate Cloth  6 each Topical Daily   DULoxetine  30 mg Oral QHS   Followed by   Derrill Memo ON 03/01/2021] DULoxetine  60 mg Oral QHS   fentaNYL  1 patch Transdermal Q48H   pantoprazole  40 mg Oral BID   polyethylene glycol  17 g Oral BID   pregabalin  50 mg Oral BID   Followed by   Derrill Memo ON 03/01/2021] pregabalin  50 mg Oral TID   sodium chloride flush  10-40 mL Intracatheter Q12H   Continuous Infusions:  sodium chloride Stopped (02/25/21 2247)   heparin 1,050 Units/hr (02/28/21 0558)     LOS: 2 days    Enzo Bi, MD [facility]  If 7PM-7AM, please contact night-coverage  02/28/2021, 2:31 PM

## 2021-02-28 NOTE — Progress Notes (Signed)
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  No Known Allergies  Patient Measurements: Height: 6' (182.9 cm) Weight: 77.1 kg (170 lb) IBW/kg (Calculated) : 77.6 Heparin Dosing Weight: 77.1 kg  Vital Signs: Temp: 99 F (37.2 C) (09/18 1318) Temp Source: Oral (09/18 1318) BP: 111/76 (09/18 1318) Pulse Rate: 71 (09/18 1318)  Labs: Recent Labs    02/25/21 2255 02/26/21 0515 02/26/21 0656 02/27/21 0222 02/27/21 0841 02/28/21 0437 02/28/21 1419  HGB  --  8.8*  --  6.7*  --  8.5*  --   HCT  --  27.0*  --  20.5*  --  26.2*  --   PLT  --  49*  --  65*  --  82*  --   APTT 40*  --    < > 98* 107* 112* 84*  LABPROT 15.2  --   --   --   --   --   --   INR 1.2  --   --   --   --   --   --   HEPARINUNFRC >1.10*  --    < > 0.77*  --  0.82* 0.68  CREATININE  --  0.86  --  0.75  --  0.53*  --    < > = values in this interval not displayed.    Estimated Creatinine Clearance: 99.1 mL/min (A) (by C-G formula based on SCr of 0.53 mg/dL (L)).  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  Pltc low at 48K.  Orders received to begin IV heparin (no loading/bolus dose).   Today, 02/28/2021: - aPTT is therapeutic at 84 secs - Heparin level is therapeutic 0.68 on heparin drip at 1050 units/hr - hgb up 8.5 (s/p 2 units PRBC), plts low but trending up - no bleeding documented - IR suspects clot is tumor thrombus. No invasive  intervention at this time  Goal of Therapy:  aPTT 66 -102 seconds  Heparin level 0.3-0.7 units/mL Monitor platelets by anticoagulation protocol: Yes   Plan:  - Continue IV heparin infusion at 1050 units/hr - Check confirmatory heparin level in 6hrs, no need to check aPTT any longer since they are now correlating with heparin levels - Daily CBC, heparin level - Monitor closely for s/x of bleeding   Peggyann Juba, PharmD, BCPS Pharmacy: [phone] 02/28/2021 4:08 PM

## 2021-02-28 NOTE — Progress Notes (Signed)
[place] for Heparin Indication: L iliofemoral DVT with occlusion of IVC  No Known Allergies  Patient Measurements: Height: 6' (182.9 cm) Weight: 77.1 kg (170 lb) IBW/kg (Calculated) : 77.6 Heparin Dosing Weight: 77.1 kg  Vital Signs: Temp: 98 F (36.7 C) (09/18 0518) Temp Source: Oral (09/18 0518) BP: 105/60 (09/18 0518) Pulse Rate: 62 (09/18 0518)  Labs: Recent Labs    02/25/21 1639 02/25/21 2255 02/26/21 0515 02/26/21 0656 02/26/21 1519 02/26/21 1820 02/27/21 0222 02/27/21 0841 02/28/21 0437  HGB 9.8*  --  8.8*  --   --   --  6.7*  --   --   HCT 30.6*  --  27.0*  --   --   --  20.5*  --   --   PLT 48*  --  49*  --   --   --  65*  --   --   APTT  --  40*  --    < > 162*   < > 98* 107* 112*  LABPROT  --  15.2  --   --   --   --   --   --   --   INR  --  1.2  --   --   --   --   --   --   --   HEPARINUNFRC  --  >1.10*  --    < > 1.03*  --  0.77*  --  0.82*  CREATININE 0.98  --  0.86  --   --   --  0.75  --   --    < > = values in this interval not displayed.    Estimated Creatinine Clearance: 99.1 mL/min (by C-G formula based on SCr of 0.75 mg/dL).  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  Pltc low at 48K.  Orders received to begin IV heparin (no loading/bolus dose). Note that recent DOAC therapy can falsely elevate heparin levels, so will need to use aPTT for heparin rate titration until aPTT correlates with heparin  level.  Today, 02/28/2021: - aPTT is SUPRAtherapeutic at 112 secs on IV heparin 1200 units/hr - Heparin level 0.82- also elevated & actually increased from yesterday - hgb improved to 8.5 after 2 units PRBC on 9/17 - no bleeding reported by RN - RN confirmed heparin rate and that labs obtained peripherally from opposite am as heparin infusion - IR suspects clot is tumor thrombus. No invasive intervention at this time   Goal of Therapy:  aPTT 66 -102 seconds  Heparin level 0.3-0.7 units/mL Monitor platelets by anticoagulation protocol: Yes   Plan:  - Decrease IV heparin infusion to 1050 units/hr - Recheck aPTT in 6h - Daily CBC, aPTT, heparin level - Monitor closely for s/x of bleeding  Netta Cedars, PharmD, BCPS 02/28/2021 5:45 AM

## 2021-02-28 NOTE — Progress Notes (Signed)
[place] for Heparin Indication: L iliofemoral DVT with occlusion of IVC  No Known Allergies  Patient Measurements: Height: 6' (182.9 cm) Weight: 77.1 kg (170 lb) IBW/kg (Calculated) : 77.6 Heparin Dosing Weight: 77.1 kg  Vital Signs: Temp: 98.4 F (36.9 C) (09/18 2045) Temp Source: Oral (09/18 2045) BP: 115/62 (09/18 2045) Pulse Rate: 73 (09/18 2045)  Labs: Recent Labs     0000 02/25/21 2255 02/26/21 0515 02/26/21 [id number] 02/27/21 0222 02/27/21 0841 02/28/21 0437 02/28/21 1419 02/28/21 2016  HGB   < >  --  8.8*  --  6.7*  --  8.5*  --   --   HCT  --   --  27.0*  --  20.5*  --  26.2*  --   --   PLT  --   --  49*  --  65*  --  82*  --   --   APTT  --  40*  --    < > 98* 107* 112* 84*  --   LABPROT  --  15.2  --   --   --   --   --   --   --   INR  --  1.2  --   --   --   --   --   --   --   HEPARINUNFRC  --  >1.10*  --    < > 0.77*  --  0.82* 0.68 0.56  CREATININE  --   --  0.86  --  0.75  --  0.53*  --   --    < > = values in this interval not displayed.    Estimated Creatinine Clearance: 99.1 mL/min (A) (by C-G formula based on SCr of 0.53 mg/dL (L)).  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  Pltc low at 48K.  Orders received to begin IV heparin (no loading/bolus dose).   Today, 02/28/2021 - Heparin level @ 2016 is therapeutic 0.56 on heparin drip at 1050 units/hr - no bleeding or infusion related issues reported by RN - IR  suspects clot is tumor thrombus. No invasive intervention at this time  Goals: Heparin level 0.3-0.7 units/mL Monitor platelets by anticoagulation protocol: Yes   Plan:  - Continue IV heparin infusion at 1050 units/hr - Daily CBC, heparin level - Monitor closely for s/x of bleeding   Netta Cedars, PharmD, BCPS Pharmacy: [phone] 02/28/2021 10:20 PM

## 2021-03-01 ENCOUNTER — Encounter: Payer: Self-pay | Admitting: Hematology & Oncology

## 2021-03-01 DIAGNOSIS — Z7189 Other specified counseling: Secondary | ICD-10-CM | POA: Diagnosis not present

## 2021-03-01 DIAGNOSIS — C799 Secondary malignant neoplasm of unspecified site: Secondary | ICD-10-CM

## 2021-03-01 DIAGNOSIS — Z515 Encounter for palliative care: Secondary | ICD-10-CM

## 2021-03-01 DIAGNOSIS — I82422 Acute embolism and thrombosis of left iliac vein: Secondary | ICD-10-CM | POA: Diagnosis not present

## 2021-03-01 DIAGNOSIS — C679 Malignant neoplasm of bladder, unspecified: Secondary | ICD-10-CM

## 2021-03-01 LAB — BASIC METABOLIC PANEL
Anion gap: 7 (ref 5–15)
BUN: 31 mg/dL — ABNORMAL HIGH (ref 8–23)
CO2: 26 mmol/L (ref 22–32)
Calcium: 8.5 mg/dL — ABNORMAL LOW (ref 8.9–10.3)
Chloride: 108 mmol/L (ref 98–111)
Creatinine, Ser: 0.98 mg/dL (ref 0.61–1.24)
GFR, Estimated: >60 mL/min (ref >60)
Glucose, Bld: 93 mg/dL (ref 70–99)
Potassium: 4.2 mmol/L (ref 3.5–5.1)
Sodium: 141 mmol/L (ref 135–145)

## 2021-03-01 LAB — TYPE AND SCREEN
ABO/RH(D): A POS
Antibody Screen: NEGATIVE
Unit division: 0
Unit division: 0

## 2021-03-01 LAB — BPAM RBC
Blood Product Expiration Date: 202210132359
Blood Product Expiration Date: 202210132359
ISSUE DATE / TIME: 202209170836
ISSUE DATE / TIME: 202209171222
Unit Type and Rh: 6200
Unit Type and Rh: 6200

## 2021-03-01 LAB — CBC
HCT: 28.3 % — ABNORMAL LOW (ref 39.0–52.0)
Hemoglobin: 9.1 g/dL — ABNORMAL LOW (ref 13.0–17.0)
MCH: 32.3 pg (ref 26.0–34.0)
MCHC: 32.2 g/dL (ref 30.0–36.0)
MCV: 100.4 fL — ABNORMAL HIGH (ref 80.0–100.0)
Platelets: 89 10*3/uL — ABNORMAL LOW (ref 150–400)
RBC: 2.82 MIL/uL — ABNORMAL LOW (ref 4.22–5.81)
RDW: 19.9 % — ABNORMAL HIGH (ref 11.5–15.5)
WBC: 13.6 10*3/uL — ABNORMAL HIGH (ref 4.0–10.5)
nRBC: 0 % (ref 0.0–0.2)

## 2021-03-01 LAB — HEPARIN LEVEL (UNFRACTIONATED): Heparin Unfractionated: 0.54 IU/mL (ref 0.30–0.70)

## 2021-03-01 LAB — MAGNESIUM: Magnesium: 1.9 mg/dL (ref 1.7–2.4)

## 2021-03-01 MED ORDER — SODIUM CHLORIDE 0.9 % IV BOLUS
500.0000 mL | Freq: Once | INTRAVENOUS | Status: AC
  Administered 2021-03-01: 500 mL via INTRAVENOUS

## 2021-03-01 MED ORDER — PREGABALIN 50 MG PO CAPS
50.0000 mg | ORAL_CAPSULE | Freq: Three times a day (TID) | ORAL | Status: DC
  Administered 2021-03-01 – 2021-03-08 (×20): 50 mg via ORAL
  Filled 2021-03-01 (×22): qty 1

## 2021-03-01 MED ORDER — SODIUM CHLORIDE 0.9 % IV SOLN: INTRAVENOUS | Status: AC

## 2021-03-01 MED ORDER — HYDROMORPHONE 1 MG/ML IV SOLN
INTRAVENOUS | Status: DC
  Administered 2021-03-01: 4.22 mL via INTRAVENOUS
  Administered 2021-03-02: 1.97 mg via INTRAVENOUS
  Administered 2021-03-02: 2.61 mg via INTRAVENOUS
  Administered 2021-03-02: 4.65 mg via INTRAVENOUS
  Administered 2021-03-02: 3.77 mg via INTRAVENOUS
  Filled 2021-03-01: qty 30

## 2021-03-01 MED ORDER — FONDAPARINUX SODIUM 7.5 MG/0.6ML ~~LOC~~ SOLN
7.5000 mg | SUBCUTANEOUS | Status: DC
  Administered 2021-03-01 – 2021-03-02 (×2): 7.5 mg via SUBCUTANEOUS
  Filled 2021-03-01 (×2): qty 0.6

## 2021-03-01 NOTE — Progress Notes (Signed)
Unfortunately, looks like we do have progressive disease.  I am surprised that he had the MRI over the weekend.  I thought he was going to have to have the MRI under general anesthesia.  The MRI does show that there is some tumor progression.  The concern is that he does have tumor thrombus.  I had a long talk with he and his wife this morning.  I just I have no other treatments that we can offer him.  I know he has been seen at [facility].  I do think that they would have anything to offer him.  He is now on a PCA Dilaudid for the pain.  I wonder if there is any way we could consider doing some kind of nerve block for him.  I do not know if there is a pain specialist who can do that in the hospital.  I think that we are going to have to begin to "switch gears" and think about quality of life and comfort.  I explained this to he and his wife.  I think that Hospice would be a great idea for them.  I am not sure how receptive they are to this.  I think that his daughter actually does Hospice down in [place].  I think she is coming up to be with him.  I will see if we can talk to her about this.  He is still on the heparin infusion.  I think we will get have to switch him over to something subcutaneous.  I would probably consider Arixtra for him.  This is daily.  I think this would make sense.  He was transfused over the weekend.  His hemoglobin today is 9.1.  His platelet count is 89,000.  White cell count 13.6.  His BUN 31 creatinine 0.98.  I am not sure how much she is really eating.  He is having no problems with nausea or vomiting.  I am not sure if he is going to the bathroom.  Again this is a tough situation.  I know he is done everything we have asked him to do.  I think we just are at a point that his tumor is becoming refractory.  I have not discussed CODE STATUS with him yet.  I think this might be a little bit too much for them to handle in 1 conversation.  They are thinking about hiring  some kind of home care nurse.  I wonder if we may have to think about a CADD pump for pain control.  This might be a reasonable way of trying to help him so he can be at home and be somewhat ambulatory.  I really cannot think of any other study that we have to do on him right now.  Again I think we have to focus on quality of life now.  Comfort is a priority.  Lattie Haw, MD  2 Timothy 4:6-8

## 2021-03-01 NOTE — Progress Notes (Signed)
PROGRESS NOTE    Brian Benton  [id number] DOB: 02/07/1956 DOA: 02/25/2021 PCP: [facility]   Brief Narrative:  65 year old with history of metastatic bladder cancer status post TURBT, HLD, HTN, iron deficiency anemia, thrombocytopenia, tobacco use, left iliofemoral DVT on Eliquis comes to the hospital with worsening left hip pain for 3-4 days.  Lower extremity Doppler and CT abdomen pelvis with contrast showed persistent complete occlusion of indwelling left iliofemoral venous stent with interval increase of occlusive inferior vena cava thrombus.  Circumferential bladder wall thickening.  Interventional radiology was consulted for thrombectomy and patient is being transferred to [facility].  On the day of transfer his hemoglobin was noted to be 6.2 therefore units PRBC transfusion was ordered.  His anemia is thought to be iron deficient.  Hematology oncology is following.   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 Anselm Benton was concerned that they are tumor thrombus --MRI lumbar w contrast showed some tumor progression. --started on PCA on 9/18 by palliative provider. Plan: --switch to Arixtra by oncology --cont PCA as managed by palliative care provider  History of metastatic bladder cancer - management per Dr. Marin Benton --palliative consult and goals of care discussion  Anemia of chronic disease and iron deficiency - Iron deficiency and chemotherapy related.   No obvious evidence of bleeding.  He states he had colonoscopy about 2 years ago and did not show any significant abnormal findings.   --Hemoglobin dropped to 6.7 on 9/17 and received 2 units of PRBC.   Plan: --Monitor Hgb and transfuse if Hgb <7  Thrombocytopenia  Constipation - scheduled Miralax BID  Leukocytosis - No evidence of infection.  UA is negative.  Chest x-ray does not show any active disease.  Procalcitonin 0.5.  Suspect his leukocytosis is reactive. --hold abx  Essential hypertension, not currently active -Does not appear to be on home meds.   Reduced urine output  --2/2 poor oral hydration.  Bladder scan found no retention --NS 500 ml f/b 100 ml/hr for 10 hours --encourage oral hydration  Mild protein calorie malnutrition -Encourage oral intake  Tobacco use -Nicotine patch as needed  Superficial pressure ulcer on his right buttock area.  Advised nursing staff to put pressure dressing on it for cushioning.    DVT prophylaxis: on Arixtra  Code Status: Full Family Communication:  updated daughter at bedside today    Dispo: The patient is from: Home              Anticipated d/c is to: Home              Patient currently is not medically stable to d/c.  On PCA    Difficult to place patient No   Subjective: Pt reported uncontrolled pain last night and was started on PCA pump by palliative provider.  This morning, pt reported pain controlled.  RN reported very little urine output.  Family reported very little oral intake due to pt being lethargic  on pain meds.  Examination:  Constitutional: NAD, AAOx3 HEENT: conjunctivae and lids normal, EOMI CV: No cyanosis.   RESP: normal respiratory effort, on RA Extremities: No effusions, edema in BLE SKIN: warm, dry Neuro: II - XII grossly intact.     Objective: Vitals:   03/01/21 1200 03/01/21 1548 03/01/21 2015 03/01/21 2049  BP:   138/74   Pulse:   (!) 109   Resp: '14 16 20 '$ (!) 24  Temp:   98.8 F (37.1  C)   TempSrc:   Oral   SpO2: 97% 96% 93% 95%  Weight:      Height:        Intake/Output Summary (Last 24 hours) at 03/01/2021 2102 Last data filed at 03/01/2021 1700 Gross per 24 hour  Intake 626.56 ml  Output 250 ml  Net 376.56 ml   Filed Weights   02/25/21 1416 02/25/21 1557  Weight: 77.1 kg 77.1 kg     Data Reviewed:   CBC: Recent Labs  Lab 02/24/21 1047 02/25/21 1639 02/26/21 0515 02/27/21 0222 02/28/21 0437 03/01/21 0434  WBC 20.8* 16.7* 17.6* 14.1* 14.1* 13.6*  NEUTROABS 18.7* 15.5*  --   --   --   --   HGB 10.1* 9.8* 8.8* 6.7* 8.5* 9.1*  HCT 30.8* 30.6* 27.0* 20.5* 26.2* 28.3*  MCV 96.0 99.0 97.5 96.7 98.1 100.4*  PLT 73* 48* 49* 65* 82* 89*   Basic Metabolic Panel: Recent Labs  Lab 02/25/21 1639 02/26/21 0515 02/27/21 0222 02/28/21 0437 03/01/21 0434  NA 139 134* 138 143 141  K 4.0 3.4* 3.8 3.9 4.2  CL 107 99 104 108 108  CO2 '26 22 26 27 26  '$ GLUCOSE 99 130* 197* 119* 93  BUN 33* 30* 30* 31* 31*  CREATININE 0.98 0.86 0.75 0.53* 0.98  CALCIUM 9.1 8.6* 8.4* 8.6* 8.5*  MG  --   --  1.9 2.2 1.9   GFR: Estimated Creatinine Clearance: 80.9 mL/min (by C-G formula based on SCr of 0.98 mg/dL). Liver Function Tests: Recent Labs  Lab 02/24/21 1047 02/25/21 1639  AST 12* 19  ALT 19 22  ALKPHOS 57 68  BILITOT 0.7 1.2  PROT 5.7* 6.2*  ALBUMIN 3.7 3.4*   No results for input(s): LIPASE, AMYLASE in the last 168 hours. No results for input(s): AMMONIA in the last 168 hours. Coagulation Profile: Recent Labs  Lab 02/25/21 2255  INR 1.2   Cardiac Enzymes: No results for input(s): CKTOTAL, CKMB, CKMBINDEX, TROPONINI in the last 168 hours. BNP (last 3 results) No results for input(s): PROBNP in the last 8760 hours. HbA1C: No results for input(s): HGBA1C in the last 72 hours. CBG: No results for input(s): GLUCAP in the last 168 hours. Lipid Profile: No results for input(s): CHOL, HDL, LDLCALC, TRIG, CHOLHDL, LDLDIRECT in the last 72  hours. Thyroid Function Tests: No results for input(s): TSH, T4TOTAL, FREET4, T3FREE, THYROIDAB in the last 72 hours. Anemia Panel: Recent Labs    02/27/21 0222 02/28/21 1419  VITAMINB12  --  302  FOLATE  --  10.1  FERRITIN 416*  --   TIBC 211*  --   IRON 23*  --   RETICCTPCT 3.0  --    Sepsis Labs: Recent Labs  Lab 02/26/21 0515  PROCALCITON 0.54    No results found for this or any previous visit (from the past 240 hour(s)).       Radiology Studies: MR Lumbar Spine W Wo Contrast  Result Date: 02/28/2021 CLINICAL DATA:  Urologic  cancer, assess treatment response On treatment. Assess for progressive disease. Metastatic bladder cancer. EXAM: MRI LUMBAR SPINE WITHOUT AND WITH CONTRAST TECHNIQUE: Multiplanar and multiecho pulse sequences of the lumbar spine were obtained without and with intravenous contrast. CONTRAST:  7.50m GADAVIST GADOBUTROL 1 MMOL/ML IV SOLN COMPARISON:  Lumbar spine CT 02/25/2021. Lumbar spine MRI 01/04/2021. FINDINGS: Multiple sequences are severely motion degraded including all axial sequences (particularly severe on the axial postcontrast T1 sequence). Segmentation: Standard. Alignment:  Trace retrolisthesis of L2 on L3. Vertebrae: No acute lumbar spine fracture. Known tumor eroding the left sacral ala which has likely mildly progressed and mildly involves the left lateral aspect of the L5 vertebral body. Conus medullaris and cauda equina: Conus extends to the L1-2 level. Conus and cauda equina appear normal. Paraspinal and other soft tissues: Known large soft tissue mass in the retroperitoneum extending along the left iliopsoas musculature with sacral ala and left L5 vertebral body involvement as noted above. Tumor extends into the left L5-S1 neural foramen. Complete assessment of tumor is limited by incomplete coverage on this lumbar MRI and motion artifact, better evaluated on the recent CT. Artifact from left iliac venous stent. Chronic bladder wall  thickening. Bilateral renal cysts. Disc levels: Mild disc bulging at L2-3, L3-4, and L4-5. Moderate multilevel facet hypertrophy. Mild bilateral neural foraminal stenosis at L4-5. No significant spinal stenosis. IMPRESSION: 1. Severely motion degraded examination. 2. 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 Electronically Signed   By: ALogan BoresM.D.   On: 02/28/2021 14:07        Scheduled Meds:  sodium chloride   Intravenous Once   aspirin EC  81 mg Oral Daily   Chlorhexidine Gluconate Cloth  6 each Topical Daily   DULoxetine  60 mg Oral QHS   fentaNYL  1 patch Transdermal Q48H   fondaparinux (ARIXTRA) injection  7.5 mg Subcutaneous Q24H   HYDROmorphone   Intravenous Q4H   pantoprazole  40 mg Oral BID   polyethylene glycol  17 g Oral BID   pregabalin  50 mg Oral TID   sodium chloride flush  10-40 mL Intracatheter Q12H   Continuous Infusions:  sodium chloride 100 mL/hr at 03/01/21 1821     LOS: 3 days    TEnzo Bi MD [facility]  If 7PM-7AM, please contact night-coverage  03/01/2021, 9:02 PM

## 2021-03-01 NOTE — Progress Notes (Signed)
Daily Progress Note   Patient Name: Brian Benton       Date: 03/01/2021 DOB: [birth year]  Age: 65 y.o. MRN#: [id number] Attending Physician: Enzo Bi, MD Primary Care Physician: [facility] Date: 02/25/2021  Reason for Consultation/Follow-up: Establishing goals of care, Pain control, and Psychosocial/spiritual support  Subjective:  Awakens easily, currently resting in bed, only able to lay on R side so as to avoid pressure on LLE. Patient's daughter is at bedside, a family meeting was held, see below.   Length of Stay: 3  Current Medications: Scheduled Meds:   sodium chloride   Intravenous Once   aspirin EC  81 mg Oral Daily   Chlorhexidine Gluconate Cloth  6 each Topical Daily   DULoxetine  60 mg Oral QHS   fentaNYL  1 patch Transdermal Q48H   fondaparinux (ARIXTRA) injection  7.5 mg Subcutaneous Q24H   HYDROmorphone   Intravenous Q4H   pantoprazole  40 mg Oral BID   polyethylene glycol  17 g Oral BID   pregabalin  50 mg Oral TID   sodium chloride flush  10-40 mL Intracatheter Q12H    Continuous Infusions:   PRN Meds: acetaminophen **OR** acetaminophen, diphenhydrAMINE **OR** diphenhydrAMINE, guaiFENesin, hydrALAZINE, ipratropium-albuterol, lactulose, LORazepam, metoprolol tartrate, naloxone **AND** sodium chloride flush, ondansetron (ZOFRAN) IV, oxyCODONE, prochlorperazine, sodium chloride flush, sodium chloride flush, traZODone  Physical Exam         Resting in bed Appears with mild generalized weakness Has left lower extremity ongoing discomfort Regular work of breathing Appears chronically ill  Vital Signs: BP 125/69 (BP Location: Left Arm)   Pulse 77   Temp 97.8 F (36.6 C) (Oral)   Resp 14   Ht 6' (1.829 m)   Wt 77.1 kg   SpO2  97%   BMI 23.06 kg/m  SpO2: SpO2: 97 % O2 Device: O2 Device: Nasal Cannula O2 Flow Rate: O2 Flow Rate (L/min): 2 L/min  Intake/output summary:  Intake/Output Summary (Last 24 hours) at 03/01/2021 1527 Last data filed at 02/28/2021 1811 Gross per 24 hour  Intake 240 ml  Output --  Net 240 ml   LBM: Last BM Date: 02/26/21 Baseline Weight: Weight: 77.1 kg Most recent weight: Weight: 77.1 kg       Palliative Assessment/Data:      Patient  Active Problem List   Diagnosis Date Noted   Left hip pain 02/26/2021   Deep vein thrombosis (DVT) of left iliofemoral vein (HCC) 02/25/2021   Mild protein malnutrition ([place]) 02/25/2021   Thrombocytopenia ([place]) 02/25/2021   Leukocytosis 02/25/2021   IDA (iron deficiency anemia) 09/22/2020   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 ([place]) 06/08/2020   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 ([place]) 03/06/2020   Leg swelling 03/02/2020   Paraspinal mass 03/02/2020   Acute deep vein thrombosis (DVT) of left lower extremity ([place]) 03/02/2020   Essential hypertension 10/31/2018   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  Recommendations/Plan: Goals of care discussion/family meeting: I met with the patient as well as his daughter Darrick Penna who was present at the bedside. Palliative medicine is specialized medical care for people living with serious illness. It focuses on providing relief from the symptoms and stress of a serious illness. The goal is to improve quality of  life for both the patient and the family. Goals of care: Broad aims of medical therapy in relation to the patient's values and preferences. Our aim is to provide medical care aimed at enabling patients to achieve the goals that matter most to them, given the circumstances of their particular medical situation and their constraints.   Patient lives with his wife in [place], [place].  Lollie Marrow is visiting from [place].  Lollie Marrow is a Production designer, theatre/television/film for hospice organization.  Lollie Marrow accompanied the patient to his recent palliative care outpatient appointment at [facility].  We discussed about the serious nature of the patient's illness.  Patient and family are aware that he does have progressive disease.  They had initial discussions with Dr. Marin Olp from oncology earlier this morning.  At this time, recommendations are being made for consideration for hospice.  Hence, we discussed about local hospices, differences between home with hospice versus residential hospice were explored.  We discussed about adequate pain and on pain symptom management.  Patient remains on transdermal fentanyl.  He has been started on Dilaudid bolus only PCA.  Will add a very low basal rate with his Dilaudid infusion and monitor.  The patient has expressed a desire for returning home towards the end of this hospitalization.  He will likely most benefit from addition of hospice support.  Palliative medicine team can follow along and continue broad goals of care discussions.  At this stage, also discussed about appropriateness of DNR/DNI briefly.  Patient wife and daughter will discuss amongst themselves today about all of these things.  Palliative medicine team will follow up again tomorrow 03-02-2021.  Goals of Care and Additional Recommendations: Limitations on Scope of Treatment: Currently full code, ongoing discussions about DNR DNI and home with hospice on appropriate symptom management.  Palliative medicine  team following closely  Code Status:    Code Status Orders  (From admission, onward)           Start     Ordered   02/25/21 2215  Full code  Continuous  02/25/21 2244           Code Status History     Date Active Date Inactive Code Status Order ID Comments User Context   03/13/2020 1019 03/15/2020 0515 Full Code [id number]  Elam Dutch, MD Inpatient   03/02/2020 2020 03/06/2020 1955 Full Code [id number]  Chotiner, Yevonne Aline, MD Inpatient      Advance Directive Documentation    Flowsheet Row Most Recent Value  Type of Advance Directive Healthcare Power of Attorney, Living will  Pre-existing out of facility DNR order (yellow form or pink MOST form) --  "MOST" Form in Place? --       Prognosis:  Unable to determine  Discharge Planning: To Be Determined  Care plan was discussed with patient and daughter present at bedside.  Also discussed with bedside RN.  Also briefly corresponded with [facility] MD via secure chat.  Thank you for allowing the Palliative Medicine Team to assist in the care of this patient.   Time In: 1400 Time Out: 1435 Total Time 35 Prolonged Time Billed  no       Greater than 50%  of this time was spent counseling and coordinating care related to the above assessment and plan.  Loistine Chance, MD  Please contact Palliative Medicine Team phone at [phone] for questions and concerns.

## 2021-03-02 DIAGNOSIS — G893 Neoplasm related pain (acute) (chronic): Secondary | ICD-10-CM | POA: Diagnosis not present

## 2021-03-02 DIAGNOSIS — K5903 Drug induced constipation: Secondary | ICD-10-CM | POA: Diagnosis not present

## 2021-03-02 DIAGNOSIS — Z515 Encounter for palliative care: Secondary | ICD-10-CM | POA: Diagnosis not present

## 2021-03-02 DIAGNOSIS — I82422 Acute embolism and thrombosis of left iliac vein: Secondary | ICD-10-CM | POA: Diagnosis not present

## 2021-03-02 DIAGNOSIS — T402X5A Adverse effect of other opioids, initial encounter: Secondary | ICD-10-CM

## 2021-03-02 LAB — CBC
HCT: 23.8 % — ABNORMAL LOW (ref 39.0–52.0)
Hemoglobin: 7.6 g/dL — ABNORMAL LOW (ref 13.0–17.0)
MCH: 31.9 pg (ref 26.0–34.0)
MCHC: 31.9 g/dL (ref 30.0–36.0)
MCV: 100 fL (ref 80.0–100.0)
Platelets: 101 10*3/uL — ABNORMAL LOW (ref 150–400)
RBC: 2.38 MIL/uL — ABNORMAL LOW (ref 4.22–5.81)
RDW: 19.3 % — ABNORMAL HIGH (ref 11.5–15.5)
WBC: 15.8 10*3/uL — ABNORMAL HIGH (ref 4.0–10.5)
nRBC: 0 % (ref 0.0–0.2)

## 2021-03-02 LAB — BASIC METABOLIC PANEL
Anion gap: 8 (ref 5–15)
BUN: 29 mg/dL — ABNORMAL HIGH (ref 8–23)
CO2: 22 mmol/L (ref 22–32)
Calcium: 7.9 mg/dL — ABNORMAL LOW (ref 8.9–10.3)
Chloride: 107 mmol/L (ref 98–111)
Creatinine, Ser: 0.94 mg/dL (ref 0.61–1.24)
GFR, Estimated: >60 mL/min (ref >60)
Glucose, Bld: 150 mg/dL — ABNORMAL HIGH (ref 70–99)
Potassium: 4.1 mmol/L (ref 3.5–5.1)
Sodium: 137 mmol/L (ref 135–145)

## 2021-03-02 LAB — MAGNESIUM: Magnesium: 1.8 mg/dL (ref 1.7–2.4)

## 2021-03-02 MED ORDER — SODIUM CHLORIDE 0.9 % IV SOLN: INTRAVENOUS | Status: AC

## 2021-03-02 MED ORDER — HYDROMORPHONE 1 MG/ML IV SOLN
INTRAVENOUS | Status: DC
  Administered 2021-03-02: 4.69 mg via INTRAVENOUS
  Administered 2021-03-03: 4.43 mg via INTRAVENOUS
  Administered 2021-03-03: 4.61 mg via INTRAVENOUS
  Administered 2021-03-03: 7.7 mg via INTRAVENOUS
  Administered 2021-03-03: 3.63 mg via INTRAVENOUS
  Filled 2021-03-02: qty 30

## 2021-03-02 MED ORDER — FONDAPARINUX SODIUM 7.5 MG/0.6ML ~~LOC~~ SOLN: 7.5000 mg | SUBCUTANEOUS | Status: DC

## 2021-03-02 NOTE — Plan of Care (Signed)
  Problem: Pain Managment: Goal: General experience of comfort will improve Outcome: Progressing   Problem: Safety: Goal: Ability to remain free from injury will improve Outcome: Progressing   Problem: Skin Integrity: Goal: Risk for impaired skin integrity will decrease Outcome: Progressing   

## 2021-03-02 NOTE — Progress Notes (Signed)
PROGRESS NOTE    Brian Benton  JJO:[id number] DOB: 08-27-1955 DOA: 02/25/2021 PCP: [facility]   Brief Narrative:  65 year old with history of metastatic bladder cancer status post TURBT, HLD, HTN, iron deficiency anemia, thrombocytopenia, tobacco use, left iliofemoral DVT on Eliquis comes to the hospital with worsening left hip pain for 3-4 days.  Lower extremity Doppler and CT abdomen pelvis with contrast showed persistent complete occlusion of indwelling left iliofemoral venous stent with interval increase of occlusive inferior vena cava thrombus.  Circumferential bladder wall thickening.  Interventional radiology was consulted for thrombectomy and patient is being transferred to [facility].  On the day of transfer his hemoglobin was noted to be 6.2 therefore units PRBC transfusion was ordered.  His anemia is thought to be iron deficient.  Hematology oncology is following.   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 --started on PCA on 9/18 by palliative provider, however, pain still uncontrolled. Plan: --plan for superior hypogastric nerve block on 9/22 --Hold Arixtra prior to procedure --cont PCA as managed by palliative care  provider --cont Fentanyl patch and oxycodone PRN --cont Cymbalta and Lyrica  History of metastatic bladder cancer - management per Dr. Marin Olp --palliative consult  --cont goals of care discussion  Anemia of chronic disease and iron deficiency - Iron deficiency and chemotherapy related.  No obvious evidence of bleeding.  He states he had colonoscopy about 2 years ago and did not show any significant abnormal findings.   --Hemoglobin dropped to 6.7 on 9/17 and received 2 units of PRBC.   Plan: --Monitor Hgb and transfuse if <7  Thrombocytopenia  Constipation - scheduled Miralax BID  Leukocytosis - No evidence of infection.  UA is negative.  Chest x-ray does not show any active disease.  Procalcitonin 0.5.  Suspect his leukocytosis is reactive. --hold abx  Essential hypertension, not currently active -Does not appear to be on home meds.   Poor oral intake --repeat MIVF@100  for 10 hours --encourage oral hydration  Mild protein calorie malnutrition -Encourage oral intake  Tobacco use -Nicotine patch as needed  Superficial pressure ulcer on his right buttock area.  Advised nursing staff to put pressure dressing on it for cushioning.    DVT prophylaxis: SCD Code Status: Full Family Communication:  updated daughter at bedside today  Dispo: The patient is from: Home              Anticipated d/c is to: Home              Patient currently is not medically stable to d/c.  On PCA, pending nerve block    Difficult to place patient No   Subjective: Pt reported pain uncontrolled overnight on PCA, only became more comfortable after PRN oxycodone.  Poor oral intake.  Examination:  Constitutional: NAD, lethargic, oriented CV: No cyanosis.   RESP: normal respiratory effort, on RA Neuro: II - XII grossly intact.   Psych: depressed mood and affect.     Objective: Vitals:   03/02/21 0532 03/02/21 0800 03/02/21 0954 03/02/21 1201  BP:  132/76  113/71  Pulse:  94  89   Resp: 13 16  17   Temp:  98.5 F (36.9 C)  98.6 F (37 C)  TempSrc:  Oral  Oral  SpO2: 96% 100% 100% 98%  Weight:      Height:        Intake/Output Summary (Last 24 hours) at 03/02/2021 1651 Last data filed at 03/02/2021 0532 Gross per 24 hour  Intake 240 ml  Output 350 ml  Net -110 ml   Filed Weights   02/25/21 1416 02/25/21 1557  Weight: 77.1 kg 77.1 kg     Data Reviewed:   CBC: Recent Labs  Lab 02/24/21 1047 02/24/21 1047 02/25/21 1639 02/26/21 0515 02/27/21 0222 02/28/21 0437 03/01/21 0434 03/02/21 0515  WBC 20.8*   < > 16.7* 17.6* 14.1* 14.1* 13.6* 15.8*  NEUTROABS 18.7*  --  15.5*  --   --   --   --   --   HGB 10.1*   < > 9.8* 8.8* 6.7* 8.5* 9.1* 7.6*  HCT 30.8*  --  30.6* 27.0* 20.5* 26.2* 28.3* 23.8*  MCV 96.0  --  99.0 97.5 96.7 98.1 100.4* 100.0  PLT 73*   < > 48* 49* 65* 82* 89* 101*   < > = values in this interval not displayed.   Basic Metabolic Panel: Recent Labs  Lab 02/26/21 0515 02/27/21 0222 02/28/21 0437 03/01/21 0434 03/02/21 0515  NA 134* 138 143 141 137  K 3.4* 3.8 3.9 4.2 4.1  CL 99 104 108 108 107  CO2 22 26 27 26 22   GLUCOSE 130* 197* 119* 93 150*  BUN 30* 30* 31* 31* 29*  CREATININE 0.86 0.75 0.53* 0.98 0.94  CALCIUM 8.6* 8.4* 8.6* 8.5* 7.9*  MG  --  1.9 2.2 1.9 1.8   GFR: Estimated Creatinine Clearance: 84.3 mL/min (by C-G formula based on SCr of 0.94 mg/dL). Liver Function Tests: Recent Labs  Lab 02/24/21 1047 02/25/21 1639  AST 12* 19  ALT 19 22  ALKPHOS 57 68  BILITOT 0.7 1.2  PROT 5.7* 6.2*  ALBUMIN 3.7 3.4*   No results for input(s): LIPASE, AMYLASE in the last 168 hours. No results for input(s): AMMONIA in the last 168 hours. Coagulation Profile: Recent Labs  Lab 02/25/21 2255  INR 1.2   Cardiac Enzymes: No results for input(s): CKTOTAL, CKMB, CKMBINDEX, TROPONINI in the last 168 hours. BNP (last 3 results) No results for input(s): PROBNP in the last 8760 hours. HbA1C: No results for input(s):  HGBA1C in the last 72 hours. CBG: No results for input(s): GLUCAP in the last 168 hours. Lipid Profile: No results for input(s): CHOL, HDL, LDLCALC, TRIG, CHOLHDL, LDLDIRECT in the last 72 hours. Thyroid Function Tests: No results for input(s): TSH, T4TOTAL, FREET4, T3FREE, THYROIDAB in the last 72 hours. Anemia Panel: Recent Labs    02/28/21 1419  VITAMINB12 302  FOLATE 10.1   Sepsis Labs: Recent Labs  Lab 02/26/21 0515  PROCALCITON 0.54    No results found for this or any previous visit (from the past 240 hour(s)).       Radiology Studies: No results found.      Scheduled Meds:  sodium chloride  Intravenous Once   aspirin EC  81 mg Oral Daily   Chlorhexidine Gluconate Cloth  6 each Topical Daily   DULoxetine  60 mg Oral QHS   fentaNYL  1 patch Transdermal Q48H   [START ON 03/05/2021] fondaparinux (ARIXTRA) injection  7.5 mg Subcutaneous Q24H   HYDROmorphone   Intravenous Q4H   pantoprazole  40 mg Oral BID   polyethylene glycol  17 g Oral BID   pregabalin  50 mg Oral TID   sodium chloride flush  10-40 mL Intracatheter Q12H   Continuous Infusions:  sodium chloride 75 mL/hr at 03/02/21 1348     LOS: 4 days    Enzo Bi, MD [facility]  If 7PM-7AM, please contact night-coverage  03/02/2021, 4:51 PM

## 2021-03-02 NOTE — Progress Notes (Signed)
Referring Physician(s): Ennever,P  Supervising Physician: Corrie Mckusick  Patient Status:  [facility] - In-pt  Chief Complaint:  Left hip pain  Subjective: Pt cont to have left hip pain; denies fever,HA,CP,dyspnea, cough, worsening abd pain,N/V or bleeding; asked by Dr. Marin Olp to see if there was interventional nerve block option to help with pain control; currently on PCA dilaudid; known hx met bladder ca, HLD,HTN,anemia, thrombocytopenia, left iliofemoral DVT/stent occlusion/IVC thrombus (?tumor thrombus); on arixtra and baby aspirin   Allergies: Patient has no known allergies.  Medications: Prior to Admission medications   Medication Sig Start Date End Date Taking? Authorizing Provider  acetaminophen (TYLENOL) 500 MG tablet Take 500 mg by mouth every 6 (six) hours as needed for moderate pain.   Yes [provider]  apixaban (ELIQUIS) 5 MG TABS tablet TAKE 1 TABLET (5 MG TOTAL) BY MOUTH 2 (TWO) TIMES DAILY. 02/02/21 02/02/22 Yes Ennever, Rudell Cobb, MD  ASPIRIN LOW DOSE 81 MG EC tablet TAKE 1 TABLET BY MOUTH EVERY DAY 10/12/20  Yes Patwardhan, Manish J, MD  DULoxetine (CYMBALTA) 30 MG capsule Take 30-60 mg by mouth as directed. Take 1 capsule (30 mg) at bedtime for 7 Days Then Take 2 capsules (60 mg) at bedtime   Yes [provider]  fentaNYL (DURAGESIC) 100 MCG/HR Place 1 patch onto the skin every other day. 02/10/21  Yes Ennever, Rudell Cobb, MD  lactulose, encephalopathy, (CHRONULAC) 10 GM/15ML SOLN TAKE 30MLS BY MOUTH EVERY 4 HOURS AS NEEDED Patient taking differently: Take 30 g by mouth every 4 (four) hours as needed. 06/26/20 06/26/21 Yes Ennever, Rudell Cobb, MD  lidocaine-prilocaine (EMLA) cream Apply to affected area once Patient taking differently: Apply 1 application topically as directed. Access port 01/13/21  Yes Ennever, Rudell Cobb, MD  Multiple Vitamins-Minerals (MULTIVITAMIN WITH MINERALS) tablet Take 1 tablet by mouth every other day.   Yes [provider]   naloxone (NARCAN) nasal spray 4 mg/0.1 mL Place 1 spray into the nose once. 02/22/21  Yes [provider]  oxyCODONE (OXYCONTIN) 10 mg 12 hr tablet Take 1 tablet (10 mg total) by mouth every 4 (four) hours as needed. 01/21/21  Yes Ennever, Rudell Cobb, MD  Oxycodone HCl 10 MG TABS TAKE 1 TABLET BY MOUTH EVERY 4 HOURS AS NEEDED Patient taking differently: Take 10-20 mg by mouth every 4 (four) hours as needed (pain). 02/22/21 08/21/21 Yes Ennever, Rudell Cobb, MD  pantoprazole (PROTONIX) 40 MG tablet Take 1 tablet (40 mg total) by mouth 2 (two) times daily. 01/13/21 01/13/22 Yes Ennever, Rudell Cobb, MD  polyethylene glycol (MIRALAX / GLYCOLAX) 17 g packet Take 17 g by mouth daily.   Yes [provider]  pregabalin (LYRICA) 50 MG capsule Take 50 mg by mouth as directed. Take 1 capsule (50 mg) at bedtime for 3 nights, Take 1 capsule (50 mg) BID for 3 Days, Then Take 1 capsule (50 mg) TID 02/22/21  Yes [provider]  sodium chloride (OCEAN) 0.65 % SOLN nasal spray Place 1 spray into both nostrils daily as needed for congestion (Allergies).   Yes [provider]  traMADol (ULTRAM) 50 MG tablet TAKE 2 TABLETS BY MOUTH EVERY 6 HOURS AS NEEDED. Patient taking differently: Take 100 mg by mouth every 6 (six) hours as needed for moderate pain. 02/08/21  Yes Ennever, Rudell Cobb, MD  dexamethasone (DECADRON) 4 MG tablet Take 2 tablets (8 mg total) by mouth daily. Start the day after chemotherapy for 2 days. Patient not taking: No sig reported  01/13/21   Volanda Napoleon, MD  dexamethasone (DECADRON) 6 MG tablet Take 3 tablets (18 mg total) by mouth daily. **You MUST take with food in the morning.** Patient not taking: No sig reported 01/13/21   Volanda Napoleon, MD  gabapentin (NEURONTIN) 600 MG tablet TAKE 1-2 TABLETS (600 MG TOTAL) BY MOUTH IN THE MORNING, AT NOON, IN THE EVENING,AND AT BEDTIME Patient not taking: No sig reported 12/23/20   Volanda Napoleon, MD  gabapentin (NEURONTIN) 600 MG tablet  Take 2 tablets (1,200 mg total) by mouth in the morning, at noon, in the evening, and at bedtime. Patient not taking: No sig reported 01/05/21 01/05/22  Volanda Napoleon, MD  HYDROmorphone (DILAUDID) 8 MG tablet Take 1 tablet (8 mg total) by mouth every 6 (six) hours as needed for severe pain. Patient not taking: No sig reported 01/13/21   Volanda Napoleon, MD  LORazepam (ATIVAN) 0.5 MG tablet Take 1 tablet (0.5 mg total) by mouth every 6 (six) hours as needed (Nausea or vomiting). Patient not taking: No sig reported 01/13/21   Volanda Napoleon, MD  LORazepam (ATIVAN) 0.5 MG tablet Take 2 tablets (1 mg total) by mouth every 6 (six) hours as needed for anxiety. Patient not taking: No sig reported 02/16/21 08/15/21  Volanda Napoleon, MD  meloxicam (MOBIC) 15 MG tablet TAKE 1 TABLET (15 MG TOTAL) BY MOUTH DAILY. Patient not taking: No sig reported 02/22/21 02/22/22  Volanda Napoleon, MD  ondansetron (ZOFRAN) 8 MG tablet Take 1 tablet (8 mg total) by mouth 2 (two) times daily as needed for refractory nausea / vomiting. Start on day 3 after chemo. 01/13/21   Volanda Napoleon, MD  ondansetron (ZOFRAN-ODT) 8 MG disintegrating tablet TAKE 1 TABLET BY MOUTH EVERY 8 HOURS AS NEEDED FOR NAUSEA & VOMITING 09/14/20 09/14/21  Volanda Napoleon, MD  orphenadrine (NORFLEX) 100 MG tablet TAKE 1 TABLET (100 MG TOTAL) BY MOUTH 2 (TWO) TIMES DAILY. Patient not taking: Reported on 02/25/2021 11/03/20   Volanda Napoleon, MD  prochlorperazine (COMPAZINE) 10 MG tablet TAKE 1 TABLET (10 MG TOTAL) BY MOUTH EVERY 6 (SIX) HOURS AS NEEDED FOR NAUSEA OR VOMITING. Patient not taking: No sig reported 09/18/20 09/18/21  Volanda Napoleon, MD  prochlorperazine (COMPAZINE) 10 MG tablet Take 1 tablet (10 mg total) by mouth every 6 (six) hours as needed (Nausea or vomiting). Patient not taking: No sig reported 01/13/21   Volanda Napoleon, MD  temazepam (RESTORIL) 15 MG capsule Take 1 capsule (15 mg total) by mouth at bedtime as needed for sleep. Patient not  taking: Reported on 02/25/2021 02/02/21   Volanda Napoleon, MD  enoxaparin (LOVENOX) 80 MG/0.8ML injection Inject 0.8 mLs (80 mg total) into the skin every 12 (twelve) hours. 05/20/20 07/29/20  Volanda Napoleon, MD     Vital Signs: BP 113/71 (BP Location: Left Arm)   Pulse 89   Temp 98.6 F (37 C) (Oral)   Resp 17   Ht 6' (1.829 m)   Wt 170 lb (77.1 kg)   SpO2 98%   BMI 23.06 kg/m   Physical Exam awake/alert; chest- CTA bilat; rt chest port ok; heart- RRR; abd- soft,+BS,NT; no sig pretibial edema  Imaging: MR Lumbar Spine W Wo Contrast  Result Date: 02/28/2021 CLINICAL DATA:  Urologic cancer, assess treatment response On treatment. Assess for progressive disease. Metastatic bladder cancer. EXAM: MRI LUMBAR SPINE WITHOUT AND WITH CONTRAST TECHNIQUE: Multiplanar and multiecho pulse sequences of the lumbar  spine were obtained without and with intravenous contrast. CONTRAST:  7.39m GADAVIST GADOBUTROL 1 MMOL/ML IV SOLN COMPARISON:  Lumbar spine CT 02/25/2021. Lumbar spine MRI 01/04/2021. FINDINGS: Multiple sequences are severely motion degraded including all axial sequences (particularly severe on the axial postcontrast T1 sequence). Segmentation: Standard. Alignment:  Trace retrolisthesis of L2 on L3. Vertebrae: No acute lumbar spine fracture. Known tumor eroding the left sacral ala which has likely mildly progressed and mildly involves the left lateral aspect of the L5 vertebral body. Conus medullaris and cauda equina: Conus extends to the L1-2 level. Conus and cauda equina appear normal. Paraspinal and other soft tissues: Known large soft tissue mass in the retroperitoneum extending along the left iliopsoas musculature with sacral ala and left L5 vertebral body involvement as noted above. Tumor extends into the left L5-S1 neural foramen. Complete assessment of tumor is limited by incomplete coverage on this lumbar MRI and motion artifact, better evaluated on the recent CT. Artifact from left iliac  venous stent. Chronic bladder wall thickening. Bilateral renal cysts. Disc levels: Mild disc bulging at L2-3, L3-4, and L4-5. Moderate multilevel facet hypertrophy. Mild bilateral neural foraminal stenosis at L4-5. No significant spinal stenosis. IMPRESSION: 1. Severely motion degraded examination. 2. 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 Electronically Signed   By: ALogan BoresM.D.   On: 02/28/2021 14:07    Labs:  CBC: Recent Labs    02/27/21 0222 02/28/21 0437 03/01/21 0434 03/02/21 0515  WBC 14.1* 14.1* 13.6* 15.8*  HGB 6.7* 8.5* 9.1* 7.6*  HCT 20.5* 26.2* 28.3* 23.8*  PLT 65* 82* 89* 101*    COAGS: Recent Labs    06/01/20 1217 06/08/20 0725 06/17/20 0730 02/25/21 2255 02/26/21 0656 02/27/21 0222 02/27/21 0841 02/28/21 0437 02/28/21 1419  INR 1.1 0.9 0.9 1.2  --   --   --   --   --   APTT  --   --   --  40*   < > 98* 107* 112* 84*   < > = values in this interval not displayed.    BMP: Recent Labs    03/03/20 0733 03/04/20 0337 03/11/20 0813 03/18/20 0858 02/27/21 0222 02/28/21 0437 03/01/21 0434 03/02/21 0515  NA 134* 134* 134*   < > 138 143 141 137  K 4.8 4.1 4.8   < > 3.8 3.9 4.2 4.1  CL 98 100 98   < > 104 108 108 107  CO2 25 26 32   < > '26 27 26 22  ' GLUCOSE 121* 131* 94   < > 197* 119* 93 150*  BUN '18 13 14   ' < > 30* 31* 31* 29*  CALCIUM 9.3 8.7* 10.2   < > 8.4* 8.6* 8.5* 7.9*  CREATININE 0.74 0.69 0.72   < > 0.75 0.53* 0.98 0.94  GFRNONAA >60 >60 >60   < > >60 >60 >60 >60  GFRAA >60 >60 >60  --   --   --   --   --    < > = values in this interval not displayed.    LIVER FUNCTION TESTS: Recent Labs    02/10/21 1155 02/18/21 0941 02/24/21 1047 02/25/21 1639  BILITOT 0.5 0.5 0.7 1.2  AST 9* 15 12* 19  ALT '14 22 19 22  ' ALKPHOS 54 54 57 68  PROT 6.2* 5.7* 5.7* 6.2*  ALBUMIN 3.6 3.6 3.7 3.4*    Assessment and  Plan: Pt with known  hx met bladder ca, HLD,HTN,anemia, thrombocytopenia, left iliofemoral DVT/stent occlusion/IVC thrombus (?tumor thrombus); on arixtra and baby aspirin; now with persistent left hip pain despite PCA dilaudid- has known  left-sided retroperitoneal tumor involving the left sacral ala, L5 vertebral body, and left L5-S1 neural foramen, mildly progressed from 12/2020. Request received for nerve block to assist with pt's pain management. Case/imaging studies have been reviewed by Dr. Earleen Newport and d/w Dr. Marin Olp. Plan at this time is to proceed with superior hypogastric nerve block on 9/22. If successful could consider neurolysis at later date. Details/risks of procedure, incl but not limited to internal bleeding, infection, inability to erdiacte pain d/w pt with his understanding and consent. Will hold arixtra until after above procedure.   Electronically Signed: D. Rowe Robert, PA-C 03/02/2021, 3:50 PM   I spent a total of 20 minutes at the the patient's bedside AND on the patient's hospital floor or unit, greater than 50% of which was counseling/coordinating care for image guided superior hypogastric nerve block    Patient ID: Brian Benton, male   DOB: 11-08-1955, 66 y.o.   MRN: [id number]

## 2021-03-02 NOTE — Progress Notes (Signed)
Daily Progress Note   Patient Name: Brian Benton       Date: 03/02/2021 DOB: July 17, 1955  Age: 65 y.o. MRN#: [id number] Attending Physician: Enzo Bi, MD Primary Care Physician: [facility] Date: 02/25/2021  Reason for Consultation/Follow-up: Establishing goals of care, Pain control, and Psychosocial/spiritual support  Subjective: I saw and examined Brian Benton today.  His daughter was present at the bedside.  PCA reviewed and he has used 20mg  of dilaudid in the last 24 hours and has been averaging 1mg  per hour for the last 8 hours.  Reports that overall pain is better, but he reaches point where it creeps out of control and it takes a long time to get back under control.  We also discussed potential for IR procedure to hopefully relieve pain if neurolysis can be performed.  Concepts specific to code status and rehospitalization discussed.  We discussed difference between a aggressive medical intervention path and a palliative, comfort focused care path.  Concept of Hospice and Palliative Care were discussed.  He does report eventual goal to transition home with hospice services.   Questions and concerns addressed.   PMT will continue to support holistically.   Length of Stay: 4  Current Medications: Scheduled Meds:  . sodium chloride   Intravenous Once  . aspirin EC  81 mg Oral Daily  . Chlorhexidine Gluconate Cloth  6 each Topical Daily  . DULoxetine  60 mg Oral QHS  . fentaNYL  1 patch Transdermal Q48H  . [START ON 03/05/2021] fondaparinux (ARIXTRA) injection  7.5 mg Subcutaneous Q24H  . HYDROmorphone   Intravenous Q4H  . pantoprazole  40 mg Oral BID  . polyethylene glycol  17 g Oral BID  . pregabalin  50 mg Oral TID  . sodium chloride flush  10-40  mL Intracatheter Q12H    Continuous Infusions: . sodium chloride 75 mL/hr at 03/02/21 1348    PRN Meds: acetaminophen **OR** acetaminophen, diphenhydrAMINE **OR** diphenhydrAMINE, guaiFENesin, hydrALAZINE, ipratropium-albuterol, lactulose, LORazepam, metoprolol tartrate, naloxone **AND** sodium chloride flush, ondansetron (ZOFRAN) IV, oxyCODONE, prochlorperazine, sodium chloride flush, sodium chloride flush, traZODone  Physical Exam         Resting in bed Appears with mild generalized weakness Has left lower extremity ongoing discomfort Regular work of breathing Appears chronically ill  Vital Signs:  BP 135/75 (BP Location: Left Arm)   Pulse 95   Temp 99.5 F (37.5 C) (Oral)   Resp 18   Ht 6' (1.829 m)   Wt 77.1 kg   SpO2 99%   BMI 23.06 kg/m  SpO2: SpO2: 99 % O2 Device: O2 Device: Room Air O2 Flow Rate: O2 Flow Rate (L/min): 1 L/min  Intake/output summary:  Intake/Output Summary (Last 24 hours) at 03/02/2021 2146 Last data filed at 03/02/2021 0532 Gross per 24 hour  Intake --  Output 350 ml  Net -350 ml    LBM: Last BM Date: 02/28/21 Baseline Weight: Weight: 77.1 kg Most recent weight: Weight: 77.1 kg       Palliative Assessment/Data:      Patient Active Problem List   Diagnosis Date Noted  . Palliative care by specialist   . Goals of care, counseling/discussion   . Left hip pain 02/26/2021  . Deep vein thrombosis (DVT) of left iliofemoral vein ([place]) 02/25/2021  . Mild protein malnutrition ([place]) 02/25/2021  . Thrombocytopenia ([place]) 02/25/2021  . Leukocytosis 02/25/2021  . IDA (iron deficiency anemia) 09/22/2020  . 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 ([place]) 03/06/2020  . Leg swelling 03/02/2020  . Paraspinal mass 03/02/2020  . Acute deep vein thrombosis (DVT) of left lower extremity ([place]) 03/02/2020  . Essential hypertension 10/31/2018  . 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 Mixed hyperlipidemia 10/03/2018  . NEVUS, ATYPICAL 10/23/2007  . 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  Recommendations/Plan: Pain, cancer related: Continue dilaudid PCA.  Increase basal rate to 0.5mg  per hour.  Continue 0.3mg  bolus with 20 minute lockout.  Agree with plan for IR evaluation. Constipation, opioid related.  Continue same and plan to reassess tomorrow. Eventual goal is to transition home with hospice. Discussed code status and he is continuing to consider.  Goals of Care and Additional Recommendations: Limitations on Scope of Treatment: Currently full code, ongoing discussions about DNR DNI and home with hospice on appropriate symptom management.  Palliative medicine team following closely  Code Status:    Code Status Orders  (From admission, onward)           Start     Ordered   02/25/21 2215  Full code  Continuous        02/25/21 2244           Code Status History     Date Active Date Inactive Code Status Order ID Comments User Context   03/13/2020 1019 03/15/2020 0515 Full Code [id number]  Elam Dutch, MD Inpatient   03/02/2020 2020 03/06/2020 1956 Full Code [id number]  Chotiner, Yevonne Aline, MD Inpatient      Advance Directive Documentation    Flowsheet Row Most Recent Value  Type of Advance Directive Healthcare Power of Attorney, Living will  Pre-existing out of facility DNR order (yellow form or pink MOST form) --  "MOST" Form in Place? --       Prognosis:  Unable to determine  Discharge Planning: To Be Determined  Care plan was discussed with patient and daughter  present at bedside.  Also discussed with bedside RN.  Thank you for allowing the Palliative Medicine Team to assist in the care of this patient.   Time In: 1400 Time Out: 1440 Total Time 40 Prolonged Time Billed  no   Greater than 50%  of this time was spent counseling and coordinating care related to the above assessment and plan.  Micheline Rough, MD  Please contact Palliative Medicine Team phone at [phone] for questions and concerns.

## 2021-03-02 NOTE — Progress Notes (Signed)
The main issue with Brian Benton is going be pain control.  He is on a PCA right now.  This does seem to be helping on occasion.  I did speak with interventional radiology.  They said that they can try a procedure in which they can try to ablate one of his nerves.  They said they can do this on Thursday.  What they would do is that tomorrow, they would do a local anesthetic to see if this might help.  I know he would like to go home.  Thankfully, his daughter is up from [place].  She deals with Hospice in [place].  She really will help him out given his overall status.  He is on Arixtra now.  This is for the thromboembolic disease.  He just is not all that active.  I am not sure he can really be all that active given his pain issues.  I debrided all think he needs to have the cardiac monitor now.  I think his daughter, when I talked to her yesterday, said that she would talk with he and his wife about the CODE STATUS issues and what the request is.  His labs show white cell count 15.8.  Hemoglobin 7.6.  Platelet count 101,000.  It troubles me that the hemoglobin is trending downward.  He does have mild renal insufficiency.  I know that he has had IV iron in the past.  I think he has had Aranesp in the past.  Clearly, our goal is just quality of life.  I really want to try to make his life better if we can and that really comes down to him not having pain.  One option that I thought about with him was to use a CADD pump which is portable and we can program it for a basal rate of pain medication that he can give himself bolus doses.  May be, if Interventional Radiology can do the neurolysis, this would really help with his pain and allow him to be a little more functional.  I know this is incredibly complicated.  I really appreciate everybody's help on 5 E.  I know the staff has been incredibly compassionate with he and his wife.   Brian Haw, MD  1 Thessalonians 5:11

## 2021-03-03 DIAGNOSIS — K5903 Drug induced constipation: Secondary | ICD-10-CM | POA: Diagnosis not present

## 2021-03-03 DIAGNOSIS — G893 Neoplasm related pain (acute) (chronic): Secondary | ICD-10-CM | POA: Diagnosis not present

## 2021-03-03 DIAGNOSIS — I10 Essential (primary) hypertension: Secondary | ICD-10-CM | POA: Diagnosis not present

## 2021-03-03 DIAGNOSIS — D72829 Elevated white blood cell count, unspecified: Secondary | ICD-10-CM | POA: Diagnosis not present

## 2021-03-03 DIAGNOSIS — E441 Mild protein-calorie malnutrition: Secondary | ICD-10-CM | POA: Diagnosis not present

## 2021-03-03 DIAGNOSIS — Z515 Encounter for palliative care: Secondary | ICD-10-CM | POA: Diagnosis not present

## 2021-03-03 DIAGNOSIS — I82422 Acute embolism and thrombosis of left iliac vein: Secondary | ICD-10-CM | POA: Diagnosis not present

## 2021-03-03 DIAGNOSIS — D696 Thrombocytopenia, unspecified: Secondary | ICD-10-CM

## 2021-03-03 LAB — CBC
HCT: 20.8 % — ABNORMAL LOW (ref 39.0–52.0)
Hemoglobin: 6.5 g/dL — CL (ref 13.0–17.0)
MCH: 32.2 pg (ref 26.0–34.0)
MCHC: 31.3 g/dL (ref 30.0–36.0)
MCV: 103 fL — ABNORMAL HIGH (ref 80.0–100.0)
Platelets: 112 10*3/uL — ABNORMAL LOW (ref 150–400)
RBC: 2.02 MIL/uL — ABNORMAL LOW (ref 4.22–5.81)
RDW: 19.1 % — ABNORMAL HIGH (ref 11.5–15.5)
WBC: 16.4 10*3/uL — ABNORMAL HIGH (ref 4.0–10.5)
nRBC: 0 % (ref 0.0–0.2)

## 2021-03-03 LAB — PREALBUMIN: Prealbumin: 12.3 mg/dL — ABNORMAL LOW (ref 18–38)

## 2021-03-03 LAB — IRON AND TIBC
Iron: 10 ug/dL — ABNORMAL LOW (ref 45–182)
Saturation Ratios: 6 % — ABNORMAL LOW (ref 17.9–39.5)
TIBC: 182 ug/dL — ABNORMAL LOW (ref 250–450)
UIBC: 172 ug/dL

## 2021-03-03 LAB — HEMOGLOBIN AND HEMATOCRIT, BLOOD
HCT: 23.6 % — ABNORMAL LOW (ref 39.0–52.0)
Hemoglobin: 7.4 g/dL — ABNORMAL LOW (ref 13.0–17.0)

## 2021-03-03 LAB — MAGNESIUM: Magnesium: 1.8 mg/dL (ref 1.7–2.4)

## 2021-03-03 LAB — PREPARE RBC (CROSSMATCH)

## 2021-03-03 MED ORDER — BISACODYL 10 MG RE SUPP: 10.0000 mg | Freq: Every day | RECTAL | Status: DC | PRN

## 2021-03-03 MED ORDER — SENNA 8.6 MG PO TABS
2.0000 | ORAL_TABLET | Freq: Two times a day (BID) | ORAL | Status: DC
  Administered 2021-03-03 – 2021-03-08 (×8): 17.2 mg via ORAL
  Filled 2021-03-03 (×11): qty 2

## 2021-03-03 MED ORDER — HYDROMORPHONE 1 MG/ML IV SOLN
INTRAVENOUS | Status: DC
  Administered 2021-03-03: 5.42 mg via INTRAVENOUS
  Administered 2021-03-04: 3.14 mg via INTRAVENOUS
  Administered 2021-03-04: 12.91 mg via INTRAVENOUS
  Administered 2021-03-04: 1 mL via INTRAVENOUS
  Administered 2021-03-04: 3.98 mg via INTRAVENOUS
  Administered 2021-03-04: 4.4 mg via INTRAVENOUS
  Administered 2021-03-05: 6.66 mg via INTRAVENOUS
  Administered 2021-03-05: 7.03 mL via INTRAVENOUS
  Administered 2021-03-05: 6.78 mL via INTRAVENOUS
  Administered 2021-03-05: 5.32 mg via INTRAVENOUS
  Administered 2021-03-05: 11.73 mL via INTRAVENOUS
  Administered 2021-03-06: 9.69 mg via INTRAVENOUS
  Administered 2021-03-06: 3.12 mg via INTRAVENOUS
  Administered 2021-03-06: 3.44 mg via INTRAVENOUS
  Administered 2021-03-06: 2.21 mg via INTRAVENOUS
  Administered 2021-03-06: 4.49 mg via INTRAVENOUS
  Administered 2021-03-06: 5.87 mg via INTRAVENOUS
  Administered 2021-03-06: 5.12 mg via INTRAVENOUS
  Administered 2021-03-07: 1.49 mg via INTRAVENOUS
  Filled 2021-03-03 (×12): qty 30

## 2021-03-03 MED ORDER — SODIUM CHLORIDE 0.9% IV SOLUTION: Freq: Once | INTRAVENOUS | Status: AC

## 2021-03-03 MED ORDER — HYDROMORPHONE HCL 1 MG/ML IJ SOLN
0.5000 mg | INTRAMUSCULAR | Status: AC | PRN
  Administered 2021-03-03: 0.5 mg via INTRAVENOUS
  Filled 2021-03-03: qty 0.5

## 2021-03-03 NOTE — Progress Notes (Addendum)
MD was notified after a big bruise was seen on the Right side of the pt's chest and abdomen. Pt on blood thinners and prefers to lay on bed R side most of the time that may have caused the big bruise. Pt denied any fall or injury. Family was also notified.   03/03/21 1100  Integumentary  Ecchymosis Location Abdomen;Chest (R side)  Ecchymosis Location Orientation Right  Ecchymosis Intervention Other (Comment) (MD was notified)  Urine Characteristics  Hygiene Peri care AND Foley care;[employer]

## 2021-03-03 NOTE — Progress Notes (Signed)
CRITICAL VALUE STICKER  CRITICAL VALUE: Hemoglobin 6.5  RECEIVER (on-site recipient of call): Garlon Hatchet, RN   DATE & TIME NOTIFIED: 03/03/2021 at [phone]  MESSENGER (representative from lab):Raelyn Ensign  MD NOTIFIED: Dr. Starla Link (face to face)  TIME OF NOTIFICATION: 03/03/2021 at 0923  RESPONSE: See new orders

## 2021-03-03 NOTE — Progress Notes (Signed)
Hopefully, Interventional Radiology will be able to do a nerve block on him.  I spoke with Dr. Earleen Newport yesterday.  It sounds like they will try to do a nerve block and hopefully do a neurolysis that can really help with the pain in the left hip.  It sounds like they can try to set this up for tomorrow.  I do not see any labs back yet today.  His hemoglobin is dropped somewhat.  His platelet count is coming up nicely.  Is hard to say how much he is eating.  He is having no nausea or vomiting.  He did not have a bowel movement yesterday.  He is on MiraLAX.  He says he does not need anything stronger.  There has been no problems with fever.  He has been no obvious bleeding.  I think if we can get the pain under control, we can certainly optimize his pain regimen.  Our goal clearly is still to improve his quality of life.  His vital signs are temperature 97.3.  Pulse 95.  Blood pressure 113/69.  His lungs are clear.  Cardiac exam regular rate and rhythm.  Abdomen is soft.  Bowel sounds are present.  He is on Lovenox.  We will have to have on outpatient Lovenox.  I know this is complicated.  Again, our goal is clearly to improve his pain so that he will will be more functional.  I have not yet approached him about end-of-life issues.  I will have to try to address this soon.  I know his daughter, who is involved with Hospice down in [place] will certainly help [place] out with this.  I do appreciate all the outstanding care he is getting from the great staff up on 5 E.  Lattie Haw, MD  Hebrews 12:12

## 2021-03-03 NOTE — Progress Notes (Signed)
Daily Progress Note   Patient Name: Brian Benton       Date: 03/03/2021 DOB: [birth year]  Age: 65 y.o. MRN#: [id number] Attending Physician: Aline August, MD Primary Care Physician: [facility] Date: 02/25/2021  Reason for Consultation/Follow-up: Establishing goals of care, Pain control, and Psychosocial/spiritual support  Subjective: I saw and examined Brian Benton today.  His daughter was present at the bedside.  PCA reviewed and he has used 26mg  of dilaudid in the last 24 hours.  He reports that overall pain is better, but he still has periods where he wakes up at night and pain is out of control.  We discussed options including increasing basal rate versus increasing his basal dosing.  He would like to try increasing his basal dose a little to see if this helps out as he states that he has pain but it takes him stacking a couple of doses to get it back under control.  Discussed that he has been averaging around a milligram per hour and if we would maintain a 0.5 mg basal he would probably need to push the button on average once per hour if we increase bolus dose to 0.5 mg.  We reviewed constipation and discussed options for bowel regimen.  He reports that he feels that "things are coming" but he has not had a bowel movement since Sunday.  Discussed addition of senna in addition to MiraLAX and also added as needed Dulcolax as he asked about getting suppository later.  His daughter requested PT evaluation as they are planning to take him home with hospice support at time of discharge but she needs to know his functional status to no what type of help he is going to need and what DME he will need at time of discharge.  We also discussed plan for IR procedure to see  if this helps reduce pain.  Because there is potential for him to have reduction in pain after procedure, we discussed not further titrating up his fentanyl patch.  They would like me to reach out to discuss best regimen for outpatient pain management with [facility] [name] as his pain may improve with procedure and [name] will be managing pain at time of discharge.  Questions and concerns addressed.   PMT will continue to support holistically.  Length of Stay: 5  Current Medications: Scheduled Meds:  . sodium chloride   Intravenous Once  . aspirin EC  81 mg Oral Daily  . Chlorhexidine Gluconate Cloth  6 each Topical Daily  . DULoxetine  60 mg Oral QHS  . fentaNYL  1 patch Transdermal Q48H  . [START ON 03/05/2021] fondaparinux (ARIXTRA) injection  7.5 mg Subcutaneous Q24H  . HYDROmorphone   Intravenous Q4H  . pantoprazole  40 mg Oral BID  . polyethylene glycol  17 g Oral BID  . pregabalin  50 mg Oral TID  . senna  2 tablet Oral BID  . sodium chloride flush  10-40 mL Intracatheter Q12H    Continuous Infusions:    PRN Meds: acetaminophen **OR** acetaminophen, bisacodyl, diphenhydrAMINE **OR** diphenhydrAMINE, guaiFENesin, hydrALAZINE, ipratropium-albuterol, LORazepam, metoprolol tartrate, naloxone **AND** sodium chloride flush, ondansetron (ZOFRAN) IV, oxyCODONE, prochlorperazine, sodium chloride flush, sodium chloride flush, traZODone  Physical Exam         Resting in bed Appears with mild generalized weakness Has left lower extremity ongoing discomfort Regular work of breathing Appears chronically ill  Vital Signs: BP 106/62 (BP Location: Left Arm)   Pulse 80   Temp 98.3 F (36.8 C) (Oral)   Resp 20   Ht 6' (1.829 m)   Wt 77.1 kg   SpO2 97%   BMI 23.06 kg/m  SpO2: SpO2: 97 % O2 Device: O2 Device: Room Air O2 Flow Rate: O2 Flow Rate (L/min): 0 L/min  Intake/output summary:  Intake/Output Summary (Last 24 hours) at 03/03/2021 1803 Last data filed at 03/03/2021  1411 Gross per 24 hour  Intake 827.29 ml  Output 300 ml  Net 527.29 ml    LBM: Last BM Date: 02/28/21 Baseline Weight: Weight: 77.1 kg Most recent weight: Weight: 77.1 kg       Palliative Assessment/Data:      Patient Active Problem List   Diagnosis Date Noted  . Palliative care by specialist   . Goals of care, counseling/discussion   . Left hip pain 02/26/2021  . Deep vein thrombosis (DVT) of left iliofemoral vein ([place]) 02/25/2021  . Mild protein malnutrition ([place]) 02/25/2021  . Thrombocytopenia ([place]) 02/25/2021  . Leukocytosis 02/25/2021  . IDA (iron deficiency anemia) 09/22/2020  . 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 Leg swelling 03/02/2020  . Paraspinal mass 03/02/2020  . Acute deep vein thrombosis (DVT) of left lower extremity ([place]) 03/02/2020  . Essential hypertension 10/31/2018  . 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 Mixed hyperlipidemia 10/03/2018  . NEVUS, ATYPICAL 10/23/2007  . 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  Recommendations/Plan: Pain, cancer related: Continue dilaudid PCA.  Continue basal rate of 0.5 mg/h.  Increase bolus dose to 0.5 mg with 20-minute lockout.  IR planning for trial injection tomorrow.  His daughter works for hospice agency and we discussed various options for pain management but these will greatly depend upon his response to trial of injection.  He will need both long and short acting medications at time of discharge.  We have been discussing potential for PCA  as this  may be the best way to control his pain once he transitions home.  For long-acting agent, today we discussed potential options of continuing fentanyl patch, discontinuation of fentanyl and increasing basal rate of Dilaudid via PCA, or addition of methadone for long-acting agent.  As noted, I will plan to reach out to [facility] [name] once we have a better idea what his overall needs will be based upon trial injection by IR to further discuss which of these options would be best in line with their continued plan for managing his pain long-term. Constipation, opioid related.  Continue MiraLAX.  Add senna 2 tabs twice daily.  Add Dulcolax suppository as needed. Eventual goal is to transition home with hospice. He is in the process of discussing acute considering CODE STATUS with his wife.  I did not push this conversation again today.  Goals of Care and Additional Recommendations: Limitations on Scope of Treatment: Currently full code, ongoing discussions about DNR DNI and home with hospice on appropriate symptom management.  Palliative medicine team following closely  Code Status:    Code Status Orders  (From admission, onward)           Start     Ordered   02/25/21 2215  Full code  Continuous        02/25/21 2244           Code Status History     Date Active Date Inactive Code Status Order ID Comments User Context   03/13/2020 1019 03/15/2020 0515 Full Code [id number]  Elam Dutch, MD Inpatient   03/02/2020 2020 03/06/2020 1955 Full Code [id number]  Chotiner, Yevonne Aline, MD Inpatient      Advance Directive Documentation    Flowsheet Row Most Recent Value  Type of Advance Directive Healthcare Power of Attorney, Living will  Pre-existing out of facility DNR order (yellow form or pink MOST form) --  "MOST" Form in Place? --       Prognosis:  Unable to determine  Discharge Planning: To Be Determined  Care plan was discussed with patient and daughter present at  bedside.  Also discussed with bedside RN.  Thank you for allowing the Palliative Medicine Team to assist in the care of this patient.   Total Time 50 Prolonged Time Billed  no   Greater than 50%  of this time was spent counseling and coordinating care related to the above assessment and plan.  Micheline Rough, MD  Please contact Palliative Medicine Team phone at [phone] for questions and concerns.

## 2021-03-03 NOTE — Progress Notes (Signed)
Patient ID: Brian Benton, male   DOB: 06/16/1955, 65 y.o.   MRN: [id number]  PROGRESS NOTE    Brian Benton  XFG:[id number] DOB: 1955-06-15 DOA: 02/25/2021 PCP: [facility]   Brief Narrative:  65 year old male with history of metastatic bladder bladder cancer status post TURBT, hyperlipidemia, hypertension, iron deficiency anemia, thrombocytopenia, tobacco use, 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  Lower extremity Doppler and CT of the abdomen and pelvis with contrast showed persistent complete occlusion of indwelling left iliofemoral venous stent with interval increase of occlusive inferior vena cava thrombus with circumferential bladder wall thickening.  IR, oncology and palliative care were consulted.  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 -Currently on Dilaudid IV Dilaudid as per palliative care.  Also on fentanyl patch and as needed oxycodone.  Continue Cymbalta and Lyrica. -IR planning for superior hypogastric nerve block on 03/04/2021.  History of metastatic bladder cancer -Oncology and palliative care team following -Currently still listed as full code.  Overall prognosis is very poor.  Patient contemplating home hospice as well.  Anemia of chronic disease and iron deficiency -Patient received 2 units of packed red cells so far during the hospital stay.  Hemoglobin 6.5 this morning.  Transfuse 1 more unit of packed red cells.  Monitor H&H  Thrombocytopenia -Monitor.  Leukocytosis -Possibly reactive.  No evidence of any infection.  Monitoring off antibiotics  Constipation -Continue bowel regimen  Essential hypertension -Blood pressure on the lower side.  Monitor  Mild protein  calorie malnutrition -Encourage oral intake  Tobacco use -Nicotine patch as needed  DVT prophylaxis: SCDs Code Status: Full Family Communication: None at bedside Disposition Plan: Status is: Inpatient  Remains inpatient appropriate because:Inpatient level of care appropriate due to severity of illness  Dispo: The patient is from: Home              Anticipated d/c is to: Home              Patient currently is not medically stable to d/c.   Difficult to place patient No  Consultants: Oncology/IR/palliative care  Procedures: None  Antimicrobials: None   Subjective: Patient seen and examined at bedside.  Pain is currently well controlled with current pain medications.  No overnight fever, vomiting, worsening shortness of breath reported.  Objective: Vitals:   03/03/21 1126 03/03/21 1130 03/03/21 1132 03/03/21 1334  BP: (!) 100/50 (!) 100/53 101/75 106/62  Pulse: 85   80  Resp: 18   20  Temp: 98.8 F (37.1 C)   98.3 F (36.8 C)  TempSrc: Oral   Oral  SpO2:    97%  Weight:      Height:        Intake/Output Summary (Last 24 hours) at 03/03/2021 1423 Last data filed at 03/03/2021 1336 Gross per 24 hour  Intake 350.96 ml  Output 300 ml  Net 50.96 ml   Filed Weights   02/25/21 1416 02/25/21 1557  Weight: 77.1 kg 77.1 kg    Examination:  General exam: Appears calm and comfortable.  Looks chronically ill and deconditioned.  Currently on room air. ENT: No elevated JVD Respiratory system: Bilateral decreased breath sounds at bases Cardiovascular system: S1 & S2 heard, Rate controlled Gastrointestinal system: Abdomen is  nondistended, soft and nontender. Normal bowel sounds heard. Extremities: No cyanosis, clubbing, edema  Central nervous system: Awake, slow to respond.  Poor historian. No focal neurological deficits. Moving extremities Psychiatry: Affect is flat.  Does not participate in conversation much.   Data Reviewed: I have personally reviewed following labs  and imaging studies  CBC: Recent Labs  Lab 02/25/21 1639 02/26/21 0515 02/27/21 0222 02/28/21 0437 03/01/21 0434 03/02/21 0515 03/03/21 0849  WBC 16.7*   < > 14.1* 14.1* 13.6* 15.8* 16.4*  NEUTROABS 15.5*  --   --   --   --   --   --   HGB 9.8*   < > 6.7* 8.5* 9.1* 7.6* 6.5*  HCT 30.6*   < > 20.5* 26.2* 28.3* 23.8* 20.8*  MCV 99.0   < > 96.7 98.1 100.4* 100.0 103.0*  PLT 48*   < > 65* 82* 89* 101* 112*   < > = values in this interval not displayed.   Basic Metabolic Panel: Recent Labs  Lab 02/26/21 0515 02/27/21 0222 02/28/21 0437 03/01/21 0434 03/02/21 0515 03/03/21 0849  NA 134* 138 143 141 137  --   K 3.4* 3.8 3.9 4.2 4.1  --   CL 99 104 108 108 107  --   CO2 22 26 27 26 22   --   GLUCOSE 130* 197* 119* 93 150*  --   BUN 30* 30* 31* 31* 29*  --   CREATININE 0.86 0.75 0.53* 0.98 0.94  --   CALCIUM 8.6* 8.4* 8.6* 8.5* 7.9*  --   MG  --  1.9 2.2 1.9 1.8 1.8   GFR: Estimated Creatinine Clearance: 84.3 mL/min (by C-G formula based on SCr of 0.94 mg/dL). Liver Function Tests: Recent Labs  Lab 02/25/21 1639  AST 19  ALT 22  ALKPHOS 68  BILITOT 1.2  PROT 6.2*  ALBUMIN 3.4*   No results for input(s): LIPASE, AMYLASE in the last 168 hours. No results for input(s): AMMONIA in the last 168 hours. Coagulation Profile: Recent Labs  Lab 02/25/21 2255  INR 1.2   Cardiac Enzymes: No results for input(s): CKTOTAL, CKMB, CKMBINDEX, TROPONINI in the last 168 hours. BNP (last 3 results) No results for input(s): PROBNP in the last 8760 hours. HbA1C: No results for input(s): HGBA1C in the last 72 hours. CBG: No results for input(s): GLUCAP in the last 168 hours. Lipid Profile: No results for input(s): CHOL, HDL, LDLCALC, TRIG, CHOLHDL, LDLDIRECT in the last 72 hours. Thyroid Function Tests: No results for input(s): TSH, T4TOTAL, FREET4, T3FREE, THYROIDAB in the last 72 hours. Anemia Panel: Recent Labs    03/03/21 0849  TIBC 182*  IRON 10*   Sepsis  Labs: Recent Labs  Lab 02/26/21 0515  PROCALCITON 0.54    No results found for this or any previous visit (from the past 240 hour(s)).       Radiology Studies: No results found.      Scheduled Meds:  sodium chloride   Intravenous Once   aspirin EC  81 mg Oral Daily   Chlorhexidine Gluconate Cloth  6 each Topical Daily   DULoxetine  60 mg Oral QHS   fentaNYL  1 patch Transdermal Q48H   [START ON 03/05/2021] fondaparinux (ARIXTRA) injection  7.5 mg Subcutaneous Q24H   HYDROmorphone   Intravenous Q4H   pantoprazole  40 mg Oral BID   polyethylene glycol  17 g Oral BID   pregabalin  50 mg Oral TID   sodium chloride flush  10-40 mL Intracatheter Q12H   Continuous Infusions:        Aline August, MD [facility] 03/03/2021, 2:23 PM

## 2021-03-04 ENCOUNTER — Inpatient Hospital Stay (HOSPITAL_COMMUNITY): Payer: 59

## 2021-03-04 DIAGNOSIS — D696 Thrombocytopenia, unspecified: Secondary | ICD-10-CM | POA: Diagnosis not present

## 2021-03-04 DIAGNOSIS — G893 Neoplasm related pain (acute) (chronic): Secondary | ICD-10-CM | POA: Diagnosis not present

## 2021-03-04 DIAGNOSIS — D72829 Elevated white blood cell count, unspecified: Secondary | ICD-10-CM | POA: Diagnosis not present

## 2021-03-04 DIAGNOSIS — K5903 Drug induced constipation: Secondary | ICD-10-CM | POA: Diagnosis not present

## 2021-03-04 DIAGNOSIS — C799 Secondary malignant neoplasm of unspecified site: Secondary | ICD-10-CM | POA: Diagnosis not present

## 2021-03-04 DIAGNOSIS — I82422 Acute embolism and thrombosis of left iliac vein: Secondary | ICD-10-CM | POA: Diagnosis not present

## 2021-03-04 DIAGNOSIS — Z515 Encounter for palliative care: Secondary | ICD-10-CM | POA: Diagnosis not present

## 2021-03-04 HISTORY — PX: IR 3D INDEPENDENT WKST: IMG2385

## 2021-03-04 HISTORY — PX: IR FLUORO GUIDED NEEDLE PLC ASPIRATION/INJECTION LOC: IMG2395

## 2021-03-04 LAB — BPAM RBC
Blood Product Expiration Date: 202210142359
ISSUE DATE / TIME: 202209211048
Unit Type and Rh: 6200

## 2021-03-04 LAB — CBC WITH DIFFERENTIAL/PLATELET
Abs Immature Granulocytes: 0.19 10*3/uL — ABNORMAL HIGH (ref 0.00–0.07)
Basophils Absolute: 0 10*3/uL (ref 0.0–0.1)
Basophils Relative: 0 %
Eosinophils Absolute: 0.2 10*3/uL (ref 0.0–0.5)
Eosinophils Relative: 1 %
HCT: 25 % — ABNORMAL LOW (ref 39.0–52.0)
Hemoglobin: 7.9 g/dL — ABNORMAL LOW (ref 13.0–17.0)
Immature Granulocytes: 1 %
Lymphocytes Relative: 4 %
Lymphs Abs: 0.6 10*3/uL — ABNORMAL LOW (ref 0.7–4.0)
MCH: 31.5 pg (ref 26.0–34.0)
MCHC: 31.6 g/dL (ref 30.0–36.0)
MCV: 99.6 fL (ref 80.0–100.0)
Monocytes Absolute: 0.7 10*3/uL (ref 0.1–1.0)
Monocytes Relative: 4 %
Neutro Abs: 14.1 10*3/uL — ABNORMAL HIGH (ref 1.7–7.7)
Neutrophils Relative %: 90 %
Platelets: 125 10*3/uL — ABNORMAL LOW (ref 150–400)
RBC: 2.51 MIL/uL — ABNORMAL LOW (ref 4.22–5.81)
RDW: 19.2 % — ABNORMAL HIGH (ref 11.5–15.5)
WBC: 15.7 10*3/uL — ABNORMAL HIGH (ref 4.0–10.5)
nRBC: 0 % (ref 0.0–0.2)

## 2021-03-04 LAB — TYPE AND SCREEN
ABO/RH(D): A POS
Antibody Screen: NEGATIVE
Unit division: 0

## 2021-03-04 LAB — COMPREHENSIVE METABOLIC PANEL
ALT: 18 U/L (ref 0–44)
AST: 13 U/L — ABNORMAL LOW (ref 15–41)
Albumin: 2.6 g/dL — ABNORMAL LOW (ref 3.5–5.0)
Alkaline Phosphatase: 69 U/L (ref 38–126)
Anion gap: 5 (ref 5–15)
BUN: 21 mg/dL (ref 8–23)
CO2: 25 mmol/L (ref 22–32)
Calcium: 8.7 mg/dL — ABNORMAL LOW (ref 8.9–10.3)
Chloride: 108 mmol/L (ref 98–111)
Creatinine, Ser: 0.75 mg/dL (ref 0.61–1.24)
GFR, Estimated: >60 mL/min (ref >60)
Glucose, Bld: 128 mg/dL — ABNORMAL HIGH (ref 70–99)
Potassium: 4.2 mmol/L (ref 3.5–5.1)
Sodium: 138 mmol/L (ref 135–145)
Total Bilirubin: 0.8 mg/dL (ref 0.3–1.2)
Total Protein: 5.9 g/dL — ABNORMAL LOW (ref 6.5–8.1)

## 2021-03-04 LAB — PROTIME-INR
INR: 1.1 (ref 0.8–1.2)
Prothrombin Time: 13.8 seconds (ref 11.4–15.2)

## 2021-03-04 LAB — GLUCOSE, CAPILLARY: Glucose-Capillary: 137 mg/dL — ABNORMAL HIGH (ref 70–99)

## 2021-03-04 MED ORDER — BISACODYL 10 MG RE SUPP
10.0000 mg | Freq: Once | RECTAL | Status: AC
  Administered 2021-03-04: 10 mg via RECTAL
  Filled 2021-03-04: qty 1

## 2021-03-04 MED ORDER — BUPIVACAINE HCL (PF) 0.25 % IJ SOLN
20.0000 mL | Freq: Once | INTRAMUSCULAR | Status: AC
  Administered 2021-03-04: 16 mL
  Filled 2021-03-04: qty 20

## 2021-03-04 MED ORDER — EPOETIN ALFA 40000 UNIT/ML IJ SOLN
40000.0000 [IU] | Freq: Once | INTRAMUSCULAR | Status: AC
  Administered 2021-03-04: 40000 [IU] via SUBCUTANEOUS
  Filled 2021-03-04: qty 1

## 2021-03-04 MED ORDER — LORAZEPAM 2 MG/ML IJ SOLN: 1.0000 mg | Freq: Once | INTRAMUSCULAR | Status: DC

## 2021-03-04 MED ORDER — SODIUM CHLORIDE 0.9 % IV SOLN
1500.0000 mg | Freq: Once | INTRAVENOUS | Status: AC
  Administered 2021-03-04: 1500 mg via INTRAVENOUS
  Filled 2021-03-04: qty 30

## 2021-03-04 MED ORDER — LIDOCAINE HCL 1 % IJ SOLN
INTRAMUSCULAR | Status: DC | PRN
  Administered 2021-03-04: 10 mL

## 2021-03-04 MED ORDER — MIDAZOLAM HCL 2 MG/2ML IJ SOLN
INTRAMUSCULAR | Status: AC
  Filled 2021-03-04: qty 2

## 2021-03-04 MED ORDER — LIDOCAINE HCL 1 % IJ SOLN
INTRAMUSCULAR | Status: AC
  Administered 2021-03-04: 10 mL via TRANSDERMAL
  Filled 2021-03-04: qty 20

## 2021-03-04 MED ORDER — MIDAZOLAM HCL 2 MG/2ML IJ SOLN
INTRAMUSCULAR | Status: DC | PRN
  Administered 2021-03-04 – 2021-03-05 (×4): 1 mg via INTRAVENOUS

## 2021-03-04 MED ORDER — FENTANYL CITRATE (PF) 100 MCG/2ML IJ SOLN
INTRAMUSCULAR | Status: DC | PRN
  Administered 2021-03-04 – 2021-03-05 (×4): 50 ug via INTRAVENOUS

## 2021-03-04 MED ORDER — FENTANYL CITRATE (PF) 100 MCG/2ML IJ SOLN
INTRAMUSCULAR | Status: AC
  Filled 2021-03-04: qty 2

## 2021-03-04 MED ORDER — SODIUM CHLORIDE 0.9 % IV SOLN
50.0000 mg | Freq: Once | INTRAVENOUS | Status: AC
  Administered 2021-03-04: 50 mg via INTRAVENOUS
  Filled 2021-03-04: qty 1

## 2021-03-04 MED ORDER — SODIUM CHLORIDE 0.9 % IV SOLN: 1500.0000 mg | Freq: Once | INTRAVENOUS | Status: DC

## 2021-03-04 NOTE — Progress Notes (Signed)
Daily Progress Note   Patient Name: Brian Benton       Date: 03/04/2021 DOB: 11/05/1955  Age: 65 y.o. MRN#: [id number] Attending Physician: Aline August, MD Primary Care Physician: [facility] Date: 02/25/2021  Reason for Consultation/Follow-up: Establishing goals of care, Pain control, and Psychosocial/spiritual support  Subjective: I saw and examined Brian Benton today.  His daughter was present at the bedside.  PCA reviewed and he has used 32mg  of dilaudid in the last 24 hours.  He went down for injection this afternoon and reports that he thinks that his pain does feel improved following it.  He still has pain in his leg, but feels it is diminished and is hopeful that he will be able to be more mobile.   We discussed plan to continue to follow PCA needs overnight tonight to see if needs decrease or if his perception of pain control is improved.  We reviewed constipation and discussed options for bowel regimen.  Made addition of senna in addition to MiraLAX and also added as needed Dulcolax yesterday, but he did not try suppository and still no BM.  His daughter requested PT evaluation as they are planning to take him home with hospice support at time of discharge but she needs to know his functional status to no what type of help he is going to need and what DME he will need at time of discharge.  PT attempted to see today but he wanted to wait until after procedure, which is certainly reasonable.   Questions and concerns addressed.   PMT will continue to support holistically.   Length of Stay: 6  Current Medications: Scheduled Meds:  . sodium chloride   Intravenous Once  . bisacodyl  10 mg Rectal Once  . Chlorhexidine Gluconate Cloth  6 each Topical  Daily  . DULoxetine  60 mg Oral QHS  . fentaNYL  1 patch Transdermal Q48H  . fentaNYL      . HYDROmorphone   Intravenous Q4H  . LORazepam  1 mg Intravenous Once  . midazolam      . pantoprazole  40 mg Oral BID  . polyethylene glycol  17 g Oral BID  . pregabalin  50 mg Oral TID  . senna  2 tablet Oral BID  . sodium chloride  flush  10-40 mL Intracatheter Q12H    Continuous Infusions: . iron dextran (INFED/DEXFERRUM) infusion 1,500 mg (03/04/21 1724)     PRN Meds: acetaminophen **OR** acetaminophen, bisacodyl, diphenhydrAMINE **OR** diphenhydrAMINE, fentaNYL, guaiFENesin, hydrALAZINE, ipratropium-albuterol, lidocaine, LORazepam, metoprolol tartrate, midazolam, naloxone **AND** sodium chloride flush, ondansetron (ZOFRAN) IV, oxyCODONE, prochlorperazine, sodium chloride flush, sodium chloride flush, traZODone  Physical Exam         Resting in bed Appears with mild generalized weakness Has left lower extremity ongoing discomfort Regular work of breathing Appears chronically ill  Vital Signs: BP 112/65 (BP Location: Left Arm)   Pulse 88   Temp 98.2 F (36.8 C) (Oral)   Resp 20   Ht 6' (1.829 m)   Wt 63.6 kg   SpO2 97%   BMI 19.01 kg/m  SpO2: SpO2: 97 % O2 Device: O2 Device: Room Air O2 Flow Rate: O2 Flow Rate (L/min): 0 L/min  Intake/output summary:  Intake/Output Summary (Last 24 hours) at 03/04/2021 1800 Last data filed at 03/04/2021 1500 Gross per 24 hour  Intake 110 ml  Output 600 ml  Net -490 ml    LBM: Last BM Date: 03/03/21 Baseline Weight: Weight: 77.1 kg Most recent weight: Weight: 63.6 kg       Palliative Assessment/Data:      Patient Active Problem List   Diagnosis Date Noted  . Palliative care by specialist   . Goals of care, counseling/discussion   . Left hip pain 02/26/2021  . Deep vein thrombosis (DVT) of left iliofemoral vein ([place]) 02/25/2021  . Mild protein malnutrition ([place]) 02/25/2021  . Thrombocytopenia ([place]) 02/25/2021  . Leukocytosis  02/25/2021  . IDA (iron deficiency anemia) 09/22/2020  . 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 Leg swelling 03/02/2020  . Paraspinal mass 03/02/2020  . Acute deep vein thrombosis (DVT) of left lower extremity ([place]) 03/02/2020  . Essential hypertension 10/31/2018  . 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 Mixed hyperlipidemia 10/03/2018  . NEVUS, ATYPICAL 10/23/2007  . 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  Recommendations/Plan: Pain, cancer related: Continue dilaudid PCA.  Continue basal rate of 0.5 mg/h.  Continue bolus dose to 0.5 mg with 20-minute lockout.  Appreciate IR trial injection and plan to see how pain is doing overnight and tomorrow.  His daughter works for hospice agency and we discussed various options for pain management but these will greatly depend upon his response to trial of injection.  He will need both long and short acting medications at time of discharge.  We have been discussing potential for PCA as this may be the best way to control his pain once he transitions home.  For long-acting agent, we have discussed potential options of continuing fentanyl patch, discontinuation of fentanyl and increasing basal rate of Dilaudid via PCA, or addition of methadone for long-acting agent.  As noted, I will plan to reach out to hosp[facility] of [name] once we have a better idea what his overall needs will be based upon trial injection by IR to further discuss which of these options would be best  in line with their continued  plan for managing his pain long-term. Constipation, opioid related.  Continue MiraLAX.  Add senna 2 tabs twice daily.  Add Dulcolax suppository scheduled this evening. Eventual goal is to transition home with hospice. He is in the process of discussing acute considering CODE STATUS with his wife.  I did not push this conversation again today.  Goals of Care and Additional Recommendations: Limitations on Scope of Treatment: Currently full code, ongoing discussions about DNR DNI and home with hospice on appropriate symptom management.  Palliative medicine team following closely  Code Status:    Code Status Orders  (From admission, onward)           Start     Ordered   02/25/21 2215  Full code  Continuous        02/25/21 2244           Code Status History     Date Active Date Inactive Code Status Order ID Comments User Context   03/13/2020 1019 03/15/2020 0515 Full Code [id number]  Elam Dutch, MD Inpatient   03/02/2020 2020 03/06/2020 1955 Full Code [id number]  Chotiner, Yevonne Aline, MD Inpatient      Advance Directive Documentation    Flowsheet Row Most Recent Value  Type of Advance Directive Healthcare Power of Attorney, Living will  Pre-existing out of facility DNR order (yellow form or pink MOST form) --  "MOST" Form in Place? --       Prognosis:  Unable to determine  Discharge Planning: To Be Determined  Care plan was discussed with patient and daughter present at bedside.  Also discussed with bedside RN.  Thank you for allowing the Palliative Medicine Team to assist in the care of this patient.   Total Time 45 Prolonged Time Billed  no   Greater than 50%  of this time was spent counseling and coordinating care related to the above assessment and plan.  Micheline Rough, MD  Please contact Palliative Medicine Team phone at [phone] for questions and concerns.

## 2021-03-04 NOTE — Progress Notes (Signed)
Patient ID: RAJAT STAVER, male   DOB: August 13, 1955, 65 y.o.   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  Lower extremity Doppler and CT of the abdomen and pelvis with contrast showed persistent complete occlusion of indwelling left iliofemoral venous stent with interval increase of occlusive inferior vena cava thrombus with circumferential bladder wall thickening.  IR, oncology and palliative care were consulted.  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 -Currently on Dilaudid IV Dilaudid as per palliative care.  Also on fentanyl patch and as needed oxycodone.  Continue Cymbalta and Lyrica. -IR planning for superior hypogastric nerve block today. -Patient has developed large right-sided ecchymosis.  Arixtra stopped by oncology today.  I will stop aspirin as well.  History of metastatic bladder cancer -Oncology and palliative care team following -Currently still listed as full code.  Overall prognosis is very poor.  Patient contemplating home hospice as well.  Anemia of chronic disease and iron deficiency -Patient received 3 units of packed red cells so far during the hospital stay, last 1 being on 03/03/2021 for hemoglobin of 6.5.  Hemoglobin 7.9 this morning.  Monitor H&H  Thrombocytopenia -Monitor.  Leukocytosis -Possibly reactive.  No evidence of any infection.  Monitoring off  antibiotics  Constipation -Continue bowel regimen  Essential hypertension -Blood pressure on the lower side.  Monitor  Mild protein calorie malnutrition -Encourage oral intake  Tobacco use -Nicotine patch as needed  DVT prophylaxis: SCDs Code Status: Full Family Communication: Wife at bedside Disposition Plan: Status is: Inpatient  Remains inpatient appropriate because:Inpatient level of care appropriate due to severity of illness  Dispo: The patient is from: Home              Anticipated d/c is to: Home              Patient currently is not medically stable to d/c.   Difficult to place patient No  Consultants: Oncology/IR/palliative care  Procedures: None  Antimicrobials: None   Subjective: Patient seen and examined at bedside.  No worsening shortness of breath, fever or vomiting reported.  Still complains of intermittent pain. Objective: Vitals:   03/04/21 0301 03/04/21 0430 03/04/21 0656 03/04/21 0726  BP: 116/67  132/83   Pulse: 89  92   Resp: 20 16  17   Temp: 98.2 F (36.8 C)     TempSrc: Oral     SpO2: 95% 96% 97% 95%  Weight:      Height:        Intake/Output Summary (Last 24 hours) at 03/04/2021 0759 Last data filed at 03/04/2021 0656 Gross per 24 hour  Intake 627.29 ml  Output 600 ml  Net 27.29 ml    Filed Weights   02/25/21 1416 02/25/21 1557  Weight: 77.1 kg 77.1 kg    Examination:  General exam: No distress.  On room air currently.  Looks chronically ill and deconditioned.   ENT: JVD is not elevated.   Respiratory system: Decreased breath sounds at bases bilaterally  with some scattered crackles  cardiovascular system: Rate controlled, S1-S2 heard  gastrointestinal system: Abdomen is slightly distended, soft and nontender.  Bowel sounds are heard Extremities: No edema or clubbing  Central nervous system: Still slow to respond, alert.  Poor historian. No focal neurological deficits.  Moves extremities Skin: Large right-sided ecchymosis  present from almost below the axilla to almost the right hip Psychiatry: Affect is mostly flat   Data Reviewed: I have personally reviewed following labs and imaging studies  CBC: Recent Labs  Lab 02/25/21 1639 02/26/21 0515 02/28/21 0437 03/01/21 0434 03/02/21 0515 03/03/21 0849 03/03/21 1631 03/04/21 0401  WBC 16.7*   < > 14.1* 13.6* 15.8* 16.4*  --  15.7*  NEUTROABS 15.5*  --   --   --   --   --   --  14.1*  HGB 9.8*   < > 8.5* 9.1* 7.6* 6.5* 7.4* 7.9*  HCT 30.6*   < > 26.2* 28.3* 23.8* 20.8* 23.6* 25.0*  MCV 99.0   < > 98.1 100.4* 100.0 103.0*  --  99.6  PLT 48*   < > 82* 89* 101* 112*  --  125*   < > = values in this interval not displayed.    Basic Metabolic Panel: Recent Labs  Lab 02/27/21 0222 02/28/21 0437 03/01/21 0434 03/02/21 0515 03/03/21 0849 03/04/21 0401  NA 138 143 141 137  --  138  K 3.8 3.9 4.2 4.1  --  4.2  CL 104 108 108 107  --  108  CO2 26 27 26 22   --  25  GLUCOSE 197* 119* 93 150*  --  128*  BUN 30* 31* 31* 29*  --  21  CREATININE 0.75 0.53* 0.98 0.94  --  0.75  CALCIUM 8.4* 8.6* 8.5* 7.9*  --  8.7*  MG 1.9 2.2 1.9 1.8 1.8  --     GFR: Estimated Creatinine Clearance: 99.1 mL/min (by C-G formula based on SCr of 0.75 mg/dL). Liver Function Tests: Recent Labs  Lab 02/25/21 1639 03/04/21 0401  AST 19 13*  ALT 22 18  ALKPHOS 68 69  BILITOT 1.2 0.8  PROT 6.2* 5.9*  ALBUMIN 3.4* 2.6*    No results for input(s): LIPASE, AMYLASE in the last 168 hours. No results for input(s): AMMONIA in the last 168 hours. Coagulation Profile: Recent Labs  Lab 02/25/21 2255 03/04/21 0401  INR 1.2 1.1    Cardiac Enzymes: No results for input(s): CKTOTAL, CKMB, CKMBINDEX, TROPONINI in the last 168 hours. BNP (last 3 results) No results for input(s): PROBNP in the last 8760 hours. HbA1C: No results for input(s): HGBA1C in the last 72 hours. CBG: No results for input(s): GLUCAP in the last 168 hours. Lipid Profile: No results for input(s):  CHOL, HDL, LDLCALC, TRIG, CHOLHDL, LDLDIRECT in the last 72 hours. Thyroid Function Tests: No results for input(s): TSH, T4TOTAL, FREET4, T3FREE, THYROIDAB in the last 72 hours. Anemia Panel: Recent Labs    03/03/21 0849  TIBC 182*  IRON 10*    Sepsis Labs: Recent Labs  Lab 02/26/21 0515  PROCALCITON 0.54     No results found for this or any previous visit (from the past 240 hour(s)).       Radiology Studies: No results found.      Scheduled Meds:  sodium chloride   Intravenous Once   aspirin EC  81 mg Oral Daily   Chlorhexidine Gluconate Cloth  6 each Topical Daily   DULoxetine  60 mg Oral  QHS   epoetin alfa  40,000 Units Subcutaneous Once   fentaNYL  1 patch Transdermal Q48H   HYDROmorphone   Intravenous Q4H   LORazepam  1 mg Intravenous Once   pantoprazole  40 mg Oral BID   polyethylene glycol  17 g Oral BID   pregabalin  50 mg Oral TID   senna  2 tablet Oral BID   sodium chloride flush  10-40 mL Intracatheter Q12H   Continuous Infusions:  iron dextran (INFED/DEXFERRUM) infusion            Aline August, MD [facility] 03/04/2021, 7:59 AM

## 2021-03-04 NOTE — Plan of Care (Signed)
  Problem: Clinical Measurements: Goal: Will remain free from infection Outcome: Progressing   Problem: Activity: Goal: Risk for activity intolerance will decrease Outcome: Progressing   Problem: Nutrition: Goal: Adequate nutrition will be maintained Outcome: Progressing   Problem: Elimination: Goal: Will not experience complications related to bowel motility Outcome: Progressing   Problem: Pain Managment: Goal: General experience of comfort will improve Outcome: Progressing   Problem: Safety: Goal: Ability to remain free from injury will improve Outcome: Progressing   Problem: Skin Integrity: Goal: Risk for impaired skin integrity will decrease Outcome: Progressing

## 2021-03-04 NOTE — Consult Note (Addendum)
[place] Nurse Consult Note: Reason for Consult: Consult requested for right buttock wound.  Pt is in pain and states he is unable to turn off the affected area. It was very difficult, time-consuming, and painful for him to be able to reposition in order to view the affected location. Wife at bedside assessed the wound appearance and discussed plan of care.  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  It will be difficult to promote healing if patient is unable to reduce pressure over the affected area.  He has multiple systemic factors which can impair healing.  Discussed with wife if he begins to take in less nutrition and fluids related to his overall diagnosis, wound will probably decline to slough or eschar. Topical treatment orders provided for bedside nurses to perform as follows to protect from further injury: Foam dressing to right buttock wound, change Q 3 days or PRN soiling. Please re-consult if further assistance is needed.  Thank-you,  Julien Girt MSN, [place], [place], [place], [place]

## 2021-03-04 NOTE — Progress Notes (Signed)
Hopefully, he will be able to get some good relief with neurolysis today.  Interventional Radiology will work on him today.  He did get blood yesterday.  His hemoglobin was 6.5.  He has this large ecchymoses on his right side.  This extends from the axilla down to his hip.  This might be why he was losing blood.  I think we have to get him off blood thinner.  I think that his thrombocytopenia may have been a factor.  His platelet count is come up.  His white cell count is 15.7.  Hemoglobin 7.9.  Platelet count 125,000.  BUN is 21 creatinine 0.75.  I did check a prealbumin on him yesterday.  It was 12.3.  He has had no problems with bowels or bladder.  There is no diarrhea.  He has had no cough.  He still is on the PCA Dilaudid.  Is vital signs are temperature 98.2.  Pulse 92.  Blood pressure 132/83.  His lungs sound pretty clear bilaterally.  He has good air movement bilaterally.  Cardiac exam regular rate and rhythm.  Abdomen is soft.  There is no fluid wave.  There is no guarding or rebound tenderness.  Extremities shows no clubbing, cyanosis or edema.  He has some muscle atrophy in upper and lower extremities.  Skin exam does show the large ecchymoses on his right side.  Neurological exam is nonfocal.  Again, our goal is quality of life and pain control.  Hopefully, the skill of Interventional Radiology will come through and he will be able to have a neurolysis that can help with his pain.  Again there is large ecchymoses mostly why he is dropped his hemoglobin.  I will stop the Arixtra.  He is iron deficient.  I will give him some IV iron.  I will also think about some Procrit for him.  I do appreciate the great care that he is getting.   Lattie Haw, MD  [facility] 6:2

## 2021-03-04 NOTE — Progress Notes (Signed)
79ml of dilaudid pca syringe wasted with Sonne RN witness.

## 2021-03-04 NOTE — Progress Notes (Signed)
PT Cancellation Note  Patient Details Name: Brian Benton MRN: [id number] DOB: 1956/04/05   Cancelled Treatment:    Reason Eval/Treat Not Completed: Patient at procedure or test/unavailable;Pain limiting ability to participate (pt is leaving soon for an IR procedure, he doesn't want to attempt mobility at present as it will flare up his pain. Pt politely requested PT attempt tomorrow. Will follow.)   Philomena Doheny PT 03/04/2021  Acute Rehabilitation Services Pager [phone] Office [phone]

## 2021-03-04 NOTE — Procedures (Signed)
Interventional Radiology Procedure Note  Procedure:   Image guided superior hypogastric nerve block, for test preceding possible neurolysis.    ~16cc of 0.25% bupivacaine was delivered to the superior hypogastric nerve plexus, anterior to L5.    Complications: None  Recommendations:  - Local wound care - If he experiences any significant decrease in his discomfort, we can discuss offering a neurolysis for more durable relief. - Do not submerge for 7 days    Signed,  Dulcy Fanny. Earleen Newport, DO

## 2021-03-05 ENCOUNTER — Encounter: Payer: Self-pay | Admitting: *Deleted

## 2021-03-05 ENCOUNTER — Inpatient Hospital Stay (HOSPITAL_COMMUNITY): Payer: 59

## 2021-03-05 ENCOUNTER — Ambulatory Visit: Payer: 59 | Admitting: Rehabilitation

## 2021-03-05 DIAGNOSIS — K5903 Drug induced constipation: Secondary | ICD-10-CM | POA: Diagnosis not present

## 2021-03-05 DIAGNOSIS — C679 Malignant neoplasm of bladder, unspecified: Secondary | ICD-10-CM | POA: Diagnosis not present

## 2021-03-05 DIAGNOSIS — G893 Neoplasm related pain (acute) (chronic): Secondary | ICD-10-CM | POA: Diagnosis not present

## 2021-03-05 DIAGNOSIS — C799 Secondary malignant neoplasm of unspecified site: Secondary | ICD-10-CM | POA: Diagnosis not present

## 2021-03-05 DIAGNOSIS — L8990 Pressure ulcer of unspecified site, unspecified stage: Secondary | ICD-10-CM | POA: Insufficient documentation

## 2021-03-05 DIAGNOSIS — I82422 Acute embolism and thrombosis of left iliac vein: Secondary | ICD-10-CM | POA: Diagnosis not present

## 2021-03-05 DIAGNOSIS — Z515 Encounter for palliative care: Secondary | ICD-10-CM | POA: Diagnosis not present

## 2021-03-05 LAB — COMPREHENSIVE METABOLIC PANEL
ALT: 17 U/L (ref 0–44)
AST: 11 U/L — ABNORMAL LOW (ref 15–41)
Albumin: 2.5 g/dL — ABNORMAL LOW (ref 3.5–5.0)
Alkaline Phosphatase: 67 U/L (ref 38–126)
Anion gap: 11 (ref 5–15)
BUN: 20 mg/dL (ref 8–23)
CO2: 23 mmol/L (ref 22–32)
Calcium: 8.7 mg/dL — ABNORMAL LOW (ref 8.9–10.3)
Chloride: 101 mmol/L (ref 98–111)
Creatinine, Ser: 0.76 mg/dL (ref 0.61–1.24)
GFR, Estimated: >60 mL/min (ref >60)
Glucose, Bld: 125 mg/dL — ABNORMAL HIGH (ref 70–99)
Potassium: 3.7 mmol/L (ref 3.5–5.1)
Sodium: 135 mmol/L (ref 135–145)
Total Bilirubin: 1.2 mg/dL (ref 0.3–1.2)
Total Protein: 5.6 g/dL — ABNORMAL LOW (ref 6.5–8.1)

## 2021-03-05 LAB — CBC WITH DIFFERENTIAL/PLATELET
Abs Immature Granulocytes: 0.16 10*3/uL — ABNORMAL HIGH (ref 0.00–0.07)
Basophils Absolute: 0 10*3/uL (ref 0.0–0.1)
Basophils Relative: 0 %
Eosinophils Absolute: 0.2 10*3/uL (ref 0.0–0.5)
Eosinophils Relative: 1 %
HCT: 24.2 % — ABNORMAL LOW (ref 39.0–52.0)
Hemoglobin: 7.7 g/dL — ABNORMAL LOW (ref 13.0–17.0)
Immature Granulocytes: 1 %
Lymphocytes Relative: 4 %
Lymphs Abs: 0.5 10*3/uL — ABNORMAL LOW (ref 0.7–4.0)
MCH: 31.7 pg (ref 26.0–34.0)
MCHC: 31.8 g/dL (ref 30.0–36.0)
MCV: 99.6 fL (ref 80.0–100.0)
Monocytes Absolute: 0.4 10*3/uL (ref 0.1–1.0)
Monocytes Relative: 3 %
Neutro Abs: 11.9 10*3/uL — ABNORMAL HIGH (ref 1.7–7.7)
Neutrophils Relative %: 91 %
Platelets: 158 10*3/uL (ref 150–400)
RBC: 2.43 MIL/uL — ABNORMAL LOW (ref 4.22–5.81)
RDW: 18.2 % — ABNORMAL HIGH (ref 11.5–15.5)
WBC: 13.2 10*3/uL — ABNORMAL HIGH (ref 4.0–10.5)
nRBC: 0 % (ref 0.0–0.2)

## 2021-03-05 LAB — MAGNESIUM: Magnesium: 2.1 mg/dL (ref 1.7–2.4)

## 2021-03-05 MED ORDER — MIDAZOLAM HCL 2 MG/2ML IJ SOLN
INTRAMUSCULAR | Status: AC
  Filled 2021-03-05: qty 4

## 2021-03-05 MED ORDER — BUPIVACAINE HCL (PF) 0.25 % IJ SOLN
20.0000 mL | Freq: Once | INTRAMUSCULAR | Status: AC
  Administered 2021-03-05: 20 mL
  Filled 2021-03-05: qty 20

## 2021-03-05 MED ORDER — FENTANYL CITRATE (PF) 100 MCG/2ML IJ SOLN
INTRAMUSCULAR | Status: AC
  Filled 2021-03-05: qty 2

## 2021-03-05 MED ORDER — ALCOHOL 98 % IJ SOLN: 30.0000 mL | Freq: Once | INTRAMUSCULAR | Status: DC

## 2021-03-05 MED ORDER — METHYLPREDNISOLONE ACETATE 40 MG/ML IJ SUSP
40.0000 mg | Freq: Once | INTRAMUSCULAR | Status: AC
  Administered 2021-03-05: 40 mg via INTRALESIONAL
  Filled 2021-03-05: qty 1

## 2021-03-05 MED ORDER — ALCOHOL 98 % IJ SOLN
40.0000 mL | Freq: Once | INTRAMUSCULAR | Status: AC
  Administered 2021-03-05: 20 mL
  Filled 2021-03-05: qty 40

## 2021-03-05 NOTE — Progress Notes (Signed)
Patient ID: Brian Benton, male   DOB: January 05, 1956, 65 y.o.   MRN: [id number]    Referring Physician(s): Ennever,P  Supervising Physician: Corrie Mckusick  Patient Status:  [facility] - In-pt  Chief Complaint: Left hip pain   Subjective: Patient status post image guided superior hypogastric nerve block yesterday; reports decreased left hip pain; no new complaints.Family in room.    Allergies: Patient has no known allergies.  Medications: Prior to Admission medications   Medication Sig Start Date End Date Taking? Authorizing Provider  acetaminophen (TYLENOL) 500 MG tablet Take 500 mg by mouth every 6 (six) hours as needed for moderate pain.   Yes [provider]  apixaban (ELIQUIS) 5 MG TABS tablet TAKE 1 TABLET (5 MG TOTAL) BY MOUTH 2 (TWO) TIMES DAILY. 02/02/21 02/02/22 Yes Ennever, Rudell Cobb, MD  ASPIRIN LOW DOSE 81 MG EC tablet TAKE 1 TABLET BY MOUTH EVERY DAY 10/12/20  Yes Patwardhan, Manish J, MD  DULoxetine (CYMBALTA) 30 MG capsule Take 30-60 mg by mouth as directed. Take 1 capsule (30 mg) at bedtime for 7 Days Then Take 2 capsules (60 mg) at bedtime   Yes [provider]  fentaNYL (DURAGESIC) 100 MCG/HR Place 1 patch onto the skin every other day. 02/10/21  Yes Ennever, Rudell Cobb, MD  lactulose, encephalopathy, (CHRONULAC) 10 GM/15ML SOLN TAKE 30MLS BY MOUTH EVERY 4 HOURS AS NEEDED Patient taking differently: Take 30 g by mouth every 4 (four) hours as needed. 06/26/20 06/26/21 Yes Ennever, Rudell Cobb, MD  lidocaine-prilocaine (EMLA) cream Apply to affected area once Patient taking differently: Apply 1 application topically as directed. Access port 01/13/21  Yes Ennever, Rudell Cobb, MD  Multiple Vitamins-Minerals (MULTIVITAMIN WITH MINERALS) tablet Take 1 tablet by mouth every other day.   Yes [provider]  naloxone (NARCAN) nasal spray 4 mg/0.1 mL Place 1 spray into the nose once. 02/22/21  Yes [provider]  oxyCODONE (OXYCONTIN) 10 mg 12 hr tablet Take 1 tablet  (10 mg total) by mouth every 4 (four) hours as needed. 01/21/21  Yes Ennever, Rudell Cobb, MD  Oxycodone HCl 10 MG TABS TAKE 1 TABLET BY MOUTH EVERY 4 HOURS AS NEEDED Patient taking differently: Take 10-20 mg by mouth every 4 (four) hours as needed (pain). 02/22/21 08/21/21 Yes Ennever, Rudell Cobb, MD  pantoprazole (PROTONIX) 40 MG tablet Take 1 tablet (40 mg total) by mouth 2 (two) times daily. 01/13/21 01/13/22 Yes Ennever, Rudell Cobb, MD  polyethylene glycol (MIRALAX / GLYCOLAX) 17 g packet Take 17 g by mouth daily.   Yes [provider]  pregabalin (LYRICA) 50 MG capsule Take 50 mg by mouth as directed. Take 1 capsule (50 mg) at bedtime for 3 nights, Take 1 capsule (50 mg) BID for 3 Days, Then Take 1 capsule (50 mg) TID 02/22/21  Yes [provider]  sodium chloride (OCEAN) 0.65 % SOLN nasal spray Place 1 spray into both nostrils daily as needed for congestion (Allergies).   Yes [provider]  traMADol (ULTRAM) 50 MG tablet TAKE 2 TABLETS BY MOUTH EVERY 6 HOURS AS NEEDED. Patient taking differently: Take 100 mg by mouth every 6 (six) hours as needed for moderate pain. 02/08/21  Yes Ennever, Rudell Cobb, MD  dexamethasone (DECADRON) 4 MG tablet Take 2 tablets (8 mg total) by mouth daily. Start the day after chemotherapy for 2 days. Patient not taking: No sig reported 01/13/21   Volanda Napoleon, MD  dexamethasone (DECADRON) 6 MG tablet Take 3 tablets (18 mg  total) by mouth daily. **You MUST take with food in the morning.** Patient not taking: No sig reported 01/13/21   Volanda Napoleon, MD  gabapentin (NEURONTIN) 600 MG tablet TAKE 1-2 TABLETS (600 MG TOTAL) BY MOUTH IN THE MORNING, AT NOON, IN THE EVENING,AND AT BEDTIME Patient not taking: No sig reported 12/23/20   Volanda Napoleon, MD  gabapentin (NEURONTIN) 600 MG tablet Take 2 tablets (1,200 mg total) by mouth in the morning, at noon, in the evening, and at bedtime. Patient not taking: No sig reported 01/05/21 01/05/22  Volanda Napoleon, MD   HYDROmorphone (DILAUDID) 8 MG tablet Take 1 tablet (8 mg total) by mouth every 6 (six) hours as needed for severe pain. Patient not taking: No sig reported 01/13/21   Volanda Napoleon, MD  LORazepam (ATIVAN) 0.5 MG tablet Take 1 tablet (0.5 mg total) by mouth every 6 (six) hours as needed (Nausea or vomiting). Patient not taking: No sig reported 01/13/21   Volanda Napoleon, MD  LORazepam (ATIVAN) 0.5 MG tablet Take 2 tablets (1 mg total) by mouth every 6 (six) hours as needed for anxiety. Patient not taking: No sig reported 02/16/21 08/15/21  Volanda Napoleon, MD  meloxicam (MOBIC) 15 MG tablet TAKE 1 TABLET (15 MG TOTAL) BY MOUTH DAILY. Patient not taking: No sig reported 02/22/21 02/22/22  Volanda Napoleon, MD  ondansetron (ZOFRAN) 8 MG tablet Take 1 tablet (8 mg total) by mouth 2 (two) times daily as needed for refractory nausea / vomiting. Start on day 3 after chemo. 01/13/21   Volanda Napoleon, MD  ondansetron (ZOFRAN-ODT) 8 MG disintegrating tablet TAKE 1 TABLET BY MOUTH EVERY 8 HOURS AS NEEDED FOR NAUSEA & VOMITING 09/14/20 09/14/21  Volanda Napoleon, MD  orphenadrine (NORFLEX) 100 MG tablet TAKE 1 TABLET (100 MG TOTAL) BY MOUTH 2 (TWO) TIMES DAILY. Patient not taking: Reported on 02/25/2021 11/03/20   Volanda Napoleon, MD  prochlorperazine (COMPAZINE) 10 MG tablet TAKE 1 TABLET (10 MG TOTAL) BY MOUTH EVERY 6 (SIX) HOURS AS NEEDED FOR NAUSEA OR VOMITING. Patient not taking: No sig reported 09/18/20 09/18/21  Volanda Napoleon, MD  prochlorperazine (COMPAZINE) 10 MG tablet Take 1 tablet (10 mg total) by mouth every 6 (six) hours as needed (Nausea or vomiting). Patient not taking: No sig reported 01/13/21   Volanda Napoleon, MD  temazepam (RESTORIL) 15 MG capsule Take 1 capsule (15 mg total) by mouth at bedtime as needed for sleep. Patient not taking: Reported on 02/25/2021 02/02/21   Volanda Napoleon, MD  enoxaparin (LOVENOX) 80 MG/0.8ML injection Inject 0.8 mLs (80 mg total) into the skin every 12 (twelve) hours.  05/20/20 07/29/20  Volanda Napoleon, MD     Vital Signs: BP 119/72 (BP Location: Left Arm)   Pulse 81   Temp 98.2 F (36.8 C)   Resp 19   Ht 6' (1.829 m)   Wt 140 lb 3.2 oz (63.6 kg)   SpO2 100%   BMI 19.01 kg/m   Physical Examawake/alert; chest- CTA bilat; rt chest port ok; heart- RRR; abd- soft,+BS,NT; no sig pretibial edema; puncture sites lower abdomen region clean and dry, no signs of hematoma.  Imaging: IR 3D Independent Darreld Mclean  Result Date: 03/05/2021 INDICATION: 65 year old male with intractable pelvic pain, progressive bladder cancer EXAM: IMAGE GUIDED REGIONAL NERVE BLOCK WITH NEEDLE PLACEMENT INTO THE SUPERIOR HYPOGASTRIC NERVE PLEXUS MEDICATIONS: None. ANESTHESIA/SEDATION: Moderate (conscious) sedation was employed during this procedure. A total of Versed 2.0 mg  and Fentanyl 100 mcg was administered intravenously. Moderate Sedation Time: 22 minutes. The patient's level of consciousness and vital signs were monitored continuously by radiology nursing throughout the procedure under my direct supervision. FLUOROSCOPY TIME:  Fluoroscopy Time: 2 minutes 47 seconds (247 mGy). COMPLICATIONS: NONE PROCEDURE: Informed written consent was obtained from the patient after a thorough discussion of the procedural risks, benefits and alternatives. All questions were addressed. Maximal Sterile Barrier Technique was utilized including caps, mask, sterile gowns, sterile gloves, sterile drape, hand hygiene and skin antiseptic. A timeout was performed prior to the initiation of the procedure. Patient was positioned supine position on the table in interventional radiology. Patient was made as comfortable as possible, although he had some difficulty with straightening bilateral hips given his intractable pain. A flat panel CT was performed for planning purposes. The patient was then prepped and draped in the usual sterile fashion. 1% lidocaine was used for local anesthesia. Using a combination proprietary  General electric software as well as fluoroscopy, 2 separate 21 gauge needle was placed from an anterior approach into the prevertebral space at the L5-S1 level. Small test injection with contrast was performed with additional flat panel CT performed to confirm location. Superior hypogastric nerve block was then performed with the administration of 8 cc of 0.25% bupivacaine through each needle for a total of 16 cc. Needle was removed. Sterile bandages were placed. Patient tolerated the procedure well and remained hemodynamically stable throughout. No complications were encountered and no significant blood loss. IMPRESSION: Status post image guided superior hypogastric nerve block. Signed, Dulcy Fanny. Dellia Nims, RPVI [facility] Radiology Electronically Signed   By: Corrie Mckusick D.O.   On: 03/05/2021 08:50   IR 3D Independent Darreld Mclean  Result Date: 03/05/2021 INDICATION: 65 year old male with intractable pelvic pain, progressive bladder cancer EXAM: IMAGE GUIDED REGIONAL NERVE BLOCK WITH NEEDLE PLACEMENT INTO THE SUPERIOR HYPOGASTRIC NERVE PLEXUS MEDICATIONS: None. ANESTHESIA/SEDATION: Moderate (conscious) sedation was employed during this procedure. A total of Versed 2.0 mg and Fentanyl 100 mcg was administered intravenously. Moderate Sedation Time: 22 minutes. The patient's level of consciousness and vital signs were monitored continuously by radiology nursing throughout the procedure under my direct supervision. FLUOROSCOPY TIME:  Fluoroscopy Time: 2 minutes 47 seconds (247 mGy). COMPLICATIONS: NONE PROCEDURE: Informed written consent was obtained from the patient after a thorough discussion of the procedural risks, benefits and alternatives. All questions were addressed. Maximal Sterile Barrier Technique was utilized including caps, mask, sterile gowns, sterile gloves, sterile drape, hand hygiene and skin antiseptic. A timeout was performed prior to the initiation  of the procedure. Patient was positioned supine position on the table in interventional radiology. Patient was made as comfortable as possible, although he had some difficulty with straightening bilateral hips given his intractable pain. A flat panel CT was performed for planning purposes. The patient was then prepped and draped in the usual sterile fashion. 1% lidocaine was used for local anesthesia. Using a combination proprietary General electric software as well as fluoroscopy, 2 separate 21 gauge needle was placed from an anterior approach into the prevertebral space at the L5-S1 level. Small test injection with contrast was performed with additional flat panel CT performed to confirm location. Superior hypogastric nerve block was then performed with the administration of 8 cc of 0.25% bupivacaine through each needle for a total of 16 cc. Needle was removed. Sterile bandages were placed. Patient tolerated the procedure well and remained hemodynamically stable throughout. No complications were encountered and  no significant blood loss. IMPRESSION: Status post image guided superior hypogastric nerve block. Signed, Dulcy Fanny. Dellia Nims, RPVI [facility] Radiology Electronically Signed   By: Corrie Mckusick D.O.   On: 03/05/2021 08:50   IR Fluoro Guide Ndl Plmt / BX  Result Date: 03/05/2021 INDICATION: 65 year old male with intractable pelvic pain, progressive bladder cancer EXAM: IMAGE GUIDED REGIONAL NERVE BLOCK WITH NEEDLE PLACEMENT INTO THE SUPERIOR HYPOGASTRIC NERVE PLEXUS MEDICATIONS: None. ANESTHESIA/SEDATION: Moderate (conscious) sedation was employed during this procedure. A total of Versed 2.0 mg and Fentanyl 100 mcg was administered intravenously. Moderate Sedation Time: 22 minutes. The patient's level of consciousness and vital signs were monitored continuously by radiology nursing throughout the procedure under my direct supervision. FLUOROSCOPY TIME:   Fluoroscopy Time: 2 minutes 47 seconds (247 mGy). COMPLICATIONS: NONE PROCEDURE: Informed written consent was obtained from the patient after a thorough discussion of the procedural risks, benefits and alternatives. All questions were addressed. Maximal Sterile Barrier Technique was utilized including caps, mask, sterile gowns, sterile gloves, sterile drape, hand hygiene and skin antiseptic. A timeout was performed prior to the initiation of the procedure. Patient was positioned supine position on the table in interventional radiology. Patient was made as comfortable as possible, although he had some difficulty with straightening bilateral hips given his intractable pain. A flat panel CT was performed for planning purposes. The patient was then prepped and draped in the usual sterile fashion. 1% lidocaine was used for local anesthesia. Using a combination proprietary General electric software as well as fluoroscopy, 2 separate 21 gauge needle was placed from an anterior approach into the prevertebral space at the L5-S1 level. Small test injection with contrast was performed with additional flat panel CT performed to confirm location. Superior hypogastric nerve block was then performed with the administration of 8 cc of 0.25% bupivacaine through each needle for a total of 16 cc. Needle was removed. Sterile bandages were placed. Patient tolerated the procedure well and remained hemodynamically stable throughout. No complications were encountered and no significant blood loss. IMPRESSION: Status post image guided superior hypogastric nerve block. Signed, Dulcy Fanny. Dellia Nims, RPVI [facility] Radiology Electronically Signed   By: Corrie Mckusick D.O.   On: 03/05/2021 08:50    Labs:  CBC: Recent Labs    03/02/21 0515 03/03/21 0849 03/03/21 1631 03/04/21 0401 03/05/21 0450  WBC 15.8* 16.4*  --  15.7* 13.2*  HGB 7.6* 6.5* 7.4* 7.9* 7.7*  HCT 23.8* 20.8* 23.6*  25.0* 24.2*  PLT 101* 112*  --  125* 158    COAGS: Recent Labs    06/08/20 0725 06/17/20 0730 02/25/21 2255 02/26/21 0656 02/27/21 0222 02/27/21 0841 02/28/21 0437 02/28/21 1419 03/04/21 0401  INR 0.9 0.9 1.2  --   --   --   --   --  1.1  APTT  --   --  40*   < > 98* 107* 112* 84*  --    < > = values in this interval not displayed.    BMP: Recent Labs    03/11/20 0813 03/18/20 0858 03/01/21 0434 03/02/21 0515 03/04/21 0401 03/05/21 0450  NA 134*   < > 141 137 138 135  K 4.8   < > 4.2 4.1 4.2 3.7  CL 98   < > 108 107 108 101  CO2 32   < > _0 GLUCOSE 94   < > 93 150* 128* 125*  BUN  14   < > 31* 29* 21 20  CALCIUM 10.2   < > 8.5* 7.9* 8.7* 8.7*  CREATININE 0.72   < > 0.98 0.94 0.75 0.76  GFRNONAA >60   < > >60 >60 >60 >60  GFRAA >60  --   --   --   --   --    < > = values in this interval not displayed.    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  Status post image guided superior hypogastric nerve block yesterday with improvement in pain.  Request now received for superior hypogastric neurolysis.  Case discussed again with Dr. Earleen Newport.  Details/risks of procedure, including but not limited to, internal bleeding, infection, injury to adjacent structures, inability to eradicate pain for longer duration discussed with patient and family with their understanding and consent.   Procedure scheduled for later this afternoon.  Electronically Signed: D. Rowe Robert, PA-C 03/05/2021, 1:13 PM   I spent a total of 20 minutes at the the patient's bedside AND on the patient's hospital floor or unit, greater than 50% of which was counseling/coordinating care for image guided superior hypogastric neurolysis

## 2021-03-05 NOTE — Progress Notes (Signed)
PT Cancellation Note  Patient Details Name: Brian Benton MRN: [id number] DOB: May 03, 1956   Cancelled Treatment:    Reason Eval/Treat Not Completed: Fatigue/lethargy limiting ability to participate  Pt reports pain improved today however he is tired.  Pt reports possible procedure later today as well.  Pt encouraged to mobilize in preparation for moving at home (which he states he wishes to be able to do) however he reports fatigue this morning and would like therapy to check back this afternoon.  Will check back as schedule permits.   Jannette Spanner PT, DPT Acute Rehabilitation Services Pager: [phone] Office: [place] 03/05/2021, 10:04 AM

## 2021-03-05 NOTE — Progress Notes (Signed)
Patient ID: Brian Benton, male   DOB: [birth year], 65 y.o.   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  Lower extremity Doppler and CT of the abdomen and pelvis with contrast showed persistent complete occlusion of indwelling left iliofemoral venous stent with interval increase of occlusive inferior vena cava thrombus with circumferential bladder wall thickening.  IR, oncology and palliative care were consulted.  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 -Currently on IV Dilaudid as per palliative care.  Also on fentanyl patch and as needed oxycodone.  Continue Cymbalta and Lyrica. -Status post superior hypogastric nerve block on 03/04/2021 by IR.  If pain does not improve, IR will think about doing a neurolysis as well. -Patient has developed large right-sided ecchymosis.  Arixtra stopped by oncology on 03/04/2021.  Aspirin stopped as well on 03/04/2021.  History of metastatic bladder cancer -Oncology and palliative care team following -Currently still listed as full code.  Overall prognosis is very poor.  Patient contemplating home hospice as well.  Anemia of chronic disease and iron deficiency -Patient received 3 units of packed red cells so far during the hospital stay, last 1 being on 03/03/2021 for hemoglobin of 6.5.  Hemoglobin 7.7 this morning.  Monitor  H&H  Thrombocytopenia -Resolved  Leukocytosis -Possibly reactive.  No evidence of any infection.  Monitoring off antibiotics  Constipation -Continue bowel regimen  Essential hypertension -Blood pressure on the lower side.  Monitor  Mild protein calorie malnutrition -Encourage oral intake  Tobacco use -Nicotine patch as needed  DVT prophylaxis: SCDs Code Status: Full Family Communication: Wife at bedside on 03/05/2021 Disposition Plan: Status is: Inpatient  Remains inpatient appropriate because:Inpatient level of care appropriate due to severity of illness  Dispo: The patient is from: Home              Anticipated d/c is to: Home              Patient currently is not medically stable to d/c.   Difficult to place patient No  Consultants: Oncology/IR/palliative care  Procedures: superior hypogastric nerve block on 03/04/2021 by IR  Antimicrobials: None   Subjective: Patient seen and examined at bedside.  No fever, vomiting or worsening shortness of breath reported.  Still complains of intermittent pain.   Objective: Vitals:   03/04/21 2345 03/05/21 0009 03/05/21 0410 03/05/21 0418  BP: 117/72  123/72   Pulse: 93  88   Resp: 17 16 18 18   Temp: 98.4 F (36.9 C)  98.2 F (36.8 C)   TempSrc: Axillary  Oral   SpO2: 96% 93% 96% 94%  Weight:      Height:        Intake/Output Summary (Last 24 hours) at 03/05/2021 0739 Last data filed at 03/05/2021 0600 Gross per 24 hour  Intake 1240 ml  Output 550 ml  Net 690 ml    Filed Weights   02/25/21 1416 02/25/21 1557 03/04/21 1123  Weight: 77.1 kg 77.1 kg 63.6 kg  Examination:  General exam: Currently on room air.  No acute distress.  Looks chronically ill and deconditioned.   Respiratory system: Bilateral decreased breath sounds at bases with some crackles cardiovascular system: S1-S2 heard, rate controlled gastrointestinal system: Abdomen is distended mildly, soft and nontender.  Normal bowel sounds heard   extremities: No cyanosis or edema Central nervous system: Awake, slow to respond.  Poor historian. No focal neurological deficits.  Moving extremities  skin: Large right-sided ecchymosis present from almost below the axilla to almost the right hip.   Psychiatry: Flat affect   Data Reviewed: I have personally reviewed following labs and imaging studies  CBC: Recent Labs  Lab 03/01/21 0434 03/02/21 0515 03/03/21 0849 03/03/21 1631 03/04/21 0401 03/05/21 0450  WBC 13.6* 15.8* 16.4*  --  15.7* 13.2*  NEUTROABS  --   --   --   --  14.1* 11.9*  HGB 9.1* 7.6* 6.5* 7.4* 7.9* 7.7*  HCT 28.3* 23.8* 20.8* 23.6* 25.0* 24.2*  MCV 100.4* 100.0 103.0*  --  99.6 99.6  PLT 89* 101* 112*  --  125* 478    Basic Metabolic Panel: Recent Labs  Lab 02/28/21 0437 03/01/21 0434 03/02/21 0515 03/03/21 0849 03/04/21 0401 03/05/21 0450  NA 143 141 137  --  138 135  K 3.9 4.2 4.1  --  4.2 3.7  CL 108 108 107  --  108 101  CO2 27 26 22   --  25 23  GLUCOSE 119* 93 150*  --  128* 125*  BUN 31* 31* 29*  --  21 20  CREATININE 0.53* 0.98 0.94  --  0.75 0.76  CALCIUM 8.6* 8.5* 7.9*  --  8.7* 8.7*  MG 2.2 1.9 1.8 1.8  --  2.1    GFR: Estimated Creatinine Clearance: 81.7 mL/min (by C-G formula based on SCr of 0.76 mg/dL). Liver Function Tests: Recent Labs  Lab 03/04/21 0401 03/05/21 0450  AST 13* 11*  ALT 18 17  ALKPHOS 69 67  BILITOT 0.8 1.2  PROT 5.9* 5.6*  ALBUMIN 2.6* 2.5*    No results for input(s): LIPASE, AMYLASE in the last 168 hours. No results for input(s): AMMONIA in the last 168 hours. Coagulation Profile: Recent Labs  Lab 03/04/21 0401  INR 1.1    Cardiac Enzymes: No results for input(s): CKTOTAL, CKMB, CKMBINDEX, TROPONINI in the last 168 hours. BNP (last 3 results) No results for input(s): PROBNP in the last 8760 hours. HbA1C: No results for input(s): HGBA1C in the last 72 hours. CBG: Recent Labs  Lab 03/04/21 0912  GLUCAP 137*   Lipid Profile: No results  for input(s): CHOL, HDL, LDLCALC, TRIG, CHOLHDL, LDLDIRECT in the last 72 hours. Thyroid Function Tests: No results for input(s): TSH, T4TOTAL, FREET4, T3FREE, THYROIDAB in the last 72 hours. Anemia Panel: Recent Labs    03/03/21 0849  TIBC 182*  IRON 10*    Sepsis Labs: No results for input(s): PROCALCITON, LATICACIDVEN in the last 168 hours.   No results found for this or any previous visit (from the past 240 hour(s)).       Radiology Studies: No results found.      Scheduled Meds:  sodium chloride   Intravenous Once   Chlorhexidine Gluconate Cloth  6 each Topical Daily   DULoxetine  60 mg Oral QHS   fentaNYL  1 patch Transdermal Q48H   HYDROmorphone   Intravenous Q4H   LORazepam  1 mg Intravenous Once   pantoprazole  40 mg Oral BID  polyethylene glycol  17 g Oral BID   pregabalin  50 mg Oral TID   senna  2 tablet Oral BID   sodium chloride flush  10-40 mL Intracatheter Q12H   Continuous Infusions:         Aline August, MD [facility] 03/05/2021, 7:39 AM

## 2021-03-05 NOTE — Progress Notes (Signed)
2.5 ml of remaining dilaudid PCA syringe wasted with Jacob Moores RN witness.

## 2021-03-05 NOTE — Progress Notes (Signed)
Patient is currently admitted to the hospital. He will likely be discharged with hospice. Will continue to follow for post discharge follow up.   Oncology Nurse Navigator Documentation  Oncology Nurse Navigator Flowsheets 03/05/2021  Abnormal Finding Date -  Confirmed Diagnosis Date -  Diagnosis Status -  Planned Course of Treatment -  Phase of Treatment -  Chemotherapy Pending- Reason: -  Chemo/Radiation Concurrent Actual Start Date: -  Navigator Follow Up Date: 03/09/2021  Navigator Follow Up Reason: Appointment Review  Navigator Location [facility]  Referral Date to RadOnc/MedOnc -  Navigator Encounter Type Appt/Treatment Plan Review  Telephone -  Treatment Initiated Date -  Patient Visit Type MedOnc  Treatment Phase Active Tx  Barriers/Navigation Needs Coordination of Care;Education  Education -  Interventions None Required  Acuity Level 2-Minimal Needs (1-2 Barriers Identified)  Referrals -  Coordination of Care -  Education Method -  Support Groups/Services Friends and Family  Time Spent with Patient 15

## 2021-03-05 NOTE — Procedures (Signed)
Interventional Radiology Procedure Note  Procedure:   CT guided needle placement into the prevertebral space at L5-S1, targeting superior hypogastric nerve plexus.   Neurolysis with total 20cc ETOH, after 16cc 0.25% bupivacaine & 40mg  depo.   Complications: None Recommendations:  - Routine wound care - Do not submerge for 7 days - OK to restart [facility] tomorrow on scheldule - expect some discomfort next 6 hours-overnight from ETOH neurolysis.   Signed,  Dulcy Fanny. Earleen Newport, DO

## 2021-03-05 NOTE — Progress Notes (Signed)
Brian Benton is feeling better.  He has a local yesterday by Interventional Radiology.  He says that there is less pain.  Hopefully, he will not be set up for a neurolysis.  He and his wife think that this will be done today.  I am not sure this will be done.  He did have a bowel movement.  He was happy about this.  His labs show white cell count 13.2.  Hemoglobin 7.7.  Platelet count 158,000.  His BUN is 20 creatinine 0.06.  Calcium is 8.7 with an albumin of 2.5.  He still says he is eating okay.  He has had no vomiting.  I just read a article that showed a clinical trial that PCA Dilaudid was much more effective than oral in treating cancer pain.  As such, we might want to consider a CADD pump for him.  I realize this might be more cumbersome.  However, this certainly could help with pain.  If he gets the neurolysis, this also could be incredibly helpful for him to allow him to have more freedom and more functioning.  We will have to watch his hemoglobin.  He did get iron from what he says.  I suspect this may take a little bit before it kicks in.  I think we also gave a dose of Procrit.  Again, I think the goal now is to see about getting a neurolysis done.  Again I do not know if this is going to be done today.  I appreciate everybody's help.   Lattie Haw, MD  Psalms 147:3

## 2021-03-05 NOTE — Progress Notes (Signed)
Daily Progress Note   Patient Name: Brian Benton       Date: 03/05/2021 DOB: 10/17/1955  Age: 65 y.o. MRN#: [id number] Attending Physician: Aline August, MD Primary Care Physician: [facility] Date: 02/25/2021  Reason for Consultation/Follow-up: Establishing goals of care, Pain control, and Psychosocial/spiritual support  Subjective: I saw and examined Brian Benton today.  No family was present at the bedside.Marland Kitchen  PCA reviewed and he has used 30mg  of dilaudid in the last 24 hours.  He again stated that he feels pain has been better since receiving injection and plan is to go for neurolysis later this afternoon.  We reviewed constipation and he reports having bowel movement last night after getting suppository.    PT attempted therapy today but he declined at that time.  They are going to check back later this afternoon if possible.  We reviewed plan to have neurolysis completed, continue current regimen, and reassess his overall needs tomorrow.  At that point we will begin to work in conjunction with hospice to transition to a regimen he can go home on, hopefully early next week.  Questions and concerns addressed.   PMT will continue to support holistically.   Length of Stay: 7  Current Medications: Scheduled Meds:  . sodium chloride   Intravenous Once  . Alcohol  40 mL Intracatheter Once  . bupivacaine (PF)  20 mL Infiltration Once  . Chlorhexidine Gluconate Cloth  6 each Topical Daily  . DULoxetine  60 mg Oral QHS  . fentaNYL  1 patch Transdermal Q48H  . fentaNYL      . HYDROmorphone   Intravenous Q4H  . LORazepam  1 mg Intravenous Once  . methylPREDNISolone acetate  40 mg Intra-Lesional Once  . midazolam      . pantoprazole  40 mg Oral BID  .  polyethylene glycol  17 g Oral BID  . pregabalin  50 mg Oral TID  . senna  2 tablet Oral BID  . sodium chloride flush  10-40 mL Intracatheter Q12H    Continuous Infusions:     PRN Meds: acetaminophen **OR** acetaminophen, bisacodyl, diphenhydrAMINE **OR** diphenhydrAMINE, fentaNYL, guaiFENesin, hydrALAZINE, ipratropium-albuterol, lidocaine, LORazepam, metoprolol tartrate, midazolam, naloxone **AND** sodium chloride flush, ondansetron (ZOFRAN) IV, oxyCODONE, prochlorperazine, sodium chloride flush, sodium chloride flush, traZODone  Physical  Exam         Resting in bed Appears with mild generalized weakness Has left lower extremity ongoing discomfort Regular work of breathing Appears chronically ill  Vital Signs: BP 125/78 (BP Location: Left Arm)   Pulse 97   Temp 98.2 F (36.8 C)   Resp 14   Ht 6' (1.829 m)   Wt 63.6 kg   SpO2 (!) 2%   BMI 19.01 kg/m  SpO2: SpO2: (!) 2 % O2 Device: O2 Device: Nasal Cannula O2 Flow Rate: O2 Flow Rate (L/min): 2 L/min  Intake/output summary:  Intake/Output Summary (Last 24 hours) at 03/05/2021 1632 Last data filed at 03/05/2021 1527 Gross per 24 hour  Intake 1376 ml  Output 550 ml  Net 826 ml    LBM: Last BM Date: 03/04/21 Baseline Weight: Weight: 77.1 kg Most recent weight: Weight: 63.6 kg       Palliative Assessment/Data:      Patient Active Problem List   Diagnosis Date Noted  . Pressure injury of skin 03/05/2021  . Palliative care by specialist   . Goals of care, counseling/discussion   . Left hip pain 02/26/2021  . Deep vein thrombosis (DVT) of left iliofemoral vein ([name]) 02/25/2021  . Mild protein malnutrition ([name]) 02/25/2021  . Thrombocytopenia ([name]) 02/25/2021  . Leukocytosis 02/25/2021  . IDA (iron deficiency anemia) 09/22/2020  . 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) 06/08/2020  . 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 Leg swelling 03/02/2020  .  Paraspinal mass 03/02/2020  . Acute deep vein thrombosis (DVT) of left lower extremity ([name]) 03/02/2020  . Essential hypertension 10/31/2018  . 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 Mixed hyperlipidemia 10/03/2018  . NEVUS, ATYPICAL 10/23/2007  . 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  Recommendations/Plan: Pain, cancer related: Continue dilaudid PCA.  Continue basal rate of 0.5 mg/h.  Continue bolus dose to 0.5 mg with 20-minute lockout.  Appreciate IR trial injection and plan for neurolysis (potentially later this afternoon).   He will need both long and short acting medications at time of discharge.  We have been discussing potential for PCA as this may be the best way to control his pain once he transitions home.  For long-acting agent, we have discussed potential options of continuing fentanyl patch, discontinuation of fentanyl and increasing basal rate of Dilaudid via PCA, or addition of methadone for long-acting agent.  As noted, I will plan to reach out to hospice of [name] once we have a better idea what his overall needs will be following neurolysis procedure. Constipation, opioid related.  Continue MiraLAX.  Continue senna 2 tabs twice daily.  Continue Dulcolax suppository as needed. Eventual goal is to transition home with hospice. He is in the process of discussing acute considering CODE STATUS with his wife.  I did not push this conversation again today.  Goals of Care and Additional Recommendations: Limitations on Scope of Treatment: Currently full code, ongoing discussions about DNR DNI and home with hospice on appropriate  symptom management.  Palliative medicine team following closely  Code Status:    Code Status Orders  (  From admission, onward)           Start     Ordered   02/25/21 2215  Full code  Continuous        02/25/21 2244           Code Status History     Date Active Date Inactive Code Status Order ID Comments User Context   03/13/2020 1019 03/15/2020 0515 Full Code [id number]  Elam Dutch, MD Inpatient   03/02/2020 2020 03/06/2020 1955 Full Code [id number]  Chotiner, Yevonne Aline, MD Inpatient      Advance Directive Documentation    Flowsheet Row Most Recent Value  Type of Advance Directive Healthcare Power of Attorney, Living will  Pre-existing out of facility DNR order (yellow form or pink MOST form) --  "MOST" Form in Place? --       Prognosis:  Unable to determine  Discharge Planning: To Be Determined   Total Time 40 Prolonged Time Billed  no   Greater than 50%  of this time was spent counseling and coordinating care related to the above assessment and plan.  Micheline Rough, MD  Please contact Palliative Medicine Team phone at [phone] for questions and concerns.

## 2021-03-06 DIAGNOSIS — I82422 Acute embolism and thrombosis of left iliac vein: Secondary | ICD-10-CM | POA: Diagnosis not present

## 2021-03-06 DIAGNOSIS — G893 Neoplasm related pain (acute) (chronic): Secondary | ICD-10-CM | POA: Diagnosis not present

## 2021-03-06 DIAGNOSIS — K5903 Drug induced constipation: Secondary | ICD-10-CM | POA: Diagnosis not present

## 2021-03-06 DIAGNOSIS — Z515 Encounter for palliative care: Secondary | ICD-10-CM | POA: Diagnosis not present

## 2021-03-06 DIAGNOSIS — D72829 Elevated white blood cell count, unspecified: Secondary | ICD-10-CM | POA: Diagnosis not present

## 2021-03-06 DIAGNOSIS — C679 Malignant neoplasm of bladder, unspecified: Secondary | ICD-10-CM | POA: Diagnosis not present

## 2021-03-06 DIAGNOSIS — C799 Secondary malignant neoplasm of unspecified site: Secondary | ICD-10-CM | POA: Diagnosis not present

## 2021-03-06 LAB — CBC WITH DIFFERENTIAL/PLATELET
Abs Immature Granulocytes: 0.24 10*3/uL — ABNORMAL HIGH (ref 0.00–0.07)
Basophils Absolute: 0 10*3/uL (ref 0.0–0.1)
Basophils Relative: 0 %
Eosinophils Absolute: 0 10*3/uL (ref 0.0–0.5)
Eosinophils Relative: 0 %
HCT: 24.7 % — ABNORMAL LOW (ref 39.0–52.0)
Hemoglobin: 7.9 g/dL — ABNORMAL LOW (ref 13.0–17.0)
Immature Granulocytes: 1 %
Lymphocytes Relative: 2 %
Lymphs Abs: 0.4 10*3/uL — ABNORMAL LOW (ref 0.7–4.0)
MCH: 31.7 pg (ref 26.0–34.0)
MCHC: 32 g/dL (ref 30.0–36.0)
MCV: 99.2 fL (ref 80.0–100.0)
Monocytes Absolute: 0.5 10*3/uL (ref 0.1–1.0)
Monocytes Relative: 2 %
Neutro Abs: 19.1 10*3/uL — ABNORMAL HIGH (ref 1.7–7.7)
Neutrophils Relative %: 95 %
Platelets: 193 10*3/uL (ref 150–400)
RBC: 2.49 MIL/uL — ABNORMAL LOW (ref 4.22–5.81)
RDW: 18.2 % — ABNORMAL HIGH (ref 11.5–15.5)
WBC: 20.2 10*3/uL — ABNORMAL HIGH (ref 4.0–10.5)
nRBC: 0 % (ref 0.0–0.2)

## 2021-03-06 LAB — COMPREHENSIVE METABOLIC PANEL
ALT: 17 U/L (ref 0–44)
AST: 11 U/L — ABNORMAL LOW (ref 15–41)
Albumin: 2.5 g/dL — ABNORMAL LOW (ref 3.5–5.0)
Alkaline Phosphatase: 75 U/L (ref 38–126)
Anion gap: 7 (ref 5–15)
BUN: 24 mg/dL — ABNORMAL HIGH (ref 8–23)
CO2: 25 mmol/L (ref 22–32)
Calcium: 9.2 mg/dL (ref 8.9–10.3)
Chloride: 102 mmol/L (ref 98–111)
Creatinine, Ser: 0.91 mg/dL (ref 0.61–1.24)
GFR, Estimated: >60 mL/min (ref >60)
Glucose, Bld: 158 mg/dL — ABNORMAL HIGH (ref 70–99)
Potassium: 4.4 mmol/L (ref 3.5–5.1)
Sodium: 134 mmol/L — ABNORMAL LOW (ref 135–145)
Total Bilirubin: 0.9 mg/dL (ref 0.3–1.2)
Total Protein: 5.9 g/dL — ABNORMAL LOW (ref 6.5–8.1)

## 2021-03-06 LAB — MAGNESIUM: Magnesium: 2.2 mg/dL (ref 1.7–2.4)

## 2021-03-06 MED ORDER — EPOETIN ALFA 40000 UNIT/ML IJ SOLN
40000.0000 [IU] | INTRAMUSCULAR | Status: AC
  Administered 2021-03-06 – 2021-03-08 (×2): 40000 [IU] via SUBCUTANEOUS
  Filled 2021-03-06 (×2): qty 1

## 2021-03-06 NOTE — Progress Notes (Signed)
I definitely appreciate the outstanding efforts by Interventional Radiology.  And they did do the neurolysis yesterday.  He is having some cramping where they had the injection.  Hopefully this will improve.  We will have to see how he does pain wise.  If he has good pain control, then we should be able to improve his and simplify his pain medication regimen.  He is still somewhat anemic.  His hemoglobin is 7.9.  He did get IV iron.  I will give another dose of Procrit.  He is being followed by Palliative Care.  I do appreciate their wonderful care for him.  His labs show white cell count 20.2.  Hemoglobin 7.9.  Platelet count 193,000.  I think the white cell elevation is clearly from steroid use.  The sodium is 134.  Potassium 4.4.  BUN 24 creatinine 0.9.  His albumin is 2.5.  His vital signs are temperature 98.  Pulse 80.  Blood pressure 124/85.  His lungs are clear.  Cardiac exam regular rate and rhythm.  Abdomen is soft.  Bowel sounds are present.  Extremities shows some muscle atrophy in upper and lower extremities.  Skin exam does show the ecchymosis over on his right side.  Neurological exam shows no focal neurological deficits.  Again, our goal here is pain control with respect to the left hip area.  Hopefully, the neurolysis will help.  If so, then we can try to adjust his pain medications so that he can go home.  He is quite anemic.  We gave him iron a couple days ago.  I have him on some Procrit right now.  I know his care has been incredibly complicated.  The staff on [address] have done a superb job in managing him.  I appreciate all of their hard work.   Lattie Haw, MD  Colossians 3:23

## 2021-03-06 NOTE — Evaluation (Signed)
Physical Therapy Evaluation Patient Details Name: Brian Benton MRN: [id number] DOB: [birth year] Today's Date: 03/06/2021  History of Present Illness  65 year old male with history of metastatic bladder bladder cancer status post TURBT, hyperlipidemia, hypertension, iron deficiency anemia, thrombocytopenia, tobacco use, 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  Clinical Impression  Pt admitted with above diagnosis.  Pt currently with functional limitations due to the deficits listed below (see PT Problem List). Pt will benefit from skilled PT to increase their independence and safety with mobility to allow discharge to the venue listed below.  He is not interested in a w/c, but feel this would be helpful.  Recommend HHPT.        Recommendations for follow up therapy are one component of a multi-disciplinary discharge planning process, led by the attending physician.  Recommendations may be updated based on patient status, additional functional criteria and insurance authorization.  Follow Up Recommendations Home health PT    Equipment Recommendations  Other (comment) (discussed use of bed rail to add to his current bed.  He is not interested in a w/c, although this seems to be a piece of equipment that would be helpful for longer distances.)    Recommendations for Other Services       Precautions / Restrictions Precautions Precautions: Fall      Mobility  Bed Mobility Overal bed mobility: Needs Assistance Bed Mobility: Sidelying to Sit;Sit to Sidelying   Sidelying to sit: Min assist     Sit to sidelying: Min guard General bed mobility comments: MIN A to get trunk upright.  requires increased time    Transfers                 General transfer comment: Declined sit to stand or SPT due to feeling dizzy from his meds (per his report)  He did scoot towards Brian Benton with min/guard  Ambulation/Gait                Stairs             Wheelchair Mobility    Modified Rankin (Stroke Patients Only)       Balance Overall balance assessment: Needs assistance Sitting-balance support: Feet supported;Bilateral upper extremity supported Sitting balance-Leahy Scale: Fair                                       Pertinent Vitals/Pain Pain Assessment: 0-10 Pain Score: 2  Pain Location: L hip Pain Descriptors / Indicators: Sharp Pain Intervention(s): PCA encouraged    Home Living Family/patient expects to be discharged to:: Private residence Living Arrangements: Spouse/significant other Available Help at Discharge: Family;Available 24 hours/day   Home Access: Ramped entrance     Home Layout: One level;Laundry or work area in basement;Able to live on main level with bedroom/bathroom Home Equipment: Environmental consultant - 4 wheels;Shower seat;Bedside commode      Prior Function Level of Independence: Independent with assistive device(s)         Comments: walked with RW at home PTA     Hand Dominance        Extremity/Trunk Assessment        Lower Extremity Assessment Lower Extremity Assessment: Generalized weakness;LLE deficits/detail LLE Deficits / Details: position of comfort is L hip/knee flexion       Communication   Communication: No difficulties  Cognition Arousal/Alertness: Awake/alert Behavior During Therapy: WFL for tasks assessed/performed  Overall Cognitive Status: Within Functional Limits for tasks assessed                                        General Comments General comments (skin integrity, edema, etc.): mottled skin    Exercises General Exercises - Lower Extremity Gluteal Sets: Strengthening;Both;Seated;10 reps Long Arc Quad: 5 reps;Both Hip ABduction/ADduction: Both;10 reps;Seated   Assessment/Plan    PT Assessment Patient needs continued PT services  PT Problem List Decreased strength;Decreased range of motion;Decreased activity tolerance;Decreased  balance;Decreased mobility;Pain       PT Treatment Interventions DME instruction;Gait training;Therapeutic activities;Therapeutic exercise;Functional mobility training;Balance training    PT Goals (Current goals can be found in the Care Plan section)  Acute Rehab PT Goals Patient Stated Goal: Move more PT Goal Formulation: With patient Time For Goal Achievement: 03/20/21 Potential to Achieve Goals: Good    Frequency Min 3X/week   Barriers to discharge        Co-evaluation               AM-PAC PT "6 Clicks" Mobility  Outcome Measure Help needed turning from your back to your side while in a flat bed without using bedrails?: A Little Help needed moving from lying on your back to sitting on the side of a flat bed without using bedrails?: A Little Help needed moving to and from a bed to a chair (including a wheelchair)?: A Lot Help needed standing up from a chair using your arms (e.g., wheelchair or bedside chair)?: A Lot Help needed to walk in hospital room?: Total Help needed climbing 3-5 steps with a railing? : Total 6 Click Score: 12    End of Session   Activity Tolerance: Patient limited by fatigue;Other (comment) (some slight dizziness which he feels is due to dilaudid)   Nurse Communication: Mobility status PT Visit Diagnosis: Muscle weakness (generalized) (M62.81);Other abnormalities of gait and mobility (R26.89)    Time: 9811-9147 PT Time Calculation (min) (ACUTE ONLY): 37 min   Charges:   PT Evaluation $PT Eval Low Complexity: 1 Low PT Treatments $Therapeutic Activity: 8-22 mins        Brian Benton, PT  03/06/2021   Brian Benton 03/06/2021, 11:42 AM

## 2021-03-06 NOTE — Progress Notes (Signed)
Daily Progress Note   Patient Name: Brian Benton       Date: 03/06/2021 DOB: 11-19-1955  Age: 65 y.o. MRN#: [id number] Attending Physician: Aline August, MD Primary Care Physician: [facility] Date: 02/25/2021  Reason for Consultation/Follow-up: Establishing goals of care, Pain control, and Psychosocial/spiritual support  Subjective: I saw and examined Brian Benton today.  No family was present at the bedside.Marland Kitchen  PCA reviewed and he has again used 30mg  of dilaudid in the last 24 hours.  While his needs are not significantly decreased at this point, he does report that his pain feels much better following neurolysis.  We discussed referral to hospice of [facility] and he was in agreement with me reaching out to work to coordinate care with hospice of [place] today.  Questions and concerns addressed.   PMT will continue to support holistically.   Length of Stay: 8  Current Medications: Scheduled Meds:  . sodium chloride   Intravenous Once  . Chlorhexidine Gluconate Cloth  6 each Topical Daily  . DULoxetine  60 mg Oral QHS  . epoetin alfa  40,000 Units Subcutaneous QODAY  . fentaNYL  1 patch Transdermal Q48H  . HYDROmorphone   Intravenous Q4H  . LORazepam  1 mg Intravenous Once  . pantoprazole  40 mg Oral BID  . polyethylene glycol  17 g Oral BID  . pregabalin  50 mg Oral TID  . senna  2 tablet Oral BID  . sodium chloride flush  10-40 mL Intracatheter Q12H    Continuous Infusions:     PRN Meds: acetaminophen **OR** acetaminophen, bisacodyl, diphenhydrAMINE **OR** diphenhydrAMINE, fentaNYL, guaiFENesin, hydrALAZINE, ipratropium-albuterol, lidocaine, LORazepam, metoprolol tartrate, midazolam, naloxone **AND** sodium chloride flush, ondansetron  (ZOFRAN) IV, oxyCODONE, prochlorperazine, sodium chloride flush, sodium chloride flush, traZODone  Physical Exam         Resting in bed Appears with mild generalized weakness Has left lower extremity ongoing discomfort Regular work of breathing Appears chronically ill  Vital Signs: BP 126/75 (BP Location: Left Arm)   Pulse 84   Temp 98.1 F (36.7 C) (Oral)   Resp 20   Ht 6' (1.829 m)   Wt 63.6 kg   SpO2 97%   BMI 19.01 kg/m  SpO2: SpO2: 97 % O2 Device: O2 Device: Room Air O2 Flow Rate:  O2 Flow Rate (L/min): 0 L/min  Intake/output summary:  Intake/Output Summary (Last 24 hours) at 03/06/2021 1739 Last data filed at 03/06/2021 0600 Gross per 24 hour  Intake 490 ml  Output 450 ml  Net 40 ml    LBM: Last BM Date: 03/04/21 Baseline Weight: Weight: 77.1 kg Most recent weight: Weight: 63.6 kg       Palliative Assessment/Data:      Patient Active Problem List   Diagnosis Date Noted  . Pressure injury of skin 03/05/2021  . Palliative care by specialist   . Goals of care, counseling/discussion   . Left hip pain 02/26/2021  . Deep vein thrombosis (DVT) of left iliofemoral vein ([place]) 02/25/2021  . Mild protein malnutrition ([place]) 02/25/2021  . Thrombocytopenia ([place]) 02/25/2021  . Leukocytosis 02/25/2021  . IDA (iron deficiency anemia) 09/22/2020  . 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 Leg swelling 03/02/2020  . Paraspinal mass 03/02/2020  . Acute deep vein thrombosis (DVT) of left lower extremity ([place]) 03/02/2020  . Essential hypertension 10/31/2018  . 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 Mixed hyperlipidemia 10/03/2018  . NEVUS, ATYPICAL 10/23/2007  . Tobacco abuse 02/21/2007    Palliative Care Assessment & Plan   Patient Profile:   Assessment: Metastatic bladder cancer status post TURBT History of left iliofemoral  DVT Patient admitted with ongoing worsening left hip pain and left lower extremity pain  now on Dilaudid PCA for pain MRI done over the weekend showed tumor progression and possibility of tumor thrombus.  Recommendations/Plan: Pain, cancer related: Continue dilaudid PCA.  Continue basal rate of 0.5 mg/h.  Continue bolus dose to 0.5 mg with 20-minute lockout.  Appreciate IR involvement for neurolysis.  I called hospice of [name] to discuss eventual plan for discharge home with hospice services and to coordinate regarding care plan for pain management at time of discharge.  I was able to discuss with Dr. Ronne Binning from [facility] and we talked about a plan to continue with PCA at time of discharge.  As he is going to be going home with PCA with basal rate already, we also discussed trial off of fentanyl patch while increasing Dilaudid basal rate an equivalent amount to see if his pain can be controlled without needing to manage both PCA and patches.  I discussed with Brian Benton and his daughter a plan to remove fentanyl patch tomorrow morning while I can monitor how he does throughout the day to see if pain can adequately be controlled with removal of patch and subsequent utilization of higher basal rate on PCA.  I also discussed with Dr. Ronne Binning potential for initiation of methadone once he is home if pain remains poorly controlled as this may have better effect for neuropathic component due to additional NMDA action of methadone versus other opioids.  Constipation, opioid related.  Continue MiraLAX.  Continue senna 2 tabs twice daily.  Continue Dulcolax suppository as needed. Eventual goal is to transition home with hospice. He is in the process of discussing acute considering CODE STATUS with his wife.  We will plan to address this again tomorrow.  Goals of Care and Additional Recommendations: Limitations on Scope of Treatment: Currently full code, ongoing discussions about DNR DNI and home  with hospice on appropriate symptom management.  Palliative medicine team following closely  Code Status:    Code Status Orders  (From admission, onward)  Start     Ordered   02/25/21 2215  Full code  Continuous        02/25/21 2244           Code Status History     Date Active Date Inactive Code Status Order ID Comments User Context   03/13/2020 1019 03/15/2020 0515 Full Code [id number]  Elam Dutch, MD Inpatient   03/02/2020 2020 03/06/2020 1955 Full Code [id number]  Chotiner, Yevonne Aline, MD Inpatient      Advance Directive Documentation    Flowsheet Row Most Recent Value  Type of Advance Directive Healthcare Power of Attorney, Living will  Pre-existing out of facility DNR order (yellow form or pink MOST form) --  "MOST" Form in Place? --       Prognosis:  Unable to determine  Discharge Planning: To Be Determined   Total Time 55 Prolonged Time Billed  no   Greater than 50%  of this time was spent counseling and coordinating care related to the above assessment and plan.   Micheline Rough, MD  Please contact Palliative Medicine Team phone at [phone] for questions and concerns.

## 2021-03-06 NOTE — Progress Notes (Signed)
Patient ID: Brian Benton, male   DOB: 11/20/1955, 65 y.o.   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  Lower extremity Doppler and CT of the abdomen and pelvis with contrast showed persistent complete occlusion of indwelling left iliofemoral venous stent with interval increase of occlusive inferior vena cava thrombus with circumferential bladder wall thickening.  IR, oncology and palliative care were consulted.  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 -Currently on IV Dilaudid as per palliative care.  Also on fentanyl patch and as needed oxycodone.  Continue Cymbalta and Lyrica. -Patient has developed large right-sided ecchymosis.  Arixtra stopped by oncology on 03/04/2021.  Aspirin stopped as well on 03/04/2021. -Status post superior hypogastric nerve block on 03/04/2021 by IR.  -Status post neurolysis on 03/05/2021  History of metastatic bladder cancer -Oncology and palliative care team following -Currently still listed as full code.  Overall prognosis is very poor.  Patient contemplating home hospice as well.  Anemia of chronic disease and iron deficiency -Patient received 3 units of packed red cells so far during the hospital stay, last 1 being on 03/03/2021 for hemoglobin of 6.5.  Hemoglobin 7.9 this morning.  Monitor H&H  Thrombocytopenia -Resolved  Leukocytosis -Possibly reactive.  No evidence of  any infection.  Monitoring off antibiotics  Constipation -Continue bowel regimen  Essential hypertension -Blood pressure on the lower side.  Monitor  Mild protein calorie malnutrition -Encourage oral intake  Tobacco use -Nicotine patch as needed  DVT prophylaxis: SCDs Code Status: Full Family Communication: Wife at bedside on 03/05/2021 Disposition Plan: Status is: Inpatient  Remains inpatient appropriate because:Inpatient level of care appropriate due to severity of illness  Dispo: The patient is from: Home              Anticipated d/c is to: Home              Patient currently is not medically stable to d/c.   Difficult to place patient No  Consultants: Oncology/IR/palliative care  Procedures: superior hypogastric nerve block on 03/04/2021 by IR neurolysis on 03/05/2021  Antimicrobials: None   Subjective: Patient seen and examined at bedside.  No shortness breath, chest pain, fever or vomiting reported.  Objective: Vitals:   03/06/21 0000 03/06/21 0028 03/06/21 0441 03/06/21 0508  BP: 115/72   124/85  Pulse: 84   80  Resp: 16 16 16 16   Temp: 98.2 F (36.8 C)   98 F (36.7 C)  TempSrc: Oral   Oral  SpO2: 95%  95% 96%  Weight:      Height:        Intake/Output Summary (Last 24 hours) at 03/06/2021 0745 Last data filed at 03/05/2021 2300 Gross per 24 hour  Intake 496 ml  Output 250 ml  Net 246 ml    Filed Weights   02/25/21 1416 02/25/21 1557 03/04/21 1123  Weight: 77.1 kg 77.1 kg 63.6 kg    Examination:  General exam: No distress.  Still on room air.  Looks chronically ill and deconditioned.   Respiratory system: Decreased breath sounds at bases bilaterally with some crackles  cardiovascular system: Rate controlled, S1-S2 heard gastrointestinal system: Abdomen is still distended, soft and nontender.  Bowel sounds are heard  extremities: Mild lower extremity edema present; no clubbing Central nervous system: Alert, still slow to respond.  Poor  historian. No focal neurological deficits.  Moves extremities skin: Large right-sided ecchymosis present from almost below the axilla to almost the right hip.   Psychiatry: Affect is flat  Data Reviewed: I have personally reviewed following labs and imaging studies  CBC: Recent Labs  Lab 03/02/21 0515 03/03/21 0849 03/03/21 1631 03/04/21 0401 03/05/21 0450 03/06/21 0401  WBC 15.8* 16.4*  --  15.7* 13.2* 20.2*  NEUTROABS  --   --   --  14.1* 11.9* 19.1*  HGB 7.6* 6.5* 7.4* 7.9* 7.7* 7.9*  HCT 23.8* 20.8* 23.6* 25.0* 24.2* 24.7*  MCV 100.0 103.0*  --  99.6 99.6 99.2  PLT 101* 112*  --  125* 158 175    Basic Metabolic Panel: Recent Labs  Lab 03/01/21 0434 03/02/21 0515 03/03/21 0849 03/04/21 0401 03/05/21 0450 03/06/21 0401  NA 141 137  --  138 135 134*  K 4.2 4.1  --  4.2 3.7 4.4  CL 108 107  --  108 101 102  CO2 26 22  --  25 23 25   GLUCOSE 93 150*  --  128* 125* 158*  BUN 31* 29*  --  21 20 24*  CREATININE 0.98 0.94  --  0.75 0.76 0.91  CALCIUM 8.5* 7.9*  --  8.7* 8.7* 9.2  MG 1.9 1.8 1.8  --  2.1 2.2    GFR: Estimated Creatinine Clearance: 71.8 mL/min (by C-G formula based on SCr of 0.91 mg/dL). Liver Function Tests: Recent Labs  Lab 03/04/21 0401 03/05/21 0450 03/06/21 0401  AST 13* 11* 11*  ALT 18 17 17   ALKPHOS 69 67 75  BILITOT 0.8 1.2 0.9  PROT 5.9* 5.6* 5.9*  ALBUMIN 2.6* 2.5* 2.5*    No results for input(s): LIPASE, AMYLASE in the last 168 hours. No results for input(s): AMMONIA in the last 168 hours. Coagulation Profile: Recent Labs  Lab 03/04/21 0401  INR 1.1    Cardiac Enzymes: No results for input(s): CKTOTAL, CKMB, CKMBINDEX, TROPONINI in the last 168 hours. BNP (last 3 results) No results for input(s): PROBNP in the last 8760 hours. HbA1C: No results for input(s): HGBA1C in the last 72 hours. CBG: Recent Labs  Lab 03/04/21 0912  GLUCAP 137*    Lipid Profile: No results for input(s): CHOL, HDL, LDLCALC, TRIG, CHOLHDL,  LDLDIRECT in the last 72 hours. Thyroid Function Tests: No results for input(s): TSH, T4TOTAL, FREET4, T3FREE, THYROIDAB in the last 72 hours. Anemia Panel: Recent Labs    03/03/21 0849  TIBC 182*  IRON 10*    Sepsis Labs: No results for input(s): PROCALCITON, LATICACIDVEN in the last 168 hours.   No results found for this or any previous visit (from the past 240 hour(s)).       Radiology Studies: IR 3D Independent Darreld Mclean  Result Date: 03/05/2021 INDICATION: 65 year old male with intractable pelvic pain, progressive bladder cancer EXAM: IMAGE GUIDED REGIONAL NERVE BLOCK WITH NEEDLE PLACEMENT INTO THE SUPERIOR HYPOGASTRIC NERVE PLEXUS MEDICATIONS: None. ANESTHESIA/SEDATION: Moderate (conscious) sedation was employed during this procedure. A total of Versed 2.0 mg and Fentanyl 100 mcg was administered intravenously. Moderate Sedation Time: 22 minutes. The patient's level of consciousness and  vital signs were monitored continuously by radiology nursing throughout the procedure under my direct supervision. FLUOROSCOPY TIME:  Fluoroscopy Time: 2 minutes 47 seconds (247 mGy). COMPLICATIONS: NONE PROCEDURE: Informed written consent was obtained from the patient after a thorough discussion of the procedural risks, benefits and alternatives. All questions were addressed. Maximal Sterile Barrier Technique was utilized including caps, mask, sterile gowns, sterile gloves, sterile drape, hand hygiene and skin antiseptic. A timeout was performed prior to the initiation of the procedure. Patient was positioned supine position on the table in interventional radiology. Patient was made as comfortable as possible, although he had some difficulty with straightening bilateral hips given his intractable pain. A flat panel CT was performed for planning purposes. The patient was then prepped and draped in the usual sterile fashion. 1% lidocaine was used for local anesthesia. Using a combination proprietary General  electric software as well as fluoroscopy, 2 separate 21 gauge needle was placed from an anterior approach into the prevertebral space at the L5-S1 level. Small test injection with contrast was performed with additional flat panel CT performed to confirm location. Superior hypogastric nerve block was then performed with the administration of 8 cc of 0.25% bupivacaine through each needle for a total of 16 cc. Needle was removed. Sterile bandages were placed. Patient tolerated the procedure well and remained hemodynamically stable throughout. No complications were encountered and no significant blood loss. IMPRESSION: Status post image guided superior hypogastric nerve block. Signed, Dulcy Fanny. Dellia Nims, RPVI [facility] Radiology Electronically Signed   By: Corrie Mckusick D.O.   On: 03/05/2021 08:50   IR 3D Independent Darreld Mclean  Result Date: 03/05/2021 INDICATION: 65 year old male with intractable pelvic pain, progressive bladder cancer EXAM: IMAGE GUIDED REGIONAL NERVE BLOCK WITH NEEDLE PLACEMENT INTO THE SUPERIOR HYPOGASTRIC NERVE PLEXUS MEDICATIONS: None. ANESTHESIA/SEDATION: Moderate (conscious) sedation was employed during this procedure. A total of Versed 2.0 mg and Fentanyl 100 mcg was administered intravenously. Moderate Sedation Time: 22 minutes. The patient's level of consciousness and vital signs were monitored continuously by radiology nursing throughout the procedure under my direct supervision. FLUOROSCOPY TIME:  Fluoroscopy Time: 2 minutes 47 seconds (247 mGy). COMPLICATIONS: NONE PROCEDURE: Informed written consent was obtained from the patient after a thorough discussion of the procedural risks, benefits and alternatives. All questions were addressed. Maximal Sterile Barrier Technique was utilized including caps, mask, sterile gowns, sterile gloves, sterile drape, hand hygiene and skin antiseptic. A timeout was performed prior to the initiation of the  procedure. Patient was positioned supine position on the table in interventional radiology. Patient was made as comfortable as possible, although he had some difficulty with straightening bilateral hips given his intractable pain. A flat panel CT was performed for planning purposes. The patient was then prepped and draped in the usual sterile fashion. 1% lidocaine was used for local anesthesia. Using a combination proprietary General electric software as well as fluoroscopy, 2 separate 21 gauge needle was placed from an anterior approach into the prevertebral space at the L5-S1 level. Small test injection with contrast was performed with additional flat panel CT performed to confirm location. Superior hypogastric nerve block was then performed with the administration of 8 cc of 0.25% bupivacaine through each needle for a total of 16 cc. Needle was removed. Sterile bandages were placed. Patient tolerated the procedure well and remained hemodynamically stable throughout. No complications were encountered and no significant blood loss. IMPRESSION: Status post image guided superior hypogastric nerve block. Signed, Dulcy Fanny. Earleen Newport, DO,  RPVI [facility] Radiology Electronically Signed   By: Corrie Mckusick D.O.   On: 03/05/2021 08:50   IR Fluoro Guide Ndl Plmt / BX  Result Date: 03/05/2021 INDICATION: 65 year old male with intractable pelvic pain, progressive bladder cancer EXAM: IMAGE GUIDED REGIONAL NERVE BLOCK WITH NEEDLE PLACEMENT INTO THE SUPERIOR HYPOGASTRIC NERVE PLEXUS MEDICATIONS: None. ANESTHESIA/SEDATION: Moderate (conscious) sedation was employed during this procedure. A total of Versed 2.0 mg and Fentanyl 100 mcg was administered intravenously. Moderate Sedation Time: 22 minutes. The patient's level of consciousness and vital signs were monitored continuously by radiology nursing throughout the procedure under my direct supervision. FLUOROSCOPY TIME:   Fluoroscopy Time: 2 minutes 47 seconds (247 mGy). COMPLICATIONS: NONE PROCEDURE: Informed written consent was obtained from the patient after a thorough discussion of the procedural risks, benefits and alternatives. All questions were addressed. Maximal Sterile Barrier Technique was utilized including caps, mask, sterile gowns, sterile gloves, sterile drape, hand hygiene and skin antiseptic. A timeout was performed prior to the initiation of the procedure. Patient was positioned supine position on the table in interventional radiology. Patient was made as comfortable as possible, although he had some difficulty with straightening bilateral hips given his intractable pain. A flat panel CT was performed for planning purposes. The patient was then prepped and draped in the usual sterile fashion. 1% lidocaine was used for local anesthesia. Using a combination proprietary General electric software as well as fluoroscopy, 2 separate 21 gauge needle was placed from an anterior approach into the prevertebral space at the L5-S1 level. Small test injection with contrast was performed with additional flat panel CT performed to confirm location. Superior hypogastric nerve block was then performed with the administration of 8 cc of 0.25% bupivacaine through each needle for a total of 16 cc. Needle was removed. Sterile bandages were placed. Patient tolerated the procedure well and remained hemodynamically stable throughout. No complications were encountered and no significant blood loss. IMPRESSION: Status post image guided superior hypogastric nerve block. Signed, Dulcy Fanny. Dellia Nims, RPVI [facility] Radiology Electronically Signed   By: Corrie Mckusick D.O.   On: 03/05/2021 08:50        Scheduled Meds:  sodium chloride   Intravenous Once   Chlorhexidine Gluconate Cloth  6 each Topical Daily   DULoxetine  60 mg Oral QHS   epoetin alfa  40,000 Units Subcutaneous QODAY    fentaNYL  1 patch Transdermal Q48H   HYDROmorphone   Intravenous Q4H   LORazepam  1 mg Intravenous Once   pantoprazole  40 mg Oral BID   polyethylene glycol  17 g Oral BID   pregabalin  50 mg Oral TID   senna  2 tablet Oral BID   sodium chloride flush  10-40 mL Intracatheter Q12H   Continuous Infusions:         Aline August, MD [facility] 03/06/2021, 7:45 AM

## 2021-03-07 DIAGNOSIS — Z515 Encounter for palliative care: Secondary | ICD-10-CM | POA: Diagnosis not present

## 2021-03-07 DIAGNOSIS — K5903 Drug induced constipation: Secondary | ICD-10-CM | POA: Diagnosis not present

## 2021-03-07 DIAGNOSIS — D72829 Elevated white blood cell count, unspecified: Secondary | ICD-10-CM | POA: Diagnosis not present

## 2021-03-07 DIAGNOSIS — D696 Thrombocytopenia, unspecified: Secondary | ICD-10-CM | POA: Diagnosis not present

## 2021-03-07 DIAGNOSIS — C799 Secondary malignant neoplasm of unspecified site: Secondary | ICD-10-CM | POA: Diagnosis not present

## 2021-03-07 DIAGNOSIS — G893 Neoplasm related pain (acute) (chronic): Secondary | ICD-10-CM | POA: Diagnosis not present

## 2021-03-07 DIAGNOSIS — I82422 Acute embolism and thrombosis of left iliac vein: Secondary | ICD-10-CM | POA: Diagnosis not present

## 2021-03-07 LAB — CBC WITH DIFFERENTIAL/PLATELET
Abs Immature Granulocytes: 0.26 10*3/uL — ABNORMAL HIGH (ref 0.00–0.07)
Basophils Absolute: 0 10*3/uL (ref 0.0–0.1)
Basophils Relative: 0 %
Eosinophils Absolute: 0.4 10*3/uL (ref 0.0–0.5)
Eosinophils Relative: 2 %
HCT: 25.5 % — ABNORMAL LOW (ref 39.0–52.0)
Hemoglobin: 8.1 g/dL — ABNORMAL LOW (ref 13.0–17.0)
Immature Granulocytes: 1 %
Lymphocytes Relative: 3 %
Lymphs Abs: 0.6 10*3/uL — ABNORMAL LOW (ref 0.7–4.0)
MCH: 32.3 pg (ref 26.0–34.0)
MCHC: 31.8 g/dL (ref 30.0–36.0)
MCV: 101.6 fL — ABNORMAL HIGH (ref 80.0–100.0)
Monocytes Absolute: 0.8 10*3/uL (ref 0.1–1.0)
Monocytes Relative: 4 %
Neutro Abs: 16.4 10*3/uL — ABNORMAL HIGH (ref 1.7–7.7)
Neutrophils Relative %: 90 %
Platelets: 222 10*3/uL (ref 150–400)
RBC: 2.51 MIL/uL — ABNORMAL LOW (ref 4.22–5.81)
RDW: 18.7 % — ABNORMAL HIGH (ref 11.5–15.5)
WBC: 18.6 10*3/uL — ABNORMAL HIGH (ref 4.0–10.5)
nRBC: 0.3 % — ABNORMAL HIGH (ref 0.0–0.2)

## 2021-03-07 LAB — COMPREHENSIVE METABOLIC PANEL
ALT: 16 U/L (ref 0–44)
AST: 8 U/L — ABNORMAL LOW (ref 15–41)
Albumin: 2.6 g/dL — ABNORMAL LOW (ref 3.5–5.0)
Alkaline Phosphatase: 73 U/L (ref 38–126)
Anion gap: 9 (ref 5–15)
BUN: 27 mg/dL — ABNORMAL HIGH (ref 8–23)
CO2: 24 mmol/L (ref 22–32)
Calcium: 9.9 mg/dL (ref 8.9–10.3)
Chloride: 103 mmol/L (ref 98–111)
Creatinine, Ser: 1 mg/dL (ref 0.61–1.24)
GFR, Estimated: >60 mL/min (ref >60)
Glucose, Bld: 122 mg/dL — ABNORMAL HIGH (ref 70–99)
Potassium: 4 mmol/L (ref 3.5–5.1)
Sodium: 136 mmol/L (ref 135–145)
Total Bilirubin: 0.9 mg/dL (ref 0.3–1.2)
Total Protein: 6.1 g/dL — ABNORMAL LOW (ref 6.5–8.1)

## 2021-03-07 MED ORDER — SODIUM CHLORIDE 0.9% FLUSH: 9.0000 mL | INTRAVENOUS | Status: DC | PRN

## 2021-03-07 MED ORDER — NALOXONE HCL 0.4 MG/ML IJ SOLN: 0.4000 mg | INTRAMUSCULAR | Status: DC | PRN

## 2021-03-07 MED ORDER — HYDROMORPHONE 1 MG/ML IV SOLN: INTRAVENOUS | Status: DC

## 2021-03-07 MED ORDER — HYDROMORPHONE 1 MG/ML IV SOLN
INTRAVENOUS | Status: DC
  Administered 2021-03-07: 9.17 mg via INTRAVENOUS
  Administered 2021-03-07: 4.84 mg via INTRAVENOUS
  Administered 2021-03-08: 14.45 mg via INTRAVENOUS
  Administered 2021-03-08: 6.65 mg via INTRAVENOUS
  Administered 2021-03-08: 10.21 mg via INTRAVENOUS
  Filled 2021-03-07 (×2): qty 30

## 2021-03-07 MED ORDER — DIPHENHYDRAMINE HCL 12.5 MG/5ML PO ELIX: 12.5000 mg | ORAL_SOLUTION | Freq: Four times a day (QID) | ORAL | Status: DC | PRN

## 2021-03-07 MED ORDER — HYDROMORPHONE 1 MG/ML IV SOLN
INTRAVENOUS | Status: DC
  Administered 2021-03-07: 4.64 mg via INTRAVENOUS
  Administered 2021-03-07: 3.23 mg via INTRAVENOUS

## 2021-03-07 MED ORDER — ONDANSETRON HCL 4 MG/2ML IJ SOLN: 4.0000 mg | Freq: Four times a day (QID) | INTRAMUSCULAR | Status: DC | PRN

## 2021-03-07 MED ORDER — DIPHENHYDRAMINE HCL 50 MG/ML IJ SOLN: 12.5000 mg | Freq: Four times a day (QID) | INTRAMUSCULAR | Status: DC | PRN

## 2021-03-07 NOTE — Progress Notes (Signed)
  Chart review  S/P Image guided neurolysis for pain control.  Still requiring about 1 mg Dilaudid every hour, but he does report improvement in pain after neurolysis.  Agree with current plan of care.  Will sign off.  Horatio Bertz S Rashon Westrup PA-C 03/07/2021 1:46 PM

## 2021-03-07 NOTE — Progress Notes (Signed)
At 0152 when this nurse was discontinuing some out of date orders I accidentally DCed the dilaudid PCA orders- so these orders were reentered as previously ordered after consulting with pharmacy

## 2021-03-07 NOTE — Progress Notes (Signed)
Patient ID: Brian Benton, male   DOB: [birth year], 65 y.o.   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  Lower extremity Doppler and CT of the abdomen and pelvis with contrast showed persistent complete occlusion of indwelling left iliofemoral venous stent with interval increase of occlusive inferior vena cava thrombus with circumferential bladder wall thickening.  IR, oncology and palliative care were consulted.  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 -Currently on IV Dilaudid as per palliative care.  Also on fentanyl patch and as needed oxycodone.  Continue Cymbalta and Lyrica. -Patient has developed large right-sided ecchymosis.  Arixtra stopped by oncology on 03/04/2021.  Aspirin stopped as well on 03/04/2021. -Status post superior hypogastric nerve block on 03/04/2021 by IR.  -Status post neurolysis on 03/05/2021  History of metastatic bladder cancer -Oncology and palliative care team following.  Palliative care team trying to arrange for Dilaudid PCA pump at home. -Currently still listed as full code.  Overall prognosis is very poor.    Anemia of chronic disease and iron deficiency -Patient received 3 units of packed red cells so far during the hospital stay, last 1 being on 03/03/2021 for hemoglobin of 6.5.  Hemoglobin 7.9 this morning.  Monitor  H&H  Thrombocytopenia -Resolved  Leukocytosis -Possibly reactive.  No evidence of any infection.  Monitoring off antibiotics  Constipation -Continue bowel regimen  Essential hypertension -Blood pressure on the lower side.  Monitor  Mild protein calorie malnutrition -Encourage oral intake  Tobacco use -Nicotine patch as needed  DVT prophylaxis: SCDs Code Status: Full Family Communication: Wife at bedside on 03/05/2021 Disposition Plan: Status is: Inpatient  Remains inpatient appropriate because:Inpatient level of care appropriate due to severity of illness  Dispo: The patient is from: Home              Anticipated d/c is to: Home              Patient currently is not medically stable to d/c.   Difficult to place patient No  Consultants: Oncology/IR/palliative care  Procedures: superior hypogastric nerve block on 03/04/2021 by IR neurolysis on 03/05/2021  Antimicrobials: None   Subjective: Patient seen and examined at bedside.  Still complains of intermittent pain.  Denies worsening shortness of breath, fever or vomiting. Objective: Vitals:   03/07/21 0014 03/07/21 0444 03/07/21 0450 03/07/21 0808  BP:   111/73 128/78  Pulse:   90 87  Resp: 20 20 16 18   Temp:   (!) 97.4 F (36.3 C) 98.1 F (36.7 C)  TempSrc:   Oral Oral  SpO2: 96% 96% 100% 97%  Weight:      Height:       No intake or output data in the 24 hours ending 03/07/21 0813  Filed Weights   02/25/21 1416 02/25/21 1557 03/04/21 1123  Weight: 77.1 kg 77.1 kg 63.6 kg    Examination:  General exam: On room air.  No acute distress.  Looks chronically ill and deconditioned.   Respiratory  system: Bilateral decreased breath sounds at bases with scattered crackles  cardiovascular system: S1-S2 heard, rate controlled  gastrointestinal system: Abdomen is distended slightly, soft and nontender.  Normal bowel sounds heard extremities: No cyanosis; trace lower extremity edema present Central nervous system:  Awake, slow to respond.  Poor historian. No focal neurological deficits.  Moving extremities skin: Large right-sided ecchymosis present from almost below the axilla to almost the right hip.   Psychiatry: Extremely flat affect  Data Reviewed: I have personally reviewed following labs and imaging studies  CBC: Recent Labs  Lab 03/03/21 0849 03/03/21 1631 03/04/21 0401 03/05/21 0450 03/06/21 0401 03/07/21 0320  WBC 16.4*  --  15.7* 13.2* 20.2* 18.6*  NEUTROABS  --   --  14.1* 11.9* 19.1* 16.4*  HGB 6.5* 7.4* 7.9* 7.7* 7.9* 8.1*  HCT 20.8* 23.6* 25.0* 24.2* 24.7* 25.5*  MCV 103.0*  --  99.6 99.6 99.2 101.6*  PLT 112*  --  125* 158 193 409    Basic Metabolic Panel: Recent Labs  Lab 03/01/21 0434 03/02/21 0515 03/03/21 0849 03/04/21 0401 03/05/21 0450 03/06/21 0401 03/07/21 0320  NA 141 137  --  138 135 134* 136  K 4.2 4.1  --  4.2 3.7 4.4 4.0  CL 108 107  --  108 101 102 103  CO2 26 22  --  25 23 25 24   GLUCOSE 93 150*  --  128* 125* 158* 122*  BUN 31* 29*  --  21 20 24* 27*  CREATININE 0.98 0.94  --  0.75 0.76 0.91 1.00  CALCIUM 8.5* 7.9*  --  8.7* 8.7* 9.2 9.9  MG 1.9 1.8 1.8  --  2.1 2.2  --     GFR: Estimated Creatinine Clearance: 65.4 mL/min (by C-G formula based on SCr of 1 mg/dL). Liver Function Tests: Recent Labs  Lab 03/04/21 0401 03/05/21 0450 03/06/21 0401 03/07/21 0320  AST 13* 11* 11* 8*  ALT 18 17 17 16   ALKPHOS 69 67 75 73  BILITOT 0.8 1.2 0.9 0.9  PROT 5.9* 5.6* 5.9* 6.1*  ALBUMIN 2.6* 2.5* 2.5* 2.6*    No results for input(s): LIPASE, AMYLASE in the last 168 hours. No results for input(s): AMMONIA in the last 168 hours. Coagulation Profile: Recent Labs  Lab 03/04/21 0401  INR 1.1    Cardiac Enzymes: No results for input(s): CKTOTAL, CKMB, CKMBINDEX, TROPONINI in the last 168 hours. BNP (last 3 results) No results for input(s): PROBNP in the last 8760 hours. HbA1C: No results for input(s): HGBA1C in the last 72 hours. CBG: Recent  Labs  Lab 03/04/21 0912  GLUCAP 137*    Lipid Profile: No results for input(s): CHOL, HDL, LDLCALC, TRIG, CHOLHDL, LDLDIRECT in the last 72 hours. Thyroid Function Tests: No results for input(s): TSH, T4TOTAL, FREET4, T3FREE, THYROIDAB in the last 72 hours. Anemia Panel: No results for input(s): VITAMINB12, FOLATE, FERRITIN, TIBC, IRON, RETICCTPCT in the last 72 hours.  Sepsis Labs: No results for input(s): PROCALCITON, LATICACIDVEN in the last 168 hours.   No results found for this or any previous visit (from the past 240 hour(s)).       Radiology Studies: No results found.      Scheduled Meds:  Chlorhexidine Gluconate Cloth  6 each Topical Daily   DULoxetine  60 mg Oral QHS   epoetin alfa  40,000 Units Subcutaneous QODAY   fentaNYL  1 patch Transdermal Q48H   HYDROmorphone   Intravenous Q4H   pantoprazole  40 mg Oral  BID   polyethylene glycol  17 g Oral BID   pregabalin  50 mg Oral TID   senna  2 tablet Oral BID   sodium chloride flush  10-40 mL Intracatheter Q12H   Continuous Infusions:         Aline August, MD [facility] 03/07/2021, 8:13 AM

## 2021-03-07 NOTE — Progress Notes (Signed)
Daily Progress Note   Patient Name: Brian Benton       Date: 03/07/2021 DOB: [birth year]  Age: 65 y.o. MRN#: [id number] Attending Physician: Aline August, MD Primary Care Physician: [facility] Date: 02/25/2021  Reason for Consultation/Follow-up: Establishing goals of care, Pain control, and Psychosocial/spiritual support  Subjective: I saw and examined Brian Benton today.  No family was present at the bedside.Marland Kitchen  PCA reviewed and he has used roughly 1mg  per hour of dilaudid for the last 24 hours.  While his needs are not significantly decreased at this point, he does report that his pain feels much better following neurolysis.  We discussed plan that I discussed with physician from [facility] of [name] to discontinue fentanyl patch today and increase his basal rate to see if his pain can be well controlled with PCA alone rather than having to deal with both PCA and patches at time of discharge home with hospice services.  He was in agreement with this.  We discussed plan to discontinue fentanyl patch, increase basal rate to 0.75 mg/h, and utilize bolus dose of 0.5 mg with a 15-minute lockout.  This should allow sufficient medication to control his pain.  Additionally, we discussed again regarding limits of care and his CODE STATUS.  He tells me that he is going to discuss again with his wife today.  I recommended that we clarify this as he would be best served to have a clear plan in place whenever he discharges home.  I discussed him again regarding heroic interventions the end-of-life and how these would not add time or quality to his life in light of his overall condition.  He expressed understanding.  I told him my strong recommendation is we complete durable DNR  at time of discharge (if it is not done before) and he reports he would be agreeable to this.   Length of Stay: 9  Current Medications: Scheduled Meds:  . Chlorhexidine Gluconate Cloth  6 each Topical Daily  . DULoxetine  60 mg Oral QHS  . epoetin alfa  40,000 Units Subcutaneous QODAY  . HYDROmorphone   Intravenous Q4H  . pantoprazole  40 mg Oral BID  . polyethylene glycol  17 g Oral BID  . pregabalin  50 mg Oral TID  . senna  2  tablet Oral BID  . sodium chloride flush  10-40 mL Intracatheter Q12H    Continuous Infusions:     PRN Meds: acetaminophen **OR** acetaminophen, bisacodyl, diphenhydrAMINE **OR** diphenhydrAMINE, guaiFENesin, hydrALAZINE, ipratropium-albuterol, LORazepam, metoprolol tartrate, naloxone **AND** sodium chloride flush, ondansetron (ZOFRAN) IV, oxyCODONE, prochlorperazine, sodium chloride flush, sodium chloride flush, traZODone  Physical Exam         Resting in bed Appears with mild generalized weakness Has left lower extremity ongoing discomfort Regular work of breathing Appears chronically ill  Vital Signs: BP 129/73 (BP Location: Left Arm)   Pulse 88   Temp 99.3 F (37.4 C) (Oral)   Resp (!) 21   Ht 6' (1.829 m)   Wt 63.6 kg   SpO2 98%   BMI 19.01 kg/m  SpO2: SpO2: 98 % O2 Device: O2 Device: Room Air O2 Flow Rate: O2 Flow Rate (L/min): 0 L/min  Intake/output summary: No intake or output data in the 24 hours ending 03/07/21 1241  LBM: Last BM Date: 03/04/21 Baseline Weight: Weight: 77.1 kg Most recent weight: Weight: 63.6 kg       Palliative Assessment/Data:      Patient Active Problem List   Diagnosis Date Noted  . Pressure injury of skin 03/05/2021  . Palliative care by specialist   . Goals of care, counseling/discussion   . Left hip pain 02/26/2021  . Deep vein thrombosis (DVT) of left iliofemoral vein ([place]) 02/25/2021  . Mild protein malnutrition ([place]) 02/25/2021  . Thrombocytopenia ([place]) 02/25/2021  . Leukocytosis  02/25/2021  . IDA (iron deficiency anemia) 09/22/2020  . 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 Leg swelling 03/02/2020  . Paraspinal mass 03/02/2020  . Acute deep vein thrombosis (DVT) of left lower extremity ([place]) 03/02/2020  . Essential hypertension 10/31/2018  . 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 Mixed hyperlipidemia 10/03/2018  . NEVUS, ATYPICAL 10/23/2007  . 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  Recommendations/Plan: Pain, cancer related: Continue dilaudid PCA.  Increase basal rate to 0.75 mg/h.  Continue bolus dose of 0.5 mg but will liberalize further with 15 -minute lockout as we are discontinuing fentanyl patch.  Plan to continue with PCA at time of discharge and I discussed this with Dr. Ronne Binning [facility] hospice physician) on Saturday.  As he is going to be going home with PCA with basal rate already, we are planning to trial off of fentanyl patch while increasing Dilaudid basal rate an equivalent amount to see if his pain can be controlled without needing to manage both PCA and patches.  I am planning to evaluate him again tomorrow morning and reach out to [facility] to further coordinate PCA at time of discharge. Constipation, opioid related.  Continue MiraLAX.  Continue senna 2 tabs twice daily.  Continue Dulcolax suppository as needed. Eventual goal is to transition home with hospice. He is in the process of discussing acute considering CODE STATUS with his wife.  I discussed this again with him today  and he understands concerns and recommendation for completion of durable DNR at time of discharge (  if it is not completed prior).  Goals of Care and Additional Recommendations: Limitations on Scope of Treatment: Currently full code, ongoing discussions about DNR DNI and home with hospice on appropriate symptom management.  Palliative medicine team following closely  Code Status:    Code Status Orders  (From admission, onward)           Start     Ordered   02/25/21 2215  Full code  Continuous        02/25/21 2244           Code Status History     Date Active Date Inactive Code Status Order ID Comments User Context   03/13/2020 1019 03/15/2020 0515 Full Code [id number]  Elam Dutch, MD Inpatient   03/02/2020 2020 03/06/2020 1955 Full Code [id number]  Chotiner, Yevonne Aline, MD Inpatient      Advance Directive Documentation    Flowsheet Row Most Recent Value  Type of Advance Directive Healthcare Power of Attorney, Living will  Pre-existing out of facility DNR order (yellow form or pink MOST form) --  "MOST" Form in Place? --       Prognosis:  Unable to determine  Discharge Planning: To Be Determined   Total Time 50 Prolonged Time Billed  no   Greater than 50%  of this time was spent counseling and coordinating care related to the above assessment and plan.  Micheline Rough, MD  Please contact Palliative Medicine Team phone at [phone] for questions and concerns.

## 2021-03-08 DIAGNOSIS — M25552 Pain in left hip: Secondary | ICD-10-CM | POA: Diagnosis not present

## 2021-03-08 DIAGNOSIS — Z515 Encounter for palliative care: Secondary | ICD-10-CM | POA: Diagnosis not present

## 2021-03-08 DIAGNOSIS — C799 Secondary malignant neoplasm of unspecified site: Secondary | ICD-10-CM | POA: Diagnosis not present

## 2021-03-08 DIAGNOSIS — K5903 Drug induced constipation: Secondary | ICD-10-CM | POA: Diagnosis not present

## 2021-03-08 DIAGNOSIS — G893 Neoplasm related pain (acute) (chronic): Secondary | ICD-10-CM | POA: Diagnosis not present

## 2021-03-08 DIAGNOSIS — I82422 Acute embolism and thrombosis of left iliac vein: Secondary | ICD-10-CM | POA: Diagnosis not present

## 2021-03-08 DIAGNOSIS — I10 Essential (primary) hypertension: Secondary | ICD-10-CM | POA: Diagnosis not present

## 2021-03-08 MED ORDER — HYDROMORPHONE 1 MG/ML IV SOLN: INTRAVENOUS | Status: AC

## 2021-03-08 MED ORDER — OXYCODONE HCL 10 MG PO TABS: 10.0000 mg | ORAL_TABLET | ORAL | Status: AC | PRN

## 2021-03-08 MED ORDER — POLYETHYLENE GLYCOL 3350 17 G PO PACK: 17.0000 g | PACK | Freq: Two times a day (BID) | ORAL | 0 refills | Status: AC

## 2021-03-08 MED ORDER — SENNA 8.6 MG PO TABS: 2.0000 | ORAL_TABLET | Freq: Two times a day (BID) | ORAL | 0 refills | Status: AC

## 2021-03-08 MED ORDER — HEPARIN SOD (PORK) LOCK FLUSH 100 UNIT/ML IV SOLN
500.0000 [IU] | INTRAVENOUS | Status: AC | PRN
  Administered 2021-03-08: 500 [IU]

## 2021-03-08 MED ORDER — BISACODYL 10 MG RE SUPP: 10.0000 mg | Freq: Every day | RECTAL | 0 refills | Status: AC | PRN

## 2021-03-08 MED ORDER — DULOXETINE HCL 60 MG PO CPEP: 60.0000 mg | ORAL_CAPSULE | Freq: Every day | ORAL | 0 refills | Status: AC

## 2021-03-08 MED ORDER — HYDROMORPHONE BOLUS VIA INFUSION
1.0000 mg | Freq: Once | INTRAVENOUS | Status: AC
  Administered 2021-03-08: 1 mg via INTRAVENOUS
  Filled 2021-03-08: qty 1

## 2021-03-08 MED ORDER — PREGABALIN 50 MG PO CAPS: 50.0000 mg | ORAL_CAPSULE | Freq: Three times a day (TID) | ORAL | 0 refills | Status: AC

## 2021-03-08 NOTE — Progress Notes (Signed)
The patient is injury-free, afebrile, alert, and oriented X 4. Vital signs were within the baseline during this shift. Pt verbalizes that his pain is under control with current pain regiment and PCA settings. He denies chest pain, SOB, nausea, vomiting, dizziness, signs or symptoms of bleeding, or acute changes during this shift. We will continue to monitor and work toward achieving the care plan goals.

## 2021-03-08 NOTE — TOC Transition Note (Addendum)
Transition of Care [facility]) - CM/SW Discharge Note   Patient Details  Name: Brian Benton MRN: [id number] Date of Birth: 11-Nov-1955  [facility] CM/SW Contact:  Trish Mage, LCSW Phone Number: 03/08/2021, 12:09 PM   Clinical Narrative:  Patient who is stable for d/c will return home with support of [facility].  Unable to call for PTAR until we have confirmation from [place] at [facility] that they have confirmed of receiving PCA pump and medication. TOC will continue to follow during the course of hospitalization.  Addendum: Received confirmation that all supplies have been received. PTAR arranged.  Addendum: Nursing.  If pick up before 5, please call [phone] and ask for Cassandra to let her know he is on the way.  If after 5, call [phone] and let them know he is on the way.     Final next level of care: Home w Hospice Care Barriers to Discharge: Barriers Resolved   Patient Goals and CMS Choice        Discharge Placement                       Discharge Plan and Services                                     Social Determinants of Health (SDOH) Interventions     Readmission Risk Interventions No flowsheet data found.

## 2021-03-08 NOTE — Discharge Summary (Signed)
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  Lower extremity Doppler and CT of the abdomen and pelvis with contrast showed persistent complete occlusion of indwelling left iliofemoral venous stent with interval increase of occlusive inferior vena cava thrombus with circumferential bladder wall thickening.  IR, oncology and palliative care were consulted.  During the hospitalization, heparin drip was switched to Arixtra which was subsequently discontinued by oncology because of large right-sided ecchymosis.  He underwent superior hypogastric nerve block on 03/04/2021 followed by neurolysis on 03/05/2021.  He is still on Dilaudid PCA pump.  Palliative care has arranged for patient to be discharged home with home hospice to follow him.  He will be discharged home today on Dilaudid PCA pump.  Overall prognosis is very poor but patient is still full code.  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 -Currently on IV Dilaudid as per palliative care.  Also on  as needed oxycodone.  Continue Cymbalta and Lyrica. Fentanyl patch discontinued on 03/06/2021 by palliative care. -Patient has developed large right-sided ecchymosis.  Arixtra stopped by oncology on 03/04/2021.  Aspirin stopped as well on 03/04/2021. -Status post superior hypogastric nerve block on 03/04/2021 by IR.  -Status post neurolysis on 03/05/2021 -Palliative care has arranged for patient to be discharged home with home hospice to follow him.  He will be discharged home today on Dilaudid PCA pump.    History of metastatic bladder cancer -Oncology and palliative care team following.   -Currently still listed as full code.  He is in the process of discussing CODE STATUS with his wife.  Overall prognosis is very poor.     Anemia of chronic disease and iron deficiency -Patient received 3 units of packed red cells so far during the hospital stay, last 1 being on 03/03/2021 for hemoglobin of 6.5.  Hemoglobin 8.1 on 03/07/2021.     Thrombocytopenia -Resolved   Leukocytosis -Possibly reactive.  No evidence of any infection.  Monitoring off antibiotics   Constipation -Continue bowel regimen   Essential hypertension -Blood pressure on the lower side.  Outpatient follow-up  Mild protein calorie malnutrition -Encourage oral intake   Tobacco use     Discharge Instructions  Discharge Instructions     Diet - low sodium heart healthy   Complete by: As directed    Increase activity slowly   Complete by: As directed    No wound care   Complete by: As directed       Allergies as of 03/08/2021   No Known Allergies      Medication List     STOP taking these medications    Aspirin Low Dose  81 MG EC tablet Generic drug: aspirin   dexamethasone 4 MG tablet Commonly known as: DECADRON   dexamethasone 6 MG tablet Commonly known as: DECADRON   Eliquis 5 MG Tabs tablet Generic drug: apixaban   fentaNYL 100  MCG/HR Commonly known as: DURAGESIC   gabapentin 600 MG tablet Commonly known as: NEURONTIN   HYDROmorphone 8 MG tablet Commonly known as: Dilaudid   meloxicam 15 MG tablet Commonly known as: MOBIC   ondansetron 8 MG disintegrating tablet Commonly known as: ZOFRAN-ODT   orphenadrine 100 MG tablet Commonly known as: NORFLEX   temazepam 15 MG capsule Commonly known as: RESTORIL   traMADol 50 MG tablet Commonly known as: ULTRAM       TAKE these medications    acetaminophen 500 MG tablet Commonly known as: TYLENOL Take 500 mg by mouth every 6 (six) hours as needed for moderate pain.   bisacodyl 10 MG suppository Commonly known as: DULCOLAX Place 1 suppository (10 mg total) rectally daily as needed for severe constipation.   DULoxetine 60 MG capsule Commonly known as: CYMBALTA Take 1 capsule (60 mg total) by mouth at bedtime. What changed:  medication strength how much to take when to take this additional instructions   Generlac 10 GM/15ML Soln Generic drug: lactulose (encephalopathy) TAKE 30MLS BY MOUTH EVERY 4 HOURS AS NEEDED What changed:  how much to take how to take this when to take this reasons to take this   HYDROmorphone 1 mg/mL injection Commonly known as: DILAUDID Dose as per home hospice/palliative care   lidocaine-prilocaine cream Commonly known as: EMLA Apply to affected area once What changed:  how much to take how to take this when to take this additional instructions   LORazepam 0.5 MG tablet Commonly known as: ATIVAN Take 2 tablets (1 mg total) by mouth every 6 (six) hours as needed for anxiety. What changed: Another medication with the same name was removed. Continue taking this medication, and follow the directions you see here.   multivitamin with minerals tablet Take 1 tablet by mouth every other day.   naloxone 4 MG/0.1ML Liqd nasal spray kit Commonly known as: NARCAN Place 1 spray into the nose once.   ondansetron 8 MG  tablet Commonly known as: Zofran Take 1 tablet (8 mg total) by mouth 2 (two) times daily as needed for refractory nausea / vomiting. Start on day 3 after chemo.   Oxycodone HCl 10 MG Tabs Take 1 tablet (10 mg total) by mouth every 3 (three) hours as needed (pain). What changed:  how much to take when to take this reasons to take this Another medication with the same name was removed. Continue taking this medication, and follow the directions you see here.   pantoprazole 40 MG tablet Commonly known as: PROTONIX Take 1 tablet (40 mg total) by mouth 2 (two) times daily.   polyethylene glycol 17 g packet Commonly known as: MIRALAX / GLYCOLAX Take 17 g by mouth 2 (two) times daily. What changed: when to take this   pregabalin 50 MG capsule Commonly known as: LYRICA Take 1 capsule (50 mg total) by mouth 3 (three) times daily. What changed:  when to take this additional instructions   prochlorperazine 10 MG tablet Commonly known as: COMPAZINE Take 1 tablet (10 mg total) by mouth every 6 (six) hours as needed (Nausea or vomiting). What changed: Another medication with the same name was removed. Continue taking this medication, and follow the directions you see here.   senna 8.6  MG Tabs tablet Commonly known as: SENOKOT Take 2 tablets (17.2 mg total) by mouth 2 (two) times daily.   sodium chloride 0.65 % Soln nasal spray Commonly known as: OCEAN Place 1 spray into both nostrils daily as needed for congestion (Allergies).        Follow-up [place], [place]. Schedule an appointment as soon as possible for a visit in 1 week(s).   Contact information: [address] [place]  [place] [id number] [phone]         home hospice Follow up.   Why: At earliest convenience               No Known Allergies  Consultations: Oncology/IR/palliative care   Procedures/Studies: CT GUIDED NEEDLE PLACEMENT  Result Date: 03/08/2021 INDICATION:  65 year old male with a history of intractable pelvic pain secondary to malignancy, presents for CT-guided needle placement and neurolysis EXAM: CT-GUIDED NEEDLE PLACEMENT NEUROLYSIS OF SUPERIOR HYPOGASTRIC NERVE PLEXUS MEDICATIONS: None. ANESTHESIA/SEDATION: Moderate (conscious) sedation was employed during this procedure. A total of Versed 2.0 mg and Fentanyl 100 mcg was administered intravenously. Moderate Sedation Time: 28 minutes. The patient's level of consciousness and vital signs were monitored continuously by radiology nursing throughout the procedure under my direct supervision. FLUOROSCOPY TIME:  CT COMPLICATIONS: None PROCEDURE: Informed written consent was obtained from the patient after a thorough discussion of the procedural risks, benefits and alternatives. All questions were addressed. Maximal Sterile Barrier Technique was utilized including caps, mask, sterile gowns, sterile gloves, sterile drape, hand hygiene and skin antiseptic. A timeout was performed prior to the initiation of the procedure. Patient was positioned supine position on the CT gantry table. Scout CT of the pelvis was acquired for planning purposes. The patient was then prepped and draped in the usual sterile fashion. 1% lidocaine was used for local anesthesia. Using CT guidance, 2 separate 21 gauge 20 cm Chiba needles were advanced incrementally, targeting the superior hypogastric nerve plexus anterior to the L5-S1 level. Once both needles were in position, small amount of dilute contrast confirmed location within the fat plane anterior to the spine. 8 cc of 0.25% bupivacaine was then administered through each needle, as well as 20 mg Depo-Medrol through each needle, for a total of 40 mg Depo-Medrol. After administration of the local anesthesia and the steroid, we then administered a total of 20 cc absolute dehydrated alcohol, 10 cc through each needle. This was administered slowly, monitoring the patient's discomfort. After  administration of a total of 20 cc of absolute alcohol, both needles were removed. Sterile bandages were placed. Patient tolerated the procedure well and remained hemodynamically stable throughout. No complications were encountered and no significant blood loss. IMPRESSION: Status post CT-guided needle placement for neural lysis of superior hypogastric nerve plexus. Signed, Dulcy Fanny. Dellia Nims, RPVI [facility] Radiology Electronically Signed   By: Corrie Mckusick D.O.   On: 03/08/2021 08:40   MR Lumbar Spine W Wo Contrast  Result Date: 02/28/2021 CLINICAL DATA:  Urologic cancer, assess treatment response On treatment. Assess for progressive disease. Metastatic bladder cancer. EXAM: MRI LUMBAR SPINE WITHOUT AND WITH CONTRAST TECHNIQUE: Multiplanar and multiecho pulse sequences of the lumbar spine were obtained without and with intravenous contrast. CONTRAST:  7.95m GADAVIST GADOBUTROL 1 MMOL/ML IV SOLN COMPARISON:  Lumbar spine CT 02/25/2021. Lumbar spine MRI 01/04/2021. FINDINGS: Multiple sequences are severely motion degraded including all axial sequences (particularly severe on the axial postcontrast T1 sequence). Segmentation:  Standard. Alignment:  Trace retrolisthesis of L2 on L3. Vertebrae: No acute lumbar spine fracture. Known tumor eroding the left sacral ala which has likely mildly progressed and mildly involves the left lateral aspect of the L5 vertebral body. Conus medullaris and cauda equina: Conus extends to the L1-2 level. Conus and cauda equina appear normal. Paraspinal and other soft tissues: Known large soft tissue mass in the retroperitoneum extending along the left iliopsoas musculature with sacral ala and left L5 vertebral body involvement as noted above. Tumor extends into the left L5-S1 neural foramen. Complete assessment of tumor is limited by incomplete coverage on this lumbar MRI and motion artifact, better evaluated on the recent CT.  Artifact from left iliac venous stent. Chronic bladder wall thickening. Bilateral renal cysts. Disc levels: Mild disc bulging at L2-3, L3-4, and L4-5. Moderate multilevel facet hypertrophy. Mild bilateral neural foraminal stenosis at L4-5. No significant spinal stenosis. IMPRESSION: 1. Severely motion degraded examination. 2. 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 Electronically Signed   By: Logan Bores M.D.   On: 02/28/2021 14:07   CT ABDOMEN PELVIS W CONTRAST  Result Date: 02/25/2021 CLINICAL DATA:  Urologic cancer, staging bladder cancer with mets to bone. Prior PET-CT demonstrates Stable left retroperitoneal metastasis along the left anterior aspect of the lumbosacral junction involving the left iliopsoas musculature.  MRI demonstrates left sacral metastasis. EXAM: CT ABDOMEN AND PELVIS WITH CONTRAST CT LUMBAR SPINE WITHOUT CONTRAST TECHNIQUE: Multidetector CT imaging of the abdomen and pelvis was performed using the standard protocol following bolus administration of intravenous contrast. Multidetector CT imaging of the lumbar spine was performed without intravenous contrast administration. Multiplanar CT image reconstructions were also generated. CONTRAST:  83m OMNIPAQUE IOHEXOL 350 MG/ML SOLN COMPARISON:  PET-CT 09/29/2020, MRI lumbar spine 01/04/2021, ct abdomen/pelvis 05/18/20 FINDINGS: CT abdomen/pelvis: Lower chest: Linear atelectasis versus scarring of the right lower lobe. Limited evaluation due to motion artifact. No acute abnormality. Hepatobiliary: No focal liver abnormality. No gallstones, gallbladder wall thickening, or pericholecystic fluid. No biliary dilatation. Pancreas: No focal lesion. Normal pancreatic contour. No surrounding inflammatory changes. No main pancreatic ductal dilatation. Spleen: Normal in size without focal abnormality. Adrenals/Urinary Tract: No adrenal nodule  bilaterally. Bilateral kidneys enhance symmetrically. There is a 1.7 cm fluid density lesion within the right kidney that likely represents a simple renal cyst. Similar findings are noted within the left kidney measuring to 2.3 cm. Subcentimeter hypodensities are too small to characterize. No hydronephrosis. No hydroureter. Circumferential urinary bladder wall thickening. Stomach/Bowel: Stomach is within normal limits. No evidence of bowel wall thickening or dilatation. Stool throughout the colon. Appendix appears normal. 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 There is a left common iliac to external iliac to femoral vein stent. No abdominal aorta or iliac aneurysm. Severe calcified and noncalcified atherosclerotic plaque of the aorta and its branches. No abdominal, pelvic, or inguinal lymphadenopathy. Reproductive: Prostate is unremarkable. Other: No intraperitoneal free fluid. No intraperitoneal free gas. No organized fluid collection. Musculoskeletal: Similar-appearing approximately 6 cm soft tissue density along the iliopsoas muscle with associated poorly visualized erosion of the left sacral ala (2:57). No suspicious lytic or blastic osseous lesions. No acute displaced fracture. Multilevel degenerative changes of the spine. CT lumbar spine: Segmentation: 5 lumbar type vertebrae. Alignment: Normal. Vertebrae: Mild degenerative changes. No acute  fracture or focal pathologic process. Paraspinal and other soft tissues: Negative. IMPRESSION: 1. Limited evaluation of the upper abdomen, pelvis, spine due to motion artifact. 2. 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 3. Circumferential urinary bladder wall thickening. 4. Similar-appearing known left  sacral al a metastasis/erosion with associated iliopsoas soft tissue density. 5. Constipation. 6. No acute displaced fracture or traumatic listhesis of the lumbar spine. Electronically Signed   By: Iven Finn M.D.   On: 02/25/2021 20:51   IR 3D Independent Darreld Mclean  Result Date: 03/05/2021 INDICATION: 65 year old male with intractable pelvic pain, progressive bladder cancer EXAM: IMAGE GUIDED REGIONAL NERVE BLOCK WITH NEEDLE PLACEMENT INTO THE SUPERIOR HYPOGASTRIC NERVE PLEXUS MEDICATIONS: None. ANESTHESIA/SEDATION: Moderate (conscious) sedation was employed during this procedure. A total of Versed 2.0 mg and Fentanyl 100 mcg was administered intravenously. Moderate Sedation Time: 22 minutes. The patient's level of consciousness and vital signs were monitored continuously by radiology nursing throughout the procedure under my direct supervision. FLUOROSCOPY TIME:  Fluoroscopy Time: 2 minutes 47 seconds (247 mGy). COMPLICATIONS: NONE PROCEDURE: Informed written consent was obtained from the patient after a thorough discussion of the procedural risks, benefits and alternatives. All questions were addressed. Maximal Sterile Barrier Technique was utilized including caps, mask, sterile gowns, sterile gloves, sterile drape, hand hygiene and skin antiseptic. A timeout was performed prior to the initiation of the procedure. Patient was positioned supine position on the table in interventional radiology. Patient was made as comfortable as possible, although he had some difficulty with straightening bilateral hips given his intractable pain. A flat panel CT was performed for planning purposes. The patient was then prepped and draped in the usual sterile fashion. 1% lidocaine was used for local anesthesia. Using a combination proprietary General electric software as well as fluoroscopy, 2 separate 21 gauge needle was placed from an anterior approach into the prevertebral space at the L5-S1 level. Small test injection with  contrast was performed with additional flat panel CT performed to confirm location. Superior hypogastric nerve block was then performed with the administration of 8 cc of 0.25% bupivacaine through each needle for a total of 16 cc. Needle was removed. Sterile bandages were placed. Patient tolerated the procedure well and remained hemodynamically stable throughout. No complications were encountered and no significant blood loss. IMPRESSION: Status post image guided superior hypogastric nerve block. Signed, Dulcy Fanny. Dellia Nims, RPVI [facility] Radiology Electronically Signed   By: Corrie Mckusick D.O.   On: 03/05/2021 08:50   IR 3D Independent Darreld Mclean  Result Date: 03/05/2021 INDICATION: 65 year old male with intractable pelvic pain, progressive bladder cancer EXAM: IMAGE GUIDED REGIONAL NERVE BLOCK WITH NEEDLE PLACEMENT INTO THE SUPERIOR HYPOGASTRIC NERVE PLEXUS MEDICATIONS: None. ANESTHESIA/SEDATION: Moderate (conscious) sedation was employed during this procedure. A total of Versed 2.0 mg and Fentanyl 100 mcg was administered intravenously. Moderate Sedation Time: 22 minutes. The patient's level of consciousness and vital signs were monitored continuously by radiology nursing throughout the procedure under my direct supervision. FLUOROSCOPY TIME:  Fluoroscopy Time: 2 minutes 47 seconds (247 mGy). COMPLICATIONS: NONE PROCEDURE: Informed written consent was obtained from the patient after a thorough discussion of the procedural risks, benefits and alternatives. All questions were addressed. Maximal Sterile Barrier Technique was utilized including caps, mask, sterile gowns, sterile gloves, sterile drape, hand hygiene and skin antiseptic. A timeout was performed prior to the initiation of the procedure. Patient was positioned supine position  on the table in interventional radiology. Patient was made as comfortable as possible, although he had some difficulty with  straightening bilateral hips given his intractable pain. A flat panel CT was performed for planning purposes. The patient was then prepped and draped in the usual sterile fashion. 1% lidocaine was used for local anesthesia. Using a combination proprietary General electric software as well as fluoroscopy, 2 separate 21 gauge needle was placed from an anterior approach into the prevertebral space at the L5-S1 level. Small test injection with contrast was performed with additional flat panel CT performed to confirm location. Superior hypogastric nerve block was then performed with the administration of 8 cc of 0.25% bupivacaine through each needle for a total of 16 cc. Needle was removed. Sterile bandages were placed. Patient tolerated the procedure well and remained hemodynamically stable throughout. No complications were encountered and no significant blood loss. IMPRESSION: Status post image guided superior hypogastric nerve block. Signed, Dulcy Fanny. Dellia Nims, RPVI [facility] Radiology Electronically Signed   By: Corrie Mckusick D.O.   On: 03/05/2021 08:50   IR Fluoro Guide Ndl Plmt / BX  Result Date: 03/05/2021 INDICATION: 65 year old male with intractable pelvic pain, progressive bladder cancer EXAM: IMAGE GUIDED REGIONAL NERVE BLOCK WITH NEEDLE PLACEMENT INTO THE SUPERIOR HYPOGASTRIC NERVE PLEXUS MEDICATIONS: None. ANESTHESIA/SEDATION: Moderate (conscious) sedation was employed during this procedure. A total of Versed 2.0 mg and Fentanyl 100 mcg was administered intravenously. Moderate Sedation Time: 22 minutes. The patient's level of consciousness and vital signs were monitored continuously by radiology nursing throughout the procedure under my direct supervision. FLUOROSCOPY TIME:  Fluoroscopy Time: 2 minutes 47 seconds (247 mGy). COMPLICATIONS: NONE PROCEDURE: Informed written consent was obtained from the patient after a thorough discussion of the procedural  risks, benefits and alternatives. All questions were addressed. Maximal Sterile Barrier Technique was utilized including caps, mask, sterile gowns, sterile gloves, sterile drape, hand hygiene and skin antiseptic. A timeout was performed prior to the initiation of the procedure. Patient was positioned supine position on the table in interventional radiology. Patient was made as comfortable as possible, although he had some difficulty with straightening bilateral hips given his intractable pain. A flat panel CT was performed for planning purposes. The patient was then prepped and draped in the usual sterile fashion. 1% lidocaine was used for local anesthesia. Using a combination proprietary General electric software as well as fluoroscopy, 2 separate 21 gauge needle was placed from an anterior approach into the prevertebral space at the L5-S1 level. Small test injection with contrast was performed with additional flat panel CT performed to confirm location. Superior hypogastric nerve block was then performed with the administration of 8 cc of 0.25% bupivacaine through each needle for a total of 16 cc. Needle was removed. Sterile bandages were placed. Patient tolerated the procedure well and remained hemodynamically stable throughout. No complications were encountered and no significant blood loss. IMPRESSION: Status post image guided superior hypogastric nerve block. Signed, Dulcy Fanny. Dellia Nims, RPVI [facility] Radiology Electronically Signed   By: Corrie Mckusick D.O.   On: 03/05/2021 08:50   CT L-SPINE NO CHARGE  Result Date: 02/25/2021 CLINICAL DATA:  Urologic cancer, staging bladder cancer with mets to bone. Prior PET-CT demonstrates Stable left retroperitoneal metastasis along the left anterior aspect of the lumbosacral junction involving the left iliopsoas musculature.  MRI demonstrates left sacral metastasis. EXAM: CT ABDOMEN AND PELVIS WITH CONTRAST CT LUMBAR  SPINE WITHOUT CONTRAST TECHNIQUE:  Multidetector CT imaging of the abdomen and pelvis was performed using the standard protocol following bolus administration of intravenous contrast. Multidetector CT imaging of the lumbar spine was performed without intravenous contrast administration. Multiplanar CT image reconstructions were also generated. CONTRAST:  44mL OMNIPAQUE IOHEXOL 350 MG/ML SOLN COMPARISON:  PET-CT 09/29/2020, MRI lumbar spine 01/04/2021, ct abdomen/pelvis 05/18/20 FINDINGS: CT abdomen/pelvis: Lower chest: Linear atelectasis versus scarring of the right lower lobe. Limited evaluation due to motion artifact. No acute abnormality. Hepatobiliary: No focal liver abnormality. No gallstones, gallbladder wall thickening, or pericholecystic fluid. No biliary dilatation. Pancreas: No focal lesion. Normal pancreatic contour. No surrounding inflammatory changes. No main pancreatic ductal dilatation. Spleen: Normal in size without focal abnormality. Adrenals/Urinary Tract: No adrenal nodule bilaterally. Bilateral kidneys enhance symmetrically. There is a 1.7 cm fluid density lesion within the right kidney that likely represents a simple renal cyst. Similar findings are noted within the left kidney measuring to 2.3 cm. Subcentimeter hypodensities are too small to characterize. No hydronephrosis. No hydroureter. Circumferential urinary bladder wall thickening. Stomach/Bowel: Stomach is within normal limits. No evidence of bowel wall thickening or dilatation. Stool throughout the colon. Appendix appears normal. 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 There is a left common iliac to external iliac to femoral vein stent. No abdominal aorta or iliac aneurysm. Severe calcified and noncalcified atherosclerotic plaque of the aorta and its branches. No abdominal, pelvic,  or inguinal lymphadenopathy. Reproductive: Prostate is unremarkable. Other: No intraperitoneal free fluid. No intraperitoneal free gas. No organized fluid collection. Musculoskeletal: Similar-appearing approximately 6 cm soft tissue density along the iliopsoas muscle with associated poorly visualized erosion of the left sacral ala (2:57). No suspicious lytic or blastic osseous lesions. No acute displaced fracture. Multilevel degenerative changes of the spine. CT lumbar spine: Segmentation: 5 lumbar type vertebrae. Alignment: Normal. Vertebrae: Mild degenerative changes. No acute fracture or focal pathologic process. Paraspinal and other soft tissues: Negative. IMPRESSION: 1. Limited evaluation of the upper abdomen, pelvis, spine due to motion artifact. 2. 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 3. Circumferential urinary bladder wall thickening. 4. Similar-appearing known left sacral al a metastasis/erosion with associated iliopsoas soft tissue density. 5. Constipation. 6. No acute displaced fracture or traumatic listhesis of the lumbar spine. Electronically Signed   By: Iven Finn M.D.   On: 02/25/2021 20:51   DG Chest Port 1 View  Result Date: 02/26/2021 CLINICAL DATA:  Confusion, weakness EXAM: PORTABLE CHEST 1 VIEW COMPARISON:  03/15/2012 FINDINGS: Right Port-A-Cath in place with the tip in the right atrium. Heart is normal size. Lungs clear. No effusions or acute bony abnormality. IMPRESSION: No active disease. Electronically Signed   By: Rolm Baptise M.D.   On: 02/26/2021 09:12   DG Hip Unilat W or Wo Pelvis 2-3 Views Left  Result Date: 02/25/2021 CLINICAL DATA:  Left hip pain EXAM: DG HIP (WITH OR WITHOUT PELVIS) 2-3V LEFT COMPARISON:  None. FINDINGS: Limited study by positioning. The patient would not lie flat for imaging. No definite visible acute bony abnormality. Degenerative  changes in the hips bilaterally. IMPRESSION: Limited study by positioning. The patient would not lie flat. No definite visible fracture. Electronically Signed   By: Rolm Baptise M.D.   On: 02/25/2021 15:36   VAS [place] LOWER EXTREMITY VENOUS (DVT) (ONLY [facility] &  [facility])  Result Date: 02/26/2021  Lower Venous DVT Study Patient Name:  SPARSH CALLENS  Date of Exam:   02/25/2021 Medical Rec #: [id number]        Accession #:    [id number] Date of Birth: 1956-01-11        Patient Gender: M Patient Age:   64 years Exam Location:  [facility] Procedure:      VAS [place] LOWER EXTREMITY VENOUS (DVT) Referring Phys: DAVID YAO --------------------------------------------------------------------------------  Indications: Swelling.  Risk Factors: Cancer DVT. Limitations: Poor ultrasound/tissue interface and patient positioning, patient constant movement, poor patient cooperation. Comparison Study: 05/12/2020 - Sonographic survey of the left lower extremity                   demonstrates stent                   changes of the common femoral vein, with persisting DVT of the                   left                   common femoral vein stent, occlusive. Positive duplex for DVT                   at the                   SFJ, junction of the profunda vein, and through the femoral                   vein                   segment. Performing Technologist: Oliver Hum RVT  Examination Guidelines: A complete evaluation includes B-mode imaging, spectral Doppler, color Doppler, and power Doppler as needed of all accessible portions of each vessel. Bilateral testing is considered an integral part of a complete examination. Limited examinations for reoccurring indications may be performed as noted. The reflux portion of the exam is performed with the patient in reverse Trendelenburg.  +-----+---------------+---------+-----------+----------+--------------+ RIGHTCompressibilityPhasicitySpontaneityPropertiesThrombus Aging  +-----+---------------+---------+-----------+----------+--------------+ CFV  Full           Yes      Yes                                 +-----+---------------+---------+-----------+----------+--------------+   +---------+---------------+---------+-----------+----------+-----------------+ LEFT     CompressibilityPhasicitySpontaneityPropertiesThrombus Aging    +---------+---------------+---------+-----------+----------+-----------------+ CFV                     No       No                   Age Indeterminate +---------+---------------+---------+-----------+----------+-----------------+ FV Prox  Partial        Yes      Yes                  Age Indeterminate +---------+---------------+---------+-----------+----------+-----------------+ FV Mid   Full                                                           +---------+---------------+---------+-----------+----------+-----------------+ FV DistalNone           No  No                   Age Indeterminate +---------+---------------+---------+-----------+----------+-----------------+ POP      Partial        Yes      Yes                  Age Indeterminate +---------+---------------+---------+-----------+----------+-----------------+ PTV      Full                                                           +---------+---------------+---------+-----------+----------+-----------------+ PERO     None                                         Age Indeterminate +---------+---------------+---------+-----------+----------+-----------------+ Gastroc  Partial                                      Age Indeterminate +---------+---------------+---------+-----------+----------+-----------------+ EIV                     No       No                   Age Indeterminate +---------+---------------+---------+-----------+----------+-----------------+ A stent is noted in the common femoral vein. Unable to assess the CIV,  or the IVC due to the patient's positioning and movement.    Summary: RIGHT: - No evidence of common femoral vein obstruction.  LEFT: - Findings consistent with age indeterminate deep vein thrombosis involving the left, external iliac vein, left common femoral vein, left femoral vein, left popliteal vein, left peroneal veins, and left gastrocnemius veins. - No cystic structure found in the popliteal fossa.  *See table(s) above for measurements and observations. Electronically signed by Orlie Pollen on 02/26/2021 at 7:26:13 PM.    Final       Subjective: Patient seen and examined at bedside.  No fever, chest pain, vomiting reported.  Still complains of intermittent pain.    Discharge Exam: Vitals:   03/08/21 0537 03/08/21 0918  BP: 136/74   Pulse: 93   Resp: 13 15  Temp: 98.4 F (36.9 C)   SpO2: 97% 97%    General exam: No distress.  Currently on room air.  Looks chronically ill and deconditioned.   Respiratory system: Decreased breath sounds at bases bilaterally with some crackles  cardiovascular system: Rate controlled, S1-S2 heard gastrointestinal system: Abdomen is mildly distended, soft and nontender.  Bowel sounds are heard  extremities: Mild lower extremity edema present; no clubbing  Central nervous system: Still slow to respond; alert.  Poor historian. No focal neurological deficits.  Moves extremities  skin: Large right-sided ecchymosis present from almost below the axilla to almost the right hip.   Psychiatry: Affect is flat      The results of significant diagnostics from this hospitalization (including imaging, microbiology, ancillary and laboratory) are listed below for reference.     Microbiology: No results found for this or any previous visit (from the past 240 hour(s)).   Labs: BNP (last 3 results) No results for input(s): BNP in the last 8760 hours. Basic Metabolic Panel:  Recent Labs  Lab 03/02/21 0515 03/03/21 0849 03/04/21 0401 03/05/21 0450  03/06/21 0401 03/07/21 0320  NA 137  --  138 135 134* 136  K 4.1  --  4.2 3.7 4.4 4.0  CL 107  --  108 101 102 103  CO2 22  --  _0 GLUCOSE 150*  --  128* 125* 158* 122*  BUN 29*  --  21 20 24* 27*  CREATININE 0.94  --  0.75 0.76 0.91 1.00  CALCIUM 7.9*  --  8.7* 8.7* 9.2 9.9  MG 1.8 1.8  --  2.1 2.2  --    Liver Function Tests: Recent Labs  Lab 03/04/21 0401 03/05/21 0450 03/06/21 0401 03/07/21 0320  AST 13* 11* 11* 8*  ALT _1 ALKPHOS 69 67 75 73  BILITOT 0.8 1.2 0.9 0.9  PROT 5.9* 5.6* 5.9* 6.1*  ALBUMIN 2.6* 2.5* 2.5* 2.6*   No results for input(s): LIPASE, AMYLASE in the last 168 hours. No results for input(s): AMMONIA in the last 168 hours. CBC: Recent Labs  Lab 03/03/21 0849 03/03/21 1631 03/04/21 0401 03/05/21 0450 03/06/21 0401 03/07/21 0320  WBC 16.4*  --  15.7* 13.2* 20.2* 18.6*  NEUTROABS  --   --  14.1* 11.9* 19.1* 16.4*  HGB 6.5* 7.4* 7.9* 7.7* 7.9* 8.1*  HCT 20.8* 23.6* 25.0* 24.2* 24.7* 25.5*  MCV 103.0*  --  99.6 99.6 99.2 101.6*  PLT 112*  --  125* 158 193 222   Cardiac Enzymes: No results for input(s): CKTOTAL, CKMB, CKMBINDEX, TROPONINI in the last 168 hours. BNP: Invalid input(s): POCBNP CBG: Recent Labs  Lab 03/04/21 0912  GLUCAP 137*   D-Dimer No results for input(s): DDIMER in the last 72 hours. Hgb A1c No results for input(s): HGBA1C in the last 72 hours. Lipid Profile No results for input(s): CHOL, HDL, LDLCALC, TRIG, CHOLHDL, LDLDIRECT in the last 72 hours. Thyroid function studies No results for input(s): TSH, T4TOTAL, T3FREE, THYROIDAB in the last 72 hours.  Invalid input(s): FREET3 Anemia work up No results for input(s): VITAMINB12, FOLATE, FERRITIN, TIBC, IRON, RETICCTPCT in the last 72 hours. Urinalysis    Component Value Date/Time   COLORURINE YELLOW 02/25/2021 1802   APPEARANCEUR CLEAR 02/25/2021 1802   LABSPEC 1.016 02/25/2021 1802   PHURINE 5.0 02/25/2021 1802   GLUCOSEU NEGATIVE 02/25/2021  1802   HGBUR SMALL (A) 02/25/2021 1802   BILIRUBINUR NEGATIVE 02/25/2021 1802   KETONESUR 20 (A) 02/25/2021 1802   PROTEINUR 30 (A) 02/25/2021 1802   NITRITE NEGATIVE 02/25/2021 1802   LEUKOCYTESUR NEGATIVE 02/25/2021 1802   Sepsis Labs Invalid input(s): PROCALCITONIN,  WBC,  LACTICIDVEN Microbiology No results found for this or any previous visit (from the past 240 hour(s)).   Time coordinating discharge: 35 minutes  SIGNED:   Aline August, MD  [facility] 03/08/2021, 10:59 AM

## 2021-03-08 NOTE — Progress Notes (Signed)
Daily Progress Note   Patient Name: Brian Benton       Date: 03/08/2021 DOB: 1955/09/19  Age: 65 y.o. MRN#: [id number] Attending Physician: Aline August, MD Primary Care Physician: [facility] Date: 02/25/2021  Reason for Consultation/Follow-up: Establishing goals of care, Pain control, and Psychosocial/spiritual support  Subjective: I saw and examined Brian Benton today.  His wife was present at the bedside.  PCA reviewed and he has used roughly 1.5mg  per hour of dilaudid for the last 24 hours.  I reviewed his PCA and called and discussed with Dr. Ronne Binning from [facility].  We discussed plan to continue with basal rate to 0.75 mg/h, and utilize bolus dose of 0.5 mg with a 15-minute lockout.  This should allow sufficient medication to control his pain on discharge.  This can certainly be further titrated by hospice based on how he is doing in the next couple of days.  Additionally, we discussed again regarding limits of care and his CODE STATUS.  He and his wife both expressed understanding of recommendation for not attempting resuscitation in the event of a natural death.  While he remains a full code here in the hospital, we made plan for enacting DNR at time of discharge from the hospital.  I did complete a durable DNR and placed on his chart.  Plan is to transition home with hospice support today once PCA can be arranged.  Length of Stay: 10  Current Medications: Scheduled Meds:  . Chlorhexidine Gluconate Cloth  6 each Topical Daily  . DULoxetine  60 mg Oral QHS  . HYDROmorphone   Intravenous Q4H  . HYDROmorphone  1 mg Intravenous Once  . pantoprazole  40 mg Oral BID  . polyethylene glycol  17 g Oral BID  . pregabalin  50 mg Oral TID  . senna   2 tablet Oral BID  . sodium chloride flush  10-40 mL Intracatheter Q12H    Continuous Infusions:     PRN Meds: acetaminophen **OR** acetaminophen, bisacodyl, diphenhydrAMINE **OR** diphenhydrAMINE, guaiFENesin, heparin lock flush, hydrALAZINE, ipratropium-albuterol, LORazepam, metoprolol tartrate, naloxone **AND** sodium chloride flush, ondansetron (ZOFRAN) IV, oxyCODONE, prochlorperazine, sodium chloride flush, sodium chloride flush, traZODone  Physical Exam         Resting in bed Appears with mild generalized weakness Has  left lower extremity ongoing discomfort Regular work of breathing Appears chronically ill  Vital Signs: BP 136/74 (BP Location: Left Arm)   Pulse 93   Temp 98.4 F (36.9 C) (Oral)   Resp 16   Ht 6' (1.829 m)   Wt 63.6 kg   SpO2 97%   BMI 19.01 kg/m  SpO2: SpO2: 97 % O2 Device: O2 Device: Room Air O2 Flow Rate: O2 Flow Rate (L/min): 0 L/min  Intake/output summary: No intake or output data in the 24 hours ending 03/08/21 1828  LBM: Last BM Date: 03/07/21 Baseline Weight: Weight: 77.1 kg Most recent weight: Weight: 63.6 kg       Palliative Assessment/Data:      Patient Active Problem List   Diagnosis Date Noted  . Pressure injury of skin 03/05/2021  . Palliative care by specialist   . Goals of care, counseling/discussion   . Left hip pain 02/26/2021  . Deep vein thrombosis (DVT) of left iliofemoral vein ([place]) 02/25/2021  . Mild protein malnutrition ([place]) 02/25/2021  . Thrombocytopenia ([place]) 02/25/2021  . Leukocytosis 02/25/2021  . IDA (iron deficiency anemia) 09/22/2020  . 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 Leg swelling 03/02/2020  . Paraspinal mass 03/02/2020  . Acute deep vein thrombosis (DVT) of left lower extremity ([place]) 03/02/2020  . Essential hypertension 10/31/2018  . 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 Mixed hyperlipidemia 10/03/2018  . NEVUS, ATYPICAL 10/23/2007  . 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  Recommendations/Plan: Pain, cancer related: Plan to discharge home today with pain management.  I called and discussed with hosp[name].  Current settings are basal rate of 0.75 mg with bolus dose of 0.5 mg with a 15-minute lockout.. Constipation, opioid related.  Continue MiraLAX.  Continue senna 2 tabs twice daily.  Continue Dulcolax suppository as needed. Plan is to transition home with hospice today.  His wife's daughter is in from [place] with her fianc and they are desperate to get home to spend time with him. Transition to full DNR at time of discharge.  Durable DNR placed on chart.  Goals of Care and Additional Recommendations: Limitations on Scope of Treatment: Plan for home with hospice today Code Status:    Code Status Orders  (From admission, onward)           Start     Ordered   02/25/21 2215  Full code  Continuous        02/25/21 2244           Code Status History     Date Active Date Inactive Code Status Order ID Comments User Context   03/13/2020 1019 03/15/2020 0515 Full Code [id number]  Elam Dutch, MD Inpatient   03/02/2020 2020 03/06/2020 1956 Full Code [id number]  Chotiner, Yevonne Aline, MD Inpatient      Advance Directive Documentation    Flowsheet Row Most Recent Value  Type of Advance Directive Healthcare Power of Attorney, Living will  Pre-existing out of facility DNR order (yellow form or pink MOST form) --  "MOST"  Form in Place? --       Prognosis:  Unable to  determine  Discharge Planning: To Be Determined   Total Time 40 Prolonged Time Billed  no    Greater than 50%  of this time was spent counseling and coordinating care related to the above assessment and plan.  Micheline Rough, MD  Please contact Palliative Medicine Team phone at [phone] for questions and concerns.

## 2021-03-08 NOTE — Progress Notes (Signed)
Pt going home via [facility]

## 2021-03-08 NOTE — Progress Notes (Signed)
Pt to his home. Dilaudid bolus given , IIV team heparinized the saline lock that accesses his portacath. Hospice provider notified of his leaving the hospital , wife at bedside and has the discharge instrictions

## 2021-03-08 NOTE — Progress Notes (Signed)
Mr. Furry had a decent weekend.  He says that the injection that he had for the neurolysis has helped.  He is still on the PCA.  I suspect he probably will go home with the PCA pump.  This might be a very effective way of trying to help control his pain.  He has probably a contraction of the left thigh.  This is always in a flexed position.  I think he needs some physical therapy to try to improve the range of motion of his left leg.  He still lies on his right side.  He is eating.  He is having no nausea or vomiting.  I do appreciate the help from Dr. Domingo Cocking for pain issues.  His labs show white cell count of 18.6.  Hemoglobin 8.1.  Platelet count 222,000.  His BUN is 27 creatinine 1.0.  His albumin is 2.6.  He has had no problems with cough or shortness of breath.  His physical exam shows a temperature of 98.4.  Pulse 93.  Blood pressure 136/74.  His lungs are clear bilaterally.  Cardiac exam regular rate and rhythm.  Abdomen is soft.  Bowel sounds are present although decreased.  There is no fluid wave.  There is no palpable abdominal mass.  There is no palpable liver or spleen tip.  Extremity shows some muscle atrophy in upper and lower extremities.  He has the flexion of the legs.  He has some tenderness over the lateral upper left thigh.  Neurological exam shows no focal neurological deficits.  Mr. Aigner had the neurolysis.  Hopefully this will have a long-term effect.  I think that he does need some physical therapy to help with his legs.  He really needs to have a little bit better range of motion.  Again, and there is no indication there is no reason to give any further chemotherapy for his cancer.  He just is in no condition for any further chemotherapy.  He has had all the standard therapy for his malignancy.  I know that he is getting great care from all the staff up on [facility] [address].  I appreciate all their hard work.  Lattie Haw, MD  Hebrew[place] 11:6

## 2021-03-08 NOTE — Plan of Care (Signed)

## 2021-03-09 ENCOUNTER — Encounter: Payer: Self-pay | Admitting: *Deleted

## 2021-03-09 ENCOUNTER — Ambulatory Visit: Payer: 59 | Admitting: Rehabilitation

## 2021-03-09 NOTE — Progress Notes (Signed)
Patient discharged from the hospital on 03/08/2021 with Hospice. Will discontinue active navigation.   Oncology Nurse Navigator Documentation  Oncology Nurse Navigator Flowsheets 03/09/2021  Abnormal Finding Date -  Confirmed Diagnosis Date -  Diagnosis Status -  Planned Course of Treatment -  Phase of Treatment -  Chemotherapy Pending- Reason: -  Chemo/Radiation Concurrent Actual Start Date: -  Navigator Follow Up Date: -  Navigator Follow Up Reason: -  Navigation Complete Date: 03/09/2021  Post Navigation: Continue to Follow Patient? No  Reason Not Navigating Patient: Hospice/Death  Navigator Location [facility]  Referral Date to RadOnc/MedOnc -  Navigator Encounter Type Appt/Treatment Plan Review  Telephone -  Treatment Initiated Date -  Patient Visit Type MedOnc  Treatment Phase Active Tx  Barriers/Navigation Needs Coordination of Care;Education  Education -  Interventions None Required  Acuity Level 2-Minimal Needs (1-2 Barriers Identified)  Referrals -  Coordination of Care -  Education Method -  Support Groups/Services Friends and Family  Time Spent with Patient 15

## 2021-03-12 ENCOUNTER — Ambulatory Visit: Payer: 59 | Admitting: Hematology & Oncology

## 2021-03-12 ENCOUNTER — Ambulatory Visit: Payer: 59

## 2021-03-12 ENCOUNTER — Other Ambulatory Visit: Payer: 59

## 2021-03-12 ENCOUNTER — Telehealth: Payer: Self-pay | Admitting: *Deleted

## 2021-03-12 NOTE — Telephone Encounter (Signed)
Call received from patient's wife, Brian Benton to inform Dr. Marin Olp that pt is not able to get out of bed and will not be coming in for today's appts.  Appts canceled and Dr. Marin Olp notified.

## 2021-03-15 ENCOUNTER — Telehealth: Payer: Self-pay | Admitting: *Deleted

## 2021-03-15 ENCOUNTER — Encounter: Payer: Self-pay | Admitting: *Deleted

## 2021-03-15 ENCOUNTER — Other Ambulatory Visit (HOSPITAL_BASED_OUTPATIENT_CLINIC_OR_DEPARTMENT_OTHER): Payer: Self-pay

## 2021-03-15 NOTE — Telephone Encounter (Signed)
Received a call from [facility], Patients wife regarding difficulty getting Eliquis refilled.  Reviewed chart and talked with Dr Marin Olp. Eliquis and Arixtra were discontinued in the hospital due to bleeding.  Dr Marin Olp does not want to refill at this time.  Attempted to call wife with this information but has a voicemail that is not set up.  Will attempt to send [place].

## 2021-03-23 ENCOUNTER — Other Ambulatory Visit (HOSPITAL_BASED_OUTPATIENT_CLINIC_OR_DEPARTMENT_OTHER): Payer: Self-pay

## 2021-04-08 ENCOUNTER — Telehealth: Payer: Self-pay | Admitting: *Deleted

## 2021-04-08 NOTE — Telephone Encounter (Signed)
Received call from Cher Nakai from [place] that patient passed away on 26-Apr-2021 at 11:42 pm. Message given to MD.

## 2021-04-13 ENCOUNTER — Encounter: Payer: Self-pay | Admitting: *Deleted

## 2021-04-13 NOTE — Progress Notes (Signed)
Condolence card mailed to patient's home.   Oncology Nurse Navigator Documentation  Oncology Nurse Navigator Flowsheets 04/13/2021  Abnormal Finding Date -  Confirmed Diagnosis Date -  Diagnosis Status -  Planned Course of Treatment -  Phase of Treatment -  Chemotherapy Pending- Reason: -  Chemo/Radiation Concurrent Actual Start Date: -  Navigator Follow Up Date: -  Navigator Follow Up Reason: -  Navigation Complete Date: -  Post Navigation: Continue to Follow Patient? -  Reason Not Navigating Patient: -  Financial planner  Referral Date to RadOnc/MedOnc -  Navigator Encounter Type Other:  Telephone -  Treatment Initiated Date -  Patient Visit Type MedOnc  Treatment Phase -  Barriers/Navigation Needs -  Education -  Interventions Psycho-Social Support  Acuity -  Referrals -  Coordination of Care -  Education Method -  Support Groups/Services -  Time Spent with Patient 15

## 2021-04-13 DEATH — deceased
